# Patient Record
Sex: Female | Born: 1950 | Race: White | Hispanic: No | Marital: Married | Smoking: Never smoker
Health system: Southern US, Community
[De-identification: ages and names within clinical notes are randomized; demographics above are authoritative.]

## PROBLEM LIST (undated history)

## (undated) DIAGNOSIS — O00109 Unspecified tubal pregnancy without intrauterine pregnancy: Secondary | ICD-10-CM

## (undated) DIAGNOSIS — H9190 Unspecified hearing loss, unspecified ear: Secondary | ICD-10-CM

## (undated) DIAGNOSIS — I5030 Unspecified diastolic (congestive) heart failure: Secondary | ICD-10-CM

## (undated) DIAGNOSIS — Z8619 Personal history of other infectious and parasitic diseases: Secondary | ICD-10-CM

## (undated) DIAGNOSIS — M779 Enthesopathy, unspecified: Secondary | ICD-10-CM

## (undated) DIAGNOSIS — R0600 Dyspnea, unspecified: Secondary | ICD-10-CM

## (undated) DIAGNOSIS — I480 Paroxysmal atrial fibrillation: Secondary | ICD-10-CM

## (undated) DIAGNOSIS — E785 Hyperlipidemia, unspecified: Secondary | ICD-10-CM

## (undated) DIAGNOSIS — R251 Tremor, unspecified: Secondary | ICD-10-CM

## (undated) DIAGNOSIS — M549 Dorsalgia, unspecified: Secondary | ICD-10-CM

## (undated) DIAGNOSIS — I425 Other restrictive cardiomyopathy: Secondary | ICD-10-CM

## (undated) DIAGNOSIS — I4891 Unspecified atrial fibrillation: Secondary | ICD-10-CM

## (undated) DIAGNOSIS — G43909 Migraine, unspecified, not intractable, without status migrainosus: Secondary | ICD-10-CM

## (undated) DIAGNOSIS — Z7901 Long term (current) use of anticoagulants: Secondary | ICD-10-CM

## (undated) DIAGNOSIS — G4733 Obstructive sleep apnea (adult) (pediatric): Secondary | ICD-10-CM

## (undated) DIAGNOSIS — Z9989 Dependence on other enabling machines and devices: Secondary | ICD-10-CM

## (undated) HISTORY — DX: Other restrictive cardiomyopathy: I42.5

## (undated) HISTORY — PX: CARDIOVERSION: SHX1299

## (undated) HISTORY — DX: Enthesopathy, unspecified: M77.9

## (undated) HISTORY — DX: Obstructive sleep apnea (adult) (pediatric): G47.33

## (undated) HISTORY — DX: Migraine, unspecified, not intractable, without status migrainosus: G43.909

## (undated) HISTORY — DX: Unspecified atrial fibrillation: I48.91

## (undated) HISTORY — PX: ROTATOR CUFF REPAIR: SHX139

## (undated) HISTORY — DX: Unspecified tubal pregnancy without intrauterine pregnancy: O00.109

## (undated) HISTORY — PX: CARDIAC CATHETERIZATION: SHX172

## (undated) HISTORY — DX: Obstructive sleep apnea (adult) (pediatric): Z99.89

## (undated) HISTORY — DX: Tremor, unspecified: R25.1

## (undated) HISTORY — DX: Unspecified diastolic (congestive) heart failure: I50.30

## (undated) HISTORY — DX: Dyspnea, unspecified: R06.00

## (undated) HISTORY — DX: Paroxysmal atrial fibrillation: I48.0

## (undated) HISTORY — DX: Unspecified hearing loss, unspecified ear: H91.90

## (undated) HISTORY — DX: Personal history of other infectious and parasitic diseases: Z86.19

## (undated) HISTORY — DX: Long term (current) use of anticoagulants: Z79.01

## (undated) HISTORY — PX: BREAST EXCISIONAL BIOPSY: SUR124

## (undated) HISTORY — PX: CHOLECYSTECTOMY: SHX55

## (undated) HISTORY — PX: EYE SURGERY: SHX253

## (undated) HISTORY — DX: Hyperlipidemia, unspecified: E78.5

## (undated) HISTORY — DX: Dorsalgia, unspecified: M54.9

## (undated) HISTORY — PX: BREAST LUMPECTOMY: SHX2

---

## 1978-01-21 HISTORY — PX: ECTOPIC PREGNANCY SURGERY: SHX613

## 1997-06-13 ENCOUNTER — Ambulatory Visit (HOSPITAL_COMMUNITY): Admission: RE | Admit: 1997-06-13 | Discharge: 1997-06-14 | Payer: Self-pay | Admitting: Surgery

## 1998-11-16 ENCOUNTER — Encounter: Payer: Self-pay | Admitting: Obstetrics and Gynecology

## 1998-11-16 ENCOUNTER — Encounter: Admission: RE | Admit: 1998-11-16 | Discharge: 1998-11-16 | Payer: Self-pay | Admitting: Obstetrics and Gynecology

## 1998-11-24 ENCOUNTER — Encounter: Payer: Self-pay | Admitting: Obstetrics and Gynecology

## 1998-11-24 ENCOUNTER — Encounter: Admission: RE | Admit: 1998-11-24 | Discharge: 1998-11-24 | Payer: Self-pay | Admitting: Obstetrics and Gynecology

## 1998-12-06 ENCOUNTER — Other Ambulatory Visit: Admission: RE | Admit: 1998-12-06 | Discharge: 1998-12-06 | Payer: Self-pay | Admitting: General Surgery

## 1999-01-26 ENCOUNTER — Ambulatory Visit (HOSPITAL_COMMUNITY): Admission: RE | Admit: 1999-01-26 | Discharge: 1999-01-26 | Payer: Self-pay | Admitting: General Surgery

## 1999-01-26 ENCOUNTER — Encounter: Payer: Self-pay | Admitting: General Surgery

## 1999-01-26 ENCOUNTER — Encounter (INDEPENDENT_AMBULATORY_CARE_PROVIDER_SITE_OTHER): Payer: Self-pay | Admitting: *Deleted

## 2000-06-04 ENCOUNTER — Encounter: Payer: Self-pay | Admitting: Obstetrics and Gynecology

## 2000-06-04 ENCOUNTER — Encounter: Admission: RE | Admit: 2000-06-04 | Discharge: 2000-06-04 | Payer: Self-pay | Admitting: Obstetrics and Gynecology

## 2000-12-16 ENCOUNTER — Encounter: Admission: RE | Admit: 2000-12-16 | Discharge: 2001-03-16 | Payer: Self-pay | Admitting: Internal Medicine

## 2001-03-17 ENCOUNTER — Encounter: Admission: RE | Admit: 2001-03-17 | Discharge: 2001-03-30 | Payer: Self-pay | Admitting: Internal Medicine

## 2003-01-20 ENCOUNTER — Encounter: Admission: RE | Admit: 2003-01-20 | Discharge: 2003-01-20 | Payer: Self-pay | Admitting: Obstetrics and Gynecology

## 2003-02-10 ENCOUNTER — Ambulatory Visit (HOSPITAL_COMMUNITY): Admission: RE | Admit: 2003-02-10 | Discharge: 2003-02-10 | Payer: Self-pay | Admitting: Obstetrics and Gynecology

## 2003-06-22 HISTORY — PX: HYSTEROSCOPY: SHX211

## 2003-06-30 ENCOUNTER — Ambulatory Visit (HOSPITAL_COMMUNITY): Admission: RE | Admit: 2003-06-30 | Discharge: 2003-06-30 | Payer: Self-pay | Admitting: Obstetrics and Gynecology

## 2003-06-30 ENCOUNTER — Encounter (INDEPENDENT_AMBULATORY_CARE_PROVIDER_SITE_OTHER): Payer: Self-pay | Admitting: Specialist

## 2003-06-30 ENCOUNTER — Ambulatory Visit (HOSPITAL_BASED_OUTPATIENT_CLINIC_OR_DEPARTMENT_OTHER): Admission: RE | Admit: 2003-06-30 | Discharge: 2003-06-30 | Payer: Self-pay | Admitting: Obstetrics and Gynecology

## 2003-11-27 ENCOUNTER — Emergency Department (HOSPITAL_COMMUNITY): Admission: EM | Admit: 2003-11-27 | Discharge: 2003-11-27 | Payer: Self-pay | Admitting: Emergency Medicine

## 2004-04-18 ENCOUNTER — Encounter: Admission: RE | Admit: 2004-04-18 | Discharge: 2004-04-18 | Payer: Self-pay | Admitting: Obstetrics and Gynecology

## 2004-04-23 ENCOUNTER — Encounter: Admission: RE | Admit: 2004-04-23 | Discharge: 2004-04-23 | Payer: Self-pay | Admitting: Obstetrics and Gynecology

## 2005-04-19 ENCOUNTER — Encounter: Admission: RE | Admit: 2005-04-19 | Discharge: 2005-04-19 | Payer: Self-pay | Admitting: Obstetrics and Gynecology

## 2005-05-10 ENCOUNTER — Encounter: Admission: RE | Admit: 2005-05-10 | Discharge: 2005-05-10 | Payer: Self-pay | Admitting: Obstetrics and Gynecology

## 2005-10-22 ENCOUNTER — Ambulatory Visit: Payer: Self-pay | Admitting: Gastroenterology

## 2005-11-04 ENCOUNTER — Ambulatory Visit: Payer: Self-pay | Admitting: Gastroenterology

## 2006-01-21 HISTORY — PX: OTHER SURGICAL HISTORY: SHX169

## 2006-04-23 ENCOUNTER — Encounter: Admission: RE | Admit: 2006-04-23 | Discharge: 2006-04-23 | Payer: Self-pay | Admitting: Obstetrics and Gynecology

## 2006-09-17 ENCOUNTER — Inpatient Hospital Stay (HOSPITAL_COMMUNITY): Admission: AD | Admit: 2006-09-17 | Discharge: 2006-09-21 | Payer: Self-pay | Admitting: Cardiovascular Disease

## 2006-09-25 ENCOUNTER — Ambulatory Visit (HOSPITAL_COMMUNITY): Admission: RE | Admit: 2006-09-25 | Discharge: 2006-09-25 | Payer: Self-pay | Admitting: Cardiovascular Disease

## 2006-10-22 ENCOUNTER — Encounter: Payer: Self-pay | Admitting: Internal Medicine

## 2006-10-26 ENCOUNTER — Encounter: Payer: Self-pay | Admitting: Internal Medicine

## 2006-11-11 ENCOUNTER — Encounter: Payer: Self-pay | Admitting: Internal Medicine

## 2006-11-21 ENCOUNTER — Ambulatory Visit: Payer: Self-pay | Admitting: Internal Medicine

## 2006-11-26 ENCOUNTER — Ambulatory Visit: Payer: Self-pay

## 2006-12-04 LAB — TSH: TSH: 2.13 u[IU]/mL (ref <=5.90)

## 2006-12-16 ENCOUNTER — Telehealth (INDEPENDENT_AMBULATORY_CARE_PROVIDER_SITE_OTHER): Payer: Self-pay | Admitting: *Deleted

## 2006-12-17 ENCOUNTER — Encounter: Payer: Self-pay | Admitting: Internal Medicine

## 2006-12-17 ENCOUNTER — Ambulatory Visit (HOSPITAL_COMMUNITY): Admission: RE | Admit: 2006-12-17 | Discharge: 2006-12-17 | Payer: Self-pay | Admitting: Cardiovascular Disease

## 2006-12-17 ENCOUNTER — Ambulatory Visit: Payer: Self-pay | Admitting: Internal Medicine

## 2006-12-19 ENCOUNTER — Ambulatory Visit: Payer: Self-pay | Admitting: Internal Medicine

## 2006-12-27 DIAGNOSIS — I4819 Other persistent atrial fibrillation: Secondary | ICD-10-CM | POA: Insufficient documentation

## 2006-12-27 DIAGNOSIS — G43909 Migraine, unspecified, not intractable, without status migrainosus: Secondary | ICD-10-CM | POA: Insufficient documentation

## 2006-12-27 DIAGNOSIS — R0602 Shortness of breath: Secondary | ICD-10-CM | POA: Insufficient documentation

## 2006-12-27 DIAGNOSIS — I4891 Unspecified atrial fibrillation: Secondary | ICD-10-CM | POA: Insufficient documentation

## 2006-12-27 DIAGNOSIS — G2581 Restless legs syndrome: Secondary | ICD-10-CM | POA: Insufficient documentation

## 2007-01-13 ENCOUNTER — Telehealth: Payer: Self-pay | Admitting: Internal Medicine

## 2007-02-04 ENCOUNTER — Encounter: Payer: Self-pay | Admitting: Internal Medicine

## 2007-02-05 ENCOUNTER — Ambulatory Visit: Payer: Self-pay | Admitting: Internal Medicine

## 2007-02-05 LAB — CONVERTED CEMR LAB
Basophils Relative: 0.1 % (ref 0.0–1.0)
Eosinophils Absolute: 0.2 10*3/uL (ref 0.0–0.6)
HCT: 38.4 % (ref 36.0–46.0)
Lymphocytes Relative: 16.9 % (ref 12.0–46.0)
MCV: 87 fL (ref 78.0–100.0)
Neutro Abs: 9 10*3/uL — ABNORMAL HIGH (ref 1.4–7.7)
Neutrophils Relative %: 77.8 % — ABNORMAL HIGH (ref 43.0–77.0)
Platelets: 312 10*3/uL (ref 150–400)
RBC: 4.41 M/uL (ref 3.87–5.11)
WBC: 11.5 10*3/uL — ABNORMAL HIGH (ref 4.5–10.5)

## 2007-02-13 ENCOUNTER — Telehealth (INDEPENDENT_AMBULATORY_CARE_PROVIDER_SITE_OTHER): Payer: Self-pay | Admitting: *Deleted

## 2007-03-02 ENCOUNTER — Telehealth (INDEPENDENT_AMBULATORY_CARE_PROVIDER_SITE_OTHER): Payer: Self-pay | Admitting: *Deleted

## 2007-03-04 ENCOUNTER — Encounter: Payer: Self-pay | Admitting: Internal Medicine

## 2007-03-12 ENCOUNTER — Ambulatory Visit: Payer: Self-pay | Admitting: Internal Medicine

## 2007-03-12 LAB — CONVERTED CEMR LAB
Anti Nuclear Antibody(ANA): NEGATIVE
Basophils Relative: 1 % (ref 0.0–1.0)
Eosinophils Relative: 2.2 % (ref 0.0–5.0)
IgE (Immunoglobulin E), Serum: 2 intl units/mL (ref 0.0–180.0)
MCHC: 32.3 g/dL (ref 30.0–36.0)
Monocytes Relative: 5.5 % (ref 3.0–11.0)
RBC: 4.54 M/uL (ref 3.87–5.11)
Sed Rate: 56 mm/hr — ABNORMAL HIGH (ref 0–25)
WBC: 9.7 10*3/uL (ref 4.5–10.5)

## 2007-04-15 ENCOUNTER — Telehealth: Payer: Self-pay | Admitting: Internal Medicine

## 2007-04-24 ENCOUNTER — Encounter: Admission: RE | Admit: 2007-04-24 | Discharge: 2007-04-24 | Payer: Self-pay | Admitting: Internal Medicine

## 2007-05-01 ENCOUNTER — Encounter: Payer: Self-pay | Admitting: Internal Medicine

## 2007-05-15 ENCOUNTER — Encounter: Payer: Self-pay | Admitting: Endocrinology

## 2007-05-20 ENCOUNTER — Encounter: Payer: Self-pay | Admitting: Internal Medicine

## 2007-05-28 ENCOUNTER — Encounter (HOSPITAL_COMMUNITY): Admission: RE | Admit: 2007-05-28 | Discharge: 2007-07-22 | Payer: Self-pay | Admitting: Cardiovascular Disease

## 2007-08-11 ENCOUNTER — Encounter: Payer: Self-pay | Admitting: Internal Medicine

## 2007-09-07 ENCOUNTER — Encounter: Payer: Self-pay | Admitting: Internal Medicine

## 2007-10-22 ENCOUNTER — Ambulatory Visit (HOSPITAL_BASED_OUTPATIENT_CLINIC_OR_DEPARTMENT_OTHER): Admission: RE | Admit: 2007-10-22 | Discharge: 2007-10-22 | Payer: Self-pay | Admitting: Orthopedic Surgery

## 2008-04-25 ENCOUNTER — Encounter: Admission: RE | Admit: 2008-04-25 | Discharge: 2008-04-25 | Payer: Self-pay | Admitting: Internal Medicine

## 2008-05-24 ENCOUNTER — Other Ambulatory Visit: Admission: RE | Admit: 2008-05-24 | Discharge: 2008-05-24 | Payer: Self-pay | Admitting: Obstetrics & Gynecology

## 2008-11-10 DIAGNOSIS — R251 Tremor, unspecified: Secondary | ICD-10-CM | POA: Insufficient documentation

## 2009-04-27 ENCOUNTER — Encounter: Admission: RE | Admit: 2009-04-27 | Discharge: 2009-04-27 | Payer: Self-pay | Admitting: Obstetrics & Gynecology

## 2009-06-08 ENCOUNTER — Telehealth (INDEPENDENT_AMBULATORY_CARE_PROVIDER_SITE_OTHER): Payer: Self-pay | Admitting: *Deleted

## 2009-06-29 ENCOUNTER — Encounter: Admission: RE | Admit: 2009-06-29 | Discharge: 2009-06-29 | Payer: Self-pay | Admitting: Internal Medicine

## 2009-08-30 ENCOUNTER — Ambulatory Visit: Payer: Self-pay | Admitting: Cardiovascular Disease

## 2009-08-31 ENCOUNTER — Ambulatory Visit: Payer: Self-pay | Admitting: Cardiovascular Disease

## 2009-08-31 LAB — HEPATIC FUNCTION PANEL: Alkaline Phosphatase: 132 U/L — AB (ref 25–125)

## 2009-09-01 ENCOUNTER — Ambulatory Visit: Payer: Self-pay | Admitting: Cardiovascular Disease

## 2009-09-01 ENCOUNTER — Ambulatory Visit (HOSPITAL_COMMUNITY): Admission: RE | Admit: 2009-09-01 | Discharge: 2009-09-01 | Payer: Self-pay | Admitting: Cardiovascular Disease

## 2009-10-12 ENCOUNTER — Ambulatory Visit: Payer: Self-pay | Admitting: Internal Medicine

## 2009-10-12 DIAGNOSIS — I5032 Chronic diastolic (congestive) heart failure: Secondary | ICD-10-CM | POA: Insufficient documentation

## 2009-10-12 DIAGNOSIS — I5030 Unspecified diastolic (congestive) heart failure: Secondary | ICD-10-CM | POA: Insufficient documentation

## 2009-10-13 ENCOUNTER — Encounter: Payer: Self-pay | Admitting: Internal Medicine

## 2009-11-24 ENCOUNTER — Telehealth: Payer: Self-pay | Admitting: Internal Medicine

## 2009-12-11 ENCOUNTER — Telehealth: Payer: Self-pay | Admitting: Internal Medicine

## 2009-12-11 ENCOUNTER — Ambulatory Visit: Payer: Self-pay | Admitting: Cardiology

## 2009-12-11 ENCOUNTER — Inpatient Hospital Stay (HOSPITAL_COMMUNITY): Admission: AD | Admit: 2009-12-11 | Discharge: 2009-12-14 | Payer: Self-pay | Admitting: Internal Medicine

## 2010-01-02 ENCOUNTER — Ambulatory Visit: Payer: Self-pay | Admitting: Cardiovascular Disease

## 2010-02-11 ENCOUNTER — Encounter: Payer: Self-pay | Admitting: Obstetrics and Gynecology

## 2010-02-19 ENCOUNTER — Encounter: Payer: Self-pay | Admitting: Cardiovascular Disease

## 2010-02-19 DIAGNOSIS — G43909 Migraine, unspecified, not intractable, without status migrainosus: Secondary | ICD-10-CM | POA: Insufficient documentation

## 2010-02-19 DIAGNOSIS — E785 Hyperlipidemia, unspecified: Secondary | ICD-10-CM | POA: Insufficient documentation

## 2010-02-19 DIAGNOSIS — R0600 Dyspnea, unspecified: Secondary | ICD-10-CM | POA: Insufficient documentation

## 2010-02-19 DIAGNOSIS — Z7901 Long term (current) use of anticoagulants: Secondary | ICD-10-CM | POA: Insufficient documentation

## 2010-02-20 NOTE — Letter (Signed)
Summary: PFT  PFT   Imported By: Marylou Mccoy 12/27/2009 09:35:14  _____________________________________________________________________  External Attachment:    Type:   Image     Comment:   External Document

## 2010-02-20 NOTE — Progress Notes (Signed)
  Phone Note Other Incoming   Request: Send information Summary of Call: [place] medical release form received from patient request records from 2008-2011 be sent to Dr. Lonn Georgia. Request forwarded to [facility].

## 2010-02-20 NOTE — Assessment & Plan Note (Signed)
Summary: NEP. AFIB/ PT HAS BCBS.GD   Visit Type:  Initial Consult Primary Marylee Belzer:  [place]/ Nahser  CC:  no sob since cardioversion in August 2011....  History of Present Illness: Mrs. Waheed is seen at the request of Dr. Elease Hashimoto   for issues related to atrial fibrillation.  This first presented in 2009 and she ended up on Rhtymol which has been mostly ineffective.. Most recently she had worsening of her symptoms of dyspnea on exertion (see below) and she is found to be in atrial flutter. She was cardioverted with a marked improvement in her symptoms. She has had subsequent deterioration in her symptoms but she remains much improved still.  Her dyspnea has been a major issue. She has had extensive pulmonary evaluation for this without clear diagnosis. She underwent cardiopulmonary stress testing that showed some dead space ventilation she is. She is been followed by Dr. Maple Hudson and treated with a variety of inhalers largely without benefit.  At some point she was found to have diastolic dysfunction and underwent a biopsy at [facility] to exclude sarcoid. We have no further information available to [place] today as to her cardiac function or diagnoses.    Current Medications (verified): 1)  Lipitor 20 Mg  Tabs (Atorvastatin Calcium) .... Take 1 Tab By Mouth At Bedtime 2)  Topamax 50 Mg  Tabs (Topiramate) .... Take 1 By Mouth Once Daily 3)  Adult Aspirin Low Strength 81 Mg  Tbdp (Aspirin) .... Take 1 Tablet By Mouth Once A Day 4)  Furosemide 40 Mg  Tabs (Furosemide) .... Take 1 Tablet By Mouth Once A Day 5)  Pradaxa 150 Mg Caps (Dabigatran Etexilate Mesylate) .... Two Times A Day 6)  Potassium Chloride Cr 10 Meq Cr-Caps (Potassium Chloride) .... Take One Tablet By Mouth Daily  Allergies: 1)  ! Pcn 2)  ! Codeine  Past History:  Past Medical History: Last updated: 03/12/2007 Dyspnea Migraine Atrial fibrillation- paroxysmal diastolic dysfunction  Family History: Last updated: 10/11/2009 er  father has a history of hypertension.  Her mother   has a history of atrial fibrillation.   Social History: Last updated: 10/11/2009 The patient works as an Tax inspector.  She does   not smoke and does not drink alcohol.      Past Surgical History: colonoscopy shoulder surgery x2 Cholecystectomy Tubal pregnancy Breast lumpectomy  Vital Signs:  Patient profile:   60 year old female Height:      69 inches Weight:      231 pounds BMI:     34.24 Pulse rate:   70 / minute Pulse rhythm:   irregular BP sitting:   104 / 78  (left arm)  Vitals Entered By: Laurance Flatten CMA (October 12, 2009 5:24 PM)  Physical Exam  General:  Well developed, well nourished,middle-aged Caucasian female appearing her stated age in no acute distress. Head:  normal HEENT Neck:  flat neck veins carotid upstrokes normal neck supple Chest Wall:  without kyphosis or scoliosis Lungs:  clear to auscultation Heart:  regular rate and rhythm with an S4 Abdomen:  soft nontender without hepatomegaly or midline pulsation Msk:  normal muscle strength and no skeletal abnormalities Pulses:  intact pulses Extremities:  no clubbing cyanosis or edema Neurologic:  alert and oriented and grossly normal motor and sensory function Skin:  warm and dry Cervical Nodes:  without adenopathy Psych:  intact affect   EKG  Procedure date:  10/12/2009  Findings:      sinus rhythm at 70 Intervals 0.13/2007/0.40  Axis is 50 Otherwise normal  EKG  Procedure date:  09/01/2009  Findings:      atypical atrial flutter with a cycle length of 320 ms with negative flutter waves in lead V1 23 and F.  Impression & Recommendations:  Problem # 1:  ATRIAL FIBRILLATION (ICD-427.31) the patient has had symptomatic recurrent atrial fibrillation. Apparently he has left ventricular hypertrophy which makes a 1C agent contraindicated.  She has some recollection that she was on amiodarone in the past.  Thus our best options  for her would include Tikosyn. Her QT interval is fairly short enough to allow this. I would also think about catheter ablation. However, given the issues of diastolic dysfunction and question restrictive cardiomyopathy the potential benefits of PVI may be lessened and I would try another drug first. Her updated medication list for this problem includes:    Adult Aspirin Low Strength 81 Mg Tbdp (Aspirin) .[name]... Take 1 tablet by mouth once a day  Problem # 2:  DYSPNEA (ICD-786.05) her dyspnea is a major issue. Is not clearly whether this is cardiac or not. I I wonder whether having her see Dr.kitzman at [facility] not be of value to try to understand the contribution of her diastolic dysfunction to her symptom complex. I will also seek to get Dr.DB  input on this. Her updated medication list for this problem includes:    Adult Aspirin Low Strength 81 Mg Tbdp (Aspirin) .[name]... Take 1 tablet by mouth once a day    Furosemide 40 Mg Tabs (Furosemide) .[name]... Take 1 tablet by mouth once a day  Patient Instructions: 1)  Will call after Dr. Graciela Husbands consults with Dr. Melburn Popper for possible admit for Tikosyn load and possible referals.

## 2010-02-20 NOTE — Letter (Signed)
Summary: [facility] - Cath   Imported By: Marylou Mccoy 12/27/2009 09:42:20  _____________________________________________________________________  External Attachment:    Type:   Image     Comment:   External Document

## 2010-02-20 NOTE — Letter (Signed)
Summary: [name] - New Patient [facility] - New Patient Eval   Imported By: Marylou Mccoy 12/27/2009 09:38:22  _____________________________________________________________________  External Attachment:    Type:   Image     Comment:   External Document

## 2010-02-20 NOTE — Miscellaneous (Signed)
  Clinical Lists Changes  Medications: Added new medication of METOPROLOL SUCCINATE 50 MG XR24H-TAB (METOPROLOL SUCCINATE) Take one tablet by mouth daily Added new medication of AMITRIPTYLINE HCL 50 MG TABS (AMITRIPTYLINE HCL) once daily Allergies: Added new allergy or adverse reaction of * OXYCODONE Observations: Added new observation of ALLERGY REV: Done (10/13/2009 16:24)

## 2010-02-20 NOTE — Progress Notes (Signed)
Summary: re tikosyn  Phone Note Call from Patient   Caller: Patient [phone] can be reached after 510p or leave msg w/husband Reason for Call: Talk to Nurse Summary of Call: pt calling re tikosyn , thought she was to be scheduled to have it done at the hospital and hasn't heard anything since her visit 9-22 Initial call taken by: Glynda Jaeger,  November 24, 2009 4:32 PM  Follow-up for Phone Call        I called and spoke with pt's husband regarding hospital admission for Tikosyn load. On the last MD note it states: DR. Graciela Husbands WOULD CONSULT WITH DR. Melburn Popper FOR POSSIBLE TIKOSYN LOAD. I let pt. know will send this message to the  MD's desktop. She/he will probable be call  next week. Pt's husband verbalized understanding. Follow-up by: Ollen Gross, RN, BSN,  November 24, 2009 5:15 PM  Additional Follow-up for Phone Call Additional follow up Details #1::        line busy 11/27/09 0900 Claris Gladden, RN, BSN spoke w/Ms. Gunkel and told her that Tikosyn may not be the first option and that Dr. Graciela Husbands may discuss her case w/other MD's. Will f/u w/him this pm and adv pt tomorrow.  Additional Follow-up by: Claris Gladden RN,  November 27, 2009 9:31 AM    Additional Follow-up for Phone Call Additional follow up Details #2::    Per Dr. Graciela Husbands proceed w/Tikosyn load. Ms. Gheen will review dates and discuss w/husband and let me know on Monday.  Follow-up by: Claris Gladden RN,  November 30, 2009 3:28 PM  Additional Follow-up for Phone Call Additional follow up Details #3:: Details for Additional Follow-up Action Taken: 11/14 1014 [id number] Claris Gladden, RN, BSN date scheduled for 11/21 Additional Follow-up by: Claris Gladden RN,  December 04, 2009 12:47 PM

## 2010-02-20 NOTE — Progress Notes (Signed)
Summary: to go to hospital today-when?  Phone Note Call from Patient   Caller: Patient [phone] Reason for Call: Talk to Nurse Summary of Call: pt to go to hopital today and hasn't heard from them, calling to get info on when to be there Initial call taken by: Glynda Jaeger,  December 11, 2009 9:30 AM  Follow-up for Phone Call        adv pt would try and find out something.  Follow-up by: Claris Gladden RN,  December 11, 2009 10:15 AM  Additional Follow-up for Phone Call Additional follow up Details #1::        [facility] admitting adv [name] is first one up to get a bed when one becomes available. Adv pt of info. No ETA  Additional Follow-up by: Claris Gladden RN,  December 11, 2009 10:34 AM

## 2010-04-04 LAB — BASIC METABOLIC PANEL
BUN: 16 mg/dL (ref 6–23)
BUN: 18 mg/dL (ref 6–23)
Calcium: 9 mg/dL (ref 8.4–10.5)
Calcium: 9.1 mg/dL (ref 8.4–10.5)
Chloride: 111 mEq/L (ref 96–112)
Creatinine, Ser: 0.92 mg/dL (ref 0.4–1.2)
Creatinine, Ser: 1.06 mg/dL (ref 0.4–1.2)
GFR calc Af Amer: >60 mL/min (ref >60)
GFR calc Af Amer: >60 mL/min (ref >60)
GFR calc non Af Amer: 53 mL/min — ABNORMAL LOW (ref >60)
GFR calc non Af Amer: 55 mL/min — ABNORMAL LOW (ref >60)
GFR calc non Af Amer: 59 mL/min — ABNORMAL LOW (ref >60)
Glucose, Bld: 95 mg/dL (ref 70–99)
Glucose, Bld: 95 mg/dL (ref 70–99)
Glucose, Bld: 96 mg/dL (ref 70–99)
Potassium: 3.5 mEq/L (ref 3.5–5.1)
Potassium: 3.8 mEq/L (ref 3.5–5.1)
Sodium: 140 mEq/L (ref 135–145)
Sodium: 140 mEq/L (ref 135–145)
Sodium: 141 mEq/L (ref 135–145)
Sodium: 141 mEq/L (ref 135–145)

## 2010-04-04 LAB — MAGNESIUM
Magnesium: 2.3 mg/dL (ref 1.5–2.5)
Magnesium: 2.4 mg/dL (ref 1.5–2.5)

## 2010-04-06 ENCOUNTER — Ambulatory Visit: Payer: Self-pay | Admitting: Cardiovascular Disease

## 2010-04-12 ENCOUNTER — Other Ambulatory Visit: Payer: Self-pay | Admitting: Cardiovascular Disease

## 2010-04-12 ENCOUNTER — Ambulatory Visit (INDEPENDENT_AMBULATORY_CARE_PROVIDER_SITE_OTHER): Payer: BC Managed Care – PPO | Admitting: Cardiovascular Disease

## 2010-04-12 ENCOUNTER — Encounter: Payer: Self-pay | Admitting: Cardiovascular Disease

## 2010-04-12 ENCOUNTER — Other Ambulatory Visit: Payer: Self-pay | Admitting: Obstetrics & Gynecology

## 2010-04-12 VITALS — BP 120/82 | HR 80 | Wt 235.0 lb

## 2010-04-12 DIAGNOSIS — I4891 Unspecified atrial fibrillation: Secondary | ICD-10-CM

## 2010-04-12 DIAGNOSIS — I509 Heart failure, unspecified: Secondary | ICD-10-CM

## 2010-04-12 DIAGNOSIS — E785 Hyperlipidemia, unspecified: Secondary | ICD-10-CM

## 2010-04-12 DIAGNOSIS — I519 Heart disease, unspecified: Secondary | ICD-10-CM

## 2010-04-12 DIAGNOSIS — I5189 Other ill-defined heart diseases: Secondary | ICD-10-CM

## 2010-04-12 DIAGNOSIS — R0609 Other forms of dyspnea: Secondary | ICD-10-CM

## 2010-04-12 DIAGNOSIS — Z7901 Long term (current) use of anticoagulants: Secondary | ICD-10-CM

## 2010-04-12 DIAGNOSIS — I5032 Chronic diastolic (congestive) heart failure: Secondary | ICD-10-CM

## 2010-04-12 DIAGNOSIS — Z1231 Encounter for screening mammogram for malignant neoplasm of breast: Secondary | ICD-10-CM

## 2010-04-12 DIAGNOSIS — R0600 Dyspnea, unspecified: Secondary | ICD-10-CM

## 2010-04-12 DIAGNOSIS — R0989 Other specified symptoms and signs involving the circulatory and respiratory systems: Secondary | ICD-10-CM

## 2010-04-12 NOTE — Assessment & Plan Note (Signed)
We will continue with her same medications. She is a little bit confused about her potassium dosing. She'll continue with her current dose of Lasix and we will check a basic metabolic profile today.

## 2010-04-12 NOTE — Assessment & Plan Note (Signed)
She will continue on Pradaxa. She is tolerating this very well.

## 2010-04-12 NOTE — Patient Instructions (Signed)
Continue same medications.  Please call with potassium dose if it not 20 meq twice a day.

## 2010-04-12 NOTE — Assessment & Plan Note (Signed)
Have asked her to exercise on a regular basis. She would improve with weight loss.

## 2010-04-12 NOTE — Assessment & Plan Note (Signed)
We will check a lipid profile and CMET with her next visit in 6 months.

## 2010-04-12 NOTE — Progress Notes (Signed)
History of Present Illness:  [name] is a middle-aged female with the history of chronic dyspnea, H. Fibrillation, diastolic heart failure, mild obesity, and restrictive lung disease. She's not had any severe problems recently. She has stayed in sinus rhythm and is doing quite well on Tikosyn.  Current Outpatient Prescriptions on File Prior to Visit  Medication Sig Dispense Refill  . Albuterol (PROVENTIL IN) Inhale into the lungs as needed.        [name] aspirin 81 MG tablet Take 81 mg by mouth daily.        [name] atorvastatin (LIPITOR) 20 MG tablet Take 20 mg by mouth daily.        . Dabigatran Etexilate Mesylate (PRADAXA) 150 MG CAPS Take 150 mg by mouth every 12 (twelve) hours.        . dofetilide (TIKOSYN) 125 MCG capsule Take 375 mcg by mouth 2 (two) times daily.        . Ergotamine-Caffeine (CAFERGOT PO) Take by mouth as needed.        . ergotamine-caffeine (CAFERGOT) 1-100 MG per tablet Take 2 tablets by mouth as needed. Two tablets at onset of attack; then 1 tablet every 30 minutes as needed; maximum: 6 tablets per attack; do not exceed 10 tablets/week       . furosemide (LASIX) 40 MG tablet Take 40 mg by mouth 2 (two) times daily.        . metoclopramide (REGLAN) 10 MG tablet Take 10 mg by mouth as needed.        . potassium chloride SA (K-DUR,KLOR-CON) 20 MEQ tablet Take 20 mEq by mouth 2 (two) times daily.        [name] topiramate (TOPAMAX) 25 MG capsule Take 25 mg by mouth daily.          Allergies  Allergen Reactions  . Codeine     VERTIGO, NAUSEA & VOMITTING  . Oxycodone   . Penicillins Rash    Past Medical History  Diagnosis Date  . Paroxysmal atrial fibrillation   . Migraine headache   . Dyspnea   . Restrictive cardiomyopathy   . Diastolic congestive heart failure   . Hyperlipemia   . Chronic anticoagulation     Past Surgical History  Procedure Date  . Cardiac catheterization   . Cardioversion   . Rotator cuff repair   . Ectopic pregnancy surgery   . Cholecystectomy      History  Smoking status  . Never Smoker   Smokeless tobacco  . Not on file    History  Alcohol Use: Not on file    Family History  Problem Relation Age of Onset  . Hypertension Father   . Atrial fibrillation Mother     Review of Systems: The patient denies any heat or cold intolerance.  No weight gain or weight loss.  The patient denies headaches or blurry vision.  There is no cough or sputum production.  The patient denies dizziness.  There is no hematuria or hematochezia.  The patient denies any muscle aches or arthritis.  The patient denies any rash.  The patient denies frequent falling or instability.  There is no history of depression or anxiety.  All other systems were reviewed and are negative.  Physical Exam: BP 120/82  Pulse 80  Wt 235 lb (106.595 kg) The patient is alert and oriented x 3.  The mood and affect are normal.  The skin is warm and dry.  Color is normal.  The HEENT exam reveals that  the sclera are nonicteric.  The mucous membranes are moist.  The carotids are 2+ without bruits.  There is no thyromegaly.  There is no JVD.  The lungs are clear.  The chest wall is non tender.  The heart exam reveals a regular rate with a normal S1 and S2.  There is a soft murmur.  The PMI is not displaced.   Abdominal exam reveals good bowel sounds.  There is no guarding or rebound.  There is no hepatosplenomegaly or tenderness.  There are no masses.  Exam of the legs reveal no clubbing, cyanosis, or edema.  The legs are without rashes.  The distal pulses are intact.  Cranial nerves II - XII are intact.  Motor and sensory functions are intact.  The gait is normal.  ECG: Normal sinus rhythm. The QT C. Is 429 ms.  Assessment / Plan:

## 2010-04-12 NOTE — Assessment & Plan Note (Signed)
[name] is doing quite well from an atrial fibrillation standpoint. She's tolerated Tikosyn  quite well and her EKG is normal. We will continue with this same medications

## 2010-04-13 LAB — BASIC METABOLIC PANEL
BUN: 23 mg/dL (ref 6–23)
Calcium: 9.6 mg/dL (ref 8.4–10.5)
Creat: 0.99 mg/dL (ref 0.40–1.20)
Potassium: 4.8 mEq/L (ref 3.5–5.3)

## 2010-04-30 ENCOUNTER — Ambulatory Visit
Admission: RE | Admit: 2010-04-30 | Discharge: 2010-04-30 | Disposition: A | Discharge status: Home / Self Care | Payer: BC Managed Care – PPO | Source: Ambulatory Visit | Attending: Obstetrics & Gynecology | Admitting: Obstetrics & Gynecology

## 2010-04-30 DIAGNOSIS — Z1231 Encounter for screening mammogram for malignant neoplasm of breast: Secondary | ICD-10-CM

## 2010-06-05 NOTE — Assessment & Plan Note (Signed)
[facility]                             PULMONARY OFFICE NOTE   NAME:BURNEYVerlin, [name]                  MRN:          [id number]  DATE:11/21/2006                            DOB:          1950/06/15    PROBLEM:  Pulmonary consultation at the kind request of Gwen Pounds,  M.D. for this 60 year old woman who complains of chest pain and  shortness of breath.  She says for five years she has been in and out of  atrial fibrillation treated by Vesta Mixer, M.D. and on Coumadin.  In August of this year, she was in atrial fibrillation through most of  the month and spontaneously converted just as she was about to go for  cardioversion.  She has remained weaker and more short of breath and she  thinks it is usual for her.  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  She was referred to [facility] for a heart  biopsy where they also did a sleep study.  She said she had minimal  sleep apnea and did not qualify for CPAP.  Her main concern is the  persistent weakness and shortness of breath.  CT scans with and without  contrast were done at [facility].  The contrast CT study done September  4, showed no active disease.  This was not a pulmonary embolism  protocol.  They described resolution of pericardial effusion and pleural  effusions which had been present on August 29, when she was in atrial  fibrillation.  She says another heart catheterization and biopsy done at  [facility] were normal.  She took that to mean there was nothing wrong  with her heart and I pointed out the simple fact that she had been going  in and out of atrial fibrillation over the years and that things were  not quite normal.  She is for pulmonary evaluation now because of  shortness of breath, chest pain, and fatigue, all new since August.  She  says her oxygen saturation dropped when she was  retaining fluid.  She  tends to rapidly retain fluid if she misses Lasix and feels this as a  heavy sensation in the chest.   MEDICATIONS:  1. Lipitor 20 mg.  2. Topamax 100 mg.  3. Aspirin 81 mg.  4. Warfarin.  5. Furosemide 40 mg.  6. Nadolol is taken p.r.n. and currently she is off of it.  7. Cafergot every four hours p.r.n. migraine.  8. Metoclopramide 10 mg.   ALLERGIES:  PENICILLIN, CODEINE.  No problems with latex, contrast dye,  or aspirin.   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  She does not wake at night  smothered, but prefers to sleep in a recliner if she is at all  uncomfortable.  Otherwise, she will sleep on one pillow.  This heavy  smothering sensation is what she means by chest pain.  Occasional  palpitation.  Occasional indigestion.  She is not sure  about acid  indigestion, weight change, or anxiety.   PAST MEDICAL HISTORY:  Atrial fibrillation, chronic migraine, very mild  sleep apnea with restless leg syndrome diagnosed at [facility].  No history  of asthma or previous lung disease.  No history of coronary disease.  No  history of deep venous thrombosis or clotting disorder, leg fracture, or  thyroid disease.  Surgery for cholecystectomy and shoulder.   SOCIAL HISTORY:  Never smoked.  Married, no children, lives with  husband.  Works as an Tax inspector.   FAMILY HISTORY:  Nobody with clotting disorder or lung disease.  Mother  and sister with atrial fibrillation.  Father with hypertension.  Sister  had mastectomy.  Grandmother had cancer of mouth.   OBJECTIVE:  VITAL SIGNS:  Weight 225 pounds.  Blood pressure 116/72,  pulse 60, room air saturation 98%.  GENERAL:  This is a heavy woman quite alert in no evident distress.  SKIN:  No evident rash.  ADENOPATHY:  None found.  HEENT:  Palate spacing 3-4/4.  No stridor or thyromegaly.  CHEST:  Quiet, clear lung fields.  HEART:  Sounds  regular without murmur or gallops.  NECK:  No neck vein distention, stridor, peripheral edema, cyanosis, or  clubbing.   VACCINE STATUS:  She does not know about Pneumococcal vaccine.  Has had  flu shot.   PHYSICAL EXAMINATION:  VITAL SIGNS:  Today weight 225 pounds, blood  pressure 116/72, pulse 60 and regular, room air saturation 98%.   IMPRESSION:  1. Dyspnea which she dates to her most recent episode of atrial      fibrillation.  Unless she has had a pulmonary embolism, it is not      clear why there should have been a connection as long as she has      not been retaining more fluid than usual or had exercise tolerance      suppressed by medication.  2. Paroxysmal atrial fibrillation which is associated with pericardial      effusion and pleural effusion, supporting impression of a heart      failure component at that time.  3. She has questioned a developing varix in the left calf, though it      does not bother her much and we talked about the importance of      excluding pulmonary embolism even though there is no direct      evidence.  4. We questioned the exercise tolerance reduction attributable to      nadolol and she is going to discuss that with her physicians to see      whether it is permissible now to stop.   PLAN:  1. D-dimer and Doppler of leg veins rather than repeating another CT      scan at this time.  2. Pulmonary function test to include six-minute walk test.  3. She will ask her physicians about her thyroid status.  4. Come off of nadolol if possible to assess impact on exercise      tolerance and exertional dyspnea.  5. She will schedule return for follow-up after these tests are      completed and try to walk some for endurance.     Clinton D. Maple Hudson, MD, Tonny Bollman, FACP  Electronically Signed    CDY/MedQ  DD: 11/23/2006  DT: 11/24/2006  Job #: [id number]   cc:   Gwen Pounds, MD  Vesta Mixer, M.D.

## 2010-06-05 NOTE — H&P (Signed)
NAMEKYLEN, ISMAEL NO.:  [id number]   MEDICAL RECORD NO.:  [id number]           PATIENT TYPE:   LOCATION:                                 FACILITY:   PHYSICIAN:  Vesta Mixer, M.D.      DATE OF BIRTH:   DATE OF ADMISSION:  DATE OF DISCHARGE:                              HISTORY & PHYSICAL   [name] is a middle-aged female with a history of intermittent  atrial fibrillation.  She is now admitted for TE cardioversion after  having sustained atrial fibrillation.   [name] is a middle-aged female who has been in relatively good health.  She has had intermittent atrial fibrillation for years.  She recently  developed atrial fibrillation; and has not converted to normal despite  the beta blocker therapy.  She is getting to the point where she is  having lots of fatigue; and really has not tolerated the atrial  fibrillation that well at all.  We performed an echocardiogram several  days ago which revealed normal left ventricular systolic function.  She  was found to have a trace amount of tricuspid regurgitation.  She is now  admitted for TE cardioversion.   CURRENT MEDICATIONS:  1. Corgard 40 mg a day.  2. Aspirin 81 mg a day.  3. Lipitor 20 mg a day.  4. Metoprolol 25 mg p.o. b.i.d.  5. Coumadin 5 mg three days a week and 2.5 mg four days a week.   ALLERGIES:  She is allergic to CODEINE and PENICILLIN.   PAST MEDICAL HISTORY:  1. Atrial fibrillation.  2. Hyperlipidemia.  3. Migraine headaches.   SOCIAL HISTORY:  The patient is a nonsmoker.   FAMILY HISTORY:  Unremarkable.   REVIEW OF SYSTEMS:  Reviewed and is essentially negative except as noted  in the HPI.   PHYSICAL EXAMINATION:  GENERAL:  On exam she is a middle-aged female in  no acute distress.  She is alert and oriented x3; and her mood and  affect are normal.  VITAL SIGNS:  Her weight is 238.  Her blood pressure is 124/70 with a  heart rate of 87.  HEENT AND NECK:  Exam reveals 2+  carotids.  She has  no bruits, no JVD, no thyromegaly.  LUNGS:  Clear.  HEART:  Irregularly irregular.  ABDOMINAL:  Exam reveals good bowel sounds and is nontender.  EXTREMITIES:  She has no clubbing, cyanosis or edema.  NEUROLOGIC:  Exam is nonfocal.   Her EKG reveals atrial fibrillation with a controlled ventricular  response.  She has nonspecific ST-and-T wave changes.   IMPRESSION:  [name] presents with continued atrial fibrillation with a  controlled ventricular response.  She is not tolerating this well at  all.  I would like to go ahead and admit her for outpatient TEE  cardioversion.  We will also go ahead and start on Rythmol SR 325 mg  twice a day.  Hopefully she will convert even prior to the  cardioversion.  We will see her back in the office in a week or two for  a stress Cardiolite study.  ______________________________  Vesta Mixer, M.D.     PJN/MEDQ  D:  09/10/2006  T:  09/11/2006  Job:  [id number]   cc:   Gwen Pounds, MD

## 2010-06-05 NOTE — Op Note (Signed)
[name], [name] NO.:  [id number]   MEDICAL RECORD NO.:  [id number]          PATIENT TYPE:  AMB   LOCATION:  DSC                          FACILITY:  [facility]   PHYSICIAN:  Loreta Ave, M.D. DATE OF BIRTH:  06/06/1950   DATE OF PROCEDURE:  10/22/2007  DATE OF DISCHARGE:                               OPERATIVE REPORT   PREOPERATIVE DIAGNOSIS:  Right shoulder impingement, distal clavicle  osteolysis, labral tear.   POSTOPERATIVE DIAGNOSES:  1. Right shoulder impingement, distal clavicle osteolysis, labral      tear.  2. Moderate secondary adhesive capsulitis.   PROCEDURES:  Right shoulder examination, manipulation under anesthesia.  Arthroscopy with debridement of labrum and rotator cuff.  Acromioplasty,  coracoacromial ligament release.  Excision distal clavicle.   SURGEON:  Loreta Ave, MD   ASSISTANT:  Genene Churn. Barry Dienes, [place]   ANESTHESIA:  General.   BLOOD LOSS:  Minimal.   SPECIMENS:  None.   CONSULTS:  None.   COMPLICATIONS:  None.   DRESSING:  Soft compressive with sling.   PROCEDURE:  The patient was brought to the operating room and after  adequate anesthesia had been obtained, right shoulder examined.  She had  about 30-40% restricted motion, especially on overhead with flexion and  abduction.  Obvious adhesions.  Some limitation of internal rotation as  well.  The shoulder was gently manipulated breaking up all adhesions  achieving full motion, maintaining a stable shoulder.  She was then  placed in a beach-chair position, and the shoulder positioner and  prepped and draped in usual sterile fashion.  Three standard portals,  anterior, posterior, and lateral.  Shoulder entered with blunt  obturator.  Arthroscope introduced, shoulder distended and inspected.  Complex tearing of anterior and posterior labrum debrided.  Capsular  tearing of the bottom from manipulation evident.  Synovitis and  adhesions in the shoulder debrided.   Glenohumeral joint itself otherwise  looked reasonably good with not much in the way of degenerative changes.  Undersurface of the cuff and the biceps tendon and remaining capsular  ligamentous structures were intact.  Cannula redirected subacromially.  Moderate to marked adhesive bursitis debrided.  Abrasive changes on the  top of the cuff consistent with impingement.  Type 2 acromion.  Bursa  resected and cuff debrided.  The CA ligament released.  Acromioplasty to  a type 1 acromion with shaver and high-speed bur.  Distal clavicle grade  4 changes with marked inferior spurring.  Periarticular spurs and  lateral centimeter of clavicle resected with shaver and high-speed bur.  Adequacy of debridement with clavicle excision.  Acromioplasty confirmed viewing from all portals.  Instruments and fluid  removed.  Portals were injected with Marcaine and closed with nylon.  Sterile compressive dressing applied.  Sling applied.  Anesthesia  reversed.  Brought to recovery room.  Tolerated surgery well.  No  complications.      Loreta Ave, M.D.  Electronically Signed     DFM/MEDQ  D:  10/22/2007  T:  10/23/2007  Job:  [id number]

## 2010-06-05 NOTE — Discharge Summary (Signed)
NAMEJANVI, AMMAR NO.:  [id number]   MEDICAL RECORD NO.:  [id number]          PATIENT TYPE:  INP   LOCATION:  3737                         FACILITY:  [facility]   PHYSICIAN:  Vesta Mixer, M.D. DATE OF BIRTH:  [birth year]   DATE OF ADMISSION:  09/17/2006  DATE OF DISCHARGE:  09/21/2006                               DISCHARGE SUMMARY   DISCHARGE DIAGNOSES:  1. Atrial fibrillation/atrial flutter.  2. Restrictive cardiomyopathy.  3. Congestive heart failure secondary to restrictive cardiomyopathy.  4. Hyperlipidemia   DISCHARGE MEDICATIONS:  1. Aspirin 81 mg daily.  2. Corgard 40 mg at night.  3. Lasix 40 mg a day.  4. Lipitor 20 mg a day.  5. Coumadin.  The patient was to resume her home dose starting      tomorrow.   DISPOSITION:  The patient will see Dr. Elease Hashimoto in the office in 1-2  weeks.   HISTORY:  Ms. Thu Baggett is a 60 year old female who was admitted  to hospital for workup of severe shortness of breath.  Please see  dictated H&P for further details.  The patient had a recent history of  atrial fibrillation/atrial flutter.  She was originally brought to the  hospital to undergo a TEE cardioversion.  When she presented to the  endoscopy suite, she was found to be normal rhythm, but she was still  having profound shortness breath, in fact could only walk short  distances before getting completely out of breath.  We admitted her to  the hospital with these findings.   HOSPITAL COURSE BY PROBLEM:  1. Cardiomyopathy.  The patient underwent heart catheterization after      getting her Coumadin reversed.  She was found to have smooth and      normal coronary arteries.  She was found to have impaired diastolic      filling.  In fact, it looked most consistent with a restrictive      cardiomyopathy.  She was found to have significant elevation of her      right-sided heart pressures.  The patient was diuresed and we      continued the Corgard.   She felt quite a bit better over the next      several days.  2. Atrial fibrillation.  The patient converted to sinus rhythm on her      own.  We will continue the Coumadin.  I do not think this had      anything to do with her cardiomyopathy.  She in fact had normal      left ventricular systolic      function and had normal cardiac output.  3. Dyspnea.  This was thought to be due to her restrictive      cardiomyopathy.  We will continue to follow this in the office.      All of her other medical problems are stable.           ______________________________  Vesta Mixer, M.D.     PJN/MEDQ  D:  01/26/2007  T:  01/27/2007  Job:  [id number]   cc:   [name]  Bo Mcclintock, MD

## 2010-06-05 NOTE — Assessment & Plan Note (Signed)
[facility]                             PULMONARY OFFICE NOTE   NAME:BURNEYMarillyn, Goren                  MRN:          [id number]  DATE:12/19/2006                            DOB:          Jan 18, 1951    PROBLEMS:  1. Dyspnea.  2. History of paroxysmal atrial fibrillation.   HISTORY:  She says that she has been doing better with more energy using  Xopenex HFA inhaler, and she is sleeping better at night. This week  again, she fell back into the pattern of subxyphoid or left parasternal  pain, decreased energy, and mood. She describes the chest pain as  exertional. Her Doppler studies had shown no evidence of venous disease  in the legs. Dr. Elease Hashimoto indicated that on echocardiogram, her right  ventricular size and pressure were not elevated, and she has maintained  sinus rhythm off of Core Guard. Chest CT at the end of the August had  been done at a time when she was in atrial fibrillation and apparently  fluid overloaded.   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 Her FEV1 was 81% of predicted. Measured lung volumes  were normal. Diffusion was reduced at 64%. We have lab results from the  time when she appeared to be in mild heart failure in August at [facility], and I am not sure that those are representative of her current  status. On her six minute walk test on October 31 here, she had gone 455  meters in six minutes, holding oxygen saturation on room air at 99%  throughout. Blood pressure went from 106/56 to 112/64, and two minutes  later was 114/66. Heart rate went from 66 to 82 and two minutes later  was 64. These seem unremarkable. Yesterday, she had a cardiopulmonary  exercise test at [facility] ordered by Dr. Elease Hashimoto with no results  available yet.   She insists that the xyphoid area pain is mainly exertional and is not  affected by food and is relieved only by resting in her recliner. I am  not sure if it represents an esophageal spasm.   OBJECTIVE:  Weight 224 pounds, blood pressure 106/64, pulse 81, room air  saturation 99%. Slight essential tremor. Obesity. Breathing is unlabored  with mouth closed. Pharynx is clear. There is no neck vein distension or  strider. Lung fields sound clear. Heart sounds are regular now without  murmur or gallop. Extremities are without cyanosis, clubbing, or edema.   IMPRESSION:  1. Dyspnea mainly with exertion. A cardiopulmonary stress test was      appropriate. Otherwise, I can demonstrate only mild small airways      obstructive disease with some reduction of diffusion capacity which      may reflect her obesity, but it is non-specific.  2. Left parasternal pain mainly with exertion, uncertain etiology, for      continued assessment by Dr. Elease Hashimoto.   PLAN:  1. Xopenex HFA rescue inhaler 2 puffs q.i.d. p.r.n. for refillable  prescription.  2. Try adding Advair 100/50 using it b.i.d. if tolerated. We want to      see what maintenance bronchodilator therapy will do. Hopefully, it      will not aggravate her heart rhythm, but this was discussed.  3. Await results of cardiopulmonary stress test.  4. Schedule return 2 to 3 months.     Clinton D. Maple Hudson, MD, Tonny Bollman, FACP  Electronically Signed    CDY/MedQ  DD: 12/20/2006  DT: 12/21/2006  Job #: [id number]   cc:   Gwen Pounds, MD  Vesta Mixer, M.D.

## 2010-06-08 NOTE — Op Note (Signed)
NAME:  [name], [name]                     ACCOUNT NO.:  [id number]   MEDICAL RECORD NO.:  [id number]                   PATIENT TYPE:  AMB   LOCATION:  NESC                                 FACILITY:  [facility]   PHYSICIAN:  Katherine Roan, M.D.               DATE OF BIRTH:  10-Jan-1951   DATE OF PROCEDURE:  06/30/2003  DATE OF DISCHARGE:                                 OPERATIVE REPORT   PREOPERATIVE DIAGNOSES:  1. Postmenopausal bleeding.  2. Abnormal ultrasound.  3. Rule out endometrial adenocarcinoma.  4. Cervical stenosis.   POSTOPERATIVE DIAGNOSES:  1. Postmenopausal bleeding.  2. Abnormal ultrasound.  3. Rule out endometrial adenocarcinoma.  4. Cervical stenosis.   OPERATION/PROCEDURE:  1. Pelvic examination under anesthesia.  2. Cervical biopsy to relieve cervical stenosis.  3. Dilatation and diagnostic hysteroscopy with biopsy and curettage.   DESCRIPTION OF PROCEDURE:  The patient was placed in the lithotomy position,  general anesthesia instituted without difficulty.  The uterus was anterior.  No masses were noted in the adnexa.  The patient was then prepped and draped  in the usual fashion, a weighted speculum placed posteriorly.  The cervix  injected with a Pitressin solution.  The cervix was quite stenotic so I  decided to do a cervical biopsy which amounted to just an excision of the  cervical os which opened up the cervix quite nicely and we were able to look  in the uterine cavity and take a biopsy of a lower segment polyp along with  a thorough curettage of the endometrial cavity.  The tissue removed appeared  to be a little bit hyperplastic.  No unusual blood loss occurred.  Specimens  forwarded to the lab were cervical biopsy, endometrial biopsy and curettage.  The patient tolerated the procedure well with minimal blood loss and was  sent to the recovery room in good condition.                                               Katherine Roan, M.D.    SDM/MEDQ  D:  06/30/2003  T:  06/30/2003  Job:  [id number]

## 2010-06-08 NOTE — Cardiovascular Report (Signed)
NAMETAMIKIA, CHOWNING NO.:  [id number]   MEDICAL RECORD NO.:  [id number]           PATIENT TYPE:   LOCATION:                                 FACILITY:   PHYSICIAN:  Vesta Mixer, M.D. DATE OF BIRTH:  Jun 10, 1950   DATE OF PROCEDURE:  DATE OF DISCHARGE:                            CARDIAC CATHETERIZATION   Record number [id number].  She also has a second medical record number,  [id number].  You may need to file this under both of those medical record  numbers.   [name] is a middle-aged female with a history of atrial  fibrillation.  She presented to the hospital with episodes of severe  shortness of breath.  She is referred for heart catheterization for  further evaluation.   PROCEDURES:  Right and left heart catheterization, coronary angiography  with hemodynamic monitoring.   The right femoral artery and right femoral vein were easily cannulated  using modified Seldinger technique.   HEMODYNAMICS:  The right atrial pressure is 20/20 with a mean of 16.  Right ventricular pressure is 38/70 with a mean of 17.  Pulmonary artery  pressure is 39/19 with a mean of 28.  Pulmonary capillary wedge pressure  is 23.   Simultaneous recording of the LV and RV reveal a concordance of  pressures, which vary with respiration.   The cardiac output by thermodilution is 4.3 L/min. with an index of 1.95  L/min.  Cardiac index by Fick calculation is 3.7 L/min. with a cardiac  index of 1.67 L/min.   The pulmonary artery saturation is 59% with an aortic saturation of 92%.   Left ventricular pressure is 112/20 with an aortic pressure of 114/66  with a mean of 88.   ANGIOGRAPHY:  Left main:  The left main is fairly smooth and normal.   The left anterior descending artery is smooth and normal throughout its  course.  The ramus intermediate branch is smooth and normal.   There are several small diagonal branches, which are normal.   The left circumflex artery is a  moderate-sized vessel.  The first obtuse  marginal artery is fairly normal.  The terminal circumflex artery ends  as a posterolateral branch which is normal.   The right coronary artery is moderate-sized and is dominant.  It is  smooth and normal throughout its course.  The posterior descending  artery is normal.  The posterolateral branches are normal.   The left ventriculogram was performed in the 30 RAO position.  It  reveals normal left ventricular systolic function.  The ejection  fraction is around 65-70%.  There is no mitral regurgitation.   COMPLICATIONS:  None.   CONCLUSION:  1. Mildly elevated left ventricular end-diastolic pressure, RA      pressure, RV end-diastolic pressure and PA diastolic pressure.  She      has concordance of her RV and LV tracings.  This is most consistent      with a restrictive cardiomyopathy.  2. Smooth and normal coronary arteries.  3. Normal left ventricular systolic function.           ______________________________  Vesta Mixer, M.D.     PJN/MEDQ  D:  09/30/2006  T:  10/01/2006  Job:  [id number]

## 2010-07-10 ENCOUNTER — Encounter: Payer: Self-pay | Admitting: Cardiovascular Disease

## 2010-10-18 ENCOUNTER — Encounter: Payer: Self-pay | Admitting: Cardiovascular Disease

## 2010-10-18 ENCOUNTER — Encounter: Payer: Self-pay | Admitting: *Deleted

## 2010-10-18 ENCOUNTER — Ambulatory Visit (INDEPENDENT_AMBULATORY_CARE_PROVIDER_SITE_OTHER): Payer: BC Managed Care – PPO | Admitting: Cardiovascular Disease

## 2010-10-18 DIAGNOSIS — I519 Heart disease, unspecified: Secondary | ICD-10-CM

## 2010-10-18 DIAGNOSIS — I509 Heart failure, unspecified: Secondary | ICD-10-CM

## 2010-10-18 DIAGNOSIS — I4891 Unspecified atrial fibrillation: Secondary | ICD-10-CM

## 2010-10-18 DIAGNOSIS — I5032 Chronic diastolic (congestive) heart failure: Secondary | ICD-10-CM

## 2010-10-18 NOTE — Assessment & Plan Note (Signed)
Stable

## 2010-10-18 NOTE — Patient Instructions (Addendum)
Try to start an exercise program.      .

## 2010-10-18 NOTE — Progress Notes (Signed)
Leonia Reader Date of Birth  06/17/1950 [facility] [address]    [address] [place], [place]  [address] [phone]  Fax  [phone]  History of Present Illness:  60 year old female with a history of atrial fibrillation. She's been well controlled on Tikosyn. She also has a history of hyperlipidemia and is on Lipitor. She's been on chronic Pradaxa therapy for anticoagulation.  Current Outpatient Prescriptions on File Prior to Visit  Medication Sig Dispense Refill  . aspirin 81 MG tablet Take 81 mg by mouth daily.        [name] atorvastatin (LIPITOR) 20 MG tablet Take 20 mg by mouth daily.        . Dabigatran Etexilate Mesylate (PRADAXA) 150 MG CAPS Take 150 mg by mouth every 12 (twelve) hours.        . dofetilide (TIKOSYN) 125 MCG capsule Take 375 mcg by mouth 2 (two) times daily.        . furosemide (LASIX) 40 MG tablet Take 40 mg by mouth 2 (two) times daily.        . potassium chloride SA (K-DUR,KLOR-CON) 20 MEQ tablet Take 20 mEq by mouth 2 (two) times daily.          Allergies  Allergen Reactions  . Codeine     VERTIGO, NAUSEA & VOMITTING  . Oxycodone   . Penicillins Rash    Past Medical History  Diagnosis Date  . Paroxysmal atrial fibrillation   . Migraine headache   . Dyspnea   . Restrictive cardiomyopathy   . Diastolic congestive heart failure   . Hyperlipemia   . Chronic anticoagulation     Past Surgical History  Procedure Date  . Cardiac catheterization   . Cardioversion   . Rotator cuff repair   . Ectopic pregnancy surgery   . Cholecystectomy     History  Smoking status  . Never Smoker   Smokeless tobacco  . Not on file    History  Alcohol Use: Not on file    Family History  Problem Relation Age of Onset  . Hypertension Father   . Atrial fibrillation Mother     Reviw of Systems:  Reviewed in the HPI.  All other systems are negative.  Physical Exam: BP 126/82  Pulse 62  Ht 5\' 9"  (1.753 m)  Wt 233 lb 6.4 oz (105.87 kg)   BMI 34.47 kg/m2 The patient is alert and oriented x 3.  The mood and affect are normal.   Skin: warm and dry.  Color is normal.    HEENT:   the sclera are nonicteric.  The mucous membranes are moist.  The carotids are 2+ without bruits.  There is no thyromegaly.  There is no JVD.    Lungs: clear.  The chest wall is non tender.    Heart: regular rate with a normal S1 and S2.  There are no murmurs, gallops, or rubs. The PMI is not displaced.     Abdomen: good bowel sounds.  There is no guarding or rebound.  There is no hepatosplenomegaly or tenderness.  There are no masses.   Extremities:  no clubbing, cyanosis, or edema.  The legs are without rashes.  The distal pulses are intact.   Neuro:  Cranial nerves II - XII are intact.  Motor and sensory functions are intact.    The gait is normal.  ECG:  Assessment / Plan:

## 2010-10-18 NOTE — Assessment & Plan Note (Addendum)
Maintaining NSR.  Continue current dose of tikosyn.

## 2010-10-18 NOTE — Assessment & Plan Note (Signed)
She has overall done fairly well. She has not been exercising. I've encouraged her to exercise and to work on a good diet and exercise program. She does not have any symptoms of congestive heart failure at this time.

## 2010-10-22 LAB — BASIC METABOLIC PANEL
Calcium: 9.5
GFR calc Af Amer: >60
GFR calc non Af Amer: >60
Sodium: 140

## 2010-10-22 LAB — PROTIME-INR
INR: 1
Prothrombin Time: 13.3

## 2010-10-22 LAB — APTT: aPTT: 27

## 2010-11-02 LAB — CK TOTAL AND CKMB (NOT AT ARMC)
CK, MB: 1.3
CK, MB: 1.7
Relative Index: INVALID
Total CK: 40
Total CK: 50

## 2010-11-02 LAB — CBC
HCT: 36.2
Hemoglobin: 11.9 — ABNORMAL LOW
MCHC: 33.8
MCHC: 33.9
MCV: 87.4
Platelets: 241
RBC: 4.03
RDW: 13.9

## 2010-11-02 LAB — BASIC METABOLIC PANEL
BUN: 14
BUN: 9
CO2: 26
Calcium: 8.8
Chloride: 107
GFR calc Af Amer: >60
GFR calc non Af Amer: >60
GFR calc non Af Amer: >60
Glucose, Bld: 93
Glucose, Bld: 95
Potassium: 3.7
Potassium: 4
Sodium: 139

## 2010-11-02 LAB — POCT I-STAT 3, VENOUS BLOOD GAS (G3P V)
Acid-base deficit: 1
Bicarbonate: 24.2 — ABNORMAL HIGH
TCO2: 25

## 2010-11-02 LAB — POCT I-STAT 3, ART BLOOD GAS (G3+)
Bicarbonate: 22
O2 Saturation: 92
TCO2: 23
pCO2 arterial: 36.4
pO2, Arterial: 63 — ABNORMAL LOW

## 2010-11-02 LAB — PROTIME-INR
INR: 1.7 — ABNORMAL HIGH
INR: 2.5 — ABNORMAL HIGH
Prothrombin Time: 28.2 — ABNORMAL HIGH

## 2010-11-02 LAB — TROPONIN I: Troponin I: 0.02

## 2010-11-16 ENCOUNTER — Telehealth: Payer: Self-pay | Admitting: Cardiovascular Disease

## 2010-11-16 NOTE — Telephone Encounter (Signed)
Pt has a cold and needs to know something she can take that will not interfere with her meds

## 2010-11-19 NOTE — Telephone Encounter (Signed)
I was out for afternoon, apologized for no answer that day to her question. I left a msg stating no pseudoephedrine  decongestants. Told her to ask pharmacist for specific brand but no pseudoephedrine. Told to call with further needs.

## 2010-12-23 ENCOUNTER — Other Ambulatory Visit: Payer: Self-pay | Admitting: Cardiovascular Disease

## 2010-12-25 ENCOUNTER — Telehealth: Payer: Self-pay | Admitting: Cardiovascular Disease

## 2010-12-25 NOTE — Telephone Encounter (Signed)
Pt needs refill called in for pradaxa to [facility] for 90 day supply

## 2010-12-26 MED ORDER — DABIGATRAN ETEXILATE MESYLATE 150 MG PO CAPS: 150.0000 mg | ORAL_CAPSULE | Freq: Two times a day (BID) | ORAL | Status: DC

## 2010-12-26 NOTE — Telephone Encounter (Signed)
Fax Received. Refill Completed. Kaymen Adrian Chowoe (R.M.A)   

## 2011-01-27 ENCOUNTER — Other Ambulatory Visit: Payer: Self-pay | Admitting: Cardiovascular Disease

## 2011-01-29 ENCOUNTER — Encounter: Payer: Self-pay | Admitting: Cardiovascular Disease

## 2011-02-03 ENCOUNTER — Other Ambulatory Visit: Payer: Self-pay | Admitting: Cardiovascular Disease

## 2011-02-04 NOTE — Telephone Encounter (Signed)
Fax Received. Refill Completed. Mariska Daffin Chowoe (R.M.A)   

## 2011-03-01 ENCOUNTER — Telehealth: Payer: Self-pay | Admitting: *Deleted

## 2011-03-01 MED ORDER — POTASSIUM CHLORIDE CRYS ER 20 MEQ PO TBCR: EXTENDED_RELEASE_TABLET | ORAL | Status: DC

## 2011-03-01 NOTE — Telephone Encounter (Signed)
Pt's wife rtn call, pls call back, [phone] before 1130 if poss

## 2011-03-01 NOTE — Telephone Encounter (Signed)
CONFIRMED PT TAKING K+ TWO TABLET BID, CALLED DR RUSSO OFFICE PT HAD K+ CHECKED AND ON 01/29/11 K+ = 3.7 PER OFFICE AND PT CONFIRMED DOSE TAKEN, REFILL COMPLETED AND F/U APP MADE.

## 2011-03-01 NOTE — Telephone Encounter (Signed)
MSG LEFT TO CALL ME BACK, PER HUSBAND NEEDS REFILL OF POTASSIUM, NEED TO VERIFY DOSE AND NEEDS LAB WORK PRIOR/ AT LEAST A BMET WITH POSS HEPATIC AND LIPIDS, WILL WAIT FOR RETURN CALL.

## 2011-04-05 DIAGNOSIS — H3530 Unspecified macular degeneration: Secondary | ICD-10-CM | POA: Insufficient documentation

## 2011-04-05 DIAGNOSIS — I429 Cardiomyopathy, unspecified: Secondary | ICD-10-CM | POA: Insufficient documentation

## 2011-04-05 DIAGNOSIS — H2010 Chronic iridocyclitis, unspecified eye: Secondary | ICD-10-CM | POA: Insufficient documentation

## 2011-04-10 ENCOUNTER — Encounter: Payer: Self-pay | Admitting: Cardiovascular Disease

## 2011-04-10 ENCOUNTER — Ambulatory Visit (INDEPENDENT_AMBULATORY_CARE_PROVIDER_SITE_OTHER): Payer: BC Managed Care – PPO | Admitting: Cardiovascular Disease

## 2011-04-10 VITALS — BP 118/71 | HR 70 | Ht 69.5 in | Wt 237.8 lb

## 2011-04-10 DIAGNOSIS — I509 Heart failure, unspecified: Secondary | ICD-10-CM

## 2011-04-10 DIAGNOSIS — Z7901 Long term (current) use of anticoagulants: Secondary | ICD-10-CM

## 2011-04-10 DIAGNOSIS — I5032 Chronic diastolic (congestive) heart failure: Secondary | ICD-10-CM

## 2011-04-10 DIAGNOSIS — I4891 Unspecified atrial fibrillation: Secondary | ICD-10-CM

## 2011-04-10 NOTE — Assessment & Plan Note (Addendum)
[name]  is doing well. She's currently on Tikosyn. She's not had any recurrent atrial fibrillation. Her basic metabolic profile was normal during her last lab draw   on 01/29/2011.

## 2011-04-10 NOTE — Progress Notes (Signed)
[name] Date of Birth  [birth year] [facility] [address]    [address] [place], [place]  [address] [phone]  Fax  [phone]  History of Present Illness:  61 year old female with a history of atrial fibrillation. She's been well controlled on Tikosyn. She also has a history of hyperlipidemia and is on Lipitor. She's been on chronic Pradaxa therapy for anticoagulation.  She will be retiring this fall.  Current Outpatient Prescriptions on File Prior to Visit  Medication Sig Dispense Refill  . aspirin 81 MG tablet Take 81 mg by mouth daily.        [name] atorvastatin (LIPITOR) 20 MG tablet Take 20 mg by mouth daily.        . dabigatran (PRADAXA) 150 MG CAPS Take 1 capsule (150 mg total) by mouth 2 (two) times daily.  180 capsule  3  . furosemide (LASIX) 40 MG tablet TAKE 1 TABLET TWICE A DAY  180 tablet  3  . potassium chloride SA (K-DUR,KLOR-CON) 20 MEQ tablet TAKE TWO TABLETS TWICE DAILY  480 tablet  1  . TIKOSYN 125 MCG capsule TAKE 3 CAPSULES TWICE A DAY  540 capsule  3    Allergies  Allergen Reactions  . Codeine     VERTIGO, NAUSEA & VOMITTING  . Oxycodone   . Penicillins Rash    Past Medical History  Diagnosis Date  . Paroxysmal atrial fibrillation   . Migraine headache   . Dyspnea   . Restrictive cardiomyopathy   . Diastolic congestive heart failure   . Hyperlipemia   . Chronic anticoagulation     Past Surgical History  Procedure Date  . Cardiac catheterization   . Cardioversion   . Rotator cuff repair   . Ectopic pregnancy surgery   . Cholecystectomy     History  Smoking status  . Never Smoker   Smokeless tobacco  . Not on file    History  Alcohol Use: Not on file    Family History  Problem Relation Age of Onset  . Hypertension Father   . Atrial fibrillation Mother     Reviw of Systems:  Reviewed in the HPI.  All other systems are negative.  Physical Exam: BP 118/71  Pulse 70  Ht 5' 9.5" (1.765 m)  Wt 237 lb 12.8 oz  (107.865 kg)  BMI 34.61 kg/m2 The patient is alert and oriented x 3.  The mood and affect are normal.   Skin: warm and dry.  Color is normal.    HEENT:   the sclera are nonicteric.  The mucous membranes are moist.  The carotids are 2+ without bruits.  There is no thyromegaly.  There is no JVD.    Lungs: clear.  The chest wall is non tender.    Heart: regular rate with a normal S1 and S2.  There are no murmurs, gallops, or rubs. The PMI is not displaced.     Abdomen: good bowel sounds.  There is no guarding or rebound.  There is no hepatosplenomegaly or tenderness.  There are no masses.   Extremities:  Trace ankle edema.  The legs are without rashes.  The distal pulses are intact.   Neuro:  Cranial nerves II - XII are intact.  Motor and sensory functions are intact.    The gait is normal.  ECG: NSR, low voltage QRS, QT corrected is 439 ms  Assessment / Plan:

## 2011-04-10 NOTE — Assessment & Plan Note (Signed)
She has trace edema in her ankles. She's they say she may have had something little bit salty over the past day or so. She'll continue to watch her salt and to exercise on regular basis.

## 2011-04-10 NOTE — Patient Instructions (Signed)
Your physician wants you to follow-up in: 6 months  You will receive a reminder letter in the mail two months in advance. If you don't receive a letter, please call our office to schedule the follow-up appointment.  Your physician recommends that you return for a FASTING lipid profile: 6 months or fax to [place] at  [facility] rn [phone]

## 2011-04-10 NOTE — Assessment & Plan Note (Signed)
She'll continue her Pradaxa.

## 2011-05-28 ENCOUNTER — Other Ambulatory Visit: Payer: Self-pay | Admitting: Obstetrics & Gynecology

## 2011-05-28 DIAGNOSIS — Z1231 Encounter for screening mammogram for malignant neoplasm of breast: Secondary | ICD-10-CM

## 2011-06-12 ENCOUNTER — Ambulatory Visit
Admission: RE | Admit: 2011-06-12 | Discharge: 2011-06-12 | Disposition: A | Discharge status: Home / Self Care | Payer: BC Managed Care – PPO | Source: Ambulatory Visit | Attending: Obstetrics & Gynecology | Admitting: Obstetrics & Gynecology

## 2011-06-12 DIAGNOSIS — Z1231 Encounter for screening mammogram for malignant neoplasm of breast: Secondary | ICD-10-CM

## 2011-08-18 ENCOUNTER — Other Ambulatory Visit: Payer: Self-pay | Admitting: Cardiovascular Disease

## 2011-08-19 NOTE — Telephone Encounter (Signed)
Fax Received. Refill Completed. [name] (R.M.A)   

## 2011-09-20 ENCOUNTER — Other Ambulatory Visit: Payer: Self-pay | Admitting: Gynecology

## 2011-09-20 DIAGNOSIS — R922 Inconclusive mammogram: Secondary | ICD-10-CM

## 2011-09-20 DIAGNOSIS — Z803 Family history of malignant neoplasm of breast: Secondary | ICD-10-CM

## 2011-10-07 ENCOUNTER — Other Ambulatory Visit: Payer: BC Managed Care – PPO

## 2011-11-05 ENCOUNTER — Other Ambulatory Visit: Payer: Self-pay | Admitting: Cardiovascular Disease

## 2011-11-22 IMAGING — US US EXTREM LOW VENOUS*R*
1 series · 14 of 24 positions shown · non-contrast
Comparison: None.

CLINICAL DATA: 59-year-old with right lower extremity pain.
Evaluate for DVT.

RIGHT LOWER EXTREMITY VENOUS DUPLEX ULTRASOUND
TECHNIQUE: Gray-scale sonography with graded compression, as well
as color Doppler and duplex ultrasound were performed to evaluate
the deep venous system of the lower extremity from the level of the
common femoral vein through the popliteal and proximal calf veins.
Spectral Doppler was utilized to evaluate flow at rest and with
distal augmentation maneuvers.

[Series 1: us extrem low venous*right* · 14 of 26 slices shown]
[im 1/26]
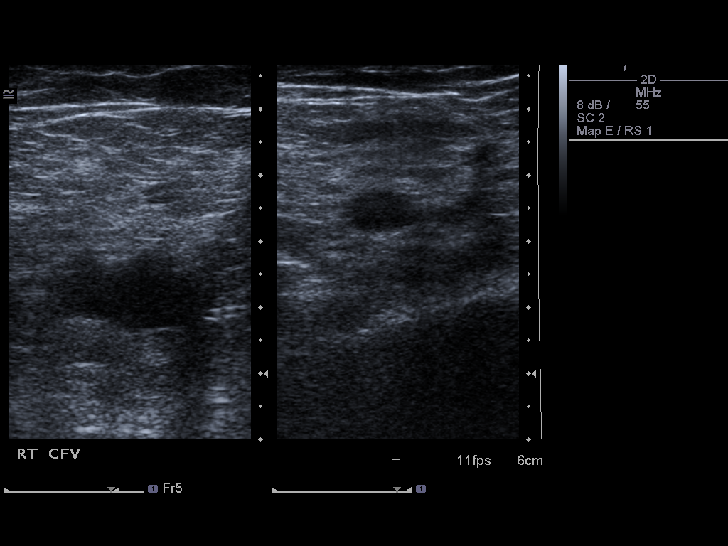
[im 3/26]
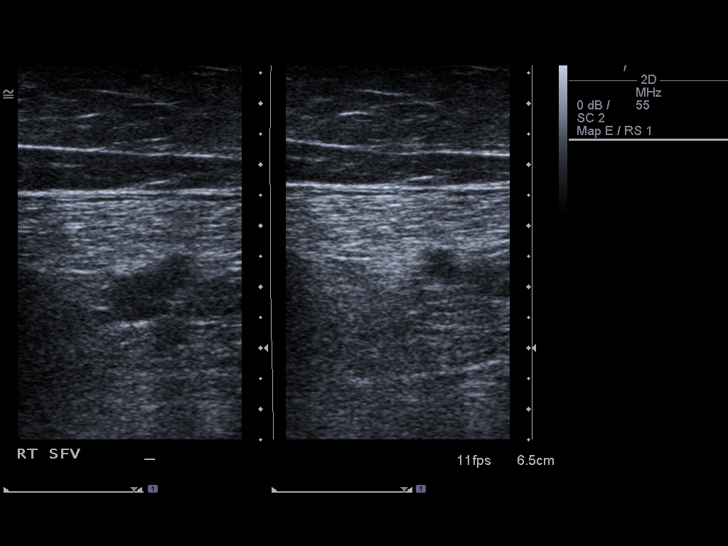
[im 5/26]
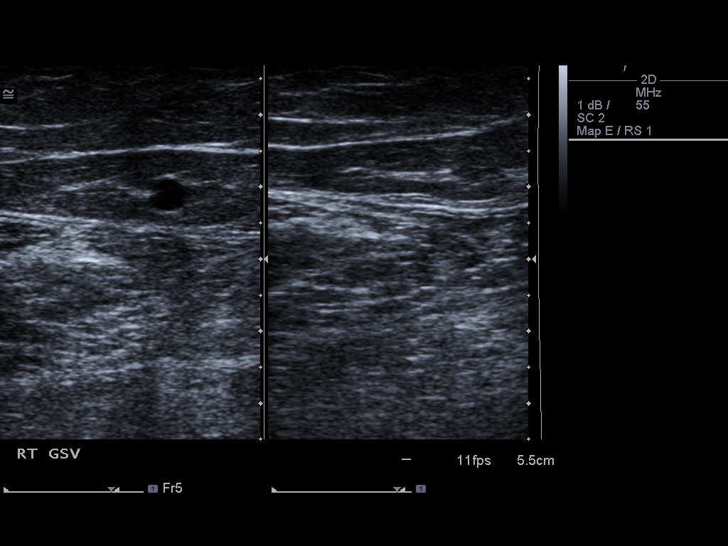
[im 7/26]
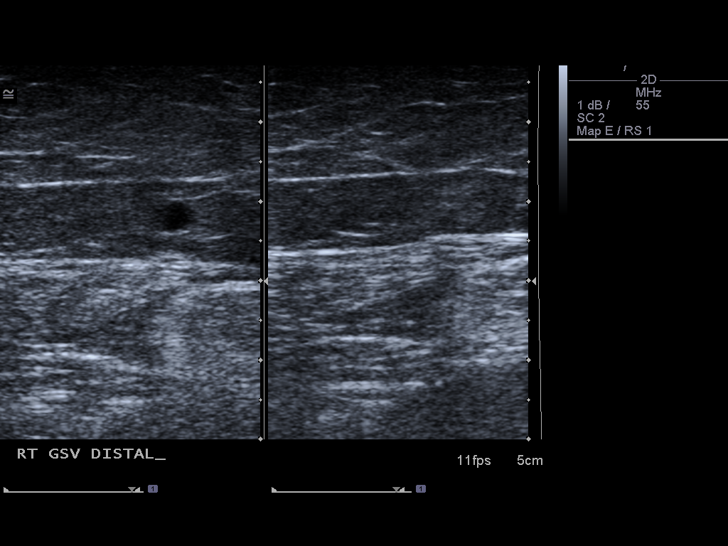
[im 8/26]
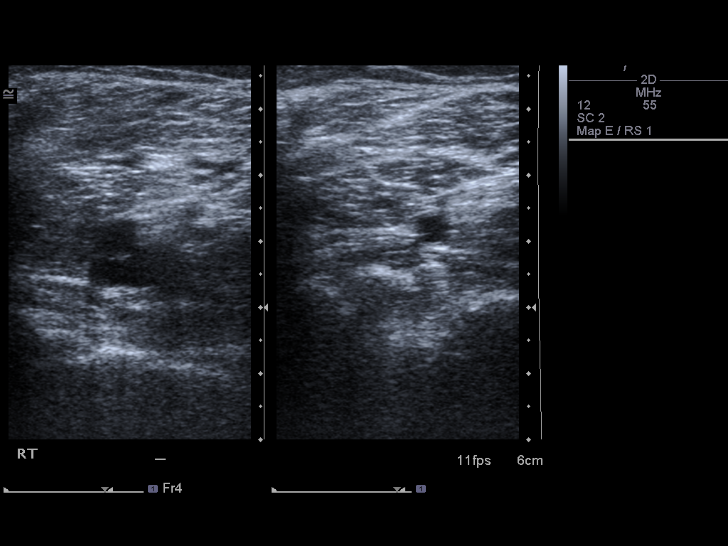
[im 10/26]
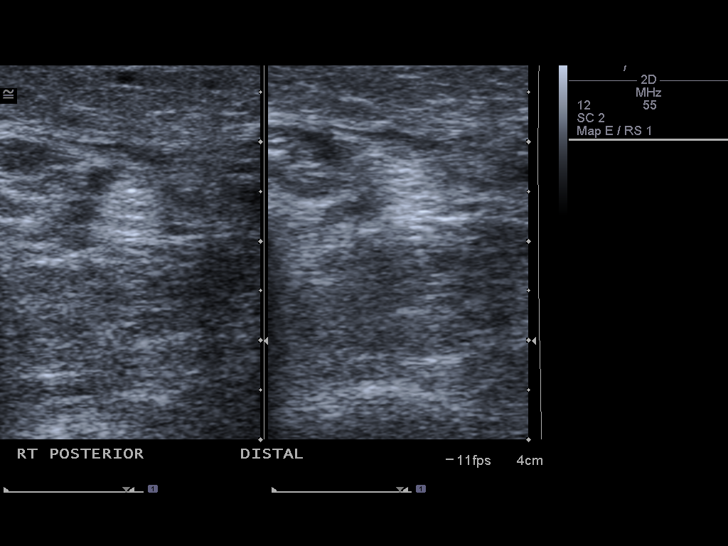
[im 12/26]
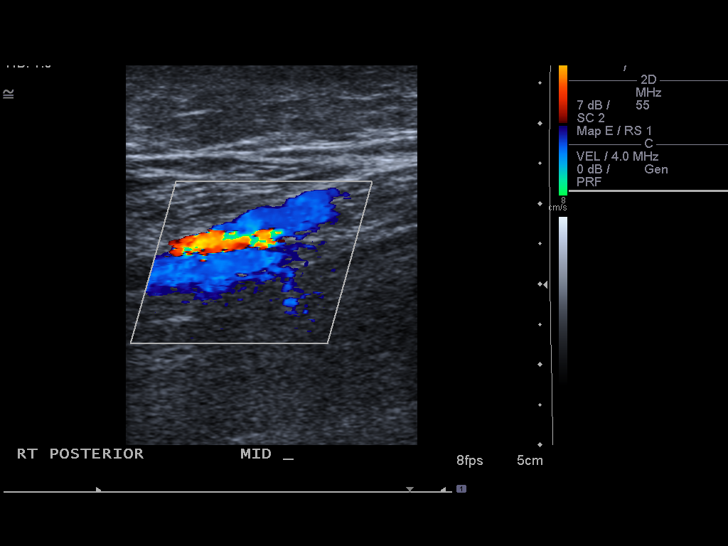
[im 14/26]
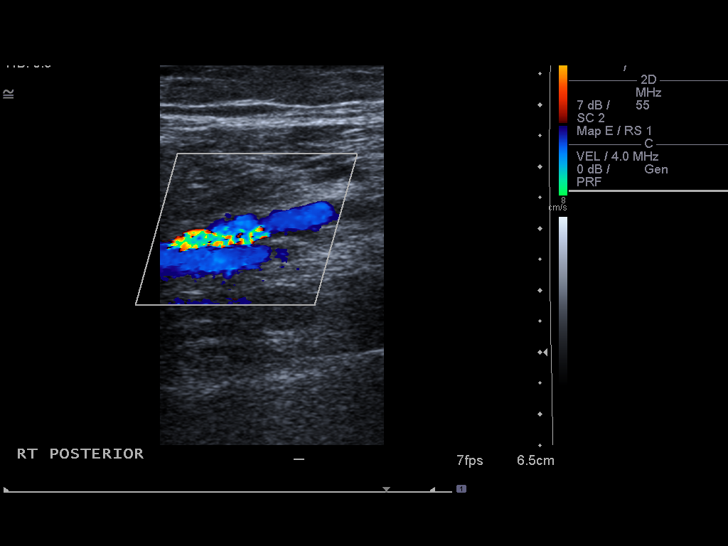
[im 16/26]
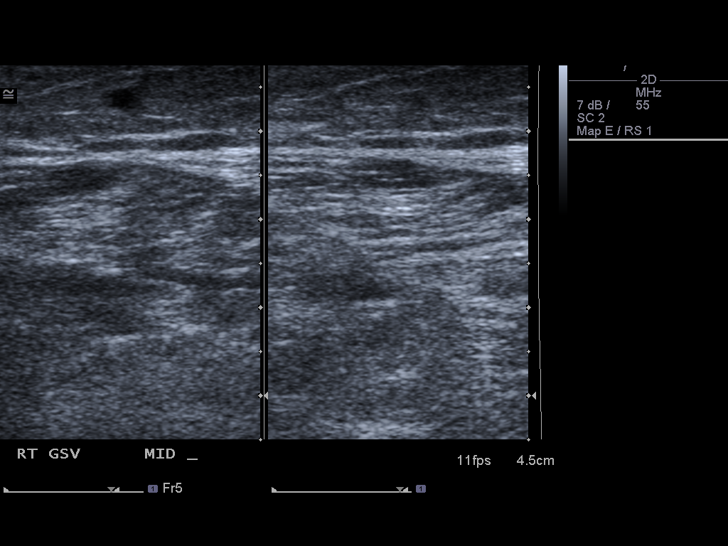
[im 18/26]
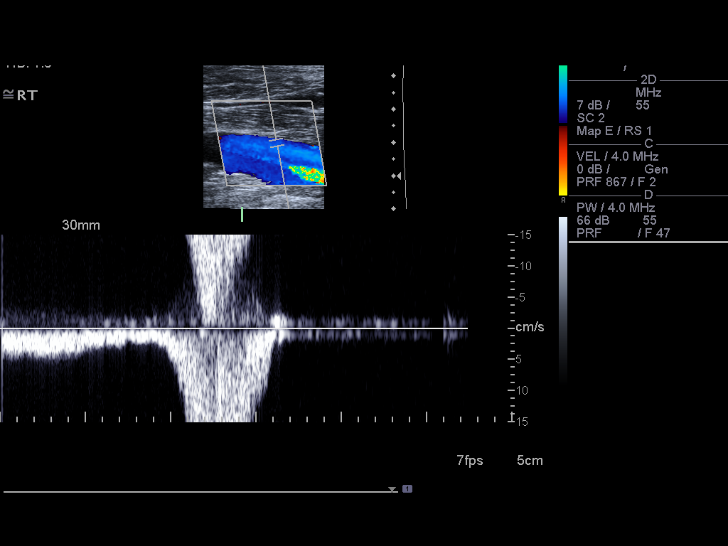
[im 20/26]
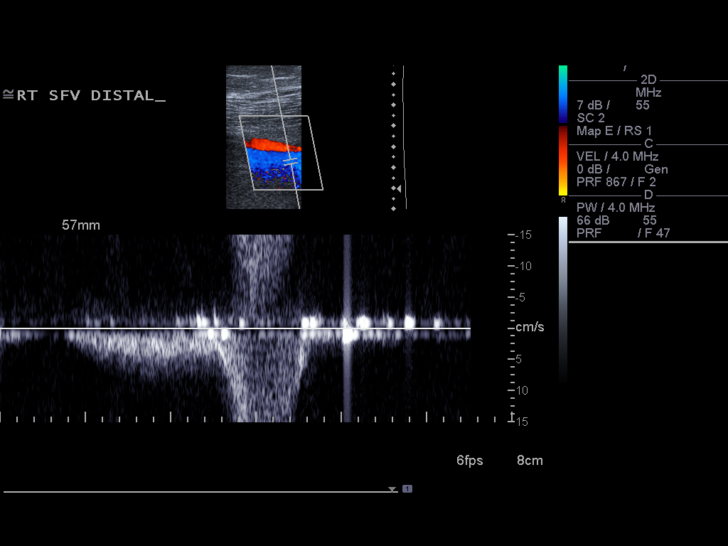
[im 21/26]
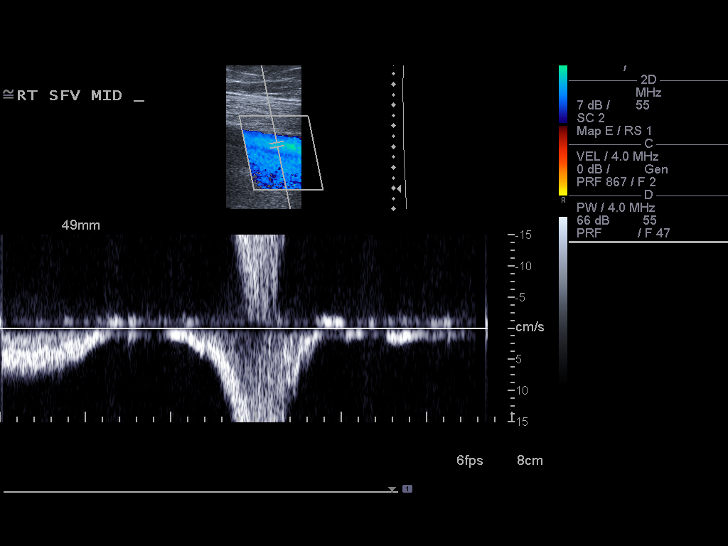
[im 23/26]
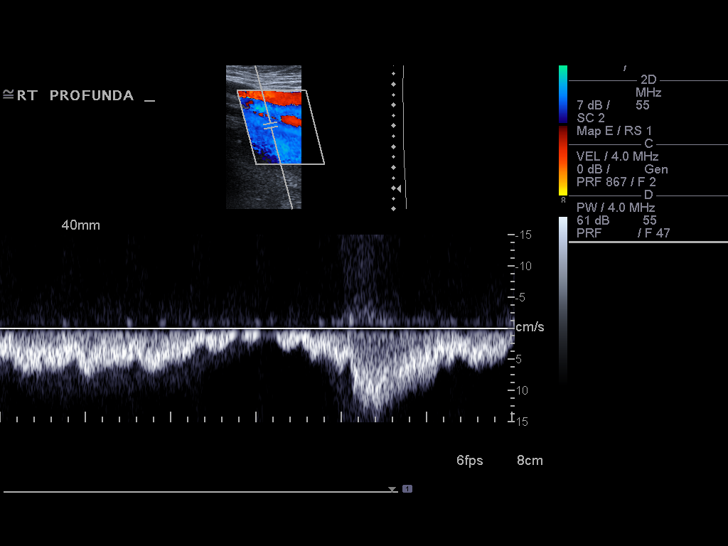
[im 26/26]
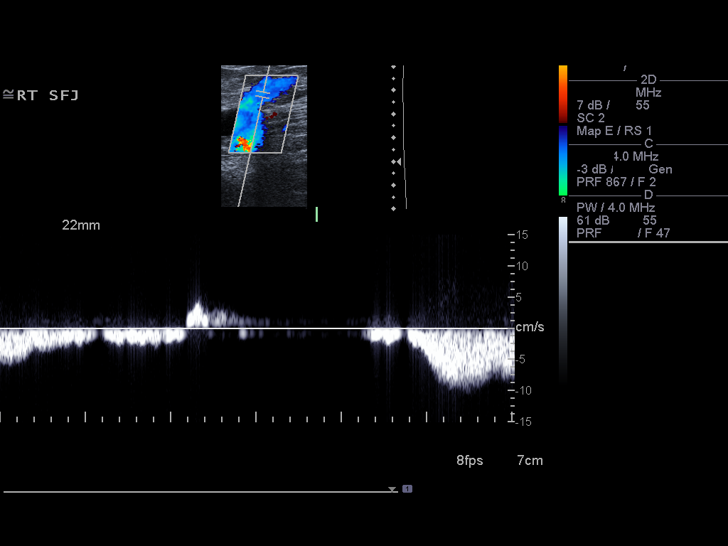

[14 of 24 positions shown; findings below may reference images not displayed]

FINDINGS: Normal compressibility of the common femoral,
superficial femoral, and popliteal veins is demonstrated, as well
as the visualized proximal calf veins.  No filling defects to
suggest DVT on grayscale or color Doppler imaging.  Doppler
waveforms show normal direction of venous flow, normal respiratory
phasicity and response to augmentation.
IMPRESSION: No evidence of right lower extremity deep vein thrombosis.

## 2011-12-31 ENCOUNTER — Other Ambulatory Visit: Payer: Self-pay | Admitting: *Deleted

## 2011-12-31 MED ORDER — DOFETILIDE 125 MCG PO CAPS: 375.0000 ug | ORAL_CAPSULE | Freq: Two times a day (BID) | ORAL | Status: DC

## 2012-01-17 ENCOUNTER — Telehealth: Payer: Self-pay | Admitting: Cardiovascular Disease

## 2012-01-17 MED ORDER — DABIGATRAN ETEXILATE MESYLATE 150 MG PO CAPS: 150.0000 mg | ORAL_CAPSULE | Freq: Two times a day (BID) | ORAL | Status: DC

## 2012-01-17 NOTE — Telephone Encounter (Signed)
New problem:   pradaxa 150 mg.

## 2012-02-12 ENCOUNTER — Other Ambulatory Visit: Payer: Self-pay | Admitting: *Deleted

## 2012-02-12 MED ORDER — FUROSEMIDE 40 MG PO TABS: 40.0000 mg | ORAL_TABLET | Freq: Two times a day (BID) | ORAL | Status: DC

## 2012-02-12 NOTE — Telephone Encounter (Signed)
NO REFILLS UNTIL APPOINTMENT Fax Received. Refill Completed. Kiaja Shorty Chowoe (R.M.A)   

## 2012-02-20 ENCOUNTER — Ambulatory Visit (INDEPENDENT_AMBULATORY_CARE_PROVIDER_SITE_OTHER): Payer: BC Managed Care – PPO | Admitting: Cardiovascular Disease

## 2012-02-20 ENCOUNTER — Encounter: Payer: Self-pay | Admitting: Cardiovascular Disease

## 2012-02-20 VITALS — BP 126/84 | HR 68 | Ht 69.5 in | Wt 246.0 lb

## 2012-02-20 DIAGNOSIS — Z7901 Long term (current) use of anticoagulants: Secondary | ICD-10-CM

## 2012-02-20 DIAGNOSIS — I4891 Unspecified atrial fibrillation: Secondary | ICD-10-CM

## 2012-02-20 NOTE — Assessment & Plan Note (Signed)
[name] is doing well. We'll continue with her current medications. I've encouraged her to exercise on regular basis.

## 2012-02-20 NOTE — Progress Notes (Signed)
[name] Date of Birth  03/12/1950 [facility] [address]    [address] [place], [place]  [address] [phone]  Fax  [phone]  Problems List 1. Atrial fibrillation 2. Hyperlipidemia 3. Diastolic congestive heart failure   History of Present Illness:  62 year old female with a history of atrial fibrillation. She's been well controlled on Tikosyn. She also has a history of hyperlipidemia and is on Lipitor. She's been on chronic Pradaxa therapy for anticoagulation.  February 20, 2012 [name]  has done well since I last saw her. 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 She has occasional palpitations but overall she's doing quite well.  She is exercising ( water aerobics) twice a week.   Current Outpatient Prescriptions on File Prior to Visit  Medication Sig Dispense Refill  . aspirin 81 MG tablet Take 81 mg by mouth daily.        [name] atorvastatin (LIPITOR) 20 MG tablet Take 20 mg by mouth daily.        . dabigatran (PRADAXA) 150 MG CAPS Take 1 capsule (150 mg total) by mouth every 12 (twelve) hours.  180 capsule  0  . dofetilide (TIKOSYN) 125 MCG capsule Take 3 capsules (375 mcg total) by mouth 2 (two) times daily.  540 capsule  1  . furosemide (LASIX) 40 MG tablet Take 1 tablet (40 mg total) by mouth 2 (two) times daily.  180 tablet  0  . gabapentin (NEURONTIN) 600 MG tablet Take by mouth daily. Take 1.5 Tablets Daily      . lidocaine (XYLOCAINE) 2 % solution 20 mLs as needed.       . metoCLOPramide (REGLAN) 10 MG tablet Take 10 mg by mouth as needed.      . potassium chloride SA (K-DUR,KLOR-CON) 20 MEQ tablet TAKE 2 TABLETS TWICE A DAY  480 tablet  3    Allergies  Allergen Reactions  . Codeine     VERTIGO, NAUSEA & VOMITTING  . Oxycodone   . Penicillins Rash    Past Medical History  Diagnosis Date  . Paroxysmal atrial fibrillation   . Migraine headache   . Dyspnea   . Restrictive cardiomyopathy   . Diastolic congestive heart  failure   . Hyperlipemia   . Chronic anticoagulation     Past Surgical History  Procedure Date  . Cardiac catheterization   . Cardioversion   . Rotator cuff repair   . Ectopic pregnancy surgery   . Cholecystectomy     History  Smoking status  . Never Smoker   Smokeless tobacco  . Not on file    History  Alcohol Use: Not on file    Family History  Problem Relation Age of Onset  . Hypertension Father   . Atrial fibrillation Mother     Reviw of Systems:  Reviewed in the HPI.  All other systems are negative.  Physical Exam: BP 126/84  Pulse 68  Ht 5' 9.5" (1.765 m)  Wt 246 lb (111.585 kg)  BMI 35.81 kg/m2  SpO2 98% The patient is alert and oriented x 3.  The mood and affect are normal.   Skin: warm and dry.  Color is normal.    HEENT:   the sclera are nonicteric.  The mucous membranes are moist.  The carotids are 2+ without bruits.  There is no thyromegaly.  There is no JVD.    Lungs: clear.  The chest wall is non tender.    Heart: regular rate  with a normal S1 and S2.  There are no murmurs, gallops, or rubs. The PMI is not displaced.     Abdomen: good bowel sounds.  There is no guarding or rebound.  There is no hepatosplenomegaly or tenderness.  There are no masses.   Extremities:  Trace ankle edema.  The legs are without rashes.  The distal pulses are intact.   Neuro:  Cranial nerves II - XII are intact.  Motor and sensory functions are intact.    The gait is normal.  ECG: February 20, 2012:  Normal sinus rhythm at 62. The QT interval is 434 ms.  Assessment / Plan:

## 2012-02-20 NOTE — Patient Instructions (Addendum)
Your physician wants you to follow-up in: 1 year  You will receive a reminder letter in the mail two months in advance. If you don't receive a letter, please call our office to schedule the follow-up appointment.  Your physician recommends that you continue on your current medications as directed. Please refer to the Current Medication list given to you today.  

## 2012-04-07 ENCOUNTER — Other Ambulatory Visit: Payer: Self-pay | Admitting: *Deleted

## 2012-04-07 MED ORDER — POTASSIUM CHLORIDE CRYS ER 20 MEQ PO TBCR: 20.0000 meq | EXTENDED_RELEASE_TABLET | Freq: Two times a day (BID) | ORAL | Status: DC

## 2012-04-07 MED ORDER — DABIGATRAN ETEXILATE MESYLATE 150 MG PO CAPS: 150.0000 mg | ORAL_CAPSULE | Freq: Two times a day (BID) | ORAL | Status: DC

## 2012-04-07 MED ORDER — FUROSEMIDE 40 MG PO TABS: 40.0000 mg | ORAL_TABLET | Freq: Two times a day (BID) | ORAL | Status: DC

## 2012-04-07 MED ORDER — DOFETILIDE 125 MCG PO CAPS: 375.0000 ug | ORAL_CAPSULE | Freq: Two times a day (BID) | ORAL | Status: DC

## 2012-04-07 NOTE — Telephone Encounter (Signed)
Fax Received. Refill Completed. Heli Dino Chowoe (R.M.A)   

## 2012-05-07 ENCOUNTER — Other Ambulatory Visit: Payer: Self-pay | Admitting: Orthopaedic Surgery

## 2012-05-07 DIAGNOSIS — M545 Low back pain, unspecified: Secondary | ICD-10-CM

## 2012-05-09 ENCOUNTER — Other Ambulatory Visit: Payer: BC Managed Care – PPO

## 2012-05-12 ENCOUNTER — Other Ambulatory Visit: Payer: Self-pay

## 2012-05-12 DIAGNOSIS — Z1231 Encounter for screening mammogram for malignant neoplasm of breast: Secondary | ICD-10-CM

## 2012-05-21 ENCOUNTER — Telehealth: Payer: Self-pay | Admitting: Cardiovascular Disease

## 2012-05-21 NOTE — Telephone Encounter (Signed)
Pt called and per Dr Elease Hashimoto pt may take medication.

## 2012-05-21 NOTE — Telephone Encounter (Signed)
New problem    Patient calling    Having back problem recent prescribe celebrex 200 mg. By  Dr. Cleophas Dunker. Is this ok to take with her current heart medication.

## 2012-06-04 ENCOUNTER — Encounter: Payer: Self-pay | Admitting: Cardiovascular Disease

## 2012-06-12 ENCOUNTER — Ambulatory Visit
Admission: RE | Admit: 2012-06-12 | Discharge: 2012-06-12 | Disposition: A | Discharge status: Home / Self Care | Payer: BC Managed Care – PPO | Source: Ambulatory Visit | Attending: Orthopaedic Surgery | Admitting: Orthopaedic Surgery

## 2012-06-12 DIAGNOSIS — M545 Low back pain, unspecified: Secondary | ICD-10-CM

## 2012-06-16 ENCOUNTER — Telehealth: Payer: Self-pay | Admitting: *Deleted

## 2012-06-16 ENCOUNTER — Ambulatory Visit
Admission: RE | Admit: 2012-06-16 | Discharge: 2012-06-16 | Disposition: A | Discharge status: Home / Self Care | Payer: BC Managed Care – PPO | Source: Ambulatory Visit

## 2012-06-16 DIAGNOSIS — Z1231 Encounter for screening mammogram for malignant neoplasm of breast: Secondary | ICD-10-CM

## 2012-06-16 MED ORDER — POTASSIUM CHLORIDE CRYS ER 20 MEQ PO TBCR: 40.0000 meq | EXTENDED_RELEASE_TABLET | Freq: Two times a day (BID) | ORAL | Status: DC

## 2012-06-16 NOTE — Telephone Encounter (Signed)
Pt wanted to know if she could hold pradaxa for back injection. Per Dr Elease Hashimoto she may hold Pradaxa for two days prior to procedure. Pt was made aware and I will take copy of note to MR to fax to Dr Norlene Campbell @ fax [phone].  Also her K+ was reviewed and MAR was adjusted.

## 2012-06-17 ENCOUNTER — Telehealth: Payer: Self-pay | Admitting: Cardiovascular Disease

## 2012-06-17 NOTE — Telephone Encounter (Signed)
Received request from Nurse.   To: Dr.Peter Cleophas Dunker Fax number: [phone] Attention: Ok To Hold Pradaxa note 06/17/12/KM

## 2012-08-05 ENCOUNTER — Telehealth: Payer: Self-pay | Admitting: Cardiovascular Disease

## 2012-08-05 NOTE — Telephone Encounter (Signed)
New Problem  Pt states she need permission for Dr Alvester Morin to give her an injection in her back. She said it would require her to stay off her meds for a couple of days.

## 2012-08-05 NOTE — Telephone Encounter (Signed)
Per Dr Elease Hashimoto pt may hold pradaxa the day prior to the injection and the morning of the injection. Take the next dose. Pt verbalized understanding/ will fax to Dr Winfred Leeds fax [phone].

## 2012-08-07 NOTE — Telephone Encounter (Signed)
Will fax again

## 2012-08-07 NOTE — Telephone Encounter (Signed)
F/u     Pt need the fax to Dr Alvester Morin office faxed again b/c they didn't received it. Fax [phone].

## 2012-09-30 ENCOUNTER — Ambulatory Visit: Payer: Self-pay | Admitting: Obstetrics & Gynecology

## 2012-10-02 ENCOUNTER — Ambulatory Visit: Payer: Self-pay | Admitting: Obstetrics & Gynecology

## 2012-10-06 ENCOUNTER — Encounter: Payer: Self-pay | Admitting: Obstetrics & Gynecology

## 2012-10-06 ENCOUNTER — Ambulatory Visit (INDEPENDENT_AMBULATORY_CARE_PROVIDER_SITE_OTHER): Payer: BC Managed Care – PPO | Admitting: Obstetrics & Gynecology

## 2012-10-06 VITALS — BP 122/84 | HR 64 | Resp 16 | Ht 69.25 in | Wt 241.0 lb

## 2012-10-06 DIAGNOSIS — Z01419 Encounter for gynecological examination (general) (routine) without abnormal findings: Secondary | ICD-10-CM

## 2012-10-06 NOTE — Patient Instructions (Signed)
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
## 2012-10-29 ENCOUNTER — Ambulatory Visit (INDEPENDENT_AMBULATORY_CARE_PROVIDER_SITE_OTHER): Payer: BC Managed Care – PPO | Admitting: Neurology

## 2012-10-29 ENCOUNTER — Encounter: Payer: Self-pay | Admitting: Neurology

## 2012-10-29 VITALS — BP 128/73 | HR 72 | Ht 69.0 in | Wt 239.0 lb

## 2012-10-29 DIAGNOSIS — G250 Essential tremor: Secondary | ICD-10-CM

## 2012-10-29 MED ORDER — PRIMIDONE 50 MG PO TABS: 50.0000 mg | ORAL_TABLET | Freq: Three times a day (TID) | ORAL | Status: DC

## 2012-10-29 MED ORDER — CLONAZEPAM 1 MG PO TABS: 1.0000 mg | ORAL_TABLET | Freq: Two times a day (BID) | ORAL | Status: DC | PRN
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
## 2012-11-03 ENCOUNTER — Other Ambulatory Visit: Payer: Self-pay

## 2012-11-03 MED ORDER — PRIMIDONE 50 MG PO TABS: 50.0000 mg | ORAL_TABLET | Freq: Three times a day (TID) | ORAL | Status: DC

## 2012-11-03 NOTE — Telephone Encounter (Signed)
Patient called requesting we send her Rx to [facility].

## 2013-01-26 ENCOUNTER — Encounter: Payer: Self-pay | Admitting: Cardiovascular Disease

## 2013-01-29 ENCOUNTER — Encounter: Payer: Self-pay | Admitting: Neurology

## 2013-01-29 ENCOUNTER — Encounter (INDEPENDENT_AMBULATORY_CARE_PROVIDER_SITE_OTHER): Payer: Self-pay

## 2013-01-29 ENCOUNTER — Ambulatory Visit (INDEPENDENT_AMBULATORY_CARE_PROVIDER_SITE_OTHER): Payer: BC Managed Care – PPO | Admitting: Neurology

## 2013-01-29 ENCOUNTER — Telehealth: Payer: Self-pay | Admitting: Neurology

## 2013-01-29 VITALS — BP 134/78 | HR 81 | Ht 69.0 in | Wt 250.0 lb

## 2013-01-29 DIAGNOSIS — G43909 Migraine, unspecified, not intractable, without status migrainosus: Secondary | ICD-10-CM

## 2013-01-29 DIAGNOSIS — G252 Other specified forms of tremor: Secondary | ICD-10-CM

## 2013-01-29 DIAGNOSIS — G250 Essential tremor: Secondary | ICD-10-CM

## 2013-01-29 MED ORDER — PRIMIDONE 50 MG PO TABS: 50.0000 mg | ORAL_TABLET | Freq: Every day | ORAL | Status: DC

## 2013-01-29 NOTE — Progress Notes (Signed)
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 Virgina Jock for evaluation of bilateral hand tremor.  She had past medical history of atrial fibrillation, coronary artery disease, congestive heart failure, migraine headaches  She noticed  hands tremor in her thirties, progressive worsened over the past 30 years, she describes difficulty holding a pencil, writing, her hands tremors are symmetric, is getting much worse over the past few days, she also had leg tremor sometimes while bearing weight, with head titubation,  jaw tremor occasionally.    She denies weakness, she denies loss sense of smell, she denies REM sleep disorder, She was given prescription of Klonopin 1 mg, while taking one tablet the day, she complains of excessive drowsiness.  Her siblings, and maternal aunt has similar tremor in the past, both of her parents died of dementia at old age,  She complains of mild gait difficulty because of low back pain, radiating pain to her left hip, she denies weakness.  She had physical therapy for her low back pain  UPDATE Jan 9th 2015: She is taking primidone $RemoveBeforeDE'50mg'[id number]$  qhs,  She reported 80% improvement with primidone,  She no longer has inside tremor, her bilateral hands tremor has improved as well. She took it 7pm, before she goes to bed around 11pm, get up 2-3 times during night, the first night, she is about to fall, because of dizziness was primidone, she continued to have mild degree of primidone, without reason, she did not titrating up her dosage, she is happy about current management,   She is also taking Neurontin 600 mgone and half tablets every night as migraine prevention,  [facility]  Her  low back pain has improved with physical therapy   REVIEW OF SYSTEMS: Full 14  system review of systems performed and notable only for  Recent flu, low back pain,  ALLERGIES: Allergies  Allergen Reactions  . Codeine     VERTIGO, NAUSEA & VOMITTING  . Oxycodone   . Penicillins Rash    HOME MEDICATIONS: Outpatient Prescriptions Prior to Visit  Medication Sig Dispense Refill  . aspirin 81 MG tablet Take 81 mg by mouth daily.        [name] atorvastatin (LIPITOR) 20 MG tablet Take 20 mg by mouth daily.        . clonazePAM (KLONOPIN) 1 MG tablet Take 1 mg by mouth daily.      . dabigatran (PRADAXA) 150 MG CAPS Take 1 capsule (150 mg total) by mouth every 12 (twelve) hours.  180 capsule  3  . dofetilide (TIKOSYN) 125 MCG capsule Take 3 capsules (375 mcg total) by mouth 2 (two) times daily.  540 capsule  3  . furosemide (LASIX) 40 MG tablet Take 1 tablet (40 mg total) by mouth 2 (two) times daily.  180 tablet  3  . gabapentin (NEURONTIN) 600 MG tablet Take by mouth daily. Take 1.5 Tablets Daily      . lidocaine (XYLOCAINE) 2 % solution 20 mLs as needed.       . Methylsulfonylmethane (MSM PO) Take 1,000 mg by mouth.      . metoCLOPramide (REGLAN) 10 MG tablet Take 10 mg by mouth as needed.      . Misc Natural Products (ECHINACEA GOLDENSEAL PLUS PO) Take by mouth daily.      . Multiple  Vitamins-Minerals (VISION FORMULA PO) Take by mouth.      . potassium chloride SA (K-DUR,KLOR-CON) 20 MEQ tablet Take 2 tablets (40 mEq total) by mouth 2 (two) times daily.  360 tablet  3  . primidone (MYSOLINE) 50 MG tablet Take 1 tablet (50 mg total) by mouth 3 (three) times daily.  90 tablet  12  . naproxen sodium (ANAPROX) 220 MG tablet Take 220 mg by mouth as needed.       No facility-administered medications prior to visit.    PAST MEDICAL HISTORY: Past Medical History  Diagnosis Date  . Paroxysmal atrial fibrillation   . Migraine headache   . Dyspnea   . Restrictive cardiomyopathy   . Diastolic congestive heart failure   . Hyperlipemia   . Chronic anticoagulation   . Tubal  pregnancy   . Back pain     nerve problems-had series of three injections    PAST SURGICAL HISTORY: Past Surgical History  Procedure Laterality Date  . Cardiac catheterization    . Cardioversion    . Rotator cuff repair      bilateral  . Ectopic pregnancy surgery  1980  . Cholecystectomy    . Breast lumpectomy      benign  . Eye surgery      left eye injections x2-was having "stroke like" flashes of light  . Hysteroscopy  6/05    and D&C-simple hyperplasia secondary PMP bleeding    FAMILY HISTORY: Family History  Problem Relation Age of Onset  . Hypertension Father   . Atrial fibrillation Mother   . Cancer Maternal Grandmother     mouth  . Cancer Maternal Uncle     unknown type  . Thyroid cancer Sister   . Breast cancer Sister 58  . Heart disease Sister     ? diag    SOCIAL HISTORY:  History   Social History  . Marital Status: Married    Spouse Name: N/A    Number of Children: 0  . Years of Education: college   Occupational History  .      retired   Social History Main Topics  . Smoking status: Never Smoker   . Smokeless tobacco: Never Used  . Alcohol Use: 0.0 oz/week     Comment: 1 per month  . Drug Use: No  . Sexual Activity: Yes   Other Topics Concern  . Not on file   Social History Narrative   Patient lives at home with her husband [name])  and she is retired. College education.   Patient is retired.   Caffeine- Two cups of caffeine daily .     PHYSICAL EXAM   Filed Vitals:   01/29/13 1339  BP: 134/78  Pulse: 81  Height: 5' 9"  (1.753 m)  Weight: 250 lb (113.399 kg)    Not recorded    Body mass index is 36.9 kg/(m^2).   Generalized: In no acute distress  Neck: Supple, no carotid bruits   Cardiac: Regular rate rhythm  Pulmonary: Clear to auscultation bilaterally  Musculoskeletal: No deformity  Neurological examination  Mentation: Alert oriented to time, place, history taking, and causual conversation  Cranial nerve  II-XII: Pupils were equal round reactive to light extraocular movements were full, visual field were full on confrontational test. facial sensation and strength were normal. hearing was intact to finger rubbing bilaterally. Uvula tongue midline.  head turning and shoulder shrug and were normal and symmetric.Tongue protrusion into cheek strength was normal.  Motor: normal tone,  bulk and strength, with exception of mild bilateral hands postural tremor  Sensory: Intact to fine touch, pinprick, preserved vibratory sensation, and proprioception at toes.  Coordination: Normal finger to nose, heel-to-shin bilaterally there was no truncal ataxia  Gait: Rising up from seated position without assistance, normal stance, cautious gait Romberg signs: Negative  Deep tendon reflexes: Brachioradialis 2/2, biceps 2/2, triceps 2/2, patellar 2/2, Achilles 2/2, plantar responses were flexor bilaterally.   DIAGNOSTIC DATA (LABS, IMAGING, TESTING) - I reviewed patient records, labs, notes, testing and imaging myself where available.  Lab Results  Component Value Date   WBC 9.7 03/12/2007   HGB 12.1 POINT OF CARE RESULT 10/22/2007   HCT 39.8 03/12/2007   MCV 87.8 03/12/2007   PLT 288 03/12/2007      Component Value Date/Time   NA 140 04/12/2010 1159   K 4.8 04/12/2010 1159   CL 104 04/12/2010 1159   CO2 23 04/12/2010 1159   GLUCOSE 96 04/12/2010 1159   BUN 23 04/12/2010 1159   CREATININE 0.99 04/12/2010 1159   CREATININE 1.06 12/14/2009 0500   CALCIUM 9.6 04/12/2010 1159   ALKPHOS 132* 08/31/2009   GFRNONAA 53* 12/14/2009 0500   GFRAA  Value: >60        The eGFR has been calculated using the MDRD equation. This calculation has not been validated in all clinical situations. eGFR's persistently <60 mL/min signify possible Chronic Kidney Disease. 12/14/2009 0500     ASSESSMENT AND PLAN   63 year old Caucasian female, with history of essential tremor, which has much improved with low dose primidone 50 mg every  night, she could not tolerate higher dose to 2 nighttime dizziness, she has to get up couple of times each night using bathroom, she is taking gabapentin 900 mg every night, which also likely contributing her dizziness,  1.   Continue current lower dose of Primidone 50 mg every night 2. May consider lower dose of gabapentin, or other preventive medications for headaches,   She complains of excessive weight gain 3.   Return to clinic as needed      Marcial Pacas, M.D. Ph.D.  [facility] [address], [place], [place] [address] [phone]

## 2013-02-01 ENCOUNTER — Telehealth: Payer: Self-pay | Admitting: Neurology

## 2013-02-01 NOTE — Telephone Encounter (Signed)
I spoke to pharmacist from [facility] about new prescription for Primidone.  He had a question about the current dose of one tab (50mg ) at bedtime, said her previous Rx was for one tab three times a day.  I read the doctors office note which said to continue lower dose of Primidone 50mg  every night.   Pharmacist expressed understanding of new dose.

## 2013-02-01 NOTE — Telephone Encounter (Signed)
error 

## 2013-02-04 ENCOUNTER — Ambulatory Visit: Payer: BC Managed Care – PPO | Admitting: Cardiovascular Disease

## 2013-03-05 ENCOUNTER — Ambulatory Visit: Payer: BC Managed Care – PPO | Admitting: Cardiovascular Disease

## 2013-03-15 ENCOUNTER — Other Ambulatory Visit: Payer: Self-pay | Admitting: Cardiovascular Disease

## 2013-03-16 ENCOUNTER — Ambulatory Visit (INDEPENDENT_AMBULATORY_CARE_PROVIDER_SITE_OTHER): Payer: BC Managed Care – PPO | Admitting: Cardiovascular Disease

## 2013-03-16 ENCOUNTER — Encounter: Payer: Self-pay | Admitting: Cardiovascular Disease

## 2013-03-16 VITALS — BP 131/64 | HR 59 | Ht 69.25 in | Wt 247.0 lb

## 2013-03-16 DIAGNOSIS — E785 Hyperlipidemia, unspecified: Secondary | ICD-10-CM

## 2013-03-16 DIAGNOSIS — Z79899 Other long term (current) drug therapy: Secondary | ICD-10-CM

## 2013-03-16 DIAGNOSIS — I4891 Unspecified atrial fibrillation: Secondary | ICD-10-CM

## 2013-03-16 LAB — BASIC METABOLIC PANEL
BUN: 18 mg/dL (ref 6–23)
CHLORIDE: 105 meq/L (ref 96–112)
CO2: 27 meq/L (ref 19–32)
CREATININE: 0.9 mg/dL (ref 0.4–1.2)
Calcium: 9.5 mg/dL (ref 8.4–10.5)
GFR: 69.01 mL/min (ref >60.00)
Glucose, Bld: 88 mg/dL (ref 70–99)
POTASSIUM: 4.1 meq/L (ref 3.5–5.1)
SODIUM: 140 meq/L (ref 135–145)

## 2013-03-16 LAB — MAGNESIUM: Magnesium: 2 mg/dL (ref 1.5–2.5)

## 2013-03-16 NOTE — Assessment & Plan Note (Signed)
[name] is doing well.  She is maintaining NSR.  Will get BMP and mag level today.   No changes.  Will have her see me again in 1 year.  I have asked to ask her medical doctor to draw a magnesium level with her BMPs as part of a regular schedule ( to monitor for tikosyn)

## 2013-03-16 NOTE — Progress Notes (Signed)
[name] ReaderJacqueline F [name] Date of Birth  April 17, 1950 [facility] [address]    [address] [place], [place]  [id number] [phone]  Fax  [phone]  Problems List 1. Atrial fibrillation 2. Hyperlipidemia 3. Diastolic congestive heart failure   History of Present Illness:  63 year old female with a history of atrial fibrillation. She's been well controlled on Tikosyn. She also has a history of hyperlipidemia and is on Lipitor. She's been on chronic Pradaxa therapy for anticoagulation.  February 20, 2012 [name] PihJackie  has done well since I last saw her. She retired from the [employer] last summer. She has occasional palpitations but overall she's doing quite well.  She is exercising ( water aerobics) twice a week.  Feb. 24, 2015:  [name] PihJackie is doing well.   No problems.  Maintaining NSR.   Current Outpatient Prescriptions on File Prior to Visit  Medication Sig Dispense Refill  . aspirin 81 MG tablet Take 81 mg by mouth daily.        [name]. atorvastatin (LIPITOR) 20 MG tablet Take 20 mg by mouth daily.        . dabigatran (PRADAXA) 150 MG CAPS Take 1 capsule (150 mg total) by mouth every 12 (twelve) hours.  180 capsule  3  . dofetilide (TIKOSYN) 125 MCG capsule Take 3 capsules (375 mcg total) by mouth 2 (two) times daily.  540 capsule  3  . furosemide (LASIX) 40 MG tablet Take 1 tablet (40 mg total) by mouth 2 (two) times daily.  180 tablet  3  . gabapentin (NEURONTIN) 600 MG tablet Take by mouth daily. Take 1.5 Tablets Daily      . lidocaine (XYLOCAINE) 2 % solution 20 mLs as needed.       . Methylsulfonylmethane (MSM PO) Take 1,000 mg by mouth.      . metoCLOPramide (REGLAN) 10 MG tablet Take 10 mg by mouth as needed.      . Misc Natural Products (ECHINACEA GOLDENSEAL PLUS PO) Take by mouth daily.      . Multiple Vitamins-Minerals (VISION FORMULA PO) Take by mouth.      . potassium chloride SA (K-DUR,KLOR-CON) 20 MEQ tablet Take 2 tablets (40 mEq total) by mouth 2 (two)  times daily.  360 tablet  3   No current facility-administered medications on file prior to visit.    Allergies  Allergen Reactions  . Codeine     VERTIGO, NAUSEA & VOMITTING  . Oxycodone   . Penicillins Rash    Past Medical History  Diagnosis Date  . Paroxysmal atrial fibrillation   . Migraine headache   . Dyspnea   . Restrictive cardiomyopathy   . Diastolic congestive heart failure   . Hyperlipemia   . Chronic anticoagulation   . Tubal pregnancy   . Back pain     nerve problems-had series of three injections    Past Surgical History  Procedure Laterality Date  . Cardiac catheterization    . Cardioversion    . Rotator cuff repair      bilateral  . Ectopic pregnancy surgery  1980  . Cholecystectomy    . Breast lumpectomy      benign  . Eye surgery      left eye injections x2-was having "stroke like" flashes of light  . Hysteroscopy  6/05    and D&C-simple hyperplasia secondary PMP bleeding    History  Smoking status  . Never Smoker   Smokeless tobacco  . Never Used  History  Alcohol Use  . 0.0 oz/week    Comment: 1 per month    Family History  Problem Relation Age of Onset  . Hypertension Father   . Atrial fibrillation Mother   . Cancer Maternal Grandmother     mouth  . Cancer Maternal Uncle     unknown type  . Thyroid cancer Sister   . Breast cancer Sister 37  . Heart disease Sister     ? diag    Reviw of Systems:  Reviewed in the HPI.  All other systems are negative.  Physical Exam: BP 131/64  Pulse 59  Ht 5' 9.25" (1.759 m)  Wt 247 lb (112.038 kg)  BMI 36.21 kg/m2  LMP 01/21/1990 The patient is alert and oriented x 3.  The mood and affect are normal.   Skin: warm and dry.  Color is normal.    HEENT:   the sclera are nonicteric.  The mucous membranes are moist.  The carotids are 2+ without bruits.  There is no thyromegaly.  There is no JVD.    Lungs: clear.  The chest wall is non tender.    Heart: regular rate with a normal S1  and S2.  There are no murmurs, gallops, or rubs. The PMI is not displaced.     Abdomen: good bowel sounds.  There is no guarding or rebound.  There is no hepatosplenomegaly or tenderness.  There are no masses.   Extremities:  Trace ankle edema.  The legs are without rashes.  The distal pulses are intact.   Neuro:  Cranial nerves II - XII are intact.  Motor and sensory functions are intact.    The gait is normal.  ECG: Feb. 24, 2015:    Sinus brady at 59.  No St or T wave changes.  Assessment / Plan:

## 2013-03-16 NOTE — Assessment & Plan Note (Signed)
Managed by her primary medical doctor

## 2013-03-16 NOTE — Patient Instructions (Signed)
Your physician recommends that you return for lab work in:  Today bmet/ magnesium  Your physician wants you to follow-up in:1 year  You will receive a reminder letter in the mail two months in advance. If you don't receive a letter, please call our office to schedule the follow-up appointment.

## 2013-05-02 ENCOUNTER — Other Ambulatory Visit: Payer: Self-pay | Admitting: Cardiovascular Disease

## 2013-05-10 ENCOUNTER — Other Ambulatory Visit: Payer: Self-pay

## 2013-05-10 DIAGNOSIS — Z1231 Encounter for screening mammogram for malignant neoplasm of breast: Secondary | ICD-10-CM

## 2013-05-21 ENCOUNTER — Other Ambulatory Visit: Payer: Self-pay | Admitting: Cardiovascular Disease

## 2013-06-18 ENCOUNTER — Ambulatory Visit
Admission: RE | Admit: 2013-06-18 | Discharge: 2013-06-18 | Disposition: A | Discharge status: Home / Self Care | Payer: BC Managed Care – PPO | Source: Ambulatory Visit

## 2013-06-18 DIAGNOSIS — Z1231 Encounter for screening mammogram for malignant neoplasm of breast: Secondary | ICD-10-CM

## 2013-06-22 ENCOUNTER — Encounter: Payer: Self-pay | Admitting: Cardiovascular Disease

## 2013-08-17 ENCOUNTER — Emergency Department (HOSPITAL_COMMUNITY)
Admission: EM | Admit: 2013-08-17 | Discharge: 2013-08-17 | Disposition: A | Discharge status: Home / Self Care | Payer: BC Managed Care – PPO | Source: Home / Self Care

## 2013-08-17 ENCOUNTER — Encounter (HOSPITAL_COMMUNITY): Payer: Self-pay | Admitting: Emergency Medicine

## 2013-08-17 DIAGNOSIS — T148 Other injury of unspecified body region: Secondary | ICD-10-CM

## 2013-08-17 DIAGNOSIS — W57XXXA Bitten or stung by nonvenomous insect and other nonvenomous arthropods, initial encounter: Secondary | ICD-10-CM

## 2013-08-17 MED ORDER — DOXYCYCLINE HYCLATE 100 MG PO CAPS
100.0000 mg | ORAL_CAPSULE | Freq: Two times a day (BID) | ORAL | Status: DC
Start: 2013-08-17 — End: 2013-09-30

## 2013-08-17 NOTE — Discharge Instructions (Signed)
Tick Bite Information °Ticks are insects that attach themselves to the skin and draw blood for food. There are various types of ticks. Common types include wood ticks and deer ticks. Most ticks live in shrubs and grassy areas. Ticks can climb onto your body when you make contact with leaves or grass where the tick is waiting. The most common places on the body for ticks to attach themselves are the scalp, neck, armpits, waist, and groin. °Most tick bites are harmless, but sometimes ticks carry germs that cause diseases. These germs can be spread to a person during the tick's feeding process. The chance of a disease spreading through a tick bite depends on:  °· The type of tick. °· Time of year.   °· How long the tick is attached.   °· Geographic location.   °HOW CAN YOU PREVENT TICK BITES? °Take these steps to help prevent tick bites when you are outdoors: °· Wear protective clothing. Long sleeves and long pants are best.   °· Wear white clothes so you can see ticks more easily. °· Tuck your pant legs into your socks.   °· If walking on a trail, stay in the middle of the trail to avoid brushing against bushes. °· Avoid walking through areas with long grass.  °· Put insect repellent on all exposed skin and along boot tops, pant legs, and sleeve cuffs.   °· Check clothing, hair, and skin repeatedly and before going inside.   °· Brush off any ticks that are not attached. °· Take a shower or bath as soon as possible after being outdoors.    °WHAT IS THE PROPER WAY TO REMOVE A TICK? °Ticks should be removed as soon as possible to help prevent diseases caused by tick bites. °1. If latex gloves are available, put them on before trying to remove a tick.   °2. Using fine-point tweezers, grasp the tick as close to the skin as possible. You may also use curved forceps or a tick removal tool. Grasp the tick as close to its head as possible. Avoid grasping the tick on its body. °3. Pull gently with steady upward pressure until  the tick lets go. Do not twist the tick or jerk it suddenly. This may break off the tick's head or mouth parts. °4. Do not squeeze or crush the tick's body. This could force disease-carrying fluids from the tick into your body.   °5. After the tick is removed, wash the bite area and your hands with soap and water or other disinfectant such as alcohol. °6. Apply a small amount of antiseptic cream or ointment to the bite site.   °7. Wash and disinfect any instruments that were used.   °Do not try to remove a tick by applying a hot match, petroleum jelly, or fingernail polish to the tick. These methods do not work and may increase the chances of disease being spread from the tick bite.  °WHEN SHOULD YOU SEEK MEDICAL CARE? °Contact your health care provider if you are unable to remove a tick from your skin or if a part of the tick breaks off and is stuck in the skin.  °After a tick bite, you need to be aware of signs and symptoms that could be related to diseases spread by ticks. Contact your health care provider if you develop any of the following in the days or weeks after the tick bite: °· Unexplained fever. °· Rash. A circular rash that appears days or weeks after the tick bite may indicate the possibility of Lyme disease. The rash may resemble   a target with a bull's-eye and may occur at a different part of your body than the tick bite. °· Redness and swelling in the area of the tick bite.   °· Tender, swollen lymph glands.   °· Diarrhea.   °· Weight loss.   °· Cough.   °· Fatigue.   °· Muscle, joint, or bone pain.   °· Abdominal pain.   °· Headache.   °· Lethargy or a change in your level of consciousness. °· Difficulty walking or moving your legs.   °· Numbness in the legs.   °· Paralysis. °· Shortness of breath.   °· Confusion.   °· Repeated vomiting.   °Document Released: 01/05/2000 Document Revised: 10/28/2012 Document Reviewed: 06/17/2012 °ExitCare® Patient Information ©2015 [employer]. This information is  not intended to replace advice given to you by your health care provider. Make sure you discuss any questions you have with your health care provider. ° °Lyme Disease °You may have been bitten by a tick and are to watch for the development of Lyme Disease. Lyme Disease is an infection that is caused by a bacteria The bacteria causing this disease is named Borreilia burgdorferi. If a tick is infected with this bacteria and then bites you, then Lyme Disease may occur. These ticks are carried by deer and rodents such as rabbits and mice and infest grassy as well as forested areas. Fortunately most tick bites do not cause Lyme Disease.  °Lyme Disease is easier to prevent than to treat. First, covering your legs with clothing when walking in areas where ticks are possibly abundant will prevent their attachment because ticks tend to stay within inches of the ground. Second, using insecticides containing DEET can be applied on skin or clothing. Last, because it takes about 12 to 24 hours for the tick to transmit the disease after attachment to the human host, you should inspect your body for ticks twice a day when you are in areas where Lyme Disease is common. You must look thoroughly when searching for ticks. The Ixodes tick that carries Lyme Disease is very small. It is around the size of a sesame seed (picture of tick is not actual size). Removal is best done by grasping the tick by the head and pulling it out. Do not to squeeze the body of the tick. This could inject the infecting bacteria into the bite site. Wash the area of the bite with an antiseptic solution after removal.  °Lyme Disease is a disease that may affect many body systems. Because of the small size of the biting tick, most people do not notice being bitten. The first sign of an infection is usually a round red rash that extends out from the center of the tick bite. The center of the lesion may be blood colored (hemorrhagic) or have tiny blisters  (vesicular). Most lesions have bright red outer borders and partial central clearing. This rash may extend out many inches in diameter, and multiple lesions may be present. Other symptoms such as fatigue, headaches, chills and fever, general achiness and swelling of lymph glands may also occur. If this first stage of the disease is left untreated, these symptoms may gradually resolve by themselves, or progressive symptoms may occur because of spread of infection to other areas of the body.  °Follow up with your caregiver to have testing and treatment if you have a tick bite and you develop any of the above complaints. Your caregiver may recommend preventative (prophylactic) medications which kill bacteria (antibiotics). Once a diagnosis of Lyme Disease is made, antibiotic treatment is highly   likely to cure the disease. Effective treatment of late stage Lyme Disease may require longer courses of antibiotic therapy.  °MAKE SURE YOU:  °· Understand these instructions. °· Will watch your condition. °· Will get help right away if you are not doing well or get worse. °Document Released: 04/15/2000 Document Revised: 04/01/2011 Document Reviewed: 06/17/2008 °ExitCare® Patient Information ©2015 [employer]. This information is not intended to replace advice given to you by your health care provider. Make sure you discuss any questions you have with your health care provider. ° °

## 2013-08-17 NOTE — ED Provider Notes (Signed)
CSN: [id number]     Arrival date & time 08/17/13  0840 History   First MD Initiated Contact with Patient 08/17/13 [phone]     Chief Complaint  Patient presents with  . Tick Removal   (Consider location/radiation/quality/duration/timing/severity/associated sxs/prior Treatment) HPI Comments: 63 y o f with a tick bite discovered 2 weeks ago to the R low back. It was pulled off by her husband at that time.  Has been itching and with minor erythema. More recently with a darker ecchymotic center approx 1 cm.  Pt denies ill sx's. Feels well.    Past Medical History  Diagnosis Date  . Paroxysmal atrial fibrillation   . Migraine headache   . Dyspnea   . Restrictive cardiomyopathy   . Diastolic congestive heart failure   . Hyperlipemia   . Chronic anticoagulation   . Tubal pregnancy   . Back pain     nerve problems-had series of three injections   Past Surgical History  Procedure Laterality Date  . Cardiac catheterization    . Cardioversion    . Rotator cuff repair      bilateral  . Ectopic pregnancy surgery  1980  . Cholecystectomy    . Breast lumpectomy      benign  . Eye surgery      left eye injections x2-was having "stroke like" flashes of light  . Hysteroscopy  6/05    and D&C-simple hyperplasia secondary PMP bleeding   Family History  Problem Relation Age of Onset  . Hypertension Father   . Atrial fibrillation Mother   . Cancer Maternal Grandmother     mouth  . Cancer Maternal Uncle     unknown type  . Thyroid cancer Sister   . Breast cancer Sister 27  . Heart disease Sister     ? diag   History  Substance Use Topics  . Smoking status: Never Smoker   . Smokeless tobacco: Never Used  . Alcohol Use: 0.0 oz/week     Comment: 1 per month   OB History   Grav Para Term Preterm Abortions TAB SAB Ect Mult Living   1 0        0     Obstetric Comments   Tubal pregnancy     Review of Systems  Constitutional: Negative.   HENT: Negative.   Respiratory: Negative  for cough and shortness of breath.   Cardiovascular: Negative for chest pain and leg swelling.  Gastrointestinal: Negative.   Genitourinary: Negative.   Skin: Negative for color change, pallor and rash.  Neurological: Negative for dizziness, tremors and speech difficulty.    Allergies  Codeine; Oxycodone; and Penicillins  Home Medications   Prior to Admission medications   Medication Sig Start Date End Date Taking? Authorizing Provider  aspirin 81 MG tablet Take 81 mg by mouth daily.      Historical Provider, MD  atorvastatin (LIPITOR) 20 MG tablet Take 20 mg by mouth daily.      Historical Provider, MD  doxycycline (VIBRAMYCIN) 100 MG capsule Take 1 capsule (100 mg total) by mouth 2 (two) times daily. 08/17/13   Hayden Rasmussen, NP  furosemide (LASIX) 40 MG tablet TAKE 1 TABLET TWICE A DAY 05/21/13   Vesta Mixer, MD  gabapentin (NEURONTIN) 600 MG tablet Take by mouth daily. Take 1.5 Tablets Daily    Historical Provider, MD  KLOR-CON M20 20 MEQ tablet TAKE 2 TABLETS TWICE A DAY    Vesta Mixer, MD  lidocaine (XYLOCAINE) 2 %  solution 20 mLs as needed.     Historical Provider, MD  Methylsulfonylmethane (MSM PO) Take 1,000 mg by mouth.    Historical Provider, MD  metoCLOPramide (REGLAN) 10 MG tablet Take 10 mg by mouth as needed.    Historical Provider, MD  Misc Natural Products (ECHINACEA GOLDENSEAL PLUS PO) Take by mouth daily.    Historical Provider, MD  Multiple Vitamins-Minerals (VISION FORMULA PO) Take by mouth.    Historical Provider, MD  PRADAXA 150 MG CAPS capsule TAKE 1 CAPSULE EVERY 12 HOURS    Vesta MixerPhilip J Nahser, MD  primidone (MYSOLINE) 50 MG tablet Take 50 mg by mouth at bedtime. TAKE HALF A TAB.. 01/29/13   Levert FeinsteinYijun Yan, MD  TIKOSYN 125 MCG capsule TAKE 3 CAPSULES (375 MCG TOTAL) TWICE A DAY    Vesta MixerPhilip J Nahser, MD   BP 134/86  Pulse 74  Temp(Src) 97.8 F (36.6 C) (Oral)  LMP 01/21/1990 Physical Exam  Nursing note and vitals reviewed. Constitutional: She is oriented to person,  place, and time. She appears well-developed and well-nourished. No distress.  Eyes: Conjunctivae and EOM are normal.  Neck: Normal range of motion. Neck supple.  Cardiovascular: Normal rate.   Pulmonary/Chest: Effort normal. No respiratory distress.  Musculoskeletal: She exhibits no edema and no tenderness.  Lymphadenopathy:    She has no cervical adenopathy.  Neurological: She is alert and oriented to person, place, and time.  Skin: Skin is warm and dry.  1 cm area of ecchymosis with surrounding light cutaneous erythema 1 cm from darker area. No signs of Lyme ds rash. No target lesions.    ED Course  Procedures (including critical care time) Labs Review Labs Reviewed - No data to display  Imaging Review No results found.   MDM   1. Tick bite     Pt on Pradaxa which is most likely the reason for the small area of ecchymosis at the bite site. Redness is itchy and superficial, likely local allergic reaction. Minor underlying induration. No definite signs of infection. Pt has no sick sx's or rash.  Doxy 100 bid Warm compresses, if itching use cold. Info for tick bite and illness.    Hayden Rasmussenavid Cordarrell Sane, NP 08/17/13 [id number]

## 2013-08-17 NOTE — ED Notes (Signed)
Patient reports a tick to right lower back, waist-line.  Dark center of area of soreness, redness around the outside.

## 2013-08-17 NOTE — ED Provider Notes (Signed)
Medical screening examination/treatment/procedure(s) were performed by resident physician or non-physician practitioner and as supervising physician I was immediately available for consultation/collaboration.   Sofia Jaquith DOUGLAS MD.   Kiley Torrence D Pearson Reasons, MD 08/17/13 1702 

## 2013-09-30 ENCOUNTER — Emergency Department (HOSPITAL_COMMUNITY): Payer: BC Managed Care – PPO

## 2013-09-30 ENCOUNTER — Emergency Department (HOSPITAL_COMMUNITY)
Admission: EM | Admit: 2013-09-30 | Discharge: 2013-09-30 | Disposition: A | Discharge status: Home / Self Care | Payer: BC Managed Care – PPO | Attending: Emergency Medicine | Admitting: Emergency Medicine

## 2013-09-30 ENCOUNTER — Encounter (HOSPITAL_COMMUNITY): Payer: Self-pay | Admitting: Emergency Medicine

## 2013-09-30 DIAGNOSIS — Z7901 Long term (current) use of anticoagulants: Secondary | ICD-10-CM | POA: Insufficient documentation

## 2013-09-30 DIAGNOSIS — Z79899 Other long term (current) drug therapy: Secondary | ICD-10-CM | POA: Insufficient documentation

## 2013-09-30 DIAGNOSIS — Z7982 Long term (current) use of aspirin: Secondary | ICD-10-CM | POA: Insufficient documentation

## 2013-09-30 DIAGNOSIS — I5030 Unspecified diastolic (congestive) heart failure: Secondary | ICD-10-CM | POA: Insufficient documentation

## 2013-09-30 DIAGNOSIS — E785 Hyperlipidemia, unspecified: Secondary | ICD-10-CM | POA: Diagnosis not present

## 2013-09-30 DIAGNOSIS — Z9889 Other specified postprocedural states: Secondary | ICD-10-CM | POA: Diagnosis not present

## 2013-09-30 DIAGNOSIS — R0602 Shortness of breath: Secondary | ICD-10-CM | POA: Diagnosis not present

## 2013-09-30 DIAGNOSIS — Z7902 Long term (current) use of antithrombotics/antiplatelets: Secondary | ICD-10-CM | POA: Insufficient documentation

## 2013-09-30 DIAGNOSIS — Z880 Allergy status to penicillin: Secondary | ICD-10-CM | POA: Diagnosis not present

## 2013-09-30 DIAGNOSIS — R079 Chest pain, unspecified: Secondary | ICD-10-CM | POA: Insufficient documentation

## 2013-09-30 LAB — BASIC METABOLIC PANEL
Anion gap: 14 (ref 5–15)
BUN: 19 mg/dL (ref 6–23)
CHLORIDE: 101 meq/L (ref 96–112)
CO2: 28 meq/L (ref 19–32)
CREATININE: 0.9 mg/dL (ref 0.50–1.10)
Calcium: 9.6 mg/dL (ref 8.4–10.5)
GFR calc Af Amer: 77 mL/min — ABNORMAL LOW (ref >90)
GFR calc non Af Amer: 67 mL/min — ABNORMAL LOW (ref >90)
GLUCOSE: 114 mg/dL — AB (ref 70–99)
Potassium: 3.8 mEq/L (ref 3.7–5.3)
Sodium: 143 mEq/L (ref 137–147)

## 2013-09-30 LAB — CBC
HEMATOCRIT: 36.9 % (ref 36.0–46.0)
HEMOGLOBIN: 11.9 g/dL — AB (ref 12.0–15.0)
MCH: 27 pg (ref 26.0–34.0)
MCHC: 32.2 g/dL (ref 30.0–36.0)
MCV: 83.7 fL (ref 78.0–100.0)
Platelets: 290 10*3/uL (ref 150–400)
RBC: 4.41 MIL/uL (ref 3.87–5.11)
RDW: 15.1 % (ref 11.5–15.5)
WBC: 9.4 10*3/uL (ref 4.0–10.5)

## 2013-09-30 LAB — I-STAT TROPONIN, ED
TROPONIN I, POC: 0 ng/mL (ref 0.00–0.08)
Troponin i, poc: 0 ng/mL (ref 0.00–0.08)

## 2013-09-30 LAB — PROTIME-INR
INR: 1.3 (ref 0.00–1.49)
PROTHROMBIN TIME: 16.2 s — AB (ref 11.6–15.2)

## 2013-09-30 LAB — PRO B NATRIURETIC PEPTIDE: Pro B Natriuretic peptide (BNP): 117.8 pg/mL (ref 0–125)

## 2013-09-30 MED ORDER — LEVOFLOXACIN 750 MG PO TABS: 750.0000 mg | ORAL_TABLET | Freq: Every day | ORAL | Status: DC

## 2013-09-30 MED ORDER — FUROSEMIDE 10 MG/ML IJ SOLN
40.0000 mg | Freq: Once | INTRAMUSCULAR | Status: AC
  Administered 2013-09-30: 40 mg via INTRAVENOUS
  Filled 2013-09-30: qty 4

## 2013-09-30 MED ORDER — IOHEXOL 350 MG/ML SOLN
100.0000 mL | Freq: Once | INTRAVENOUS | Status: AC | PRN
  Administered 2013-09-30: 100 mL via INTRAVENOUS

## 2013-09-30 NOTE — Discharge Instructions (Signed)

## 2013-09-30 NOTE — ED Provider Notes (Signed)
   This was a shared visit with a mid-level provided (NP or PA).  Throughout the patient's course I was available for consultation/collaboration.  I saw the ECG (if appropriate), relevant labs and studies - I agree with the interpretation.  On my exam the patient was in no distress.  Patient was awake and alert. She, her husband and I discussed all findings, including CT consistent with possible pneumonia. Given the absence of distress, he otherwise largely reassuring findings, the patient was appropriate for discharge with further evaluation, management as an outpatient. Patient will receive antibiotics, followup for echocardiogram in the next week, and to discuss all findings with her primary care physician as well.  A       EKG Interpretation   Date/Time:  Thursday September 30 2013 16:38:26 EDT Ventricular Rate:  70 PR Interval:  144 QRS Duration: 74 QT Interval:  386 QTC Calculation: 416 R Axis:   54 Text Interpretation:  Normal sinus rhythm Low voltage QRS ST abnormality,  possible digitalis effect Abnormal ECG Sinus rhythm Low voltage QRS ST-t  wave abnormality Abnormal ekg Confirmed by Gerhard Munch  MD ([id number]) on  09/30/2013 10:04:39 PM         Gerhard Munch, MD 09/30/13 2259

## 2013-09-30 NOTE — ED Notes (Signed)
IV attempt x 1; second RN attempting ultrasound IV

## 2013-09-30 NOTE — ED Provider Notes (Signed)
CSN: [id number]     Arrival date & time 09/30/13  1633 History   First MD Initiated Contact with Patient 09/30/13 1741     Chief Complaint  Patient presents with  . Chest Pain  . Shortness of Breath     (Consider location/radiation/quality/duration/timing/severity/associated sxs/prior Treatment) HPI Comments: Patient is a 63 year old female with history of paroxysmal atrial fibrillation, dyspnea, restrictive cardiomyopathy, congestive heart failure, hyperlipidemia who presents to the emergency department with sudden onset dyspnea. She reports that she was hanging clothes on a clothes hanger when this began. She had difficulty catching her breath. She feels improved now that she has been resting. Her shortness of breath is significantly worsened with any sort of movement. She reports that her chest feels tight. She has history of congestive heart failure and reports that this feels like a CHF exacerbation. She does light there is fluid in her lungs. She takes Lasix daily. She has been compliant with this, but states she feels that she's been urinating less. She is on pradaxa for her atrial fibrillation.   The history is provided by the patient. No language interpreter was used.    Past Medical History  Diagnosis Date  . Paroxysmal atrial fibrillation   . Migraine headache   . Dyspnea   . Restrictive cardiomyopathy   . Diastolic congestive heart failure   . Hyperlipemia   . Chronic anticoagulation   . Tubal pregnancy   . Back pain     nerve problems-had series of three injections   Past Surgical History  Procedure Laterality Date  . Cardiac catheterization    . Cardioversion    . Rotator cuff repair      bilateral  . Ectopic pregnancy surgery  1980  . Cholecystectomy    . Breast lumpectomy      benign  . Eye surgery      left eye injections x2-was having "stroke like" flashes of light  . Hysteroscopy  6/05    and D&C-simple hyperplasia secondary PMP bleeding   Family History   Problem Relation Age of Onset  . Hypertension Father   . Atrial fibrillation Mother   . Cancer Maternal Grandmother     mouth  . Cancer Maternal Uncle     unknown type  . Thyroid cancer Sister   . Breast cancer Sister 58  . Heart disease Sister     ? diag   History  Substance Use Topics  . Smoking status: Never Smoker   . Smokeless tobacco: Never Used  . Alcohol Use: 0.0 oz/week     Comment: 1 per month   OB History   Grav Para Term Preterm Abortions TAB SAB Ect Mult Living   1 0        0     Obstetric Comments   Tubal pregnancy     Review of Systems  Constitutional: Negative for fever and chills.  Respiratory: Positive for chest tightness and shortness of breath.   Cardiovascular: Positive for chest pain. Negative for leg swelling.  Gastrointestinal: Negative for nausea, vomiting and abdominal pain.  All other systems reviewed and are negative.     Allergies  Codeine; Oxycodone; and Penicillins  Home Medications   Prior to Admission medications   Medication Sig Start Date End Date Taking? Authorizing Provider  aspirin 81 MG tablet Take 81 mg by mouth daily.     Yes Historical Provider, MD  atorvastatin (LIPITOR) 20 MG tablet Take 20 mg by mouth every evening.  Yes Historical Provider, MD  dabigatran (PRADAXA) 150 MG CAPS capsule Take 150 mg by mouth daily.   Yes Historical Provider, MD  dofetilide (TIKOSYN) 125 MCG capsule Take 375 mcg by mouth 2 (two) times daily.   Yes Historical Provider, MD  furosemide (LASIX) 40 MG tablet Take 40 mg by mouth 2 (two) times daily.   Yes Historical Provider, MD  gabapentin (NEURONTIN) 600 MG tablet Take 600 mg by mouth at bedtime. Take 1.5 Tablets Daily   Yes Historical Provider, MD  lidocaine (XYLOCAINE) 2 % solution Use as directed 20 mLs in the mouth or throat daily as needed for mouth pain. Headache   Yes Historical Provider, MD  Methylsulfonylmethane (MSM PO) Take 1,000 mg by mouth every evening.    Yes Historical  Provider, MD  Multiple Vitamins-Minerals (VISION FORMULA PO) Take 1 tablet by mouth daily.    Yes Historical Provider, MD  potassium chloride SA (K-DUR,KLOR-CON) 20 MEQ tablet Take 40 mEq by mouth 2 (two) times daily.   Yes Historical Provider, MD  primidone (MYSOLINE) 50 MG tablet Take 25 mg by mouth daily.   Yes Historical Provider, MD   BP 105/60  Pulse 67  Temp(Src) 97.9 F (36.6 C) (Oral)  Resp 20  Wt 240 lb (108.863 kg)  SpO2 98%  LMP 01/21/1990 Physical Exam  Nursing note and vitals reviewed. Constitutional: She is oriented to person, place, and time. She appears well-developed and well-nourished. No distress.  HENT:  Head: Normocephalic and atraumatic.  Right Ear: External ear normal.  Left Ear: External ear normal.  Nose: Nose normal.  Mouth/Throat: Oropharynx is clear and moist.  Eyes: Conjunctivae are normal.  Neck: Normal range of motion.  Cardiovascular: Normal rate, regular rhythm, normal heart sounds, intact distal pulses and normal pulses.   Pulses:      Radial pulses are 2+ on the right side, and 2+ on the left side.       Posterior tibial pulses are 2+ on the right side, and 2+ on the left side.  Pulmonary/Chest: Effort normal and breath sounds normal. No stridor. No respiratory distress. She has no wheezes. She has no rales.  Abdominal: Soft. She exhibits no distension.  Musculoskeletal: Normal range of motion.  Neurological: She is alert and oriented to person, place, and time. She has normal strength.  Skin: Skin is warm and dry. She is not diaphoretic. No erythema.  Psychiatric: She has a normal mood and affect. Her behavior is normal.    ED Course  Procedures (including critical care time) Labs Review Labs Reviewed  CBC - Abnormal; Notable for the following:    Hemoglobin 11.9 (*)    All other components within normal limits  BASIC METABOLIC PANEL - Abnormal; Notable for the following:    Glucose, Bld 114 (*)    GFR calc non Af Amer 67 (*)    GFR  calc Af Amer 77 (*)    All other components within normal limits  PROTIME-INR - Abnormal; Notable for the following:    Prothrombin Time 16.2 (*)    All other components within normal limits  PRO B NATRIURETIC PEPTIDE  I-STAT TROPOININ, ED  I-STAT TROPOININ, ED    Imaging Review Dg Chest 2 View  09/30/2013   CLINICAL DATA:  Chest pain and shortness of breath.  EXAM: CHEST  2 VIEW  COMPARISON:  None available.  FINDINGS: The heart size is normal. Mild interstitial coarsening is evident. No focal airspace disease is present. The visualized soft tissues  and bony thorax are unremarkable.  IMPRESSION: 1. No acute cardiopulmonary disease. 2. Mild interstitial coarsening is likely chronic.   Electronically Signed   By: Gennette Pac M.D.   On: 09/30/2013 17:00   Ct Angio Chest Pe W/cm &/or Wo Cm  09/30/2013   CLINICAL DATA:  Chest pain, shortness of breath, evaluate for PE  EXAM: CT ANGIOGRAPHY CHEST WITH CONTRAST  TECHNIQUE: Multidetector CT imaging of the chest was performed using the standard protocol during bolus administration of intravenous contrast. Multiplanar CT image reconstructions and MIPs were obtained to evaluate the vascular anatomy.  CONTRAST:  OMNIPAQUE IOHEXOL 350 MG/ML SOLN  COMPARISON:  Chest radiographs dated 09/30/2013  FINDINGS: No evidence of pulmonary embolism.  Mild patchy opacity in the lateral left lower lobe (series 6/image 60), likely atelectasis. Additional minimal dependent atelectasis at the lung bases.  No suspicious pulmonary nodules. No pleural effusion or pneumothorax.  Visualized thyroid is unremarkable.  The heart is top-normal in size.  No pericardial effusion.  Small mediastinal lymph nodes measuring up to 7 mm shin short axis, within normal limits.  Visualized upper abdomen is notable for cholecystectomy clips.  Degenerative changes of the visualized thoracolumbar spine.  Review of the MIP images confirms the above findings.  IMPRESSION: No evidence of  pulmonary embolism.  Mild patchy opacity in the lateral left lower lobe, likely atelectasis.  No evidence of acute cardiopulmonary disease.   Electronically Signed   By: Charline Bills M.D.   On: 09/30/2013 20:35     EKG Interpretation   Date/Time:  Thursday September 30 2013 16:38:26 EDT Ventricular Rate:  70 PR Interval:  144 QRS Duration: 74 QT Interval:  386 QTC Calculation: 416 R Axis:   54 Text Interpretation:  Normal sinus rhythm Low voltage QRS ST abnormality,  possible digitalis effect Abnormal ECG Sinus rhythm Low voltage QRS ST-t  wave abnormality Abnormal ekg Confirmed by Gerhard Munch  MD [phone]) on  09/30/2013 10:04:39 PM      MDM   Final diagnoses:  Shortness of breath    Patient presents to ED for sudden onset dyspnea on exertion while helping fold clothes for a yard sale. Patient with history of CHF and reports she feels like there is fluid in her lungs. CT angio was done to rule out PE given sudden onset of DOE. CT shows mild patchy opacity in lateral left lower lobe which I will treat with antibiotics. Patient feels improved. She will follow up with her cardiologist, Dr. Elease Hashimoto for echo sometime next week and her PCP, Dr. Timothy Lasso. Discussed reasons to return to ED immediately. Dr. Jeraldine Loots evaluated patient and agrees with plan. Vital signs stable for discharge. Patient / Family / Caregiver informed of clinical course, understand medical decision-making process, and agree with plan.     Mora Bellman, PA-C 09/30/13 2221

## 2013-09-30 NOTE — ED Notes (Signed)
Pt c/o SOB and mid sternal CP starting today; pt sts hx of CHF and afib

## 2013-09-30 NOTE — ED Notes (Signed)
ekg shown to Dr ward

## 2013-09-30 NOTE — ED Notes (Addendum)
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 Pt has hx of afib and CHF. Reports feeling like afib starts occasionally; typically afib is controlled with medications

## 2013-10-04 ENCOUNTER — Telehealth: Payer: Self-pay | Admitting: Cardiovascular Disease

## 2013-10-04 NOTE — Telephone Encounter (Signed)
New problem   Pt was seen in the ER and was told she need to have an Echocardiogram. Pt would like to speak to nurse concerning this.

## 2013-10-04 NOTE — Telephone Encounter (Signed)
Spoke with patient who reports last Thursday she experienced sudden episode of severe SOB and some mild chest pain.  Patient was evaluated at [facility] ED and was d/c'ed with an antibiotic for possible early pneumonia per patient; states she was advised to f/u with cardiologist for possible ECHO.  Patient states she is feeling better today - states this is first day she has felt that her breathing was normal and that she has some energy.  Patient has hx of atrial fib and states that she has recently felt occasions with she thought she had a brief run of atrial fib.  I advised patient that she will need evaluation by Dr. Elease Hashimoto and scheduled her for ov on Wednesday 9/16.  Patient verbalized understanding and gratitude.

## 2013-10-06 ENCOUNTER — Ambulatory Visit (INDEPENDENT_AMBULATORY_CARE_PROVIDER_SITE_OTHER): Payer: BC Managed Care – PPO | Admitting: Cardiovascular Disease

## 2013-10-06 ENCOUNTER — Encounter: Payer: Self-pay | Admitting: Cardiovascular Disease

## 2013-10-06 VITALS — BP 128/80 | HR 65 | Ht 69.0 in | Wt 241.4 lb

## 2013-10-06 DIAGNOSIS — R0989 Other specified symptoms and signs involving the circulatory and respiratory systems: Secondary | ICD-10-CM

## 2013-10-06 DIAGNOSIS — R002 Palpitations: Secondary | ICD-10-CM

## 2013-10-06 DIAGNOSIS — R0609 Other forms of dyspnea: Secondary | ICD-10-CM

## 2013-10-06 DIAGNOSIS — I480 Paroxysmal atrial fibrillation: Secondary | ICD-10-CM

## 2013-10-06 DIAGNOSIS — I4891 Unspecified atrial fibrillation: Secondary | ICD-10-CM

## 2013-10-06 DIAGNOSIS — I5032 Chronic diastolic (congestive) heart failure: Secondary | ICD-10-CM

## 2013-10-06 DIAGNOSIS — R0600 Dyspnea, unspecified: Secondary | ICD-10-CM

## 2013-10-06 MED ORDER — SPIRONOLACTONE 25 MG PO TABS: 12.5000 mg | ORAL_TABLET | Freq: Every day | ORAL | Status: DC

## 2013-10-06 NOTE — Assessment & Plan Note (Signed)
She has had persistent / intermittant dyspnea. We seen in the ER and CT angio was negative for PE. Thought to possibly have pneumonia  Will get an echo to assess LV function. Will add aldactone 12. 5 a day.  Check BMP in 2 weeks.  Will see her in 6 months  For ov.

## 2013-10-06 NOTE — Progress Notes (Signed)
[name] Date of Birth  1950-04-21 [facility] [address]    [address] [place], [place]  [address] [phone]  Fax  [phone]  Problems List 1. Atrial fibrillation 2. Hyperlipidemia 3. Diastolic congestive heart failure   History of Present Illness:  63 year old female with a history of atrial fibrillation. She's been well controlled on Tikosyn. She also has a history of hyperlipidemia and is on Lipitor. She's been on chronic Pradaxa therapy for anticoagulation.  February 20, 2012 [name]  has done well since I last saw her. 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 She has occasional palpitations but overall she's doing quite well.  She is exercising ( water aerobics) twice a week.  Feb. 24, 2015:  [name] is doing well.   No problems.  Maintaining NSR.  Sept. 16, 2015:  She was in the ER this past week.  CP, dyspnea .. CT angio was negative for PE - showed ? Of pneumonia.   Was treated wit Abx. Does well in the am's .[name] and perhaps more short of breath in the afternoons.     Current Outpatient Prescriptions on File Prior to Visit  Medication Sig Dispense Refill  . aspirin 81 MG tablet Take 81 mg by mouth daily.        [name] atorvastatin (LIPITOR) 20 MG tablet Take 20 mg by mouth every evening.       . dabigatran (PRADAXA) 150 MG CAPS capsule Take 150 mg by mouth daily.      [name] dofetilide (TIKOSYN) 125 MCG capsule Take 375 mcg by mouth 2 (two) times daily.      . furosemide (LASIX) 40 MG tablet Take 40 mg by mouth 2 (two) times daily.      [name] gabapentin (NEURONTIN) 600 MG tablet Take 600 mg by mouth at bedtime. Take 1.5 Tablets Daily      . levofloxacin (LEVAQUIN) 750 MG tablet Take 1 tablet (750 mg total) by mouth daily. X 7 days  7 tablet  0  . lidocaine (XYLOCAINE) 2 % solution Use as directed 20 mLs in the mouth or throat daily as needed for mouth pain. Headache      . Methylsulfonylmethane (MSM PO) Take 1,000 mg by mouth  every evening.       . Multiple Vitamins-Minerals (VISION FORMULA PO) Take 1 tablet by mouth daily.       . potassium chloride SA (K-DUR,KLOR-CON) 20 MEQ tablet Take 40 mEq by mouth 2 (two) times daily.      . primidone (MYSOLINE) 50 MG tablet Take 25 mg by mouth daily.       No current facility-administered medications on file prior to visit.    Allergies  Allergen Reactions  . Codeine     VERTIGO, NAUSEA & VOMITTING  . Oxycodone     Hives   . Penicillins Rash    Past Medical History  Diagnosis Date  . Paroxysmal atrial fibrillation   . Migraine headache   . Dyspnea   . Restrictive cardiomyopathy   . Diastolic congestive heart failure   . Hyperlipemia   . Chronic anticoagulation   . Tubal pregnancy   . Back pain     nerve problems-had series of three injections    Past Surgical History  Procedure Laterality Date  . Cardiac catheterization    . Cardioversion    . Rotator cuff repair      bilateral  . Ectopic pregnancy surgery  1980  . Cholecystectomy    .  Breast lumpectomy      benign  . Eye surgery      left eye injections x2-was having "stroke like" flashes of light  . Hysteroscopy  6/05    and D&C-simple hyperplasia secondary PMP bleeding    History  Smoking status  . Never Smoker   Smokeless tobacco  . Never Used    History  Alcohol Use  . 0.0 oz/week    Comment: 1 per month    Family History  Problem Relation Age of Onset  . Hypertension Father   . Atrial fibrillation Mother   . Cancer Maternal Grandmother     mouth  . Cancer Maternal Uncle     unknown type  . Thyroid cancer Sister   . Breast cancer Sister 67  . Heart disease Sister     ? diag    Reviw of Systems:  Reviewed in the HPI.  All other systems are negative.  Physical Exam: BP 128/80  Pulse 65  Ht 5\' 9"  (1.753 m)  Wt 241 lb 6.4 oz (109.498 kg)  BMI 35.63 kg/m2  LMP 01/21/1990 The patient is alert and oriented x 3.  The mood and affect are normal.   Skin: warm and dry.   Color is normal.    HEENT:   the sclera are nonicteric.  The mucous membranes are moist.  The carotids are 2+ without bruits.  There is no thyromegaly.  There is no JVD.    Lungs: clear.  The chest wall is non tender.    Heart: regular rate with a normal S1 and S2.  There are no murmurs, gallops, or rubs. The PMI is not displaced.     Abdomen: good bowel sounds.  There is no guarding or rebound.  There is no hepatosplenomegaly or tenderness.  There are no masses.   Extremities:  Trace ankle edema.  The legs are without rashes.  The distal pulses are intact.   Neuro:  Cranial nerves II - XII are intact.  Motor and sensory functions are intact.    The gait is normal.  ECG: Sept. 15, 2015:  NSR at 65.  Low voltage QRS.   Assessment / Plan:

## 2013-10-06 NOTE — Assessment & Plan Note (Signed)
[name] is doing ok.  She was in the ER last week.  Her symptoms have resolved. Was treated for pneumonia. CT angio was negative for PE  Remains in NSR. Continue Tikosyn and Pradaxa

## 2013-10-06 NOTE — Patient Instructions (Signed)
Your physician has requested that you have an echocardiogram. Echocardiography is a painless test that uses sound waves to create images of your heart. It provides your doctor with information about the size and shape of your heart and how well your heart's chambers and valves are working. This procedure takes approximately one hour. There are no restrictions for this procedure.  Your physician has recommended you make the following change in your medication:  START Aldactone 12.5 mg once daily  Your physician recommends that you return for lab work in: 2 weeks for Basic Metabolic Panel  Your physician wants you to follow-up in: 6 months with Dr. Elease Hashimoto.  You will receive a reminder letter in the mail two months in advance. If you don't receive a letter, please call our office to schedule the follow-up appointment.

## 2013-10-07 ENCOUNTER — Ambulatory Visit (HOSPITAL_COMMUNITY): Payer: BC Managed Care – PPO | Attending: Cardiology | Admitting: Radiology

## 2013-10-07 DIAGNOSIS — R0602 Shortness of breath: Secondary | ICD-10-CM | POA: Diagnosis not present

## 2013-10-07 DIAGNOSIS — I5032 Chronic diastolic (congestive) heart failure: Secondary | ICD-10-CM

## 2013-10-07 DIAGNOSIS — I428 Other cardiomyopathies: Secondary | ICD-10-CM

## 2013-10-07 DIAGNOSIS — R0600 Dyspnea, unspecified: Secondary | ICD-10-CM

## 2013-10-07 DIAGNOSIS — I480 Paroxysmal atrial fibrillation: Secondary | ICD-10-CM

## 2013-10-07 DIAGNOSIS — E669 Obesity, unspecified: Secondary | ICD-10-CM | POA: Diagnosis not present

## 2013-10-07 DIAGNOSIS — I509 Heart failure, unspecified: Secondary | ICD-10-CM | POA: Insufficient documentation

## 2013-10-07 DIAGNOSIS — E785 Hyperlipidemia, unspecified: Secondary | ICD-10-CM | POA: Insufficient documentation

## 2013-10-07 NOTE — Progress Notes (Signed)
Echocardiogram performed.  

## 2013-10-21 ENCOUNTER — Telehealth: Payer: Self-pay | Admitting: Nurse Practitioner

## 2013-10-21 ENCOUNTER — Other Ambulatory Visit (INDEPENDENT_AMBULATORY_CARE_PROVIDER_SITE_OTHER): Payer: BC Managed Care – PPO | Admitting: *Deleted

## 2013-10-21 DIAGNOSIS — R002 Palpitations: Secondary | ICD-10-CM

## 2013-10-21 DIAGNOSIS — R0600 Dyspnea, unspecified: Secondary | ICD-10-CM

## 2013-10-21 LAB — BASIC METABOLIC PANEL
BUN: 17 mg/dL (ref 6–23)
CALCIUM: 9.4 mg/dL (ref 8.4–10.5)
CO2: 28 mEq/L (ref 19–32)
Chloride: 101 mEq/L (ref 96–112)
Creatinine, Ser: 0.9 mg/dL (ref 0.4–1.2)
GFR: 64.62 mL/min (ref >60.00)
Glucose, Bld: 117 mg/dL — ABNORMAL HIGH (ref 70–99)
POTASSIUM: 3.7 meq/L (ref 3.5–5.1)
SODIUM: 137 meq/L (ref 135–145)

## 2013-10-21 NOTE — Telephone Encounter (Signed)
Patient asked to speak to me when she came into the office earlier today for lab appointment.  I was not able to speak with patient so I called and spoke with patient's husband and apologized for not getting to talk with her in the office today. Patient's question is regarding Atorvastatin which is causing muscle aches.  Patient would like to stop medication and see if symptoms improve.  I advised patient's husband that medication can be stopped for 2 weeks to see if symptoms improve.  Dr. Timothy Lasso, PCP, got most recent lipid results and we do not have access to that information.  Order for lipid profile added to bmet ordered earlier today.  I advised patient's husband that I will discuss with Dr. Elease Hashimoto Monday when lab results are complete and will call back and advise patient on plan if Atorvastatin is not going to be tolerated.  Patient's husband verbalized understanding and agreement.

## 2013-10-22 ENCOUNTER — Other Ambulatory Visit (INDEPENDENT_AMBULATORY_CARE_PROVIDER_SITE_OTHER): Payer: BC Managed Care – PPO

## 2013-10-22 DIAGNOSIS — E7800 Pure hypercholesterolemia, unspecified: Secondary | ICD-10-CM

## 2013-10-22 LAB — LIPID PANEL
CHOLESTEROL: 161 mg/dL (ref 0–200)
HDL: 41.6 mg/dL (ref >39.00)
LDL Cholesterol: 87 mg/dL (ref 0–99)
NONHDL: 119.4
TRIGLYCERIDES: 163 mg/dL — AB (ref 0.0–149.0)
Total CHOL/HDL Ratio: 4
VLDL: 32.6 mg/dL (ref 0.0–40.0)

## 2013-10-25 NOTE — Telephone Encounter (Signed)
Spoke with patient and advised of lipid results; patient is agreeable to work on diet and exercise.  I reviewed plan of care with patient to hold Atorvastatin for a total of 2 weeks and call back with results.  Patient is agreeable to come in for lipid clinic appointment if she is unable to tolerate the  Atorvastatin.

## 2013-10-25 NOTE — Telephone Encounter (Signed)
Follow up ° ° ° ° °Returning a nurses call °

## 2013-10-25 NOTE — Telephone Encounter (Signed)
I agree with seeing if she improves with holding the med. She could go to the lipid clinic if she determines that it is causing muscle aches.

## 2013-10-26 ENCOUNTER — Telehealth: Payer: Self-pay

## 2013-10-26 DIAGNOSIS — E876 Hypokalemia: Secondary | ICD-10-CM

## 2013-10-26 NOTE — Patient Instructions (Signed)
Patient notified

## 2013-10-26 NOTE — Patient Instructions (Signed)
Patient informed. Just received a 90 day mail order supply of her K-dur, so will increase it to 40 meq tid, and recheck labs 10/23.

## 2013-11-01 NOTE — Telephone Encounter (Signed)
This encounter was created in error - please disregard.

## 2013-11-04 ENCOUNTER — Ambulatory Visit (INDEPENDENT_AMBULATORY_CARE_PROVIDER_SITE_OTHER): Payer: BC Managed Care – PPO | Admitting: Obstetrics & Gynecology

## 2013-11-04 ENCOUNTER — Encounter: Payer: Self-pay | Admitting: Obstetrics & Gynecology

## 2013-11-04 VITALS — BP 126/74 | HR 68 | Ht 69.0 in | Wt 242.0 lb

## 2013-11-04 DIAGNOSIS — K429 Umbilical hernia without obstruction or gangrene: Secondary | ICD-10-CM

## 2013-11-04 DIAGNOSIS — Z124 Encounter for screening for malignant neoplasm of cervix: Secondary | ICD-10-CM

## 2013-11-04 DIAGNOSIS — Z01419 Encounter for gynecological examination (general) (routine) without abnormal findings: Secondary | ICD-10-CM

## 2013-11-04 NOTE — Progress Notes (Signed)
63 y.o. G1P0 MarriedCaucasianF here for annual exam.  Had an episode of SOB in September.  Went to the ER.  Didn't have any new heart issues.  Was more lung related.  Cardiologist:  Dr. Melburn PopperNasher.  Has follow up next Friday.  Sees Dr. Timothy Lassousso ever six months.     Patient's last menstrual period was 01/21/1990.          Sexually active: No.  The current method of family planning is post menopausal status.    Exercising: Yes.    Gym/ health club routine includes water aerobics. Smoker:  no   Health Maintenance: Pap:  09/13/11 WNL/neg HR HPV History of abnormal Pap:  no MMG:  06/18/13 Bi-Rads Neg Colonoscopy:  2008 repeat in 10 years BMD:   2011, done in Dr. Timothy Lassousso TDaP:  Will be done at Dr. Timothy Lassousso.  Will do with next follow up. Screening Labs: pcp, Hb today: pcp, Urine today: pcp   reports that she has never smoked. She has never used smokeless tobacco. She reports that she drinks alcohol. She reports that she does not use illicit drugs.  Past Medical History  Diagnosis Date  . Paroxysmal atrial fibrillation   . Migraine headache   . Dyspnea   . Restrictive cardiomyopathy   . Diastolic congestive heart failure   . Hyperlipemia   . Chronic anticoagulation   . Tubal pregnancy   . Back pain     nerve problems-had series of three injections    Past Surgical History  Procedure Laterality Date  . Cardiac catheterization    . Cardioversion    . Rotator cuff repair      bilateral  . Ectopic pregnancy surgery  1980  . Cholecystectomy    . Breast lumpectomy      benign  . Eye surgery      left eye injections x2-was having "stroke like" flashes of light  . Hysteroscopy  6/05    and D&C-simple hyperplasia secondary PMP bleeding    Current Outpatient Prescriptions  Medication Sig Dispense Refill  . aspirin 81 MG tablet Take 81 mg by mouth daily.        [name]. atorvastatin (LIPITOR) 20 MG tablet Take 20 mg by mouth every evening.       . dabigatran (PRADAXA) 150 MG CAPS capsule Take 150 mg by  mouth daily.      [name]. dofetilide (TIKOSYN) 125 MCG capsule Take 375 mcg by mouth 2 (two) times daily.      . furosemide (LASIX) 40 MG tablet Take 40 mg by mouth 2 (two) times daily.      [name]. gabapentin (NEURONTIN) 600 MG tablet Take 600 mg by mouth at bedtime. Take 1.5 Tablets Daily      . lidocaine (XYLOCAINE) 2 % solution Use as directed 20 mLs in the mouth or throat daily as needed for mouth pain. Headache      . Methylsulfonylmethane (MSM PO) Take 1,000 mg by mouth every evening.       . Multiple Vitamins-Minerals (VISION FORMULA PO) Take 1 tablet by mouth daily.       . potassium chloride SA (K-DUR,KLOR-CON) 20 MEQ tablet Take 40 mEq by mouth 3 (three) times daily.      . primidone (MYSOLINE) 50 MG tablet Take 25 mg by mouth daily.      [name]. spironolactone (ALDACTONE) 25 MG tablet Take 0.5 tablets (12.5 mg total) by mouth daily.  31 tablet  11   No current facility-administered medications for this  visit.    Family History  Problem Relation Age of Onset  . Hypertension Father   . Atrial fibrillation Mother   . Cancer Maternal Grandmother     mouth  . Cancer Maternal Uncle     unknown type  . Thyroid cancer Sister   . Breast cancer Sister 68  . Heart disease Sister     ? diag    ROS:  Pertinent items are noted in HPI.  Otherwise, a comprehensive ROS was negative.  Exam:   BP 126/74  Pulse 68  Ht 5\' 9"  (1.753 m)  Wt 242 lb (109.77 kg)  BMI 35.72 kg/m2  LMP 01/21/1990  Weight change: +1#  Height:   Height: 5\' 9"  (175.3 cm)  Ht Readings from Last 3 Encounters:  11/04/13 5\' 9"  (1.753 m)  10/06/13 5\' 9"  (1.753 m)  03/16/13 5' 9.25" (1.759 m)    General appearance: alert, cooperative and appears stated age Head: Normocephalic, without obvious abnormality, atraumatic Neck: no adenopathy, supple, symmetrical, trachea midline and thyroid normal to inspection and palpation Lungs: clear to auscultation bilaterally Breasts: normal appearance, no masses or tenderness Heart: regular  rate and rhythm Abdomen: soft, non-tender; bowel sounds normal; 5cm umbilical hernia, enlarged from last visit Extremities: extremities normal, atraumatic, no cyanosis or edema Skin: Skin color, texture, turgor normal. No rashes or lesions Lymph nodes: Cervical, supraclavicular, and axillary nodes normal. No abnormal inguinal nodes palpated Neurologic: Grossly normal   Pelvic: External genitalia:  no lesions              Urethra:  normal appearing urethra with no masses, tenderness or lesions              Bartholins and Skenes: normal                 Vagina: normal appearing vagina with normal color and discharge, no lesions              Cervix: no lesions              Pap taken: Yes.   Bimanual Exam:  Uterus:  normal size, contour, position, consistency, mobility, non-tender              Adnexa: normal adnexa and no mass, fullness, tenderness               Rectovaginal: Confirms               Anus:  normal sphincter tone, no lesions  A:  Well Woman with normal exam  PMP, no HRT  H/O paroxysmal atrial fibrillation, on baby ASA and Pradaxa  H/O heart failure due to unknown etiology  New SOB issues Enlarging hernia  P: Mammogram yearly. Recommended 3D due to dense breasts  pap smear with neg HR HPV 8/13.  Pap today.  Labs and vaccines with Dr. Timothy Lasso. Goes every six months.  Dr. Melburn Popper, cardiologist.  Being seen regularly.   Referral to general surgery return annually or prn  An After Visit Summary was printed and given to the patient.

## 2013-11-08 LAB — IPS PAP TEST WITH REFLEX TO HPV

## 2013-11-09 ENCOUNTER — Telehealth: Payer: Self-pay

## 2013-11-09 ENCOUNTER — Telehealth: Payer: Self-pay | Admitting: Cardiovascular Disease

## 2013-11-09 NOTE — Telephone Encounter (Signed)
Lmtcb//kn 

## 2013-11-09 NOTE — Telephone Encounter (Signed)
New message     Calling to give an update Pt stopped statin 2wks ago because of joint pain.  Today, she is feeling better.  Pt is coming in for labs on Friday.

## 2013-11-09 NOTE — Telephone Encounter (Signed)
Message copied by Elisha Headland on Tue Nov 09, 2013  9:00 AM ------      Message from: Jerene Bears      Created: Mon Nov 08, 2013 11:20 PM       Please call pt.  Pap normal.  Yeast present.  If symptomatic, ok to treat with diflucan 150mg  po x 1, repeat 48 hours.  Thanks.  02 recall. ------

## 2013-11-09 NOTE — Telephone Encounter (Signed)
Returning call.

## 2013-11-12 ENCOUNTER — Other Ambulatory Visit (INDEPENDENT_AMBULATORY_CARE_PROVIDER_SITE_OTHER): Payer: BC Managed Care – PPO | Admitting: *Deleted

## 2013-11-12 DIAGNOSIS — E876 Hypokalemia: Secondary | ICD-10-CM

## 2013-11-12 LAB — BASIC METABOLIC PANEL
BUN: 18 mg/dL (ref 6–23)
CHLORIDE: 105 meq/L (ref 96–112)
CO2: 26 meq/L (ref 19–32)
Calcium: 9.4 mg/dL (ref 8.4–10.5)
Creatinine, Ser: 1 mg/dL (ref 0.4–1.2)
GFR: 58.08 mL/min — ABNORMAL LOW (ref >60.00)
Glucose, Bld: 86 mg/dL (ref 70–99)
POTASSIUM: 4.2 meq/L (ref 3.5–5.1)
Sodium: 138 mEq/L (ref 135–145)

## 2013-11-12 NOTE — Telephone Encounter (Signed)
[name] was told to call back in two weeks, she did and has not heard back from the office. She just wanted to let Dr. Elease Hashimoto know her joints are feeling much better now that she is off of the statins. -KG

## 2013-11-15 NOTE — Telephone Encounter (Signed)
Spoke with patient and advised of BMET results obtained for f/u on starting Aldactone in September per Dr. Elease Hashimoto advice at 9/16 ov.  Patient states she is tolerating well; will continue on current dose of medication I reviewed patient's concern that Lipitor is causing muscle pain; patient states pain is not completely gone since stopping Atorvastatin but it has decreased significantly.  Patient agreeable to come in to lipid clinic and appointment made.

## 2013-11-15 NOTE — Telephone Encounter (Signed)
Patient notified of results-see result note.//kn 

## 2013-11-15 NOTE — Telephone Encounter (Signed)
Patient verbalized understanding and agreement with plan of care.

## 2013-11-15 NOTE — Addendum Note (Signed)
Addended by: Levi Aland on: 11/15/2013 02:42 PM   Modules accepted: Orders, Medications

## 2013-11-19 ENCOUNTER — Ambulatory Visit (INDEPENDENT_AMBULATORY_CARE_PROVIDER_SITE_OTHER): Payer: BC Managed Care – PPO | Admitting: Pharmacist

## 2013-11-19 DIAGNOSIS — E785 Hyperlipidemia, unspecified: Secondary | ICD-10-CM

## 2013-11-22 ENCOUNTER — Encounter: Payer: Self-pay | Admitting: Obstetrics & Gynecology

## 2013-11-23 NOTE — Progress Notes (Signed)
History of Present Illness:  [name] is a 63 year old female referred to the Lipid Clinic by Dr. Elease Hashimoto.  She has a PMH significant for Afib and CHF; no history of CAD.  Family history negative for CAD.  She does not have HTN or DM and is not a smoker.  She has previously been treated with Crestor, which made her nauseated after 3 doses and Lipitor.  She took the Lipitor for several years and recently developed joint pain and decreased strength in her hands. She stopped Lipitor and these symptoms have resolved at this time.    She stays active by doing water aerobics and silver sneakers yoga.  Her husband is diabetic and he is the cook in the family. She tries to control her diet through portion control.   Risk factors- none  LDL goal 130mg /dL  Labs:  53/6644: TC 034, TG 163, HDL 42, LDL 87 (on Lipitor)  Current Outpatient Prescriptions on File Prior to Visit  Medication Sig Dispense Refill  . aspirin 81 MG tablet Take 81 mg by mouth daily.      . dabigatran (PRADAXA) 150 MG CAPS capsule Take 150 mg by mouth daily.    [name] dofetilide (TIKOSYN) 125 MCG capsule Take 375 mcg by mouth 2 (two) times daily.    . furosemide (LASIX) 40 MG tablet Take 40 mg by mouth 2 (two) times daily.    [name] gabapentin (NEURONTIN) 600 MG tablet Take 900 mg by mouth at bedtime.     . lidocaine (XYLOCAINE) 2 % solution Use as directed 20 mLs in the mouth or throat daily as needed for mouth pain. Headache    . Methylsulfonylmethane (MSM PO) Take 1,000 mg by mouth every evening.     . Multiple Vitamins-Minerals (VISION FORMULA PO) Take 1 tablet by mouth daily.     . potassium chloride SA (K-DUR,KLOR-CON) 20 MEQ tablet Take 40 mEq by mouth 3 (three) times daily.    . primidone (MYSOLINE) 50 MG tablet Take 25 mg by mouth daily.    [name] spironolactone (ALDACTONE) 25 MG tablet Take 0.5 tablets (12.5 mg total) by mouth daily. 31 tablet 11   No current facility-administered medications on file prior to visit.    Allergies   Allergen Reactions  . Codeine     VERTIGO, NAUSEA & VOMITTING  . Oxycodone     Hives   . Penicillins Rash

## 2013-11-23 NOTE — Assessment & Plan Note (Signed)
Given pt's risk factors, no compelling indication for pt to be on statin.  She has failed 2 therapies at this time.  Family history is negative and she has no risk factors.   Will have patient stay off statin for now.  Will follow up with Dr. Timothy Lasso to make sure there is not a reason for to try additional statin.

## 2014-01-24 ENCOUNTER — Other Ambulatory Visit: Payer: Self-pay | Admitting: Cardiovascular Disease

## 2014-03-08 ENCOUNTER — Telehealth: Payer: Self-pay | Admitting: Cardiovascular Disease

## 2014-03-08 NOTE — Telephone Encounter (Signed)
Follow Up    Pt was seen by Kennon Rounds 10/30 as new lipid pt. Pt states she was supposed to hear from clinic in January for follow up but has not received a call. Pt calling to check status.

## 2014-04-05 ENCOUNTER — Ambulatory Visit (INDEPENDENT_AMBULATORY_CARE_PROVIDER_SITE_OTHER): Payer: BC Managed Care – PPO | Admitting: Cardiovascular Disease

## 2014-04-05 ENCOUNTER — Encounter: Payer: Self-pay | Admitting: Cardiovascular Disease

## 2014-04-05 ENCOUNTER — Other Ambulatory Visit: Payer: Self-pay | Admitting: Cardiovascular Disease

## 2014-04-05 VITALS — BP 130/84 | HR 66 | Ht 69.0 in | Wt 241.1 lb

## 2014-04-05 DIAGNOSIS — E785 Hyperlipidemia, unspecified: Secondary | ICD-10-CM

## 2014-04-05 DIAGNOSIS — I5032 Chronic diastolic (congestive) heart failure: Secondary | ICD-10-CM

## 2014-04-05 DIAGNOSIS — I480 Paroxysmal atrial fibrillation: Secondary | ICD-10-CM

## 2014-04-05 DIAGNOSIS — R0600 Dyspnea, unspecified: Secondary | ICD-10-CM

## 2014-04-05 MED ORDER — SPIRONOLACTONE 25 MG PO TABS: 25.0000 mg | ORAL_TABLET | Freq: Every day | ORAL | Status: DC

## 2014-04-05 NOTE — Progress Notes (Signed)
Cardiology Office Note   Date:  04/05/2014   ID:  [name], DOB [birth year], MRN [id number]  PCP:  Gwen Pounds, MD  Cardiologist:   Vesta Mixer, MD   Chief Complaint  Patient presents with  . Follow-up    atrial fib   1. Atrial fibrillation 2. Hyperlipidemia 3. Diastolic congestive heart failure   History of Present Illness:  64 year old female with a history of atrial fibrillation. She's been well controlled on Tikosyn. She also has a history of hyperlipidemia and is on Lipitor. She's been on chronic Pradaxa therapy for anticoagulation.  February 20, 2012 [name] has done well since I last saw her. 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 She has occasional palpitations but overall she's doing quite well. She is exercising ( water aerobics) twice a week.  Feb. 24, 2015:  [name] is doing well. No problems. Maintaining NSR.  Sept. 16, 2015:  She was in the ER this past week. CP, dyspnea .. CT angio was negative for PE - showed ? Of pneumonia. Was treated wit Abx. Does well in the am's .[name] and perhaps more short of breath in the afternoons.   April 05, 2014: [name] is a 64 y.o. female who presents for follow-up of her atrial fibrillation. She has continued to have some DOE,  Is concerned about the atorvastatin. Was seen in the lipid clinic.     Past Medical History  Diagnosis Date  . Paroxysmal atrial fibrillation   . Migraine headache   . Dyspnea   . Restrictive cardiomyopathy   . Diastolic congestive heart failure   . Hyperlipemia   . Chronic anticoagulation   . Tubal pregnancy   . Back pain     nerve problems-had series of three injections    Past Surgical History  Procedure Laterality Date  . Cardiac catheterization    . Cardioversion    . Rotator cuff repair      bilateral  . Ectopic pregnancy surgery  1980  . Cholecystectomy    . Breast lumpectomy      benign  . Eye surgery     left eye injections x2-was having "stroke like" flashes of light  . Hysteroscopy  6/05    and D&C-simple hyperplasia secondary PMP bleeding     Current Outpatient Prescriptions  Medication Sig Dispense Refill  . aspirin 81 MG tablet Take 81 mg by mouth daily.      . dabigatran (PRADAXA) 150 MG CAPS capsule Take 150 mg by mouth daily.    . furosemide (LASIX) 40 MG tablet TAKE 1 TABLET TWICE A DAY 180 tablet 1  . gabapentin (NEURONTIN) 600 MG tablet Take 900 mg by mouth at bedtime.     . lidocaine (XYLOCAINE) 2 % solution Use as directed 20 mLs in the mouth or throat daily as needed for mouth pain. Headache    . Methylsulfonylmethane (MSM PO) Take 1,000 mg by mouth every evening.     . Multiple Vitamins-Minerals (VISION FORMULA PO) Take 1 tablet by mouth daily.     . potassium chloride SA (K-DUR,KLOR-CON) 20 MEQ tablet Take 40 mEq by mouth 3 (three) times daily.    . primidone (MYSOLINE) 50 MG tablet Take 25 mg by mouth daily.    [name] spironolactone (ALDACTONE) 25 MG tablet Take 0.5 tablets (12.5 mg total) by mouth daily. 31 tablet 11  . TIKOSYN 125 MCG capsule TAKE 3 CAPSULES TWICE A DAY 540 capsule 1  No current facility-administered medications for this visit.    Allergies:   Codeine; Oxycodone; and Penicillins    Social History:  The patient  reports that she has never smoked. She has never used smokeless tobacco. She reports that she drinks alcohol. She reports that she does not use illicit drugs.   Family History:  [name] family history includes Atrial fibrillation in her mother; Breast cancer (age of onset: 90) in her sister; Cancer in her maternal grandmother and maternal uncle; Heart disease in her sister; Hypertension in her father; Thyroid cancer in her sister.    ROS:  Please see the history of present illness.    Review of Systems: Constitutional:  denies fever, chills, diaphoresis, appetite change and fatigue.  HEENT: denies photophobia, eye pain, redness, hearing  loss, ear pain, congestion, sore throat, rhinorrhea, sneezing, neck pain, neck stiffness and tinnitus.  Respiratory: admits to SOB, DOE, cough, chest tightness, and wheezing.  Cardiovascular: denies chest pain, palpitations and leg swelling.  Gastrointestinal: denies nausea, vomiting, abdominal pain, diarrhea, constipation, blood in stool.  Genitourinary: denies dysuria, urgency, frequency, hematuria, flank pain and difficulty urinating.  Musculoskeletal: denies  myalgias, back pain, joint swelling, arthralgias and gait problem.   Skin: denies pallor, rash and wound.  Neurological: denies dizziness, seizures, syncope, weakness, light-headedness, numbness and headaches.   Hematological: denies adenopathy, easy bruising, personal or family bleeding history.  Psychiatric/ Behavioral: denies suicidal ideation, mood changes, confusion, nervousness, sleep disturbance and agitation.       All other systems are reviewed and negative.    PHYSICAL EXAM: VS:  BP 130/84 mmHg  Pulse 66  Ht 5\' 9"  (1.753 m)  Wt 241 lb 1.9 oz (109.371 kg)  BMI 35.59 kg/m2  LMP 01/21/1990 , BMI Body mass index is 35.59 kg/(m^2). GEN: Well nourished, well developed, in no acute distress HEENT: normal Neck: no JVD, carotid bruits, or masses Cardiac: RRR; no murmurs, rubs, or gallops,no edema  Respiratory:  clear to auscultation bilaterally, normal work of breathing GI: soft, nontender, nondistended, + BS MS: no deformity or atrophy Skin: warm and dry, no rash Neuro:  Strength and sensation are intact Psych: normal   EKG:  EKG is ordered today. The ekg ordered today demonstrates NSR at 66, no ST or T wave abn.    Recent Labs: 09/30/2013: Hemoglobin 11.9*; Platelets 290; Pro B Natriuretic peptide (BNP) 117.8 11/12/2013: BUN 18; Creatinine 1.0; Potassium 4.2; Sodium 138    Lipid Panel    Component Value Date/Time   CHOL 161 10/22/2013 1501   TRIG 163.0* 10/22/2013 1501   HDL 41.60 10/22/2013 1501    CHOLHDL 4 10/22/2013 1501   VLDL 32.6 10/22/2013 1501   LDLCALC 87 10/22/2013 1501      Wt Readings from Last 3 Encounters:  04/05/14 241 lb 1.9 oz (109.371 kg)  11/04/13 242 lb (109.77 kg)  10/06/13 241 lb 6.4 oz (109.498 kg)      Other studies Reviewed: Additional studies/ records that were reviewed today include: . Review of the above records demonstrates:    ASSESSMENT AND PLAN:  1. Atrial fibrillation - she is currently maintaining normal sinus rhythm. She's currently on Tikosyn and Pradaxa. 2. Hyperlipidemia- she was not able to tolerate the atorvastatin. We'll set her back up to see the lipid clinic. 3. Diastolic congestive heart failure - she continues to have problems with shortness of breath with exertion. We will increase the Aldactone to 25 g a day. We will reduce the potassium chloride to 20  mEq twice a day. We'll check a basic medical profile in one week.  If she continues to have shortness of breath with exertion, we will consider referring her to the heart failure clinic. She is already seen the pulmonary doctors.   Current medicines are reviewed at length with the patient today.  The patient does not have concerns regarding medicines.  The following changes have been made:  See above.    Labs/ tests ordered today include:  No orders of the defined types were placed in this encounter.     Disposition:   FU with me in 3-4 months.     Signed, Nahser, Deloris Ping, MD  04/05/2014 9:11 AM    [facility] [address] [place], [place], [place]  [address] Phone: [phone]; Fax: [phone]

## 2014-04-05 NOTE — Patient Instructions (Addendum)
Your physician has recommended you make the following change in your medication:  INCREASE Aldactone to 25 mg once daily DECREASE Kdur (potassium) to 20 meq BID  Your physician recommends that you return for lab work in: 1 week for BMET  Your physician recommends that you schedule a follow-up appointment in: LIPID Clinic - soon (may coordinate with lab appointment if done next week)  Your physician wants you to follow-up in: 4 months with Dr. Elease Hashimoto.  You will receive a reminder letter in the mail two months in advance. If you don't receive a letter, please call our office to schedule the follow-up appointment.

## 2014-04-12 ENCOUNTER — Other Ambulatory Visit (INDEPENDENT_AMBULATORY_CARE_PROVIDER_SITE_OTHER): Payer: BC Managed Care – PPO | Admitting: *Deleted

## 2014-04-12 ENCOUNTER — Telehealth: Payer: Self-pay | Admitting: Pharmacist Clinician (PhC)/ Clinical Pharmacy Specialist

## 2014-04-12 ENCOUNTER — Encounter: Payer: BC Managed Care – PPO | Admitting: Pharmacist

## 2014-04-12 DIAGNOSIS — I480 Paroxysmal atrial fibrillation: Secondary | ICD-10-CM

## 2014-04-12 DIAGNOSIS — E785 Hyperlipidemia, unspecified: Secondary | ICD-10-CM

## 2014-04-12 DIAGNOSIS — R0600 Dyspnea, unspecified: Secondary | ICD-10-CM

## 2014-04-12 DIAGNOSIS — I5032 Chronic diastolic (congestive) heart failure: Secondary | ICD-10-CM

## 2014-04-12 LAB — BASIC METABOLIC PANEL
BUN: 15 mg/dL (ref 6–23)
CHLORIDE: 102 meq/L (ref 96–112)
CO2: 30 meq/L (ref 19–32)
Calcium: 9.3 mg/dL (ref 8.4–10.5)
Creatinine, Ser: 0.96 mg/dL (ref 0.40–1.20)
GFR: 62.2 mL/min (ref >60.00)
GLUCOSE: 105 mg/dL — AB (ref 70–99)
Potassium: 3.9 mEq/L (ref 3.5–5.1)
SODIUM: 137 meq/L (ref 135–145)

## 2014-04-12 NOTE — Telephone Encounter (Signed)
Pt had labs drawn on 3/22 and appt for 3/24 in the Lipid Clinic.  If labs acceptable, may be able to discuss on the phone rather than an office visit.

## 2014-04-12 NOTE — Patient Instructions (Signed)
Go to the lab some time in the next week (fasting) and get your cholesterol checked.

## 2014-04-12 NOTE — Telephone Encounter (Signed)
Cancelled pt appt with Lipid clinic - no labs since October.  Will have drawn today and call patient with results

## 2014-04-14 ENCOUNTER — Telehealth: Payer: Self-pay | Admitting: Pharmacist

## 2014-04-14 ENCOUNTER — Ambulatory Visit: Payer: BC Managed Care – PPO | Admitting: Pharmacist

## 2014-04-14 NOTE — Telephone Encounter (Signed)
Called patient to follow up on lab results.  Unfortunately due to workflow issues, her lipid profile was not drawn on 3/22 at her other lab appt.  When we reached out to her, she told us she had labs checked by Dr. Timothy Lasso in December.  Received copy of labs.  Lipid profile as follows:   TC- 223, TG 125, HDL 39, LDL 159  Spoke with pt.  Explained to her that her ASCVD 10-year risk is  5.9%.  The current guidelines suggest that you do not have to be on a statin medication at this time.  The cut off for starting a statin would be if your risk was >7.5%  or if your LDL is greater than 190.  At this time, it is your choice of whether or not you would like to start a statin.  Statins have shown the most benefit in preventing these events.  Given your history of intolerances, one option for you may be pravastatin at 5-10mg  per day.  It is associated with less muscle pains that the Lipitor.  Another option other than statins is Zetia 10mg  daily. This medication will lower your LDL but there is no clinical data to show it will prevent someone from having a heart attack or stroke in the future.  Pt wants to think about her option but would prefer to stay off medication at this time given she still has some joint stiffness.  I have emailed a copy of this information to her at her request and given her my direct contact information for any other concerns.

## 2014-05-17 NOTE — Progress Notes (Signed)
This encounter was created in error - please disregard.

## 2014-05-18 ENCOUNTER — Other Ambulatory Visit: Payer: Self-pay

## 2014-05-18 DIAGNOSIS — Z1231 Encounter for screening mammogram for malignant neoplasm of breast: Secondary | ICD-10-CM

## 2014-06-13 ENCOUNTER — Other Ambulatory Visit: Payer: Self-pay | Admitting: Cardiovascular Disease

## 2014-06-14 NOTE — Telephone Encounter (Signed)
Just wanted to clarify frequency of med. Office visit notes say the patient is to take 150mg  qd, but I was thinking that pradaxa is always q12 hrs which is what the pharmacy is requesting. Please advise. Thanks, MI

## 2014-06-15 NOTE — Telephone Encounter (Signed)
I do not know how documentation on patient's medication list was changed from Pradaxa BID to QD but patient's dose should be Pradaxa 150 mg BID.

## 2014-06-21 ENCOUNTER — Ambulatory Visit
Admission: RE | Admit: 2014-06-21 | Discharge: 2014-06-21 | Disposition: A | Discharge status: Home / Self Care | Payer: BC Managed Care – PPO | Source: Ambulatory Visit

## 2014-06-21 DIAGNOSIS — Z1231 Encounter for screening mammogram for malignant neoplasm of breast: Secondary | ICD-10-CM

## 2014-07-02 ENCOUNTER — Other Ambulatory Visit: Payer: Self-pay | Admitting: Cardiovascular Disease

## 2014-09-01 ENCOUNTER — Other Ambulatory Visit: Payer: Self-pay | Admitting: Cardiovascular Disease

## 2014-10-05 ENCOUNTER — Telehealth: Payer: Self-pay | Admitting: Cardiovascular Disease

## 2014-10-05 NOTE — Telephone Encounter (Signed)
New message      Request for surgical clearance:  1. What type of surgery is being performed? 2 implants and a sinus lift  2. When is this surgery scheduled?11-02-14  3. Are there any medications that need to be held prior to surgery and how long?prodaxa  4. Name of physician performing surgery? Dr Wendall Mola  5. What is your office phone and fax number?  6. Pt insisted nurse call her with this information.  I told her to have Dr Elinor Dodge office call but she said "she wanted to know---you mean you cannot tell me".  I just took the message.  Pt said it was ok to leave msg on vm.

## 2014-10-05 NOTE — Telephone Encounter (Signed)
Pt is at low risk for Sinus surgery . OK to hold Pradaxa for 2 days prior to surgery  Restart the day after surgery if ok with ENT

## 2014-10-06 NOTE — Telephone Encounter (Signed)
Note placed in HIM to be faxed to [phone], Dr. Wendall Mola

## 2014-10-06 NOTE — Telephone Encounter (Signed)
Spoke with patient and reviewed Dr. Harvie Bridge advice.  I verified oral surgeon, Dr. Wendall Mola and advised that I will send information to his office.  She verbalized understanding and agreement.

## 2014-10-21 ENCOUNTER — Other Ambulatory Visit: Payer: Self-pay

## 2014-10-21 ENCOUNTER — Ambulatory Visit (INDEPENDENT_AMBULATORY_CARE_PROVIDER_SITE_OTHER): Payer: BC Managed Care – PPO | Admitting: Cardiovascular Disease

## 2014-10-21 ENCOUNTER — Encounter: Payer: Self-pay | Admitting: Cardiovascular Disease

## 2014-10-21 VITALS — BP 118/86 | HR 64 | Ht 69.0 in | Wt 230.1 lb

## 2014-10-21 DIAGNOSIS — I480 Paroxysmal atrial fibrillation: Secondary | ICD-10-CM | POA: Diagnosis not present

## 2014-10-21 LAB — MAGNESIUM: Magnesium: 2.2 mg/dL (ref 1.5–2.5)

## 2014-10-21 LAB — BASIC METABOLIC PANEL
BUN: 18 mg/dL (ref 6–23)
CALCIUM: 9.7 mg/dL (ref 8.4–10.5)
CO2: 27 mEq/L (ref 19–32)
Chloride: 102 mEq/L (ref 96–112)
Creatinine, Ser: 0.99 mg/dL (ref 0.40–1.20)
GFR: 59.93 mL/min — AB (ref >60.00)
GLUCOSE: 112 mg/dL — AB (ref 70–99)
Potassium: 3.9 mEq/L (ref 3.5–5.1)
Sodium: 139 mEq/L (ref 135–145)

## 2014-10-21 NOTE — Patient Instructions (Signed)
Medication Instructions:  Your physician recommends that you continue on your current medications as directed. Please refer to the Current Medication list given to you today.   Labwork: TODAY - basic metabolic panel, magnesium level  Your physician recommends that you return for lab work in: 6 months on the same day as your appointment with Dr. Elease Hashimoto   Testing/Procedures: None Ordered   Follow-Up: Your physician wants you to follow-up in: 6 months with Dr. Elease Hashimoto.  You will receive a reminder letter in the mail two months in advance. If you don't receive a letter, please call our office to schedule the follow-up appointment.

## 2014-10-21 NOTE — Progress Notes (Signed)
Cardiology Office Note   Date:  10/21/2014   ID:  [name], DOB Oct 30, 1950, MRN [id number]  PCP:  Gwen Pounds, MD  Cardiologist:   Vesta Mixer, MD   Chief Complaint  Patient presents with  . Atrial Fibrillation   1. Atrial fibrillation 2. Hyperlipidemia 3. Diastolic congestive heart failure   History of Present Illness:  64 year old female with a history of atrial fibrillation. She's been well controlled on Tikosyn. She also has a history of hyperlipidemia and is on Lipitor. She's been on chronic Pradaxa therapy for anticoagulation.  February 20, 2012 [name] has done well since I last saw her. 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 She has occasional palpitations but overall she's doing quite well. She is exercising ( water aerobics) twice a week.  Feb. 24, 2015:  [name] is doing well. No problems. Maintaining NSR.  Sept. 16, 2015:  She was in the ER this past week. CP, dyspnea .. CT angio was negative for PE - showed ? Of pneumonia. Was treated wit Abx. Does well in the am's .[name] and perhaps more short of breath in the afternoons.   April 05, 2014: [name] is a 64 y.o. female who presents for follow-up of her atrial fibrillation. She has continued to have some DOE,  Is concerned about the atorvastatin. Was seen in the lipid clinic.    Sept. 30, 2016:  Doing well.  No CP , no dyspnea. Has lost 11 lbs since last year.  Is in NSR   Past Medical History  Diagnosis Date  . Paroxysmal atrial fibrillation   . Migraine headache   . Dyspnea   . Restrictive cardiomyopathy   . Diastolic congestive heart failure   . Hyperlipemia   . Chronic anticoagulation   . Tubal pregnancy   . Back pain     nerve problems-had series of three injections    Past Surgical History  Procedure Laterality Date  . Cardiac catheterization    . Cardioversion    . Rotator cuff repair      bilateral  . Ectopic  pregnancy surgery  1980  . Cholecystectomy    . Breast lumpectomy      benign  . Eye surgery      left eye injections x2-was having "stroke like" flashes of light  . Hysteroscopy  6/05    and D&C-simple hyperplasia secondary PMP bleeding     Current Outpatient Prescriptions  Medication Sig Dispense Refill  . aspirin 81 MG tablet Take 81 mg by mouth daily.      . dabigatran (PRADAXA) 150 MG CAPS capsule Take 1 capsule (150 mg total) by mouth 2 (two) times daily. 180 capsule 3  . dofetilide (TIKOSYN) 125 MCG capsule Take 375 mcg by mouth 2 (two) times daily.    . furosemide (LASIX) 40 MG tablet Take 40 mg by mouth 2 (two) times daily.    [name] gabapentin (NEURONTIN) 600 MG tablet Take 900 mg by mouth at bedtime.     . lidocaine (XYLOCAINE) 2 % solution Use as directed 20 mLs in the mouth or throat daily as needed for mouth pain. Headache    . Multiple Vitamins-Minerals (VISION FORMULA PO) Take 1 tablet by mouth daily.     . potassium chloride SA (K-DUR,KLOR-CON) 20 MEQ tablet Take 1 tablet (20 mEq total) by mouth 2 (two) times daily. 180 tablet 1  . primidone (MYSOLINE) 50 MG tablet Take 25 mg by mouth daily.    [name]  spironolactone (ALDACTONE) 25 MG tablet Take 1 tablet (25 mg total) by mouth daily. 90 tablet 3   No current facility-administered medications for this visit.    Allergies:   Codeine; Oxycodone; and Penicillins    Social History:  The patient  reports that she has never smoked. She has never used smokeless tobacco. She reports that she drinks alcohol. She reports that she does not use illicit drugs.   Family History:  [name] family history includes Atrial fibrillation in her mother; Breast cancer (age of onset: 36) in her sister; Cancer in her maternal grandmother and maternal uncle; Heart disease in her sister; Hypertension in her father; Thyroid cancer in her sister.    ROS:  Please see the history of present illness.    Review of Systems: Constitutional:  denies  fever, chills, diaphoresis, appetite change and fatigue.  HEENT: denies photophobia, eye pain, redness, hearing loss, ear pain, congestion, sore throat, rhinorrhea, sneezing, neck pain, neck stiffness and tinnitus.  Respiratory: admits to SOB, DOE, cough, chest tightness, and wheezing.  Cardiovascular: denies chest pain, palpitations and leg swelling.  Gastrointestinal: denies nausea, vomiting, abdominal pain, diarrhea, constipation, blood in stool.  Genitourinary: denies dysuria, urgency, frequency, hematuria, flank pain and difficulty urinating.  Musculoskeletal: denies  myalgias, back pain, joint swelling, arthralgias and gait problem.   Skin: denies pallor, rash and wound.  Neurological: denies dizziness, seizures, syncope, weakness, light-headedness, numbness and headaches.   Hematological: denies adenopathy, easy bruising, personal or family bleeding history.  Psychiatric/ Behavioral: denies suicidal ideation, mood changes, confusion, nervousness, sleep disturbance and agitation.       All other systems are reviewed and negative.    PHYSICAL EXAM: VS:  BP 118/86 mmHg  Pulse 64  Ht  (1.753 m)  Wt 104.382 kg (230 lb 1.9 oz)  BMI 33.97 kg/m2  LMP 01/21/1990 , BMI Body mass index is 33.97 kg/(m^2). GEN: Well nourished, well developed, in no acute distress HEENT: normal Neck: no JVD, carotid bruits, or masses Cardiac: RRR; no murmurs, rubs, or gallops,no edema  Respiratory:  clear to auscultation bilaterally, normal work of breathing GI: soft, nontender, nondistended, + BS MS: no deformity or atrophy Skin: warm and dry, no rash Neuro:  Strength and sensation are intact Psych: normal   EKG:  EKG is ordered today. The ekg ordered today demonstrates NSR at 66, no ST or T wave abn.   Qtc = 427 ms .    Recent Labs: 04/12/2014: BUN 15; Creatinine, Ser 0.96; Potassium 3.9; Sodium 137    Lipid Panel    Component Value Date/Time   CHOL 161 10/22/2013 1501   TRIG 163.0*  10/22/2013 1501   HDL 41.60 10/22/2013 1501   CHOLHDL 4 10/22/2013 1501   VLDL 32.6 10/22/2013 1501   LDLCALC 87 10/22/2013 1501      Wt Readings from Last 3 Encounters:  10/21/14 104.382 kg (230 lb 1.9 oz)  04/05/14 109.371 kg (241 lb 1.9 oz)  11/04/13 109.77 kg (242 lb)    ECG was ordered today  Sept. 30.  NSR at 64. Normal   Other studies Reviewed: Additional studies/ records that were reviewed today include: . Review of the above records demonstrates:    ASSESSMENT AND PLAN:  1. Atrial fibrillation - she is currently maintaining normal sinus rhythm. She's currently on Tikosyn and Pradaxa. Will check BMP and Mag today .  QTC is 427 ms.  Will see her 6 months for OV, ECG , BMP and mag level.  2. Hyperlipidemia- managed by her primary MD   3. Diastolic congestive heart failure - she has done well with the   If she continues to have shortness of breath with exertion, we will consider referring her to the heart failure clinic. She is already seen the pulmonary doctors.   Current medicines are reviewed at length with the patient today.  The patient does not have concerns regarding medicines.  The following changes have been made:  See above.    Labs/ tests ordered today include:  No orders of the defined types were placed in this encounter.     Disposition:   FU with me in 6 months.     Signed, Nahser, Deloris Ping, MD  10/21/2014 8:13 AM    [facility] [address] [place], [place], [place]  [address] Phone: [phone]; Fax: [phone]

## 2014-12-30 ENCOUNTER — Encounter: Payer: Self-pay | Admitting: Obstetrics & Gynecology

## 2014-12-30 ENCOUNTER — Ambulatory Visit (INDEPENDENT_AMBULATORY_CARE_PROVIDER_SITE_OTHER): Payer: BC Managed Care – PPO | Admitting: Obstetrics & Gynecology

## 2014-12-30 VITALS — BP 122/72 | HR 66 | Resp 14 | Ht 69.0 in | Wt 233.0 lb

## 2014-12-30 DIAGNOSIS — Z01419 Encounter for gynecological examination (general) (routine) without abnormal findings: Secondary | ICD-10-CM | POA: Diagnosis not present

## 2014-12-30 DIAGNOSIS — Z124 Encounter for screening for malignant neoplasm of cervix: Secondary | ICD-10-CM | POA: Diagnosis not present

## 2014-12-30 NOTE — Patient Instructions (Signed)
Check with Dr. Timothy Lasso about Hep C testing.

## 2014-12-30 NOTE — Progress Notes (Signed)
64 y.o. G1P0 MarriedCaucasianF here for annual exam.  Doing well.  Seeing Dr. Melburn Popper every six months.  Insurance isn't going to cover her Pradaxa.  Going to be on Medicare at 65.    Denies vaginal bleeding.    PCP:  Dr. Timothy Lasso.  Saw him about six months ago.    Patient's last menstrual period was 01/21/1990.          Sexually active: Yes.    The current method of family planning is post menopausal status.    Exercising: Yes.    Water Aerobics  Smoker:  no  Health Maintenance: Pap:  11/04/13 Neg, neg pap with neg HR HPV 8/13 History of abnormal Pap:  no MMG:  06/21/14 BIRADS1:neg Colonoscopy:  2008 Normal repeat 10 years  BMD:   2011 with Dr. Timothy Lasso TDaP: 2016  Screening Labs: PCP, Hb today: PCP, Urine today: PCP   reports that she has never smoked. She has never used smokeless tobacco. She reports that she drinks alcohol. She reports that she does not use illicit drugs.  Past Medical History  Diagnosis Date  . Paroxysmal atrial fibrillation (HCC)   . Migraine headache   . Dyspnea   . Restrictive cardiomyopathy (HCC)   . Diastolic congestive heart failure (HCC)   . Hyperlipemia   . Chronic anticoagulation   . Tubal pregnancy   . Back pain     nerve problems-had series of three injections    Past Surgical History  Procedure Laterality Date  . Cardiac catheterization    . Cardioversion    . Rotator cuff repair      bilateral  . Ectopic pregnancy surgery  1980  . Cholecystectomy    . Breast lumpectomy      benign  . Eye surgery      left eye injections x2-was having "stroke like" flashes of light  . Hysteroscopy  6/05    and D&C-simple hyperplasia secondary PMP bleeding    Current Outpatient Prescriptions  Medication Sig Dispense Refill  . aspirin 81 MG tablet Take 81 mg by mouth daily.      . dabigatran (PRADAXA) 150 MG CAPS capsule Take 1 capsule (150 mg total) by mouth 2 (two) times daily. 180 capsule 3  . dofetilide (TIKOSYN) 125 MCG capsule Take 375 mcg by  mouth 2 (two) times daily.    . furosemide (LASIX) 40 MG tablet Take 40 mg by mouth 2 (two) times daily.    [name] gabapentin (NEURONTIN) 600 MG tablet Take 900 mg by mouth at bedtime.     . lidocaine (XYLOCAINE) 2 % solution Use as directed 20 mLs in the mouth or throat daily as needed for mouth pain. Headache    . Multiple Vitamins-Minerals (VISION FORMULA PO) Take 1 tablet by mouth daily.     . potassium chloride SA (K-DUR,KLOR-CON) 20 MEQ tablet Take 1 tablet (20 mEq total) by mouth 2 (two) times daily. 180 tablet 1  . spironolactone (ALDACTONE) 25 MG tablet Take 1 tablet (25 mg total) by mouth daily. 90 tablet 3   No current facility-administered medications for this visit.    Family History  Problem Relation Age of Onset  . Hypertension Father   . Atrial fibrillation Mother   . Cancer Maternal Grandmother     mouth  . Cancer Maternal Uncle     unknown type  . Thyroid cancer Sister   . Breast cancer Sister 1  . Heart disease Sister     ? diag  ROS:  Pertinent items are noted in HPI.  Otherwise, a comprehensive ROS was negative.  Exam:   BP 122/72 mmHg  Pulse 66  Resp 14  Ht  (1.753 m)  Wt 233 lb (105.688 kg)  BMI 34.39 kg/m2  LMP 01/21/1990  Weight change:  -9#  Height:  (175.3 cm)  Ht Readings from Last 3 Encounters:  12/30/14  (1.753 m)  10/21/14  (1.753 m)  04/05/14  (1.753 m)   General appearance: alert, cooperative and appears stated age Head: Normocephalic, without obvious abnormality, atraumatic Neck: no adenopathy, supple, symmetrical, trachea midline and thyroid normal to inspection and palpation Lungs: clear to auscultation bilaterally Breasts: normal appearance, no masses or tenderness Heart: regular rate and rhythm Abdomen: soft, non-tender; bowel sounds normal; no masses,  no organomegaly, about 8cm umbilical hernia, further enlargement from last year Extremities: extremities normal, atraumatic, no cyanosis or edema Skin: Skin  color, texture, turgor normal. No rashes or lesions Lymph nodes: Cervical, supraclavicular, and axillary nodes normal. No abnormal inguinal nodes palpated Neurologic: Grossly normal  Pelvic: External genitalia:  no lesions              Urethra:  normal appearing urethra with no masses, tenderness or lesions              Bartholins and Skenes: normal                 Vagina: normal appearing vagina with normal color and discharge, no lesions              Cervix: no lesions              Pap taken: Yes.   Bimanual Exam:  Uterus:  normal size, contour, position, consistency, mobility, non-tender              Adnexa: normal adnexa and no mass, fullness, tenderness               Rectovaginal: Confirms               Anus:  normal sphincter tone, no lesions  Chaperone was present for exam.  A:  Well Woman with normal exam  PMP, no HRT  H/O paroxysmal atrial fibrillation, on baby ASA and Pradaxa  H/O heart failure due to unknown etiology.  Followed by Dr. Melburn Popper every six months Enlarging abdominal hernia.  Saw general surgery last year.  Advised to watch.  P: Mammogram yearly. Doing 3D due Grade C breast density pap smear with neg HR HPV 8/13. Pap 2015.  Pap and HR HPV today as pt will transition to Medicare this next year. Labs and vaccines with Dr. Timothy Lasso and Dr. Melburn Popper. Seen every six months.   Hep C testing discussed.  Pt will ask Dr. Timothy Lasso next year.   return annually or prn

## 2015-01-03 LAB — IPS PAP TEST WITH HPV

## 2015-01-04 ENCOUNTER — Telehealth: Payer: Self-pay | Admitting: Cardiovascular Disease

## 2015-01-04 NOTE — Telephone Encounter (Signed)
  She will need to check with her insurance to see if the others are covered  Check the price of the following anticoagulants:    Xarelto 20 mg a day Eliquis 5 mg twice a day Savaysa 60 mg a day.  Or we can consider coumadin

## 2015-01-04 NOTE — Telephone Encounter (Signed)
New message  Pt c/o medication issue:  1. Name of Medication: Pradaxa     4. What is your medication issue? Current insurance does not pay for pradaxa will need an alternative for about 4-5 months until she switches to her new insurance. Please call back to discuss.

## 2015-01-04 NOTE — Telephone Encounter (Signed)
Routing to Dr. Elease Hashimoto for advice on alternative medication

## 2015-01-04 NOTE — Telephone Encounter (Signed)
Left message for patient to call back to discuss alternative medications

## 2015-01-10 NOTE — Telephone Encounter (Signed)
Left message for patient to call back  

## 2015-01-11 NOTE — Telephone Encounter (Signed)
F/u  Pt returning RN phone call- can use [phone]. Please call back and discuss.

## 2015-01-11 NOTE — Telephone Encounter (Signed)
Spoke with pt and gave her alternatives.  She will check with her insurance and let us know which is covered.

## 2015-01-20 ENCOUNTER — Telehealth: Payer: Self-pay | Admitting: Cardiovascular Disease

## 2015-01-20 NOTE — Telephone Encounter (Signed)
NewMessage  Pt just wanted to inform Dr Elease Hashimoto that pt wanted to continue w/ xarelto.

## 2015-01-20 NOTE — Telephone Encounter (Signed)
Routing to PharmD for advisement on switching from Pradaxa to Xarelto

## 2015-01-24 NOTE — Telephone Encounter (Signed)
Returned patient's call - her new insurance will cover Xarelto not Pradaxa. However, she just received a final 3 month supply of Pradaxa from her insurance company. She will also be starting Medicare in April of this year so her insurance will be changing again. Explained that Pradaxa and Xarelto are similar but that Xarelto is taken daily and is associated with better GI tolerability. She will call us when she runs low of her Pradaxa in 3 months and states that she would like to switch to Xarelto at that time.

## 2015-01-30 ENCOUNTER — Telehealth: Payer: Self-pay | Admitting: Cardiovascular Disease

## 2015-01-30 NOTE — Telephone Encounter (Signed)
Returned patient's call. She wanted to let us know that her new insurance for 2017 prefers [facility] mail order rather than [facility]. Her pharmacy info has been updated.

## 2015-01-30 NOTE — Telephone Encounter (Signed)
New Message  Pt had additional questions concerning how to proceed w/  rx- Please call back and discuss.

## 2015-03-13 ENCOUNTER — Other Ambulatory Visit: Payer: Self-pay | Admitting: *Deleted

## 2015-03-13 MED ORDER — FUROSEMIDE 40 MG PO TABS: 40.0000 mg | ORAL_TABLET | Freq: Two times a day (BID) | ORAL | Status: DC

## 2015-04-04 ENCOUNTER — Other Ambulatory Visit: Payer: Self-pay | Admitting: *Deleted

## 2015-04-04 MED ORDER — POTASSIUM CHLORIDE CRYS ER 20 MEQ PO TBCR: 20.0000 meq | EXTENDED_RELEASE_TABLET | Freq: Two times a day (BID) | ORAL | Status: DC

## 2015-04-20 ENCOUNTER — Ambulatory Visit (INDEPENDENT_AMBULATORY_CARE_PROVIDER_SITE_OTHER): Payer: BC Managed Care – PPO | Admitting: Cardiovascular Disease

## 2015-04-20 ENCOUNTER — Other Ambulatory Visit (INDEPENDENT_AMBULATORY_CARE_PROVIDER_SITE_OTHER): Payer: BC Managed Care – PPO | Admitting: *Deleted

## 2015-04-20 ENCOUNTER — Encounter: Payer: Self-pay | Admitting: Cardiovascular Disease

## 2015-04-20 VITALS — BP 116/76 | HR 60 | Ht 69.0 in | Wt 229.8 lb

## 2015-04-20 DIAGNOSIS — E785 Hyperlipidemia, unspecified: Secondary | ICD-10-CM | POA: Diagnosis not present

## 2015-04-20 DIAGNOSIS — I429 Cardiomyopathy, unspecified: Secondary | ICD-10-CM | POA: Diagnosis not present

## 2015-04-20 DIAGNOSIS — R0602 Shortness of breath: Secondary | ICD-10-CM | POA: Diagnosis not present

## 2015-04-20 DIAGNOSIS — I480 Paroxysmal atrial fibrillation: Secondary | ICD-10-CM | POA: Diagnosis not present

## 2015-04-20 DIAGNOSIS — I5032 Chronic diastolic (congestive) heart failure: Secondary | ICD-10-CM

## 2015-04-20 LAB — BASIC METABOLIC PANEL
BUN: 14 mg/dL (ref 7–25)
CALCIUM: 9.2 mg/dL (ref 8.6–10.4)
CHLORIDE: 103 mmol/L (ref 98–110)
CO2: 30 mmol/L (ref 20–31)
CREATININE: 0.95 mg/dL (ref 0.50–0.99)
Glucose, Bld: 86 mg/dL (ref 65–99)
Potassium: 4.2 mmol/L (ref 3.5–5.3)
Sodium: 140 mmol/L (ref 135–146)

## 2015-04-20 LAB — MAGNESIUM: Magnesium: 2.4 mg/dL (ref 1.5–2.5)

## 2015-04-20 NOTE — Progress Notes (Signed)
Cardiology Office Note   Date:  04/20/2015   ID:  [name], DOB [birth year], MRN [id number]  PCP:  Gwen Pounds, MD  Cardiologist:   Vesta Mixer, MD   Chief Complaint  Patient presents with  . Follow-up   1. Atrial fibrillation 2. Hyperlipidemia 3. Diastolic congestive heart failure   History of Present Illness:  65 year old female with a history of atrial fibrillation. She's been well controlled on Tikosyn. She also has a history of hyperlipidemia and is on Lipitor. She's been on chronic Pradaxa therapy for anticoagulation.  February 20, 2012 [name] has done well since I last saw her. 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 She has occasional palpitations but overall she's doing quite well. She is exercising ( water aerobics) twice a week.  Feb. 24, 2015:  [name] is doing well. No problems. Maintaining NSR.  Sept. 16, 2015:  She was in the ER this past week. CP, dyspnea .. CT angio was negative for PE - showed ? Of pneumonia. Was treated wit Abx. Does well in the am's .[name] and perhaps more short of breath in the afternoons.   April 05, 2014: [name] is a 65 y.o. female who presents for follow-up of her atrial fibrillation. She has continued to have some DOE,  Is concerned about the atorvastatin. Was seen in the lipid clinic.    Sept. 30, 2016:  Doing well.  No CP , no dyspnea. Has lost 11 lbs since last year.  Is in NSR  April 10, 2015:  [name] is doing ok from a cardiology standpoint. Has had some dental issues     Past Medical History  Diagnosis Date  . Paroxysmal atrial fibrillation (HCC)   . Migraine headache   . Dyspnea   . Restrictive cardiomyopathy (HCC)   . Diastolic congestive heart failure (HCC)   . Hyperlipemia   . Chronic anticoagulation   . Tubal pregnancy   . Back pain     nerve problems-had series of three injections    Past Surgical History  Procedure Laterality Date   . Cardiac catheterization    . Cardioversion    . Rotator cuff repair      bilateral  . Ectopic pregnancy surgery  1980  . Cholecystectomy    . Breast lumpectomy      benign  . Eye surgery      left eye injections x2-was having "stroke like" flashes of light  . Hysteroscopy  6/05    and D&C-simple hyperplasia secondary PMP bleeding     Current Outpatient Prescriptions  Medication Sig Dispense Refill  . aspirin 81 MG tablet Take 81 mg by mouth daily.      . dabigatran (PRADAXA) 150 MG CAPS capsule Take 1 capsule (150 mg total) by mouth 2 (two) times daily. 180 capsule 3  . dofetilide (TIKOSYN) 125 MCG capsule Take 375 mcg by mouth 2 (two) times daily.    . furosemide (LASIX) 40 MG tablet Take 1 tablet (40 mg total) by mouth 2 (two) times daily. 90 tablet 0  . gabapentin (NEURONTIN) 600 MG tablet Take 900 mg by mouth at bedtime.     . lidocaine (XYLOCAINE) 2 % solution Use as directed 20 mLs in the mouth or throat daily as needed for mouth pain. Headache    . Multiple Vitamins-Minerals (VISION FORMULA PO) Take 1 tablet by mouth daily.     . potassium chloride SA (K-DUR,KLOR-CON) 20 MEQ tablet Take 1  tablet (20 mEq total) by mouth 2 (two) times daily. 180 tablet 1  . spironolactone (ALDACTONE) 25 MG tablet Take 1 tablet (25 mg total) by mouth daily. 90 tablet 3   No current facility-administered medications for this visit.    Allergies:   Topamax; Codeine; Oxycodone; and Penicillins    Social History:  The patient  reports that she has never smoked. She has never used smokeless tobacco. She reports that she drinks alcohol. She reports that she does not use illicit drugs.   Family History:  [name] family history includes Atrial fibrillation in her mother; Breast cancer (age of onset: 30) in her sister; Cancer in her maternal grandmother and maternal uncle; Heart disease in her sister; Hypertension in her father; Thyroid cancer in her sister.    ROS:  Please see the history  of present illness.    Review of Systems: Constitutional:  denies fever, chills, diaphoresis, appetite change and fatigue.  HEENT: denies photophobia, eye pain, redness, hearing loss, ear pain, congestion, sore throat, rhinorrhea, sneezing, neck pain, neck stiffness and tinnitus.  Respiratory: admits to SOB, DOE, cough, chest tightness, and wheezing.  Cardiovascular: denies chest pain, palpitations and leg swelling.  Gastrointestinal: denies nausea, vomiting, abdominal pain, diarrhea, constipation, blood in stool.  Genitourinary: denies dysuria, urgency, frequency, hematuria, flank pain and difficulty urinating.  Musculoskeletal: denies  myalgias, back pain, joint swelling, arthralgias and gait problem.   Skin: denies pallor, rash and wound.  Neurological: denies dizziness, seizures, syncope, weakness, light-headedness, numbness and headaches.   Hematological: denies adenopathy, easy bruising, personal or family bleeding history.  Psychiatric/ Behavioral: denies suicidal ideation, mood changes, confusion, nervousness, sleep disturbance and agitation.       All other systems are reviewed and negative.    PHYSICAL EXAM: VS:  BP 116/76 mmHg  Pulse 60  Ht 5\' 9"  (1.753 m)  Wt 229 lb 12.8 oz (104.237 kg)  BMI 33.92 kg/m2  LMP 01/21/1990 , BMI Body mass index is 33.92 kg/(m^2). GEN: Well nourished, well developed, in no acute distress HEENT: normal Neck: no JVD, carotid bruits, or masses Cardiac: RRR; no murmurs, rubs, or gallops,no edema  Respiratory:  clear to auscultation bilaterally, normal work of breathing GI: soft, nontender, nondistended, + BS MS: no deformity or atrophy Skin: warm and dry, no rash Neuro:  Strength and sensation are intact Psych: normal   EKG:  EKG is ordered today. The ekg ordered today demonstrates NSR at 60, no ST or T wave abn.   Qtc = 434 ms .    Recent Labs: 10/21/2014: BUN 18; Creatinine, Ser 0.99; Magnesium 2.2; Potassium 3.9; Sodium 139     Lipid Panel    Component Value Date/Time   CHOL 161 10/22/2013 1501   TRIG 163.0* 10/22/2013 1501   HDL 41.60 10/22/2013 1501   CHOLHDL 4 10/22/2013 1501   VLDL 32.6 10/22/2013 1501   LDLCALC 87 10/22/2013 1501      Wt Readings from Last 3 Encounters:  04/20/15 229 lb 12.8 oz (104.237 kg)  12/30/14 233 lb (105.688 kg)  10/21/14 230 lb 1.9 oz (104.382 kg)    ECG was ordered today  Sept. 30.  NSR at 64. Normal   Other studies Reviewed: Additional studies/ records that were reviewed today include: . Review of the above records demonstrates:    ASSESSMENT AND PLAN:  1. Atrial fibrillation - she is currently maintaining normal sinus rhythm. She's currently on Tikosyn and Pradaxa.  She will need to change the Pradaxa to  Eliquis or Xarelto soon - she will let us know which she needs.  Will check BMP and Mag today .  QTC is 434 ms.  Will see her 6 months for OV, ECG , BMP and mag level.  2. Hyperlipidemia- managed by her primary MD   3. Diastolic congestive heart failure - she has done well with the lasix and aldactone.  K has been 3.9.  Will check today       Current medicines are reviewed at length with the patient today.  The patient does not have concerns regarding medicines.  The following changes have been made:  See above.    Labs/ tests ordered today include:  No orders of the defined types were placed in this encounter.     Disposition:   FU with me in 6 months.     Signed, Nahser, Deloris Ping, MD  04/20/2015 8:22 AM    [facility] [address] [place], [place], [place]  [address] Phone: [phone]; Fax: [phone]

## 2015-04-20 NOTE — Addendum Note (Signed)
Addended by: Tonita Phoenix on: 04/20/2015 08:33 AM   Modules accepted: Orders

## 2015-04-20 NOTE — Patient Instructions (Addendum)
Medication Instructions:  Your physician recommends that you continue on your current medications as directed. Please refer to the Current Medication list given to you today.   Labwork: TODAY - magnesium level, basic metabolic panel  Your physician recommends that you return for lab work in: 6 months on the same day as your office visit (magnesium, basic metabolic panel)   Testing/Procedures: None Ordered   Follow-Up: Your physician wants you to follow-up in: 6 months with Dr. Elease Hashimoto.  You will receive a reminder letter in the mail two months in advance. If you don't receive a letter, please call our office to schedule the follow-up appointment.   If you need a refill on your cardiac medications before your next appointment, please call your pharmacy.   Thank you for choosing [facility]! Eligha Bridegroom, RN [phone]

## 2015-04-20 NOTE — Addendum Note (Signed)
Addended by: Tonita Phoenix on: 04/20/2015 08:07 AM   Modules accepted: Orders

## 2015-04-25 ENCOUNTER — Other Ambulatory Visit: Payer: Self-pay | Admitting: Nurse Practitioner

## 2015-04-25 MED ORDER — APIXABAN 5 MG PO TABS
5.0000 mg | ORAL_TABLET | Freq: Two times a day (BID) | ORAL | Status: DC
Start: 2015-04-25 — End: 2015-05-08

## 2015-04-28 ENCOUNTER — Other Ambulatory Visit: Payer: Self-pay | Admitting: Cardiovascular Disease

## 2015-04-28 ENCOUNTER — Telehealth: Payer: Self-pay | Admitting: *Deleted

## 2015-04-28 MED ORDER — FUROSEMIDE 40 MG PO TABS: 40.0000 mg | ORAL_TABLET | Freq: Two times a day (BID) | ORAL | Status: DC

## 2015-04-28 NOTE — Telephone Encounter (Signed)
Ok to change to Xarelto 20 mg a day . To be taken with the largest meal of the day  - for most people this would be dinner. She may take Pradaxa in the AM and then start Xarelto that night with dinner.

## 2015-04-28 NOTE — Telephone Encounter (Signed)
Patient called and stated that she had discussed with you at her recent office visit about switching from pradaxa to either eliquis or xarelto. She would like to switch to xarelto. Please advise. Thanks, MI

## 2015-04-28 NOTE — Telephone Encounter (Signed)
Left message to call back  

## 2015-05-02 ENCOUNTER — Encounter: Payer: Self-pay | Admitting: Obstetrics & Gynecology

## 2015-05-02 NOTE — Telephone Encounter (Signed)
Updated patient chart. Message sent to patient. Routing to Dr. Hyacinth Meeker. Will close.

## 2015-05-08 MED ORDER — RIVAROXABAN 20 MG PO TABS: 20.0000 mg | ORAL_TABLET | Freq: Every day | ORAL | Status: DC

## 2015-05-08 NOTE — Telephone Encounter (Signed)
Left detailed message on patient's voice mail regarding medication change and that Rx has been sent to [facility].  I advised her to also d/c aspirin.  I advised her to call back with questions or concerns.

## 2015-05-10 ENCOUNTER — Telehealth: Payer: Self-pay

## 2015-05-10 ENCOUNTER — Other Ambulatory Visit: Payer: Self-pay | Admitting: *Deleted

## 2015-05-10 MED ORDER — FUROSEMIDE 40 MG PO TABS: 40.0000 mg | ORAL_TABLET | Freq: Two times a day (BID) | ORAL | Status: DC

## 2015-05-10 NOTE — Telephone Encounter (Signed)
Prior auth for Xarelto 20mg submitted to [facility]. 

## 2015-05-10 NOTE — Telephone Encounter (Signed)
Xarelto approved through 01/21/2016. [id number].

## 2015-05-12 ENCOUNTER — Other Ambulatory Visit: Payer: Self-pay | Admitting: *Deleted

## 2015-05-12 MED ORDER — SPIRONOLACTONE 25 MG PO TABS
25.0000 mg | ORAL_TABLET | Freq: Every day | ORAL | Status: DC
Start: 2015-05-12 — End: 2016-03-01

## 2015-05-15 ENCOUNTER — Other Ambulatory Visit: Payer: Self-pay

## 2015-05-15 DIAGNOSIS — Z1231 Encounter for screening mammogram for malignant neoplasm of breast: Secondary | ICD-10-CM

## 2015-06-22 ENCOUNTER — Ambulatory Visit
Admission: RE | Admit: 2015-06-22 | Discharge: 2015-06-22 | Disposition: A | Discharge status: Home / Self Care | Payer: Medicare Other | Source: Ambulatory Visit

## 2015-06-22 DIAGNOSIS — Z1231 Encounter for screening mammogram for malignant neoplasm of breast: Secondary | ICD-10-CM

## 2015-07-07 ENCOUNTER — Other Ambulatory Visit: Payer: Self-pay | Admitting: *Deleted

## 2015-07-07 MED ORDER — DOFETILIDE 125 MCG PO CAPS: 375.0000 ug | ORAL_CAPSULE | Freq: Two times a day (BID) | ORAL | Status: DC

## 2015-07-12 ENCOUNTER — Other Ambulatory Visit: Payer: Self-pay

## 2015-07-12 MED ORDER — POTASSIUM CHLORIDE CRYS ER 20 MEQ PO TBCR: 20.0000 meq | EXTENDED_RELEASE_TABLET | Freq: Two times a day (BID) | ORAL | Status: DC

## 2015-07-12 NOTE — Telephone Encounter (Signed)
Needs temporary supply while waiting on mail order delivery.   Vesta Mixer, MD at 04/20/2015 8:22 AM  potassium chloride SA (K-DUR,KLOR-CON) 20 MEQ tabletTake 1 tablet (20 mEq total) by mouth 2 (two) times daily Current medicines are reviewed at length with the patient today. The patient does not have concerns regarding medicines.  The following changes have been made: See above.   Patient Instructions     Medication Instructions:  Your physician recommends that you continue on your current medications as directed. Please refer to the Current Medication list given to you today.

## 2015-07-13 ENCOUNTER — Other Ambulatory Visit: Payer: Self-pay | Admitting: Cardiovascular Disease

## 2015-07-13 ENCOUNTER — Other Ambulatory Visit: Payer: Self-pay | Admitting: *Deleted

## 2015-07-13 MED ORDER — POTASSIUM CHLORIDE CRYS ER 20 MEQ PO TBCR: 20.0000 meq | EXTENDED_RELEASE_TABLET | Freq: Two times a day (BID) | ORAL | Status: DC

## 2015-07-13 MED ORDER — DOFETILIDE 125 MCG PO CAPS: 375.0000 ug | ORAL_CAPSULE | Freq: Two times a day (BID) | ORAL | Status: DC

## 2015-07-14 ENCOUNTER — Other Ambulatory Visit: Payer: Self-pay | Admitting: Cardiovascular Disease

## 2015-07-14 MED ORDER — POTASSIUM CHLORIDE CRYS ER 20 MEQ PO TBCR: 20.0000 meq | EXTENDED_RELEASE_TABLET | Freq: Two times a day (BID) | ORAL | Status: DC

## 2015-09-29 ENCOUNTER — Encounter: Payer: Self-pay | Admitting: Cardiovascular Disease

## 2015-10-13 ENCOUNTER — Ambulatory Visit (INDEPENDENT_AMBULATORY_CARE_PROVIDER_SITE_OTHER): Payer: Medicare Other | Admitting: Cardiovascular Disease

## 2015-10-13 ENCOUNTER — Encounter: Payer: Self-pay | Admitting: Cardiovascular Disease

## 2015-10-13 VITALS — BP 140/90 | HR 58 | Ht 69.5 in | Wt 231.1 lb

## 2015-10-13 DIAGNOSIS — I480 Paroxysmal atrial fibrillation: Secondary | ICD-10-CM | POA: Diagnosis not present

## 2015-10-13 DIAGNOSIS — Z79899 Other long term (current) drug therapy: Secondary | ICD-10-CM

## 2015-10-13 LAB — BASIC METABOLIC PANEL
BUN: 18 mg/dL (ref 7–25)
CALCIUM: 9.2 mg/dL (ref 8.6–10.4)
CHLORIDE: 104 mmol/L (ref 98–110)
CO2: 27 mmol/L (ref 20–31)
CREATININE: 0.88 mg/dL (ref 0.50–0.99)
GLUCOSE: 82 mg/dL (ref 65–99)
Potassium: 4.1 mmol/L (ref 3.5–5.3)
Sodium: 141 mmol/L (ref 135–146)

## 2015-10-13 LAB — MAGNESIUM: Magnesium: 2.3 mg/dL (ref 1.5–2.5)

## 2015-10-13 NOTE — Patient Instructions (Signed)
Medication Instructions:  Your physician recommends that you continue on your current medications as directed. Please refer to the Current Medication list given to you today.   Labwork: Your physician recommends that you have lab work today: BMET/MAG    Testing/Procedures: -None  Follow-Up: Your physician recommends that you keep your scheduled follow-up appointment in December with Dr.Nahser   Any Other Special Instructions Will Be Listed Below (If Applicable).     If you need a refill on your cardiac medications before your next appointment, please call your pharmacy.

## 2015-10-13 NOTE — Progress Notes (Signed)
Cardiology Office Note   Date:  10/13/2015   ID:  [name], DOB [birth year], MRN [id number]  PCP:  Gwen Pounds, MD  Cardiologist:   Kristeen Miss, MD   Chief Complaint  Patient presents with  . Follow-up    atrial fib   1. Atrial fibrillation 2. Hyperlipidemia 3. Diastolic congestive heart failure    65 year old female with a history of atrial fibrillation. She's been well controlled on Tikosyn. She also has a history of hyperlipidemia and is on Lipitor. She's been on chronic Pradaxa therapy for anticoagulation.  February 20, 2012 [name] has done well since I last saw her. 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 She has occasional palpitations but overall she's doing quite well. She is exercising ( water aerobics) twice a week.  Feb. 24, 2015:  [name] is doing well. No problems. Maintaining NSR.  Sept. 16, 2015:  She was in the ER this past week. CP, dyspnea .. CT angio was negative for PE - showed ? Of pneumonia. Was treated wit Abx. Does well in the am's .[name] and perhaps more short of breath in the afternoons.   April 05, 2014: [name] is a 65 y.o. female who presents for follow-up of her atrial fibrillation. She has continued to have some DOE,  Is concerned about the atorvastatin. Was seen in the lipid clinic.    Sept. 30, 2016:  Doing well.  No CP , no dyspnea. Has lost 11 lbs since last year.  Is in NSR  April 10, 2015:  [name] is doing ok from a cardiology standpoint. Has had some dental issues   Sept.  22, 2017:  [name] is seen for follow up of her atrial fib.    No  CP or dyspnea. No further episodes of atrial fib.    Past Medical History:  Diagnosis Date  . Back pain    nerve problems-had series of three injections  . Chronic anticoagulation   . Diastolic congestive heart failure (HCC)   . Dyspnea   . Hyperlipemia   . Migraine headache   . Paroxysmal atrial fibrillation (HCC)     . Restrictive cardiomyopathy (HCC)   . Tubal pregnancy     Past Surgical History:  Procedure Laterality Date  . BREAST LUMPECTOMY     benign  . CARDIAC CATHETERIZATION    . CARDIOVERSION    . CHOLECYSTECTOMY    . ECTOPIC PREGNANCY SURGERY  1980  . EYE SURGERY     left eye injections x2-was having "stroke like" flashes of light  . HYSTEROSCOPY  6/05   and D&C-simple hyperplasia secondary PMP bleeding  . ROTATOR CUFF REPAIR     bilateral     Current Outpatient Prescriptions  Medication Sig Dispense Refill  . dofetilide (TIKOSYN) 125 MCG capsule Take 3 capsules (375 mcg total) by mouth 2 (two) times daily. 60 capsule 0  . furosemide (LASIX) 40 MG tablet Take 1 tablet (40 mg total) by mouth 2 (two) times daily. 20 tablet 0  . gabapentin (NEURONTIN) 600 MG tablet Take 900 mg by mouth at bedtime.     . lidocaine (XYLOCAINE) 2 % solution Use as directed 20 mLs in the mouth or throat daily as needed for mouth pain. Headache    . Multiple Vitamins-Minerals (VISION FORMULA PO) Take 1 tablet by mouth daily.     . potassium chloride SA (K-DUR,KLOR-CON) 20 MEQ tablet Take 1 tablet (20 mEq total) by mouth 2 (two) times  daily. 180 tablet 2  . rivaroxaban (XARELTO) 20 MG TABS tablet Take 1 tablet (20 mg total) by mouth daily with supper. 30 tablet 11  . spironolactone (ALDACTONE) 25 MG tablet Take 1 tablet (25 mg total) by mouth daily. 90 tablet 3   No current facility-administered medications for this visit.     Allergies:   Topamax [topiramate]; Codeine; Oxycodone; and Penicillins    Social History:  The patient  reports that she has never smoked. She has never used smokeless tobacco. She reports that she drinks alcohol. She reports that she does not use drugs.   Family History:  [name] family history includes Atrial fibrillation in her mother; Breast cancer (age of onset: 3150) in her sister; Cancer in her maternal grandmother and maternal uncle; Heart disease in her sister;  Hypertension in her father; Thyroid cancer in her sister.    ROS:  Please see the history of present illness.    Review of Systems: Constitutional:  denies fever, chills, diaphoresis, appetite change and fatigue.  HEENT: denies photophobia, eye pain, redness, hearing loss, ear pain, congestion, sore throat, rhinorrhea, sneezing, neck pain, neck stiffness and tinnitus.  Respiratory: admits to SOB, DOE, cough, chest tightness, and wheezing.  Cardiovascular: denies chest pain, palpitations and leg swelling.  Gastrointestinal: denies nausea, vomiting, abdominal pain, diarrhea, constipation, blood in stool.  Genitourinary: denies dysuria, urgency, frequency, hematuria, flank pain and difficulty urinating.  Musculoskeletal: denies  myalgias, back pain, joint swelling, arthralgias and gait problem.   Skin: denies pallor, rash and wound.  Neurological: denies dizziness, seizures, syncope, weakness, light-headedness, numbness and headaches.   Hematological: denies adenopathy, easy bruising, personal or family bleeding history.  Psychiatric/ Behavioral: denies suicidal ideation, mood changes, confusion, nervousness, sleep disturbance and agitation.       All other systems are reviewed and negative.    PHYSICAL EXAM: VS:  BP 140/90   Pulse (!) 58   Ht 5' 9.5" (1.765 m)   Wt 231 lb 1.9 oz (104.8 kg)   LMP 01/21/1990   BMI 33.64 kg/m  , BMI Body mass index is 33.64 kg/m. GEN: Well nourished, well developed, in no acute distress  HEENT: normal  Neck: no JVD, carotid bruits, or masses Cardiac: RRR; no murmurs, rubs, or gallops,no edema  Respiratory:  clear to auscultation bilaterally, normal work of breathing GI: soft, nontender, nondistended, + BS MS: no deformity or atrophy  Skin: warm and dry, no rash Neuro:  Strength and sensation are intact Psych: normal   EKG:  EKG is ordered today. The ekg ordered today demonstrates NSR at 60, no ST or T wave abn.   Qtc = 434 ms .    Recent  Labs: 04/20/2015: BUN 14; Creat 0.95; Magnesium 2.4; Potassium 4.2; Sodium 140    Lipid Panel    Component Value Date/Time   CHOL 161 10/22/2013 1501   TRIG 163.0 (H) 10/22/2013 1501   HDL 41.60 10/22/2013 1501   CHOLHDL 4 10/22/2013 1501   VLDL 32.6 10/22/2013 1501   LDLCALC 87 10/22/2013 1501      Wt Readings from Last 3 Encounters:  10/13/15 231 lb 1.9 oz (104.8 kg)  04/20/15 229 lb 12.8 oz (104.2 kg)  12/30/14 233 lb (105.7 kg)    ECG was ordered today  Sept. 30.  NSR at 64. Normal   Other studies Reviewed: Additional studies/ records that were reviewed today include: . Review of the above records demonstrates:    ASSESSMENT AND PLAN:  1. Atrial fibrillation -  she is currently maintaining normal sinus rhythm. She's currently on Tikosyn and Xarelto .    Will check BMP and Mag today .  QTC is 420 ms.  Will see her 6 months for OV, ECG , BMP and mag level.  2. Hyperlipidemia- managed by her primary MD   3. Diastolic congestive heart failure - she has done well with the lasix and aldactone.    Will check today    Current medicines are reviewed at length with the patient today.  The patient does not have concerns regarding medicines.  The following changes have been made:  See above.   Labs/ tests ordered today include:  No orders of the defined types were placed in this encounter.    Disposition:   FU with me in 3 months for OV and BMP and Mag level.       Kristeen Miss, MD  10/13/2015 8:13 AM    [facility] [address] [place], [place], [place]  [address] Phone: [phone]; Fax: [phone]

## 2016-01-01 ENCOUNTER — Encounter: Payer: Self-pay | Admitting: Cardiovascular Disease

## 2016-01-01 ENCOUNTER — Ambulatory Visit (INDEPENDENT_AMBULATORY_CARE_PROVIDER_SITE_OTHER): Payer: Medicare Other | Admitting: Cardiovascular Disease

## 2016-01-01 VITALS — BP 136/82 | HR 72 | Ht 69.5 in | Wt 232.1 lb

## 2016-01-01 DIAGNOSIS — I480 Paroxysmal atrial fibrillation: Secondary | ICD-10-CM | POA: Diagnosis not present

## 2016-01-01 LAB — BASIC METABOLIC PANEL
BUN: 16 mg/dL (ref 7–25)
CALCIUM: 9.6 mg/dL (ref 8.6–10.4)
CHLORIDE: 101 mmol/L (ref 98–110)
CO2: 27 mmol/L (ref 20–31)
CREATININE: 0.95 mg/dL (ref 0.50–0.99)
Glucose, Bld: 87 mg/dL (ref 65–99)
Potassium: 4.4 mmol/L (ref 3.5–5.3)
SODIUM: 138 mmol/L (ref 135–146)

## 2016-01-01 NOTE — Progress Notes (Signed)
Cardiology Office Note   Date:  01/01/2016   ID:  [name], DOB 1950/11/27, MRN [id number]  PCP:  Gwen Pounds, MD  Cardiologist:   Kristeen Miss, MD   Chief Complaint  Patient presents with  . Atrial Fibrillation   1. Atrial fibrillation 2. Hyperlipidemia 3. Diastolic congestive heart failure    65 year old female with a history of atrial fibrillation. She's been well controlled on Tikosyn. She also has a history of hyperlipidemia and is on Lipitor. She's been on chronic Pradaxa therapy for anticoagulation.  February 20, 2012 [name] has done well since I last saw her. 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 She has occasional palpitations but overall she's doing quite well. She is exercising ( water aerobics) twice a week.  Feb. 24, 2015:  [name] is doing well. No problems. Maintaining NSR.  Sept. 16, 2015:  She was in the ER this past week. CP, dyspnea .. CT angio was negative for PE - showed ? Of pneumonia. Was treated wit Abx. Does well in the am's .[name] and perhaps more short of breath in the afternoons.   April 05, 2014: [name] is a 65 y.o. female who presents for follow-up of her atrial fibrillation. She has continued to have some DOE,  Is concerned about the atorvastatin. Was seen in the lipid clinic.    Sept. 30, 2016:  Doing well.  No CP , no dyspnea. Has lost 11 lbs since last year.  Is in NSR  April 10, 2015:  [name] is doing ok from a cardiology standpoint. Has had some dental issues   Sept.  22, 2017:  [name] is seen for follow up of her atrial fib.    No  CP or dyspnea. No further episodes of atrial fib.   Dec. 11, 2017:  [name] is seen today for followup for her atrial fib  Has some DOE  Not exercising as much as she needs to  No CP    Past Medical History:  Diagnosis Date  . Back pain    nerve problems-had series of three injections  . Chronic anticoagulation   .  Diastolic congestive heart failure (HCC)   . Dyspnea   . Hyperlipemia   . Migraine headache   . Paroxysmal atrial fibrillation (HCC)   . Restrictive cardiomyopathy (HCC)   . Tubal pregnancy     Past Surgical History:  Procedure Laterality Date  . BREAST LUMPECTOMY     benign  . CARDIAC CATHETERIZATION    . CARDIOVERSION    . CHOLECYSTECTOMY    . ECTOPIC PREGNANCY SURGERY  1980  . EYE SURGERY     left eye injections x2-was having "stroke like" flashes of light  . HYSTEROSCOPY  6/05   and D&C-simple hyperplasia secondary PMP bleeding  . ROTATOR CUFF REPAIR     bilateral     Current Outpatient Prescriptions  Medication Sig Dispense Refill  . dofetilide (TIKOSYN) 125 MCG capsule Take 3 capsules (375 mcg total) by mouth 2 (two) times daily. 60 capsule 0  . furosemide (LASIX) 40 MG tablet Take 1 tablet (40 mg total) by mouth 2 (two) times daily. 20 tablet 0  . gabapentin (NEURONTIN) 600 MG tablet Take 900 mg by mouth at bedtime.     . lidocaine (XYLOCAINE) 2 % solution Use as directed 20 mLs in the mouth or throat daily as needed for mouth pain. Headache    . Multiple Vitamins-Minerals (VISION FORMULA PO) Take 1  tablet by mouth daily.     . potassium chloride SA (K-DUR,KLOR-CON) 20 MEQ tablet Take 1 tablet (20 mEq total) by mouth 2 (two) times daily. 180 tablet 2  . rivaroxaban (XARELTO) 20 MG TABS tablet Take 1 tablet (20 mg total) by mouth daily with supper. 30 tablet 11  . spironolactone (ALDACTONE) 25 MG tablet Take 1 tablet (25 mg total) by mouth daily. 90 tablet 3   No current facility-administered medications for this visit.     Allergies:   Topamax [topiramate]; Codeine; Oxycodone; and Penicillins    Social History:  The patient  reports that she has never smoked. She has never used smokeless tobacco. She reports that she drinks alcohol. She reports that she does not use drugs.   Family History:  [name] family history includes Atrial fibrillation in her mother;  Breast cancer (age of onset: 5150) in her sister; Cancer in her maternal grandmother and maternal uncle; Heart disease in her sister; Hypertension in her father; Thyroid cancer in her sister.    ROS:  Please see the history of present illness.    Review of Systems: Constitutional:  denies fever, chills, diaphoresis, appetite change and fatigue.  HEENT: denies photophobia, eye pain, redness, hearing loss, ear pain, congestion, sore throat, rhinorrhea, sneezing, neck pain, neck stiffness and tinnitus.  Respiratory: admits to SOB, DOE, cough, chest tightness, and wheezing.  Cardiovascular: denies chest pain, palpitations and leg swelling.  Gastrointestinal: denies nausea, vomiting, abdominal pain, diarrhea, constipation, blood in stool.  Genitourinary: denies dysuria, urgency, frequency, hematuria, flank pain and difficulty urinating.  Musculoskeletal: denies  myalgias, back pain, joint swelling, arthralgias and gait problem.   Skin: denies pallor, rash and wound.  Neurological: denies dizziness, seizures, syncope, weakness, light-headedness, numbness and headaches.   Hematological: denies adenopathy, easy bruising, personal or family bleeding history.  Psychiatric/ Behavioral: denies suicidal ideation, mood changes, confusion, nervousness, sleep disturbance and agitation.       All other systems are reviewed and negative.    PHYSICAL EXAM: VS:  BP 136/82   Pulse 72   Ht 5' 9.5" (1.765 m)   Wt 232 lb 1.9 oz (105.3 kg)   LMP 01/21/1990   SpO2 97%   BMI 33.79 kg/m  , BMI Body mass index is 33.79 kg/m. GEN: Well nourished, well developed, in no acute distress  HEENT: normal  Neck: no JVD, carotid bruits, or masses Cardiac: RRR; no murmurs, rubs, or gallops,no edema  Respiratory:  clear to auscultation bilaterally, normal work of breathing GI: soft, nontender, nondistended, + BS MS: no deformity or atrophy  Skin: warm and dry, no rash Neuro:  Strength and sensation are  intact Psych: normal   EKG:  EKG is ordered today. The ekg ordered today demonstrates sinus brady at 56.   QTc is 432 ms.    Recent Labs: 10/13/2015: BUN 18; Creat 0.88; Magnesium 2.3; Potassium 4.1; Sodium 141    Lipid Panel    Component Value Date/Time   CHOL 161 10/22/2013 1501   TRIG 163.0 (H) 10/22/2013 1501   HDL 41.60 10/22/2013 1501   CHOLHDL 4 10/22/2013 1501   VLDL 32.6 10/22/2013 1501   LDLCALC 87 10/22/2013 1501      Wt Readings from Last 3 Encounters:  01/01/16 232 lb 1.9 oz (105.3 kg)  10/13/15 231 lb 1.9 oz (104.8 kg)  04/20/15 229 lb 12.8 oz (104.2 kg)    ECG was ordered today  Sept. 30.  NSR at 64. Normal   Other studies  Reviewed: Additional studies/ records that were reviewed today include: . Review of the above records demonstrates:    ASSESSMENT AND PLAN:  1. Atrial fibrillation - she is currently maintaining normal sinus rhythm. She's currently on Tikosyn and Xarelto .    Will check BMP and Mag today .  QTC is 420 ms.  Will see her 3 months for OV, ECG , BMP and mag level.  2. Hyperlipidemia- managed by her primary MD   3. Diastolic congestive heart failure - she has done well with the lasix and aldactone.    Will check BMP today    Current medicines are reviewed at length with the patient today.  The patient does not have concerns regarding medicines.  The following changes have been made:  See above.   Labs/ tests ordered today include:  No orders of the defined types were placed in this encounter.   Disposition:   FU with me in 3 months for OV, ECG  and BMP and Mag level.      Kristeen Miss, MD  01/01/2016 8:23 AM    [facility] [address] [place], [place], [place]  [address] Phone: [phone]; Fax: [phone]

## 2016-01-01 NOTE — Patient Instructions (Signed)
Medication Instructions:  Your physician recommends that you continue on your current medications as directed. Please refer to the Current Medication list given to you today.   Labwork: TODAY - basic metabolic panel, magnesium   Testing/Procedures: None Ordered   Follow-Up: Your physician recommends that you schedule a follow-up appointment in: 3 months with Dr. Nahser   If you need a refill on your cardiac medications before your next appointment, please call your pharmacy.   Thank you for choosing [facility]! Michelle Swinyer, RN [phone]    

## 2016-01-02 LAB — MAGNESIUM: Magnesium: 2.2 mg/dL (ref 1.5–2.5)

## 2016-01-08 ENCOUNTER — Ambulatory Visit (INDEPENDENT_AMBULATORY_CARE_PROVIDER_SITE_OTHER): Payer: Medicare Other | Admitting: Obstetrics & Gynecology

## 2016-01-08 ENCOUNTER — Encounter: Payer: Self-pay | Admitting: Obstetrics & Gynecology

## 2016-01-08 VITALS — BP 120/58 | HR 60 | Resp 14 | Ht 69.0 in | Wt 232.8 lb

## 2016-01-08 DIAGNOSIS — Z01419 Encounter for gynecological examination (general) (routine) without abnormal findings: Secondary | ICD-10-CM

## 2016-01-08 NOTE — Progress Notes (Signed)
65 y.o. G1P0 MarriedCaucasianF here for annual exam.  Doing well.  No vaginal bleeding.  Pt had a good year but her husband had a heart attack.  Her father in law has been diagnosed with Parkinson's.  Her sister in law had a bilateral mastectomy this year.  Pt states she is "ready for 2018".    PCP:  Dr. Timothy Lassousso.  Sees him about every six months.  Has blood work scheduled in January.    Patient's last menstrual period was 01/21/1990.          Sexually active: Yes.    The current method of family planning is post menopausal status.    Exercising: No.  The patient does not participate in regular exercise at present. Smoker:  no  Health Maintenance: Pap:  12/30/14 negative, HR HPV negative  History of abnormal Pap:  no MMG:  06/26/15 BIRADS 1 negative  Colonoscopy: 11/04/05 normal.  Due.  Pt is going to have Dr. Timothy Lassousso do the referral.  Declines having me do this for her. BMD:   2011 with Dr. Timothy Lassousso  TDaP:  2016  Pneumonia vaccine(s):  Has done one Zostavax:   Done with Dr. Timothy Lassousso Hep C testing: has done with Dr. Timothy Lassousso Screening Labs: PCP, Hb today: PCP, Urine today: PCP   reports that she has never smoked. She has never used smokeless tobacco. She reports that she drinks alcohol. She reports that she does not use drugs.  Past Medical History:  Diagnosis Date  . Back pain    nerve problems-had series of three injections  . Chronic anticoagulation   . Diastolic congestive heart failure (HCC)   . Dyspnea   . Hyperlipemia   . Migraine headache   . Paroxysmal atrial fibrillation (HCC)   . Restrictive cardiomyopathy (HCC)   . Tubal pregnancy     Past Surgical History:  Procedure Laterality Date  . BREAST LUMPECTOMY     benign  . CARDIAC CATHETERIZATION    . CARDIOVERSION    . CHOLECYSTECTOMY    . ECTOPIC PREGNANCY SURGERY  1980  . EYE SURGERY     left eye injections x2-was having "stroke like" flashes of light  . HYSTEROSCOPY  6/05   and D&C-simple hyperplasia secondary PMP  bleeding  . ROTATOR CUFF REPAIR     bilateral    Current Outpatient Prescriptions  Medication Sig Dispense Refill  . dofetilide (TIKOSYN) 125 MCG capsule Take 3 capsules (375 mcg total) by mouth 2 (two) times daily. 60 capsule 0  . furosemide (LASIX) 40 MG tablet Take 1 tablet (40 mg total) by mouth 2 (two) times daily. 20 tablet 0  . gabapentin (NEURONTIN) 600 MG tablet Take 900 mg by mouth at bedtime.     . lidocaine (XYLOCAINE) 2 % solution Use as directed 20 mLs in the mouth or throat daily as needed for mouth pain. Headache    . Multiple Vitamins-Minerals (VISION FORMULA PO) Take 1 tablet by mouth daily.     . potassium chloride SA (K-DUR,KLOR-CON) 20 MEQ tablet Take 1 tablet (20 mEq total) by mouth 2 (two) times daily. 180 tablet 2  . rivaroxaban (XARELTO) 20 MG TABS tablet Take 1 tablet (20 mg total) by mouth daily with supper. 30 tablet 11  . spironolactone (ALDACTONE) 25 MG tablet Take 1 tablet (25 mg total) by mouth daily. 90 tablet 3   No current facility-administered medications for this visit.     Family History  Problem Relation Age of Onset  . Hypertension  Father   . Atrial fibrillation Mother   . Cancer Maternal Grandmother     mouth  . Cancer Maternal Uncle     unknown type  . Thyroid cancer Sister   . Breast cancer Sister 46  . Heart disease Sister     ? diag    ROS:  Pertinent items are noted in HPI.  Otherwise, a comprehensive ROS was negative.  Exam:   BP (!) 120/58 (BP Location: Right Arm, Patient Position: Sitting, Cuff Size: Normal)   Pulse 60   Resp 14   Ht 5\' 9"  (1.753 m)   Wt 232 lb 12.8 oz (105.6 kg)   LMP 01/21/1990   BMI 34.38 kg/m   Weight change: -1#  Height: 5\' 9"  (175.3 cm)  Ht Readings from Last 3 Encounters:  01/08/16 5\' 9"  (1.753 m)  01/01/16 5' 9.5" (1.765 m)  10/13/15 5' 9.5" (1.765 m)    General appearance: alert, cooperative and appears stated age Head: Normocephalic, without obvious abnormality, atraumatic Neck: no  adenopathy, supple, symmetrical, trachea midline and thyroid normal to inspection and palpation Lungs: clear to auscultation bilaterally Breasts: normal appearance, no masses or tenderness Heart: regular rate and rhythm Abdomen: soft, non-tender; bowel sounds normal; no masses,  no organomegaly Extremities: extremities normal, atraumatic, no cyanosis or edema Skin: Skin color, texture, turgor normal. No rashes or lesions Lymph nodes: Cervical, supraclavicular, and axillary nodes normal. No abnormal inguinal nodes palpated Neurologic: Grossly normal   Pelvic: External genitalia:  no lesions              Urethra:  normal appearing urethra with no masses, tenderness or lesions              Bartholins and Skenes: normal                 Vagina: normal appearing vagina with normal color and discharge, no lesions              Cervix: no lesions              Pap taken: No. Bimanual Exam:  Uterus:  normal size, contour, position, consistency, mobility, non-tender              Adnexa: normal adnexa and no mass, fullness, tenderness               Rectovaginal: Confirms               Anus:  normal sphincter tone, no lesions  Chaperone was present for exam.  A:      Well Woman with normal exam  PMP, no HRT  H/O paroxysmal atrial fibrillation, on baby ASA and Pradaxa  H/O heart failure due to unknown etiology.  Followed by Dr. Melburn Popper every six months Abdominal hernia.  Saw general surgery in consultation.  She has decided not to have this done.  P:  Mammogram yearly.  Doing 3D due Grade C breast density Pap and HR HPV 2016.  No pap obtained today. Vaccines are up to date. Has appt with Dr. Timothy Lasso in [place].  accines with Dr. Timothy Lasso.  Thinks she will have blood work then.  Is followed every six months [place] annually or prn

## 2016-02-02 ENCOUNTER — Other Ambulatory Visit: Payer: Self-pay | Admitting: Nurse Practitioner

## 2016-02-02 ENCOUNTER — Encounter: Payer: Self-pay | Admitting: Physician Assistant

## 2016-02-02 DIAGNOSIS — R0602 Shortness of breath: Secondary | ICD-10-CM

## 2016-02-02 DIAGNOSIS — K469 Unspecified abdominal hernia without obstruction or gangrene: Secondary | ICD-10-CM | POA: Insufficient documentation

## 2016-02-02 DIAGNOSIS — I5032 Chronic diastolic (congestive) heart failure: Secondary | ICD-10-CM

## 2016-02-02 NOTE — Progress Notes (Signed)
ECHO order placed per Dr. Elease Hashimoto for evaluation of patient's worsening SOB. She has a hx of chronic diastolic CHF and has an appointment on 1/16 with Dr. Elease Hashimoto

## 2016-02-05 ENCOUNTER — Ambulatory Visit (HOSPITAL_COMMUNITY)
Admission: RE | Admit: 2016-02-05 | Discharge: 2016-02-05 | Disposition: A | Discharge status: Home / Self Care | Payer: Medicare Other | Source: Ambulatory Visit | Attending: Cardiovascular Disease | Admitting: Cardiovascular Disease

## 2016-02-05 DIAGNOSIS — I5032 Chronic diastolic (congestive) heart failure: Secondary | ICD-10-CM | POA: Insufficient documentation

## 2016-02-05 DIAGNOSIS — I517 Cardiomegaly: Secondary | ICD-10-CM | POA: Insufficient documentation

## 2016-02-05 DIAGNOSIS — R0602 Shortness of breath: Secondary | ICD-10-CM | POA: Diagnosis not present

## 2016-02-05 NOTE — Progress Notes (Signed)
  Echocardiogram 2D Echocardiogram has been performed.  [name] 02/05/2016, 3:04 PM

## 2016-02-06 ENCOUNTER — Encounter: Payer: Self-pay | Admitting: Cardiovascular Disease

## 2016-02-06 ENCOUNTER — Ambulatory Visit (INDEPENDENT_AMBULATORY_CARE_PROVIDER_SITE_OTHER): Payer: Medicare Other | Admitting: Cardiovascular Disease

## 2016-02-06 VITALS — BP 130/100 | HR 68 | Ht 69.5 in | Wt 228.8 lb

## 2016-02-06 DIAGNOSIS — R0683 Snoring: Secondary | ICD-10-CM

## 2016-02-06 DIAGNOSIS — I5032 Chronic diastolic (congestive) heart failure: Secondary | ICD-10-CM

## 2016-02-06 DIAGNOSIS — I480 Paroxysmal atrial fibrillation: Secondary | ICD-10-CM | POA: Diagnosis not present

## 2016-02-06 NOTE — Patient Instructions (Signed)
Medication Instructions:  Your physician recommends that you continue on your current medications as directed. Please refer to the Current Medication list given to you today.   Labwork: None Ordered   Testing/Procedures: Your physician has recommended that you have a sleep study. This test records several body functions during sleep, including: brain activity, eye movement, oxygen and carbon dioxide blood levels, heart rate and rhythm, breathing rate and rhythm, the flow of air through your mouth and nose, snoring, body muscle movements, and chest and belly movement.  Your physician has recommended that you have a pulmonary function test. Pulmonary Function Tests are a group of tests that measure how well air moves in and out of your lungs.  Your physician has requested that you have a lexiscan myoview. For further information please visit [email or web address] Please follow instruction sheet, as given.   Follow-Up: Your physician recommends that you schedule a follow-up appointment in: 1 month with Dr. Elease Hashimoto.    If you need a refill on your cardiac medications before your next appointment, please call your pharmacy.   Thank you for choosing [facility]! Eligha Bridegroom, RN [phone]

## 2016-02-06 NOTE — Progress Notes (Signed)
Cardiology Office Note   Date:  02/06/2016   ID:  [name], DOB Apr 18, 1950, MRN [id number]  PCP:  Gwen Pounds, MD  Cardiologist:   Kristeen Miss, MD   Chief Complaint  Patient presents with  . Follow-up    chronic diastolic CHF   1. Atrial fibrillation 2. Hyperlipidemia 3. Diastolic congestive heart failure    66 year old female with a history of atrial fibrillation. She's been well controlled on Tikosyn. She also has a history of hyperlipidemia and is on Lipitor. She's been on chronic Pradaxa therapy for anticoagulation.  February 20, 2012 [name] has done well since I last saw her. 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 She has occasional palpitations but overall she's doing quite well. She is exercising ( water aerobics) twice a week.  Feb. 24, 2015:  [name] is doing well. No problems. Maintaining NSR.  Sept. 16, 2015:  She was in the ER this past week. CP, dyspnea .. CT angio was negative for PE - showed ? Of pneumonia. Was treated wit Abx. Does well in the am's .[name] and perhaps more short of breath in the afternoons.   April 05, 2014: [name] is a 66 y.o. female who presents for follow-up of her atrial fibrillation. She has continued to have some DOE,  Is concerned about the atorvastatin. Was seen in the lipid clinic.    Sept. 30, 2016:  Doing well.  No CP , no dyspnea. Has lost 11 lbs since last year.  Is in NSR  April 10, 2015:  [name] is doing ok from a cardiology standpoint. Has had some dental issues   Sept.  22, 2017:  [name] is seen for follow up of her atrial fib.    No  CP or dyspnea. No further episodes of atrial fib.   Dec. 11, 2017:  [name] is seen today for followup for her atrial fib  Has some DOE  Not exercising as much as she needs to  No CP   Jan. 16, 2018: [name] is seen today as a work in visit at the request of Dr. Timothy Lasso. Been having more shortness of breath and  was thought to have worsening congestive heart failure.  Has severe DOE with any exertion. We performed a cardiac cath in 2008 which revealed normal coronaries.   She went to [facility] and had a heart biopsy.   Was not told of any abnormality.  Does have dull chest pain with exertion and with sitting   She had an echocardiogram performed yesterday which revealed normal left ventricular systolic function. She does have grade 2 diastolic dysfunction. Her Lasix was increased last week.   She has not noticed any increase in urine output.  No change in her breathing .  Has noticed that she loses her breath if she does any exertion .  Cannot lay supine - has severe dyspnea.   Wt Readings from Last 3 Encounters:  02/06/16 228 lb 12.8 oz (103.8 kg)  01/08/16 232 lb 12.8 oz (105.6 kg)  01/01/16 232 lb 1.9 oz (105.3 kg)      Past Medical History:  Diagnosis Date  . Back pain    nerve problems-had series of three injections  . Chronic anticoagulation   . Diastolic congestive heart failure (HCC)   . Dyspnea   . Hyperlipemia   . Migraine headache   . Paroxysmal atrial fibrillation (HCC)   . Restrictive cardiomyopathy (HCC)   . Tubal pregnancy  Past Surgical History:  Procedure Laterality Date  . BREAST LUMPECTOMY     benign  . CARDIAC CATHETERIZATION    . CARDIOVERSION    . CHOLECYSTECTOMY    . ECTOPIC PREGNANCY SURGERY  1980  . EYE SURGERY     left eye injections x2-was having "stroke like" flashes of light  . HYSTEROSCOPY  6/05   and D&C-simple hyperplasia secondary PMP bleeding  . ROTATOR CUFF REPAIR     bilateral     Current Outpatient Prescriptions  Medication Sig Dispense Refill  . dofetilide (TIKOSYN) 125 MCG capsule Take 3 capsules (375 mcg total) by mouth 2 (two) times daily. 60 capsule 0  . furosemide (LASIX) 40 MG tablet Take 1 tablet (40 mg total) by mouth 2 (two) times daily. 20 tablet 0  . gabapentin (NEURONTIN) 600 MG tablet Take 900 mg by mouth at  bedtime.     . lidocaine (XYLOCAINE) 2 % solution Use as directed 20 mLs in the mouth or throat daily as needed for mouth pain. Headache    . Multiple Vitamins-Minerals (VISION FORMULA PO) Take 1 tablet by mouth daily.     . potassium chloride SA (K-DUR,KLOR-CON) 20 MEQ tablet Take 1 tablet (20 mEq total) by mouth 2 (two) times daily. 180 tablet 2  . rivaroxaban (XARELTO) 20 MG TABS tablet Take 1 tablet (20 mg total) by mouth daily with supper. 30 tablet 11  . spironolactone (ALDACTONE) 25 MG tablet Take 1 tablet (25 mg total) by mouth daily. 90 tablet 3   No current facility-administered medications for this visit.     Allergies:   Topamax [topiramate]; Codeine; Oxycodone; and Penicillins    Social History:  The patient  reports that she has never smoked. She has never used smokeless tobacco. She reports that she drinks alcohol. She reports that she does not use drugs.   Family History:  [name] family history includes Atrial fibrillation in her mother; Breast cancer (age of onset: 35) in her sister; Cancer in her maternal grandmother and maternal uncle; Heart disease in her sister; Hypertension in her father; Thyroid cancer in her sister.    ROS:  Please see the history of present illness.    Review of Systems: Constitutional:  denies fever, chills, diaphoresis, appetite change and fatigue.  HEENT: denies photophobia, eye pain, redness, hearing loss, ear pain, congestion, sore throat, rhinorrhea, sneezing, neck pain, neck stiffness and tinnitus.  Respiratory: admits to SOB, DOE, cough, chest tightness, and wheezing.  Cardiovascular: denies chest pain, palpitations and leg swelling.  Gastrointestinal: denies nausea, vomiting, abdominal pain, diarrhea, constipation, blood in stool.  Genitourinary: denies dysuria, urgency, frequency, hematuria, flank pain and difficulty urinating.  Musculoskeletal: denies  myalgias, back pain, joint swelling, arthralgias and gait problem.   Skin:  denies pallor, rash and wound.  Neurological: denies dizziness, seizures, syncope, weakness, light-headedness, numbness and headaches.   Hematological: denies adenopathy, easy bruising, personal or family bleeding history.  Psychiatric/ Behavioral: denies suicidal ideation, mood changes, confusion, nervousness, sleep disturbance and agitation.       All other systems are reviewed and negative.    PHYSICAL EXAM: VS:  BP (!) 130/100 (BP Location: Left Arm, Patient Position: Sitting, Cuff Size: Large)   Pulse 68   Ht 5' 9.5" (1.765 m)   Wt 228 lb 12.8 oz (103.8 kg)   LMP 01/21/1990   SpO2 96%   BMI 33.30 kg/m  , BMI Body mass index is 33.3 kg/m. GEN: Well nourished, well developed, in no acute  distress  HEENT: normal  Neck: no JVD, carotid bruits, or masses Cardiac: RRR; no murmurs, rubs, or gallops,no edema  Respiratory:  clear to auscultation bilaterally, normal work of breathing GI: soft, nontender, nondistended, + BS MS: no deformity or atrophy  Skin: warm and dry, no rash Neuro:  Strength and sensation are intact Psych: normal   EKG:  EKG is ordered today. The ekg ordered today demonstrates sinus brady at 56.   QTc is 432 ms.    Recent Labs: 01/01/2016: BUN 16; Creat 0.95; Magnesium 2.2; Potassium 4.4; Sodium 138    Lipid Panel    Component Value Date/Time   CHOL 161 10/22/2013 1501   TRIG 163.0 (H) 10/22/2013 1501   HDL 41.60 10/22/2013 1501   CHOLHDL 4 10/22/2013 1501   VLDL 32.6 10/22/2013 1501   LDLCALC 87 10/22/2013 1501      Wt Readings from Last 3 Encounters:  02/06/16 228 lb 12.8 oz (103.8 kg)  01/08/16 232 lb 12.8 oz (105.6 kg)  01/01/16 232 lb 1.9 oz (105.3 kg)    ECG was ordered today  Sept. 30.  NSR at 64. Normal   Other studies Reviewed: Additional studies/ records that were reviewed today include: . Review of the above records demonstrates:    ASSESSMENT AND PLAN:  1. Atrial fibrillation - she is currently maintaining normal sinus  rhythm. She's currently on Tikosyn and Xarelto .     2. Hyperlipidemia- managed by her primary MD   3. Diastolic congestive heart failure - she has done well with the lasix and aldactone.    Will check BMP today   4. Worsening shortness of breath. Check presents today with worsening shortness of breath. Her echocardiogram reveals normal left ventricle systolic function. She does have grade 2 diastolic dysfunction which is in actual improvement from her previous echocardiogram. Will have have her go back to her normal dose of lasix   She loses her breath if she exercises too much. She has some dull chest pain but had   normal coronaries  in 2008.  ? Neuromuscular issue - myasthemia gravis  ? collegen vascular disease -  ? CAD -  OSA - has been snoring loudly,   Wakes up more fatigued than when she went to bed.   Plan: 1. Lexiscan Myoview study 2. Full set of pulmonary function test 3. Sleep study 4. We will consider a cardiac MRI. I'll discuss the case with Dr. Gala Romney     Current medicines are reviewed at length with the patient today.  The patient does not have concerns regarding medicines.  The following changes have been made:  See above.   Labs/ tests ordered today include:  No orders of the defined types were placed in this encounter.   Disposition:   FU with me in 1-2 months for OV,    Kristeen Miss, MD  02/06/2016 10:37 AM    [facility] [address] [place], [place], [place]  [address] Phone: [phone]; Fax: [phone]

## 2016-02-12 ENCOUNTER — Telehealth (HOSPITAL_COMMUNITY): Payer: Self-pay | Admitting: *Deleted

## 2016-02-12 ENCOUNTER — Ambulatory Visit (INDEPENDENT_AMBULATORY_CARE_PROVIDER_SITE_OTHER): Payer: Medicare Other | Admitting: Physician Assistant

## 2016-02-12 ENCOUNTER — Encounter: Payer: Self-pay | Admitting: Physician Assistant

## 2016-02-12 VITALS — BP 138/76 | HR 62 | Ht 69.5 in | Wt 232.0 lb

## 2016-02-12 DIAGNOSIS — R0602 Shortness of breath: Secondary | ICD-10-CM

## 2016-02-12 DIAGNOSIS — Z01818 Encounter for other preprocedural examination: Secondary | ICD-10-CM | POA: Diagnosis not present

## 2016-02-12 DIAGNOSIS — Z7901 Long term (current) use of anticoagulants: Secondary | ICD-10-CM | POA: Diagnosis not present

## 2016-02-12 DIAGNOSIS — Z1211 Encounter for screening for malignant neoplasm of colon: Secondary | ICD-10-CM

## 2016-02-12 DIAGNOSIS — Z1212 Encounter for screening for malignant neoplasm of rectum: Principal | ICD-10-CM

## 2016-02-12 NOTE — Telephone Encounter (Signed)
Patient given detailed instructions per Myocardial Perfusion Study Information Sheet for the test on 02/14/16 at 1230. Patient notified to arrive 15 minutes early and that it is imperative to arrive on time for appointment to keep from having the test rescheduled.  If you need to cancel or reschedule your appointment, please call the office within 24 hours of your appointment. Failure to do so may result in a cancellation of your appointment, and a $50 no show fee. Patient verbalized understanding.Vee Bahe, Adelene Idler

## 2016-02-12 NOTE — Patient Instructions (Signed)
It has been recommended to you by your physician that you have a(n) colonoscopy completed. Per your request, we did not schedule the procedure(s) today. Please contact our office at [phone] should you decide to have the procedure completed. 

## 2016-02-12 NOTE — Progress Notes (Signed)
Chief Complaint: Consult for screening colonoscopy in a patient on anticoagulation  HPI:  [name] is a 66 year old Caucasian female with a past medical history of chronic anticoagulation, diastolic congestive heart failure, dyspnea, paroxysmal A. fib and restrictive cardiomyopathy, who was referred to me by Creola Corn, MD for a consult of a screening colonoscopy.   Per review of chart patient's last colonoscopy was 11/05/15 by Dr. Jarold Motto and was completely normal. Repeat was recommended in 10 years.   Per review of patient's chart it appears she is being worked up for shortness of breath. It was noted that she has grade 2 diastolic dysfunction which is actually better than her previous study on a recent echo 02/05/16 with an EF of 55-60%. She is scheduled for a pulmonary function test and sleep apnea test.   Today, the patient tells me that she acutely developed an increasing shortness of breath a week ago Friday, 02/02/16. She has been undergoing intensive workup for this since then. She tells me that even sitting still she feels that she can't catch her breath and this worsens if she tries to move at all. At night she is sitting up in order to sleep at all. She tells me a similar episode occurred many years ago and she actually had a heart biopsy done at [facility] which showed cardiomyopathy. She is still undergoing workup for this current shortness of breath. This makes her very tired. Patient denies any trouble with GI symptoms.   Patient denies fever, chills, blood in her stool, melena, change in bowel habits, weight loss, anorexia, heartburn, reflux or abdominal pain.  Past Medical History:  Diagnosis Date  . Back pain    nerve problems-had series of three injections  . Chronic anticoagulation   . Diastolic congestive heart failure (HCC)   . Dyspnea   . Hyperlipemia   . Migraine headache   . Paroxysmal atrial fibrillation (HCC)   . Restrictive cardiomyopathy (HCC)   . Tubal  pregnancy     Past Surgical History:  Procedure Laterality Date  . BREAST LUMPECTOMY     benign  . CARDIAC CATHETERIZATION    . CARDIOVERSION    . CHOLECYSTECTOMY    . ECTOPIC PREGNANCY SURGERY  1980  . EYE SURGERY     left eye injections x2-was having "stroke like" flashes of light  . HYSTEROSCOPY  6/05   and D&C-simple hyperplasia secondary PMP bleeding  . ROTATOR CUFF REPAIR     bilateral    Current Outpatient Prescriptions  Medication Sig Dispense Refill  . dofetilide (TIKOSYN) 125 MCG capsule Take 3 capsules (375 mcg total) by mouth 2 (two) times daily. 60 capsule 0  . furosemide (LASIX) 40 MG tablet Take 1 tablet (40 mg total) by mouth 2 (two) times daily. 20 tablet 0  . gabapentin (NEURONTIN) 600 MG tablet Take 900 mg by mouth at bedtime.     . lidocaine (XYLOCAINE) 2 % solution Use as directed 20 mLs in the mouth or throat daily as needed for mouth pain. Headache    . metoCLOPramide (REGLAN) 10 MG tablet Take 10 mg by mouth 4 (four) times daily.    . Multiple Vitamins-Minerals (VISION FORMULA PO) Take 1 tablet by mouth daily.     . potassium chloride SA (K-DUR,KLOR-CON) 20 MEQ tablet Take 1 tablet (20 mEq total) by mouth 2 (two) times daily. 180 tablet 2  . rivaroxaban (XARELTO) 20 MG TABS tablet Take 1 tablet (20 mg total) by mouth daily with supper.  30 tablet 11  . spironolactone (ALDACTONE) 25 MG tablet Take 1 tablet (25 mg total) by mouth daily. 90 tablet 3   No current facility-administered medications for this visit.     Allergies as of 02/12/2016 - Review Complete 02/12/2016  Allergen Reaction Noted  . Topamax [topiramate] Other (See Comments) 12/30/2014  . Codeine    . Oxycodone  04/12/2010  . Penicillins Rash     Family History  Problem Relation Age of Onset  . Hypertension Father   . Atrial fibrillation Mother   . Cancer Maternal Grandmother     mouth  . Cancer Maternal Uncle     unknown type  . Thyroid cancer Sister   . Breast cancer Sister 7  .  Heart disease Sister     ? diag    Social History   Social History  . Marital status: Married    Spouse name: N/A  . Number of children: 0  . Years of education: college   Occupational History  .      retired   Social History Main Topics  . Smoking status: Never Smoker  . Smokeless tobacco: Never Used  . Alcohol use 0.0 oz/week     Comment: 1 per month  . Drug use: No  . Sexual activity: Yes   Other Topics Concern  . Not on file   Social History Narrative   Patient lives at home with her husband [name])  and she is retired. College education.   Patient is retired.   Caffeine- Two cups of caffeine daily .    Review of Systems:    Constitutional: No weight loss, fever or chills Skin: No rash Cardiovascular: No chest pain  Respiratory: Positive for shortness of breath Gastrointestinal: See HPI and otherwise negative Genitourinary: No dysuria  Neurological: No headache, dizziness or syncope Musculoskeletal: No new muscle or joint pain Hematologic: No bleeding Psychiatric: No history of depression or anxiety   Physical Exam:  Vital signs: BP 138/76   Pulse 62   Ht 5' 9.5" (1.765 m)   Wt 232 lb (105.2 kg)   LMP 01/21/1990   SpO2 99%   BMI 33.77 kg/m   Constitutional:   Pleasant overweight Caucasian female appears to be in NAD, Well developed, Well nourished, alert and cooperative Head:  Normocephalic and atraumatic. Eyes:   PEERL, EOMI. No icterus. Conjunctiva pink. Ears:  Normal auditory acuity. Neck:  Supple Throat: Oral cavity and pharynx without inflammation, swelling or lesion.  Respiratory: Respirations even. Lungs clear to auscultation bilaterally.   No wheezes, crackles, or rhonchi. Visualized strain to breathe, decreased breath sounds Cardiovascular: Normal S1, S2. No MRG. Regular rate and rhythm. No peripheral edema, cyanosis or pallor.  Gastrointestinal:  Soft, nondistended, nontender. No rebound or guarding. Normal bowel sounds. No appreciable  masses or hepatomegaly. Rectal:  Not performed.  Msk:  Symmetrical without gross deformities. Without edema, no deformity or joint abnormality.  Neurologic:  Alert and  oriented x4;  grossly normal neurologically.  Skin:   Dry and intact without significant lesions or rashes. Psychiatric: Demonstrates good judgement and reason without abnormal affect or behaviors.  RELEVANT LABS AND IMAGING: CBC    Component Value Date/Time   WBC 9.4 09/30/2013 1655   RBC 4.41 09/30/2013 1655   HGB 11.9 (L) 09/30/2013 1655   HCT 36.9 09/30/2013 1655   PLT 290 09/30/2013 1655   MCV 83.7 09/30/2013 1655   MCH 27.0 09/30/2013 1655   MCHC 32.2 09/30/2013 1655   RDW  15.1 09/30/2013 1655   MONOABS 0.5 03/12/2007 1735   EOSABS 0.2 03/12/2007 1735   BASOSABS 0.1 03/12/2007 1735    CMP     Component Value Date/Time   NA 138 01/01/2016 0839   K 4.4 01/01/2016 0839   CL 101 01/01/2016 0839   CO2 27 01/01/2016 0839   GLUCOSE 87 01/01/2016 0839   BUN 16 01/01/2016 0839   CREATININE 0.95 01/01/2016 0839   CALCIUM 9.6 01/01/2016 0839   ALKPHOS 132 (A) 08/31/2009   GFRNONAA 67 (L) 09/30/2013 1655   GFRAA 77 (L) 09/30/2013 1655    Assessment: 1. Preprocedural examination for colonoscopy in a patient on chronic anticoagulation: Patient's last colonoscopy in 2007 with Dr. Jarold Motto was normal, repeat was recommended in 10 years, patient is due now, she is maintained on Xarelto, but is currently undergoing workup for acute shortness of breath, so far normal echo and EKG, further workup to come in the next month  Plan: 1. Discussed with the patient that I recommend we hold off on a screening colonoscopy for now until her acute symptoms resolve/ are fully worked up and she is ok'd for procedures from pulmonary/cardiac standpoint. 2. Patient should call our clinic when acute symptoms have resolved, at that time she will need repeat office visit to ensure that we can go ahead and schedule screening  colonoscopy. 3. Did discuss risks, benefits, limitations and alternatives of the previous procedure and patient agrees to proceed when she feels better. This will be scheduled with Dr. Lavon Paganini in the future as she is the supervising physician today, location will need to be based on patient at time of repeat exam 4. Patient was advised that she will need to hold her Xarelto for at least 48 hours before time of her procedure. This will also need to be okay by her cardiologist. 5. Patient to return to clinic in the future in order to schedule screening colonoscopy  Hyacinth Meeker, PA-C [facility] 02/12/2016, 9:02 AM  Cc: Creola Corn, MD

## 2016-02-14 ENCOUNTER — Encounter: Payer: Self-pay | Admitting: Cardiovascular Disease

## 2016-02-14 ENCOUNTER — Ambulatory Visit (HOSPITAL_COMMUNITY): Payer: Medicare Other | Attending: Cardiology

## 2016-02-14 DIAGNOSIS — I5032 Chronic diastolic (congestive) heart failure: Secondary | ICD-10-CM | POA: Diagnosis not present

## 2016-02-14 MED ORDER — TECHNETIUM TC 99M TETROFOSMIN IV KIT
31.9000 | PACK | Freq: Once | INTRAVENOUS | Status: AC | PRN
  Administered 2016-02-14: 31.9 via INTRAVENOUS
  Filled 2016-02-14: qty 32

## 2016-02-14 MED ORDER — AMINOPHYLLINE 25 MG/ML IV SOLN
75.0000 mg | Freq: Once | INTRAVENOUS | Status: AC
  Administered 2016-02-14: 75 mg via INTRAVENOUS

## 2016-02-14 MED ORDER — REGADENOSON 0.4 MG/5ML IV SOLN
0.4000 mg | Freq: Once | INTRAVENOUS | Status: AC
  Administered 2016-02-14: 0.4 mg via INTRAVENOUS

## 2016-02-15 ENCOUNTER — Ambulatory Visit (HOSPITAL_COMMUNITY): Payer: Medicare Other | Attending: Internal Medicine

## 2016-02-15 LAB — MYOCARDIAL PERFUSION IMAGING
CHL CUP NUCLEAR SDS: 10
CHL CUP NUCLEAR SRS: 6
CHL CUP NUCLEAR SSS: 15
LHR: 0.38
LV dias vol: 67 mL (ref 46–106)
LV sys vol: 17 mL
NUC STRESS TID: 1.03
Peak HR: 80 {beats}/min
Rest HR: 65 {beats}/min

## 2016-02-15 MED ORDER — TECHNETIUM TC 99M TETROFOSMIN IV KIT
31.8000 | PACK | Freq: Once | INTRAVENOUS | Status: AC | PRN
  Administered 2016-02-15: 31.8 via INTRAVENOUS
  Filled 2016-02-15: qty 32

## 2016-02-16 ENCOUNTER — Ambulatory Visit (INDEPENDENT_AMBULATORY_CARE_PROVIDER_SITE_OTHER): Payer: Medicare Other | Admitting: Internal Medicine

## 2016-02-16 DIAGNOSIS — I480 Paroxysmal atrial fibrillation: Secondary | ICD-10-CM

## 2016-02-16 DIAGNOSIS — R0683 Snoring: Secondary | ICD-10-CM

## 2016-02-16 DIAGNOSIS — I5032 Chronic diastolic (congestive) heart failure: Secondary | ICD-10-CM

## 2016-02-16 LAB — PULMONARY FUNCTION TEST
DL/VA % pred: 83 %
DL/VA: 4.52 ml/min/mmHg/L
DLCO COR % PRED: 71 %
DLCO UNC: 23.85 ml/min/mmHg
DLCO cor: 23.23 ml/min/mmHg
DLCO unc % pred: 73 %
FEF 25-75 Post: 1.74 L/sec
FEF 25-75 Pre: 1.32 L/sec
FEF2575-%Change-Post: 31 %
FEF2575-%Pred-Post: 70 %
FEF2575-%Pred-Pre: 53 %
FEV1-%CHANGE-POST: 5 %
FEV1-%PRED-POST: 73 %
FEV1-%Pred-Pre: 69 %
FEV1-POST: 2.21 L
FEV1-Pre: 2.09 L
FEV1FVC-%Change-Post: 3 %
FEV1FVC-%PRED-PRE: 92 %
FEV6-%Change-Post: 2 %
FEV6-%PRED-POST: 78 %
FEV6-%PRED-PRE: 77 %
FEV6-POST: 2.97 L
FEV6-PRE: 2.91 L
FEV6FVC-%CHANGE-POST: 0 %
FEV6FVC-%PRED-POST: 103 %
FEV6FVC-%PRED-PRE: 103 %
FVC-%CHANGE-POST: 2 %
FVC-%PRED-POST: 76 %
FVC-%PRED-PRE: 74 %
FVC-POST: 2.99 L
FVC-PRE: 2.91 L
POST FEV6/FVC RATIO: 99 %
PRE FEV6/FVC RATIO: 100 %
Post FEV1/FVC ratio: 74 %
Pre FEV1/FVC ratio: 72 %
RV % PRED: 95 %
RV: 2.29 L
TLC % PRED: 91 %
TLC: 5.46 L

## 2016-02-16 NOTE — Progress Notes (Signed)
Reviewed and agree with documentation and assessment and plan. K. Veena Nandigam , MD   

## 2016-02-20 ENCOUNTER — Encounter: Payer: Self-pay | Admitting: Cardiovascular Disease

## 2016-02-23 ENCOUNTER — Telehealth: Payer: Self-pay | Admitting: Nurse Practitioner

## 2016-02-23 DIAGNOSIS — R0602 Shortness of breath: Secondary | ICD-10-CM

## 2016-02-23 NOTE — Telephone Encounter (Signed)
My appointment with sleep study is March 4th, with [name] is March 13th and with [name] is March 20th. Do we need to switch pulmonary doctor in order to have information back to you by my March 13th appointment? OR...could [name] be called by your office to get more details about his test results instead of me going to get them? It's just a long time to wait for answers when I'm having a hard time functioning. Thanks! [name]    Select Font Size    Called patient to discuss MyChart message. I called [name] and was able to get patient an appointment with [name] for Tuesday 2/6. Patient verbalized understanding and agreement and thanked me for the call.

## 2016-02-27 ENCOUNTER — Ambulatory Visit (INDEPENDENT_AMBULATORY_CARE_PROVIDER_SITE_OTHER): Payer: Medicare Other | Admitting: Pulmonary Disease

## 2016-02-27 ENCOUNTER — Encounter: Payer: Self-pay | Admitting: Pulmonary Disease

## 2016-02-27 VITALS — BP 128/78 | HR 66 | Ht 69.5 in | Wt 234.0 lb

## 2016-02-27 DIAGNOSIS — R0600 Dyspnea, unspecified: Secondary | ICD-10-CM

## 2016-02-27 DIAGNOSIS — R0602 Shortness of breath: Secondary | ICD-10-CM | POA: Diagnosis not present

## 2016-02-27 DIAGNOSIS — G4733 Obstructive sleep apnea (adult) (pediatric): Secondary | ICD-10-CM

## 2016-02-27 NOTE — Progress Notes (Signed)
Subjective:    Patient ID: [name], female    DOB: May 15, 1950, 66 y.o.   MRN: [id number]  HPI  Chief Complaint  Patient presents with  . Pulm Consult    For SOB and abnormal PFT.     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  She has a history of atrial fibrillation now controlled on Tikosyn. She reports diagnosis of restrictive cardiomyopathy with negative cardiac biopsy at [facility] in the past. She reports significant dyspnea both at rest and on exertion for at least a month. She reports an episode at church where she could barely stand and was struggling to take a deep breath and had to be taken out in a wheelchair. She also reports significant orthopnea for about a month and she has been sleeping in an inclined position. Echo and stress test did not show any abnormalities. She also reports episodic left-sided chest pain under her breast and radiates to her side, not related to breathing or exertion. She reports similar episode about 10 years ago for which she saw a neurologist and a rheumatologist and no diagnosis was made and symptoms seem to  resolved spontaneously after a few months. She reports tremors for many years, her sister also has this but no diagnosis of familial tremors has been made. She has a sleep study scheduled for March  Husband has noted loud snoring and witnessed apneas   I have reviewed all her imaging studies and tests noted below  Significant tests/ events  PFTs 01/2016 showed ratio 72, FVC 74%, FEV1 69%, TLC 91% and DLCO 73% suggesting moderate intraparenchymal restriction  PSG 2008 at [facility] showed total sleep time 362 minutes and a low AHI  Echo 01/2016 showed grade 2 diastolic dysfunction with RVSP 35  CT angiogram 09/2013 was negative for pulmonary emboli   Past Medical History:  Diagnosis Date  . Back pain    nerve problems-had series of three injections  .  Chronic anticoagulation   . Diastolic congestive heart failure (HCC)   . Dyspnea   . Hyperlipemia   . Migraine headache   . Paroxysmal atrial fibrillation (HCC)   . Restrictive cardiomyopathy (HCC)   . Tubal pregnancy     Past Surgical History:  Procedure Laterality Date  . BREAST LUMPECTOMY     benign  . CARDIAC CATHETERIZATION    . CARDIOVERSION    . CHOLECYSTECTOMY    . ECTOPIC PREGNANCY SURGERY  1980  . EYE SURGERY     left eye injections x2-was having "stroke like" flashes of light  . HYSTEROSCOPY  6/05   and D&C-simple hyperplasia secondary PMP bleeding  . ROTATOR CUFF REPAIR     bilateral    Allergies  Allergen Reactions  . Topamax [Topiramate] Other (See Comments)    hallucinations   . Codeine     VERTIGO, NAUSEA & VOMITTING  . Oxycodone     Hives   . Penicillins Rash     Social History   Social History  . Marital status: Married    Spouse name: N/A  . Number of children: 0  . Years of education: college   Occupational History  .      retired   Social History Main Topics  . Smoking status: Never Smoker  . Smokeless tobacco: Never Used  . Alcohol use 0.0 oz/week     Comment: 1 per month  . Drug use: No  . Sexual activity: Yes  Other Topics Concern  . Not on file   Social History Narrative   Patient lives at home with her husband [name])  and she is retired. College education.   Patient is retired.   Caffeine- Two cups of caffeine daily .     Family History  Problem Relation Age of Onset  . Hypertension Father   . Atrial fibrillation Mother   . Cancer Maternal Grandmother     mouth  . Cancer Maternal Uncle     unknown type  . Thyroid cancer Sister   . Breast cancer Sister 21  . Heart disease Sister     ? diag      Review of Systems  Constitutional: Negative for fever and unexpected weight change.  HENT: Negative for congestion, dental problem, ear pain, nosebleeds, postnasal drip, rhinorrhea, sinus pressure, sneezing, sore  throat and trouble swallowing.   Eyes: Negative for redness and itching.  Respiratory: Positive for cough and shortness of breath. Negative for chest tightness and wheezing.   Cardiovascular: Negative for palpitations and leg swelling.  Gastrointestinal: Negative for nausea and vomiting.  Genitourinary: Negative for dysuria.  Musculoskeletal: Negative for joint swelling.  Skin: Negative for rash.  Neurological: Negative for headaches.  Hematological: Does not bruise/bleed easily.  Psychiatric/Behavioral: Negative for dysphoric mood. The patient is not nervous/anxious.        Objective:   Physical Exam  Gen. Pleasant, obese, in no distress, normal affect ENT - no lesions, no post nasal drip, class 2-3 airway Neck: No JVD, no thyromegaly, no carotid bruits Lungs: no use of accessory muscles, no dullness to percussion, decreased without rales or rhonchi  Cardiovascular: Rhythm regular, heart sounds  normal, no murmurs or gallops, no peripheral edema Abdomen: soft and non-tender, no hepatosplenomegaly, BS normal. Musculoskeletal: No deformities, no cyanosis or clubbing Neuro:  alert, non focal, tremors +, power 5/5 all 4 extremities       Assessment & Plan:

## 2016-02-27 NOTE — Addendum Note (Signed)
Addended by: Oretha Milch on: 02/27/2016 05:48 PM   Modules accepted: Orders

## 2016-02-27 NOTE — Assessment & Plan Note (Addendum)
No clear cause of her dyspnea. PFTs do not show any evidence of airway obstruction, show moderate restriction with mild decrease in DLCO-however prior CT scans do not show any evidence of interstitial lung disease  I have some concern for neuromuscular cause-she does not clearly describe any diurnal variation in her symptoms to suspect myasthenia  Sniff test to check for weakness of your diaphragm, given the history of orthopnea  Make appointment with neurologist - she has a preference and will make her own appointment

## 2016-02-27 NOTE — Assessment & Plan Note (Signed)
Her prior study was reviewed and this did not show significant sleep disordered breathing. She has gained significant weight since then and it is possible that she may have developed OSA now. We will try to expedite her sleep study. If she does have OSA, hopefully CPAP will help with her orthopnea

## 2016-02-27 NOTE — Patient Instructions (Signed)
We will ask the sleep lab to expedite your sleep study and place you on a cancellation list  Sniff test to check for weakness of your diaphragm  Make appointment with neurologist

## 2016-02-28 ENCOUNTER — Telehealth: Payer: Self-pay

## 2016-02-28 NOTE — Telephone Encounter (Signed)
Left message for patient to call back regarding scan that was scheduled for 02/13.

## 2016-02-28 NOTE — Telephone Encounter (Signed)
Spoke to pt & gave her appt info for Sniff Test.  [name] states that is all that was needed.

## 2016-02-28 NOTE — Addendum Note (Signed)
Addended by: Maurene Capes on: 02/28/2016 10:37 AM   Modules accepted: Orders

## 2016-02-29 ENCOUNTER — Other Ambulatory Visit: Payer: Self-pay | Admitting: Cardiovascular Disease

## 2016-03-04 ENCOUNTER — Telehealth: Payer: Self-pay | Admitting: Neurology

## 2016-03-04 NOTE — Telephone Encounter (Signed)
rcv'd a referral Fr. Dr. 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 Terrace Arabia to Dr. Marjory Lies is it ok?

## 2016-03-04 NOTE — Telephone Encounter (Signed)
It is Ok to switch 

## 2016-03-04 NOTE — Telephone Encounter (Signed)
Ok to switch 

## 2016-03-05 ENCOUNTER — Ambulatory Visit (HOSPITAL_COMMUNITY): Payer: Medicare Other

## 2016-03-05 ENCOUNTER — Ambulatory Visit (HOSPITAL_COMMUNITY)
Admission: RE | Admit: 2016-03-05 | Discharge: 2016-03-05 | Disposition: A | Discharge status: Home / Self Care | Payer: Medicare Other | Source: Ambulatory Visit | Attending: Pulmonary Disease | Admitting: Pulmonary Disease

## 2016-03-05 DIAGNOSIS — J986 Disorders of diaphragm: Secondary | ICD-10-CM | POA: Diagnosis not present

## 2016-03-05 DIAGNOSIS — R0600 Dyspnea, unspecified: Secondary | ICD-10-CM

## 2016-03-06 ENCOUNTER — Telehealth: Payer: Self-pay | Admitting: Pulmonary Disease

## 2016-03-06 NOTE — Telephone Encounter (Signed)
Pt was returning my call about her sniff test results. Discussed results with patient. Patient verbalized understanding. Nothing else needed at the moment.

## 2016-03-12 ENCOUNTER — Ambulatory Visit: Payer: Medicare Other | Admitting: Physician Assistant

## 2016-03-12 ENCOUNTER — Ambulatory Visit (HOSPITAL_BASED_OUTPATIENT_CLINIC_OR_DEPARTMENT_OTHER): Payer: Medicare Other | Attending: Cardiovascular Disease | Admitting: Pulmonary Disease

## 2016-03-12 VITALS — Ht 69.5 in | Wt 230.0 lb

## 2016-03-12 DIAGNOSIS — G4733 Obstructive sleep apnea (adult) (pediatric): Secondary | ICD-10-CM | POA: Insufficient documentation

## 2016-03-12 DIAGNOSIS — R0683 Snoring: Secondary | ICD-10-CM | POA: Insufficient documentation

## 2016-03-12 DIAGNOSIS — R5383 Other fatigue: Secondary | ICD-10-CM | POA: Insufficient documentation

## 2016-03-12 DIAGNOSIS — Z79899 Other long term (current) drug therapy: Secondary | ICD-10-CM | POA: Diagnosis not present

## 2016-03-12 DIAGNOSIS — I480 Paroxysmal atrial fibrillation: Secondary | ICD-10-CM | POA: Diagnosis not present

## 2016-03-22 ENCOUNTER — Encounter: Payer: Self-pay | Admitting: *Deleted

## 2016-03-24 ENCOUNTER — Encounter (HOSPITAL_BASED_OUTPATIENT_CLINIC_OR_DEPARTMENT_OTHER): Payer: Medicare Other

## 2016-03-25 ENCOUNTER — Ambulatory Visit (INDEPENDENT_AMBULATORY_CARE_PROVIDER_SITE_OTHER): Payer: Medicare Other | Admitting: Diagnostic Neuroimaging

## 2016-03-25 ENCOUNTER — Encounter: Payer: Self-pay | Admitting: Diagnostic Neuroimaging

## 2016-03-25 VITALS — BP 127/84 | HR 62 | Ht 69.5 in | Wt 238.0 lb

## 2016-03-25 DIAGNOSIS — R251 Tremor, unspecified: Secondary | ICD-10-CM | POA: Diagnosis not present

## 2016-03-25 DIAGNOSIS — R0602 Shortness of breath: Secondary | ICD-10-CM | POA: Diagnosis not present

## 2016-03-25 NOTE — Progress Notes (Signed)
[facility]  PATIENT: [name] DOB: 22-Jan-1950  REFERRING CLINICIAN: Jessee Avers HISTORY FROM: patient  REASON FOR VISIT: new consult    HISTORICAL  CHIEF COMPLAINT:  Chief Complaint  Patient presents with  . Neurodegenerative disorder    rm 6, New Pt, husband- [name],  seen 2015- tremors, "hands shake; medication prescribed by [name] set off my vertigo"    HISTORY OF PRESENT ILLNESS:   66 year old female here for evaluation of shortness of breath and dyspnea on exertion. Since January 2018 patient has had intermittent problems with shortness of breath and dyspnea on exertion, and has had thorough evaluation by pulmonary and cardiology physicians, without specific cause found. She does have history of atrial fibrillation, atrial flutter, congestive heart failure, but these do not appear to be explain patient's dyspnea symptoms adequately, and therefore possibility of an occult neuromuscular disease has been raised and therefore patient presenting to me for further evaluation. Patient requested for me to see her for this problem due to her meeting me while I was treating one of her relatives.  Patient reports a variety of constitutional symptoms include chest pain, ringing in ears spinning sensation shortness of breath snoring tremor. Patient can feel shortness of breath when she exerts herself as well as at rest. Sometimes she thinks about a problem her symptoms can get worse. She denies any muscle twitching, muscle weakness focally.   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 Multiple family members on her father's side including father, sister, nephew and others have history of tremor. Some of them have tremor in chin, head and some in the hands. Patient has been tried on primidone in the past without relief and some aggravation of symptoms.    REVIEW OF SYSTEMS: Full 14 system review of systems  performed and negative with exception of: As per history of present illness.  ALLERGIES: Allergies  Allergen Reactions  . Topamax [Topiramate] Other (See Comments)    hallucinations   . Codeine     VERTIGO, NAUSEA & VOMITTING  . Oxycodone     Hives   . Penicillins Rash    HOME MEDICATIONS: Outpatient Medications Prior to Visit  Medication Sig Dispense Refill  . dofetilide (TIKOSYN) 125 MCG capsule Take 3 capsules (375 mcg total) by mouth 2 (two) times daily. 60 capsule 0  . furosemide (LASIX) 40 MG tablet TAKE 1 TABLET BY MOUTH TWO  TIMES DAILY 180 tablet 3  . gabapentin (NEURONTIN) 600 MG tablet Take 900 mg by mouth at bedtime.     . lidocaine (XYLOCAINE) 2 % solution Use as directed 20 mLs in the mouth or throat daily as needed for mouth pain. Headache    . metoCLOPramide (REGLAN) 10 MG tablet Take 10 mg by mouth 4 (four) times daily.    . Multiple Vitamins-Minerals (VISION FORMULA PO) Take 1 tablet by mouth daily.     . potassium chloride SA (K-DUR,KLOR-CON) 20 MEQ tablet TAKE 1 TABLET BY MOUTH TWO  TIMES DAILY 180 tablet 3  . spironolactone (ALDACTONE) 25 MG tablet TAKE 1 TABLET BY MOUTH  DAILY 90 tablet 3  . XARELTO 20 MG TABS tablet TAKE 1 TABLET BY MOUTH  DAILY WITH SUPPER 90 tablet 1   No facility-administered medications prior to visit.     PAST MEDICAL HISTORY: Past Medical History:  Diagnosis Date  . Back pain    nerve problems-had series of three injections  . Chronic  anticoagulation   . Diastolic congestive heart failure (HCC)   . Dyspnea   . Hyperlipemia   . Migraine headache   . Paroxysmal atrial fibrillation (HCC)   . Restrictive cardiomyopathy (HCC)   . Tubal pregnancy     PAST SURGICAL HISTORY: Past Surgical History:  Procedure Laterality Date  . BREAST LUMPECTOMY     benign  . CARDIAC CATHETERIZATION    . CARDIOVERSION    . CHOLECYSTECTOMY    . ECTOPIC PREGNANCY SURGERY  1980  . EYE SURGERY     left eye injections x2-was having "stroke like"  flashes of light  . heart biopsy  2008   [facility]  . HYSTEROSCOPY  6/05   and D&C-simple hyperplasia secondary PMP bleeding  . ROTATOR CUFF REPAIR     bilateral    FAMILY HISTORY: Family History  Problem Relation Age of Onset  . Hypertension Father   . Atrial fibrillation Mother   . Cancer Maternal Grandmother     mouth  . Cancer Maternal Uncle     unknown type  . Thyroid cancer Sister   . Breast cancer Sister     breast cancer  . Heart disease Sister     SOCIAL HISTORY:  Social History   Social History  . Marital status: Married    Spouse name: [name]  . Number of children: 0  . Years of education: college   Occupational History  .      retired   Social History Main Topics  . Smoking status: Never Smoker  . Smokeless tobacco: Never Used  . Alcohol use 0.0 oz/week     Comment: rarely  . Drug use: No  . Sexual activity: Yes   Other Topics Concern  . Not on file   Social History Narrative   Patient lives at home with her husband [name])  and she is retired. College education.   Patient is retired.   Caffeine- 2-3 cups tea daily .     PHYSICAL EXAM  GENERAL EXAM/CONSTITUTIONAL: Vitals:  Vitals:   03/25/16 1013  BP: 127/84  Pulse: 62  Weight: 238 lb (108 kg)  Height: 5' 9.5" (1.765 m)     Body mass index is 34.64 kg/m.  Visual Acuity Screening   Right eye Left eye Both eyes  Without correction: 20/40 20/50   With correction:        Patient is in no distress; well developed, nourished and groomed; neck is supple  CARDIOVASCULAR:  Examination of carotid arteries is normal; no carotid bruits  Regular rate and rhythm, no murmurs  Examination of peripheral vascular system by observation and palpation is normal  EYES:  Ophthalmoscopic exam of optic discs and posterior segments is normal; no papilledema or hemorrhages  MUSCULOSKELETAL:  Gait, strength, tone, movements noted in Neurologic exam below  NEUROLOGIC: MENTAL STATUS:  No  flowsheet data found.  awake, alert, oriented to person, place and time  recent and remote memory intact  normal attention and concentration  language fluent, comprehension intact, naming intact,   fund of knowledge appropriate  CRANIAL NERVE:   2nd - no papilledema on fundoscopic exam  2nd, 3rd, 4th, 6th - pupils equal and reactive to light, visual fields full to confrontation, extraocular muscles intact, no nystagmus  5th - facial sensation symmetric  7th - facial strength symmetric  8th - hearing intact  9th - palate elevates symmetrically, uvula midline  11th - shoulder shrug symmetric  12th - tongue protrusion midline  MILD VOICE AND  HEAD TREMOR  MOTOR:   normal bulk and tone, full strength in the BUE, BLE  POSTURAL TREMOR IN BUE  SENSORY:   normal and symmetric to light touch, temperature, vibration  COORDINATION:   finger-nose-finger, fine finger movements --> ACTION TREMOR  REFLEXES:   deep tendon reflexes present and symmetric  TRACE AT ANKLES  GAIT/STATION:   narrow based gait; able to walk on toes, heels and tandem; romberg is negative    DIAGNOSTIC DATA (LABS, IMAGING, TESTING) - I reviewed patient records, labs, notes, testing and imaging myself where available.  Lab Results  Component Value Date   WBC 9.4 09/30/2013   HGB 11.9 (L) 09/30/2013   HCT 36.9 09/30/2013   MCV 83.7 09/30/2013   PLT 290 09/30/2013      Component Value Date/Time   NA 138 01/01/2016 0839   K 4.4 01/01/2016 0839   CL 101 01/01/2016 0839   CO2 27 01/01/2016 0839   GLUCOSE 87 01/01/2016 0839   BUN 16 01/01/2016 0839   CREATININE 0.95 01/01/2016 0839   CALCIUM 9.6 01/01/2016 0839   ALKPHOS 132 (A) 08/31/2009   GFRNONAA 67 (L) 09/30/2013 1655   GFRAA 77 (L) 09/30/2013 1655   Lab Results  Component Value Date   CHOL 161 10/22/2013   HDL 41.60 10/22/2013   LDLCALC 87 10/22/2013   TRIG 163.0 (H) 10/22/2013   CHOLHDL 4 10/22/2013   No results found  for: HGBA1C No results found for: VITAMINB12 Lab Results  Component Value Date   TSH 2.13 12/04/2006     03/05/16 Dg Sniff chest - Normal diaphragmatic function.  06/12/12 MRI lumbar [I reviewed images myself and agree with interpretation. -VRP]  - No distinct cause of left hip pain is identified. - Curvature convex to the right with the apex at L2-3. - Shallow central disc herniation at L3-4 that contacts the thecal sac but does not appear to cause neural compression.  Mild facet arthropathy at this level that could conceivably be symptomatic. - L4-5:  Bulging of the disc.  Bilateral facet degeneration.  No apparent compressive stenosis.  The facet disease could possibly be symptomatic. - L5 S1:  Right foraminal to extraforaminal osteophyte and bulging disc could possibly irritate the right L5 nerve root.  Definite neural compression is not demonstrated however.     ASSESSMENT AND PLAN  66 y.o. year old female here with here with new onset dyspnea on exertion, shortness of breath, fatigue, since January 2018. Will check additional screening lab testing and MRI of the brain to look for neurologic causes of these breathing symptoms. Also with history of essential tremor.    Ddx: shortness of breath / fatigue (pulmonary, cardiac, neurologic) + ESSENTIAL TREMOR  1. Tremor   2. Shortness of breath      PLAN: - check CK, aldolase, achR antibody, A1c and TSH to screen for neuromuscular dz causes of dyspnea on exertion - check MRI brain to rule out CNS etiologies of tremor and weakness/fatigue - regarding tremor control, could consider beta-blocker (propranolol or metoprolol); will request input from cardiology to initiate given cardiac issues  Orders Placed This Encounter  Procedures  . MR BRAIN WO CONTRAST  . CK  . Aldolase  . Acetylcholine Receptor, Binding  . Hemoglobin A1C  . TSH   Return in about 2 months (around 05/25/2016).    Suanne Marker, MD 03/25/2016, 10:54  AM Certified in Neurology, Neurophysiology and Neuroimaging  [facility] [address] [place], [place]  [address] [phone]  

## 2016-03-25 NOTE — Patient Instructions (Signed)

## 2016-03-26 ENCOUNTER — Encounter: Payer: Self-pay | Admitting: Pulmonary Disease

## 2016-03-26 ENCOUNTER — Ambulatory Visit (INDEPENDENT_AMBULATORY_CARE_PROVIDER_SITE_OTHER): Payer: Medicare Other | Admitting: Pulmonary Disease

## 2016-03-26 DIAGNOSIS — R0600 Dyspnea, unspecified: Secondary | ICD-10-CM | POA: Diagnosis not present

## 2016-03-26 DIAGNOSIS — G4733 Obstructive sleep apnea (adult) (pediatric): Secondary | ICD-10-CM | POA: Diagnosis not present

## 2016-03-26 LAB — ACETYLCHOLINE RECEPTOR, BINDING

## 2016-03-26 LAB — HEMOGLOBIN A1C
ESTIMATED AVERAGE GLUCOSE: 120 mg/dL
HEMOGLOBIN A1C: 5.8 % — AB (ref 4.8–5.6)

## 2016-03-26 LAB — TSH: TSH: 3.83 u[IU]/mL (ref 0.450–4.500)

## 2016-03-26 LAB — CK: Total CK: 55 U/L (ref 24–173)

## 2016-03-26 LAB — ALDOLASE: Aldolase: 3.5 U/L (ref 3.3–10.3)

## 2016-03-26 NOTE — Patient Instructions (Addendum)
We could refer you to speech therapy for vocal cord dysfunction  For mild sleep apnea we will start trial of CPAP machine

## 2016-03-26 NOTE — Assessment & Plan Note (Addendum)
No obvious etiology, does not appear to be neuromuscular or diaphragm dysfunction Vocal cord dysfunction possible although the presentation is not classic  We could refer you to speech therapy for vocal cord dysfunction

## 2016-03-26 NOTE — Assessment & Plan Note (Signed)
Trial of auto CPAP 5-10 cm, nasal pillows Download in 4 weeks Reassess in 1 month for improvement in with her trial is helping She does seem to have daytime somnolence and fatigue

## 2016-03-26 NOTE — Progress Notes (Signed)
   Subjective:    Patient ID: [name], female    DOB: 06-15-1950, 66 y.o.   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  She has  atrial fibrillation controlled on Tikosyn. She reports diagnosis of restrictive cardiomyopathy with negative cardiac biopsy at [facility] in the past.   She continues to have dyspnea mostly on exertion starting in January 2018 of acute onset with a negative cardiac evaluation. She also has dyspnea on talking and her speech develops a tremor  After her initial consultation with me, she underwent a sniff test which did not show diaphragmatic dysfunction PSG 02/2016 showed mild OSA with AHI 8/hour She saw a neurologist and was diagnosed with essential tremor, myasthenia antibodies pending She reports similar episode about 10 years ago for which she saw a neurologist and a rheumatologist and no diagnosis was made and symptoms seem to  resolved spontaneously after a few months.   Significant tests/ events  PFTs 01/2016 showed ratio 72, FVC 74%, FEV1 69%, TLC 91% and DLCO 73% suggesting moderate intraparenchymal restriction  PSG 2008 at [facility] showed total sleep time 362 minutes and a low AHI  Echo 01/2016 showed grade 2 diastolic dysfunction with RVSP 35  CT angiogram 09/2013 was negative for pulmonary emboli  Review of Systems neg for any significant sore throat, dysphagia, itching, sneezing, nasal congestion or excess/ purulent secretions, fever, chills, sweats, unintended wt loss, pleuritic or exertional cp, hempoptysis, orthopnea pnd or change in chronic leg swelling. Also denies presyncope, palpitations, heartburn, abdominal pain, nausea, vomiting, diarrhea or change in bowel or urinary habits, dysuria,hematuria, rash, arthralgias, visual complaints, headache, numbness weakness or ataxia.     Objective:   Physical Exam  Gen. Pleasant, obese, in no  distress ENT - no lesions, no post nasal drip Neck: No JVD, no thyromegaly, no carotid bruits Lungs: no use of accessory muscles, no dullness to percussion, decreased without rales or rhonchi  Cardiovascular: Rhythm regular, heart sounds  normal, no murmurs or gallops, no peripheral edema Musculoskeletal: No deformities, no cyanosis or clubbing , no tremors        Assessment & Plan:

## 2016-03-27 ENCOUNTER — Telehealth: Payer: Self-pay | Admitting: *Deleted

## 2016-03-27 DIAGNOSIS — G4733 Obstructive sleep apnea (adult) (pediatric): Secondary | ICD-10-CM | POA: Diagnosis not present

## 2016-03-27 NOTE — Telephone Encounter (Signed)
Per Dr Marjory Lies, spoke with patient and informed her that her labs are unremarkable. Dr Marjory Lies will continue her current plan.  She has not heard about scheduling MRI. She stated she saw her pulmonologist, and will do a trial on CPAP. She has upcoming appt with cardiologist. She verbalized understanding, appreciation of call.

## 2016-03-27 NOTE — Procedures (Signed)
Patient Name: [name], [name] Date: 03/12/2016 Gender: Female D.O.B: 09-12-1950 Age (years): 59 Referring Provider: Kristeen Miss Height (inches): 70 Interpreting Physician: Cyril Mourning MD, ABSM Weight (lbs): 230 RPSGT: Armen Pickup BMI: 33 MRN: [id number] Neck Size: 16.00   CLINICAL INFORMATION Sleep Study Type: NPSG  Indication for sleep study: Snoring, fatigue  Epworth Sleepiness Score: 4     SLEEP STUDY TECHNIQUE As per the AASM Manual for the Scoring of Sleep and Associated Events v2.3 (April 2016) with a hypopnea requiring 4% desaturations.  The channels recorded and monitored were frontal, central and occipital EEG, electrooculogram (EOG), submentalis EMG (chin), nasal and oral airflow, thoracic and abdominal wall motion, anterior tibialis EMG, snore microphone, electrocardiogram, and pulse oximetry.  MEDICATIONS Medications self-administered by patient taken the night of the study : GABAPENTIN  SLEEP ARCHITECTURE The study was initiated at 10:03:07 PM and ended at 4:32:09 AM.  Sleep onset time was 60.9 minutes and the sleep efficiency was 66.8%. The total sleep time was 260.0 minutes.  Stage REM latency was 148.0 minutes.  The patient spent 1.35% of the night in stage N1 sleep, 37.31% in stage N2 sleep, 31.73% in stage N3 and 29.62% in REM.  Alpha intrusion was absent.  Supine sleep was 52.10%.  RESPIRATORY PARAMETERS The overall apnea/hypopnea index (AHI) was 6.5 per hour. There were 18 total apneas, including 18 obstructive, 0 central and 0 mixed apneas. There were 10 hypopneas and 14 RERAs.  The AHI during Stage REM sleep was 20.3 per hour.  AHI while supine was 12.4 per hour.  The mean oxygen saturation was 92.28%. The minimum SpO2 during sleep was 85.00%.  Moderate snoring was noted during this study.  CARDIAC DATA The 2 lead EKG demonstrated sinus rhythm. The mean heart rate was 56.89 beats per minute. Other EKG findings include:  None.   LEG MOVEMENT DATA The total PLMS were 179 with a resulting PLMS index of 41.31. Associated arousal with leg movement index was 0.0 .  IMPRESSIONS - Mild obstructive sleep apnea occurred during this study (AHI = 6.5/h).Events predominant during REM & supine sleep - No significant central sleep apnea occurred during this study (CAI = 0.0/h). - Mild oxygen desaturation was noted during this study (Min O2 = 85.00%). - The patient snored with Moderate snoring volume. - No cardiac abnormalities were noted during this study. - Moderate periodic limb movements of sleep occurred during the study. No significant associated arousals.   DIAGNOSIS - Obstructive Sleep Apnea (327.23 [G47.33 ICD-10])   RECOMMENDATIONS - Positional therapy avoiding supine position during sleep. - Very mild obstructive sleep apnea. Return to discuss treatment options including CPAP or oral appliance or weight loss - Avoid alcohol, sedatives and other CNS depressants that may worsen sleep apnea and disrupt normal sleep architecture. - Sleep hygiene should be reviewed to assess factors that may improve sleep quality. - Weight management and regular exercise should be initiated or continued if appropriate.   Cyril Mourning MD Board Certified in Sleep medicine

## 2016-04-02 ENCOUNTER — Ambulatory Visit (INDEPENDENT_AMBULATORY_CARE_PROVIDER_SITE_OTHER): Payer: Medicare Other | Admitting: Cardiovascular Disease

## 2016-04-02 ENCOUNTER — Encounter: Payer: Self-pay | Admitting: Cardiovascular Disease

## 2016-04-02 VITALS — BP 140/82 | HR 65 | Ht 69.0 in | Wt 233.1 lb

## 2016-04-02 DIAGNOSIS — I5032 Chronic diastolic (congestive) heart failure: Secondary | ICD-10-CM | POA: Diagnosis not present

## 2016-04-02 DIAGNOSIS — Z79899 Other long term (current) drug therapy: Secondary | ICD-10-CM | POA: Diagnosis not present

## 2016-04-02 DIAGNOSIS — G4733 Obstructive sleep apnea (adult) (pediatric): Secondary | ICD-10-CM | POA: Diagnosis not present

## 2016-04-02 NOTE — Progress Notes (Signed)
Cardiology Office Note   Date:  04/02/2016   ID:  [name], DOB 03-12-1950, MRN [id number]  PCP:  [name] Pounds, MD  Cardiologist:   [name] Miss, MD   Chief Complaint  Patient presents with  . Follow-up    diastolic CHF, PAF    1. Atrial fibrillation 2. Hyperlipidemia 3. Diastolic congestive heart failure    66 year old female with a history of atrial fibrillation. She's been well controlled on Tikosyn. She also has a history of hyperlipidemia and is on Lipitor. She's been on chronic Pradaxa therapy for anticoagulation.  February 20, 2012 [name] has done well since I last saw her. 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 She has occasional palpitations but overall she's doing quite well. She is exercising ( water aerobics) twice a week.  Feb. 24, 2015:  [name] is doing well. No problems. Maintaining NSR.  Sept. 16, 2015:  She was in the ER this past week. CP, dyspnea .. CT angio was negative for PE - showed ? Of pneumonia. Was treated wit Abx. Does well in the am's .[name] and perhaps more short of breath in the afternoons.   April 05, 2014: [name] is a 66 y.o. female who presents for follow-up of her atrial fibrillation. She has continued to have some DOE,  Is concerned about the atorvastatin. Was seen in the lipid clinic.    Sept. 30, 2016:  Doing well.  No CP , no dyspnea. Has lost 11 lbs since last year.  Is in NSR  April 10, 2015:  [name] is doing ok from a cardiology standpoint. Has had some dental issues   Sept.  22, 2017:  [name] is seen for follow up of her atrial fib.    No  CP or dyspnea. No further episodes of atrial fib.   Dec. 11, 2017:  [name] is seen today for followup for her atrial fib  Has some DOE  Not exercising as much as she needs to  No CP   Jan. 16, 2018: [name] is seen today as a work in visit at the request of Dr. Timothy [name]. Been having more shortness of breath and  was thought to have worsening congestive heart failure.  Has severe DOE with any exertion. We performed a cardiac cath in 2008 which revealed normal coronaries.   She went to [facility] and had a heart biopsy.   Was not told of any abnormality.  Does have dull chest pain with exertion and with sitting   She had an echocardiogram performed yesterday which revealed normal left ventricular systolic function. She does have grade 2 diastolic dysfunction. Her Lasix was increased last week.   She has not noticed any increase in urine output.  No change in her breathing .  Has noticed that she loses her breath if she does any exertion .  Cannot lay supine - has severe dyspnea.   April 02, 2016:  [name] is seen today for follow up visit .    Seen with husband , [name]  She had an episode of PAF yesterday  - lasted for several hours.   Is back in NSR currently .  Had an episode in Feb. That lasted 2-3 hours , HR of 129 ,  She was very short of breath with that.   -Sleep study on Feb. 20 shows mild OSA -PFTs 01/2016 showed ratio 72, FVC 74%, FEV1 69%, TLC 91% and DLCO 73% suggesting moderate intraparenchymal restriction  -  echo shows normal LV systolic function with grade 2 diastolic dysfunction. She has an estimated PA pressure of 35.  Has developed a tremor.   Is seeing a neurologist .    Wt Readings from Last 3 Encounters:  04/02/16 233 lb 1.9 oz (105.7 kg)  03/26/16 233 lb (105.7 kg)  03/25/16 238 lb (108 kg)      Past Medical History:  Diagnosis Date  . Back pain    nerve problems-had series of three injections  . Chronic anticoagulation   . Diastolic congestive heart failure (HCC)   . Dyspnea   . Hyperlipemia   . Migraine headache   . Paroxysmal atrial fibrillation (HCC)   . Restrictive cardiomyopathy (HCC)   . Tubal pregnancy     Past Surgical History:  Procedure Laterality Date  . BREAST LUMPECTOMY     benign  . CARDIAC CATHETERIZATION    . CARDIOVERSION    .  CHOLECYSTECTOMY    . ECTOPIC PREGNANCY SURGERY  1980  . EYE SURGERY     left eye injections x2-was having "stroke like" flashes of light  . heart biopsy  2008   [facility]  . HYSTEROSCOPY  6/05   and D&C-simple hyperplasia secondary PMP bleeding  . ROTATOR CUFF REPAIR     bilateral     Current Outpatient Prescriptions  Medication Sig Dispense Refill  . dofetilide (TIKOSYN) 125 MCG capsule Take 3 capsules (375 mcg total) by mouth 2 (two) times daily. 60 capsule 0  . furosemide (LASIX) 40 MG tablet TAKE 1 TABLET BY MOUTH TWO  TIMES DAILY 180 tablet 3  . gabapentin (NEURONTIN) 600 MG tablet Take 900 mg by mouth at bedtime.     . lidocaine (XYLOCAINE) 2 % solution Use as directed 20 mLs in the mouth or throat daily as needed for mouth pain. Headache    . metoCLOPramide (REGLAN) 10 MG tablet Take 10 mg by mouth 4 (four) times daily.    . Multiple Vitamins-Minerals (VISION FORMULA PO) Take 1 tablet by mouth daily.     . potassium chloride SA (K-DUR,KLOR-CON) 20 MEQ tablet TAKE 1 TABLET BY MOUTH TWO  TIMES DAILY 180 tablet 3  . spironolactone (ALDACTONE) 25 MG tablet TAKE 1 TABLET BY MOUTH  DAILY 90 tablet 3  . XARELTO 20 MG TABS tablet TAKE 1 TABLET BY MOUTH  DAILY WITH SUPPER 90 tablet 1   No current facility-administered medications for this visit.     Allergies:   Topamax [topiramate]; Codeine; Oxycodone; and Penicillins    Social History:  The patient  reports that she has never smoked. She has never used smokeless tobacco. She reports that she drinks alcohol. She reports that she does not use drugs.   Family History:  [name] family history includes Atrial fibrillation in her mother; Breast cancer in her sister; Cancer in her maternal grandmother and maternal uncle; Heart disease in her sister; Hypertension in her father; Thyroid cancer in her sister.    ROS:  Please see the history of present illness.    Review of Systems: Constitutional:  denies fever, chills, diaphoresis,  appetite change and fatigue.  HEENT: denies photophobia, eye pain, redness, hearing loss, ear pain, congestion, sore throat, rhinorrhea, sneezing, neck pain, neck stiffness and tinnitus.  Respiratory: admits to SOB, DOE, cough, chest tightness, and wheezing.  Cardiovascular: denies chest pain, palpitations and leg swelling.  Gastrointestinal: denies nausea, vomiting, abdominal pain, diarrhea, constipation, blood in stool.  Genitourinary: denies dysuria, urgency, frequency, hematuria, flank pain and  difficulty urinating.  Musculoskeletal: denies  myalgias, back pain, joint swelling, arthralgias and gait problem.   Skin: denies pallor, rash and wound.  Neurological: denies dizziness, seizures, syncope, weakness, light-headedness, numbness and headaches.   Hematological: denies adenopathy, easy bruising, personal or family bleeding history.  Psychiatric/ Behavioral: denies suicidal ideation, mood changes, confusion, nervousness, sleep disturbance and agitation.       All other systems are reviewed and negative.    PHYSICAL EXAM: VS:  BP 140/82   Pulse 65   Ht 5\' 9"  (1.753 m)   Wt 233 lb 1.9 oz (105.7 kg)   LMP 01/21/1990   SpO2 95%   BMI 34.43 kg/m  , BMI Body mass index is 34.43 kg/m. GEN: Well nourished, well developed, in no acute distress  HEENT: normal  Neck: no JVD, carotid bruits, or masses Cardiac: RRR; no murmurs, rubs, or gallops,no edema  Respiratory:  clear to auscultation bilaterally, normal work of breathing GI: soft, nontender, nondistended, + BS MS: no deformity or atrophy  Skin: warm and dry, no rash Neuro:  Strength and sensation are intact Psych: normal   EKG:  EKG is ordered today. The ekg ordered today demonstrates sinus brady at 56.   QTc is 432 ms.    Recent Labs: 01/01/2016: BUN 16; Creat 0.95; Magnesium 2.2; Potassium 4.4; Sodium 138 03/25/2016: TSH 3.830    Lipid Panel    Component Value Date/Time   CHOL 161 10/22/2013 1501   TRIG 163.0 (H)  10/22/2013 1501   HDL 41.60 10/22/2013 1501   CHOLHDL 4 10/22/2013 1501   VLDL 32.6 10/22/2013 1501   LDLCALC 87 10/22/2013 1501      Wt Readings from Last 3 Encounters:  04/02/16 233 lb 1.9 oz (105.7 kg)  03/26/16 233 lb (105.7 kg)  03/25/16 238 lb (108 kg)    ECG was ordered today  April 02, 2016:   NSR at 12.   QTc of 420.   Normal   Other studies Reviewed: Additional studies/ records that were reviewed today include: . Review of the above records demonstrates:    ASSESSMENT AND PLAN:  1. Atrial fibrillation - she is currently maintaining normal sinus rhythm. She's currently on Tikosyn and Xarelto .     2. Hyperlipidemia- managed by her primary MD   3. Diastolic congestive heart failure - she has done well with the lasix and aldactone.        4. Worsening shortness of breath. Check presents today with worsening shortness of breath. Her echocardiogram reveals normal left ventricle systolic function. She does have grade 2 diastolic dysfunction which is in actual improvement from her previous echocardiogram.  Still very short of breath. I suspect this is mostly due to her pulmonary disease (reduced diffusion capacity )  I have offered to do a CPX, cardiac MRI or referral to the CHF clinic.   She loses her breath if she exercises too much. She has some dull chest pain but had   normal coronaries  in 2008. Normal cardiac biopsy in 2008 at [facility]     We will consider a cardiac MRI.  She wants to do the CPX first and then consider the MRI    Current medicines are reviewed at length with the patient today.  The patient does not have concerns regarding medicines.  The following changes have been made:  See above.   Labs/ tests ordered today include:  No orders of the defined types were placed in this encounter.   Disposition:  FU with me in 3 months for OV,    [name] Miss, MD  04/02/2016 10:07 AM    [facility] [address] [place],  [place], [place]  [address] Phone: [phone]; Fax: [phone]

## 2016-04-02 NOTE — Patient Instructions (Addendum)
Medication Instructions:    Your physician recommends that you continue on your current medications as directed. Please refer to the Current Medication list given to you today.  --- If you need a refill on your cardiac medications before your next appointment, please call your pharmacy. ---  Labwork:  None ordered  Testing/Procedures: Your physician has recommended that you have a cardiopulmonary stress test (CPX). CPX testing is a non-invasive measurement of heart and lung function. It replaces a traditional treadmill stress test. This type of test provides a tremendous amount of information that relates not only to your present condition but also for future outcomes. This test combines measurements of you ventilation, respiratory gas exchange in the lungs, electrocardiogram (EKG), blood pressure and physical response before, during, and following an exercise protocol.  Follow-Up:  Your physician recommends that you schedule a follow-up appointment in: 3 months with Dr. Elease Hashimoto.  You will have lab work at this appointment.  Thank you for choosing [facility]!!     Any Other Special Instructions Will Be Listed Below (If Applicable).   Cardiopulmonary Exercise Stress Test Cardiopulmonary exercise testing (CPET) is a computerized test used to evaluate how well your heart and lungs are able to respond to exercise. This is called your exercise capacity. During this test, you will walk or run on a treadmill or pedal on a stationary bike while tests are done on your heart and lungs. This test may be done to evaluate:  Unexplained shortness of breath.  Exercise intolerance. This is useful for people who have heart failure or coronary artery disease (CAD).  Lung function. This is useful for people who have chronic obstructive pulmonary disease (COPD), pulmonary hypertension, or cystic fibrosis.  Heart function. This is useful for people who have heart disease or heart  failure.  Response to a cardiac or pulmonary rehabilitation program.  If you have a heart or lung problem.  Whether you are healthy enough to have surgery. Tell a health care provider about:  Any allergies you have.  All medicines you are taking, including vitamins, herbs, eye drops, creams, and over-the-counter medicines.  Any problems you or family members have had with anesthetic medicines.  Any blood disorders you have.  Any surgeries you have had.  Any medical conditions you have.  Whether you are pregnant or may be pregnant. What are the risks? Generally, this is a safe procedure. However, problems may occur, including:  Chest pain.  Shortness of breath.  Leg pain.  Irregular heartbeat. What happens before the procedure?  Follow instructions from your health care provider about eating or drinking restrictions.  Ask your health care provider about changing or stopping your regular medicines. This is especially important if you are taking diabetes medicines or blood thinners.  Wear loose, comfortable clothing and shoes.  If you use an inhaler, bring it with you to the test. What happens during the procedure?  A blood pressure cuff will be placed on your arm.  Several stick-on patches (electrodes) will be placed on your chest and attached to an electrocardiogram (EKG) machine.  A clip-on monitor that measures the amount of oxygen in your blood will be placed on your finger (pulse oximeter).  A clip will be placed on your nose and a mouthpiece will be placed in your mouth. This may be held in place with a headpiece. You will breathe through the mouthpiece during the procedure.  You will be asked to start exercising either on a stationary bicycle or on a  treadmill.  You will be closely supervised during exercise.  The amount of effort for your exercise will be gradually increased.  During exercise, the test will measure:  Your heart rate.  Your heart  rhythm.  Your oxygen blood level.  The amount of oxygen and carbon dioxide that you breathe out through your mouthpiece.  The test will end when:  You have finished the test.  You have reached your maximum ability to exercise.  You have chest or leg pain, dizziness, or shortness of breath. The procedure may vary among health care providers and hospitals. What happens after the procedure?  Your blood pressure and EKG will be checked to monitor your recovery from the test. This information is not intended to replace advice given to you by your health care provider. Make sure you discuss any questions you have with your health care provider. Document Released: 12/26/2008 Document Revised: 05/30/2015 Document Reviewed: 11/21/2014 Elsevier Interactive Patient Education  2017 [employer].

## 2016-04-09 ENCOUNTER — Institutional Professional Consult (permissible substitution): Payer: Medicare Other | Admitting: Pulmonary Disease

## 2016-04-16 ENCOUNTER — Telehealth: Payer: Self-pay | Admitting: *Deleted

## 2016-04-16 NOTE — Telephone Encounter (Signed)
Spoke with patient and advised her FU needs to be rescheduled. Dr is out of office. Advised she may see NP on another day Dr Marjory Lies is in office. She stated she has MRI 04/23/16. She did reschedule with Dr Marjory Lies in July on first available. She stated after receiving MRI results she may want to be seen sooner with NP. She verbalized understanding of call.

## 2016-04-17 ENCOUNTER — Ambulatory Visit (HOSPITAL_COMMUNITY): Payer: Medicare Other | Attending: Pulmonary Disease

## 2016-04-17 DIAGNOSIS — Z5181 Encounter for therapeutic drug level monitoring: Secondary | ICD-10-CM | POA: Diagnosis not present

## 2016-04-17 DIAGNOSIS — I5032 Chronic diastolic (congestive) heart failure: Secondary | ICD-10-CM | POA: Diagnosis not present

## 2016-04-17 DIAGNOSIS — G4733 Obstructive sleep apnea (adult) (pediatric): Secondary | ICD-10-CM

## 2016-04-17 DIAGNOSIS — Z79899 Other long term (current) drug therapy: Secondary | ICD-10-CM

## 2016-04-18 DIAGNOSIS — I5032 Chronic diastolic (congestive) heart failure: Secondary | ICD-10-CM | POA: Diagnosis not present

## 2016-04-23 ENCOUNTER — Ambulatory Visit
Admission: RE | Admit: 2016-04-23 | Discharge: 2016-04-23 | Disposition: A | Discharge status: Home / Self Care | Payer: Medicare Other | Source: Ambulatory Visit | Attending: Diagnostic Neuroimaging | Admitting: Diagnostic Neuroimaging

## 2016-04-23 ENCOUNTER — Ambulatory Visit: Payer: BC Managed Care – PPO | Admitting: Obstetrics & Gynecology

## 2016-04-23 DIAGNOSIS — R0602 Shortness of breath: Secondary | ICD-10-CM

## 2016-04-23 DIAGNOSIS — R251 Tremor, unspecified: Secondary | ICD-10-CM

## 2016-04-24 ENCOUNTER — Telehealth: Payer: Self-pay | Admitting: Cardiovascular Disease

## 2016-04-24 NOTE — Telephone Encounter (Signed)
Follow Up: ° ° ° °Returning your call, concerning her lab results. °

## 2016-04-24 NOTE — Telephone Encounter (Signed)
Reviewed results of CPX with patient who verbalized understanding. She was recently fitted for CPAP for mild sleep apnea because of her symptoms; she will trial it for 1 month. She states she gets most winded from talking. She states her voice sounds normal in the mornings but by the afternoon she has some notable difficulty speaking in complete sentences. She had an MRI of her head yesterday which was ordered by the neurologist for tremors. I advised her to keep [place] updated and to call back after wearing CPAP for the month to let us know if she would like to proceed with right heart cath. We discussed exercise and slowly increasing increments to strengthen heart muscle. [facility]. She verbalized understanding and agreement with plan of care and thanked me for the call.

## 2016-05-07 ENCOUNTER — Ambulatory Visit: Payer: Medicare Other | Admitting: Adult Health

## 2016-05-13 ENCOUNTER — Ambulatory Visit (INDEPENDENT_AMBULATORY_CARE_PROVIDER_SITE_OTHER): Payer: Medicare Other | Admitting: Adult Health

## 2016-05-13 ENCOUNTER — Encounter: Payer: Self-pay | Admitting: Adult Health

## 2016-05-13 VITALS — BP 116/74 | HR 62 | Ht 69.5 in | Wt 236.4 lb

## 2016-05-13 DIAGNOSIS — G4733 Obstructive sleep apnea (adult) (pediatric): Secondary | ICD-10-CM | POA: Diagnosis not present

## 2016-05-13 NOTE — Progress Notes (Signed)
@Patient  ID: [name], female    DOB: 10/25/1950, 66 y.o.   MRN: [id number]  Chief Complaint  Patient presents with  . Follow-up    OSA     Referring provider: Creola Corn, MD  HPI: 66 yo female never smoker followed for dyspnea and Mild OSA  She has a restrictive Cardiomyopathy (neg cardiac bx by [facility]) , A Fib on Tikosyn. Essential tremor following with Neuro (neg aldolase/ck/acetylcholine recept)   Significant tests/ events  PFTs 01/2016 showed ratio 72, FVC 74%, FEV1 69%, TLC 91% and DLCO 73% suggesting moderate intraparenchymal restriction  PSG 2008 at [facility] showed total sleep time 362 minutes and a low AHI  Echo 1/2018showed grade 2 diastolic dysfunction with RVSP 35  CT angiogram 09/2013 was negative for pulmonary emboli  PSG 02/2016 showed mild OSA with AHI 8/hour  02/2016 Sniff test nml .   01/2016 Stress myoview, low risk    05/13/2016 Follow up ; OSA Patient returns for a 6 week follow-up for mild sleep apnea. Patient was recently found to have mild sleep apnea on sleep study on February 2018 that showed an AHI at 8/hr. she was started on C Pap. She is wearing it every night. She is having mask issues. It is causing soreness in cheek/nose. [name]  Unsure if it is helping or not but does feel voice is some better.  Download shows excellent compliance with average usage at 6.5 hours. Patient is on AutoSet 5-10 cm H2O. AHI 2.1. Minimum leaks.      Allergies  Allergen Reactions  . Topamax [Topiramate] Other (See Comments)    hallucinations   . Codeine     VERTIGO, NAUSEA & VOMITTING  . Oxycodone     Hives   . Penicillins Rash    Immunization History  Administered Date(s) Administered  . Influenza Split 10/22/2015  . Pneumococcal Polysaccharide-23 01/22/2015    Past Medical History:  Diagnosis Date  . Back pain    nerve problems-had series of three injections  . Chronic anticoagulation   . Diastolic congestive heart failure (HCC)   .  Dyspnea   . Hyperlipemia   . Migraine headache   . Paroxysmal atrial fibrillation (HCC)   . Restrictive cardiomyopathy (HCC)   . Tubal pregnancy     Tobacco History: History  Smoking Status  . Never Smoker  Smokeless Tobacco  . Never Used   Counseling given: Not Answered   Outpatient Encounter Prescriptions as of 05/13/2016  Medication Sig  . dofetilide (TIKOSYN) 125 MCG capsule Take 3 capsules (375 mcg total) by mouth 2 (two) times daily.  . furosemide (LASIX) 40 MG tablet TAKE 1 TABLET BY MOUTH TWO  TIMES DAILY  . gabapentin (NEURONTIN) 600 MG tablet Take 900 mg by mouth at bedtime.   . lidocaine (XYLOCAINE) 2 % solution Use as directed 20 mLs in the mouth or throat daily as needed for mouth pain. Headache  . metoCLOPramide (REGLAN) 10 MG tablet Take 10 mg by mouth 4 (four) times daily.  . Multiple Vitamins-Minerals (ICAPS AREDS 2) CAPS Take 1 capsule by mouth 2 (two) times daily.  . Multiple Vitamins-Minerals (VISION FORMULA PO) Take 1 tablet by mouth daily.   . potassium chloride SA (K-DUR,KLOR-CON) 20 MEQ tablet TAKE 1 TABLET BY MOUTH TWO  TIMES DAILY  . spironolactone (ALDACTONE) 25 MG tablet TAKE 1 TABLET BY MOUTH  DAILY  . XARELTO 20 MG TABS tablet TAKE 1 TABLET BY MOUTH  DAILY WITH SUPPER   No  facility-administered encounter medications on file as of 05/13/2016.      Review of Systems  Constitutional:   No  weight loss, night sweats,  Fevers, chills, fatigue, or  lassitude.  HEENT:   No headaches,  Difficulty swallowing,  Tooth/dental problems, or  Sore throat,                No sneezing, itching, ear ache, nasal congestion, post nasal drip,   CV:  No chest pain,  Orthopnea, PND, swelling in lower extremities, anasarca, dizziness, palpitations, syncope.   GI  No heartburn, indigestion, abdominal pain, nausea, vomiting, diarrhea, change in bowel habits, loss of appetite, bloody stools.   Resp: No shortness of breath with exertion or at rest.  No excess mucus, no  productive cough,  No non-productive cough,  No coughing up of blood.  No change in color of mucus.  No wheezing.  No chest wall deformity  Skin: no rash or lesions.  GU: no dysuria, change in color of urine, no urgency or frequency.  No flank pain, no hematuria   MS:  No joint pain or swelling.  No decreased range of motion.  No back pain.    Physical Exam  BP 116/74 (BP Location: Left Arm, Cuff Size: Normal)   Pulse 62   Ht 5' 9.5" (1.765 m)   Wt 236 lb 6.4 oz (107.2 kg)   LMP 01/21/1990   SpO2 97%   BMI 34.41 kg/m   GEN: A/Ox3; pleasant , NAD, obese    HEENT:  [place]/AT,  EACs-clear, TMs-wnl, NOSE-clear, THROAT-clear, no lesions, no postnasal drip or exudate noted. Class 2-3 MP airway   NECK:  Supple w/ fair ROM; no JVD; normal carotid impulses w/o bruits; no thyromegaly or nodules palpated; no lymphadenopathy.    RESP  Clear  P & A; w/o, wheezes/ rales/ or rhonchi. no accessory muscle use, no dullness to percussion  CARD:  RRR, no m/r/g, no peripheral edema, pulses intact, no cyanosis or clubbing.  GI:   Soft & nt; nml bowel sounds; no organomegaly or masses detected.   Musco: Warm bil, no deformities or joint swelling noted.   Neuro: alert, no focal deficits noted.    Skin: Warm, no lesions or rashes       Assessment & Plan:   OSA (obstructive sleep apnea) Mild OSA controlled on CPAP  Change to nasal mask   Plan  Patient Instructions  Continue on C Pap at bedtime. Change to nasal pillows with chin strap Use saline nasal spray and saline nasal gel as needed Remain active and work on weight loss Do not drive if sleepy Follow with Dr. Vassie Loll in 3 months and as needed    Morbid obesity (HCC) Wt loss      Rubye Oaks, NP 05/13/2016

## 2016-05-13 NOTE — Addendum Note (Signed)
Addended by: Boone Master E on: 05/13/2016 11:28 AM   Modules accepted: Orders

## 2016-05-13 NOTE — Assessment & Plan Note (Signed)
Wt loss  

## 2016-05-13 NOTE — Assessment & Plan Note (Signed)
Mild OSA controlled on CPAP  Change to nasal mask   Plan  Patient Instructions  Continue on C Pap at bedtime. Change to nasal pillows with chin strap Use saline nasal spray and saline nasal gel as needed Remain active and work on weight loss Do not drive if sleepy Follow with Dr. Vassie Loll in 3 months and as needed

## 2016-05-13 NOTE — Patient Instructions (Signed)
Continue on C Pap at bedtime. Change to nasal pillows with chin strap Use saline nasal spray and saline nasal gel as needed Remain active and work on weight loss Do not drive if sleepy Follow with Dr. Vassie Loll in 3 months and as needed

## 2016-05-14 ENCOUNTER — Other Ambulatory Visit: Payer: Self-pay | Admitting: Cardiovascular Disease

## 2016-05-15 ENCOUNTER — Telehealth: Payer: Self-pay | Admitting: *Deleted

## 2016-05-15 NOTE — Telephone Encounter (Signed)
Per Dr Marjory Lies, spoke with patient and informed her that her MRI brain results are unremarkable, no major concerns. Advised there is possible left maxillary sinusitis, and if she is having symptoms, she may follow up with her PCP.  Advised Dr Marjory Lies will continue with her current treatment plan. Patient stated the cardiologist was considering a cardiac catherization. She verbalized understanding, had no questions.

## 2016-05-20 NOTE — Progress Notes (Signed)
Reviewed & agree with plan  

## 2016-06-03 ENCOUNTER — Ambulatory Visit: Payer: Medicare Other | Admitting: Diagnostic Neuroimaging

## 2016-06-19 ENCOUNTER — Encounter: Payer: Self-pay | Admitting: *Deleted

## 2016-07-10 ENCOUNTER — Encounter: Payer: Self-pay | Admitting: Cardiovascular Disease

## 2016-07-10 ENCOUNTER — Other Ambulatory Visit: Payer: Self-pay

## 2016-07-10 ENCOUNTER — Ambulatory Visit (INDEPENDENT_AMBULATORY_CARE_PROVIDER_SITE_OTHER): Payer: Medicare Other | Admitting: Cardiovascular Disease

## 2016-07-10 VITALS — BP 122/84 | HR 64 | Ht 69.0 in | Wt 239.0 lb

## 2016-07-10 DIAGNOSIS — I5032 Chronic diastolic (congestive) heart failure: Secondary | ICD-10-CM

## 2016-07-10 DIAGNOSIS — Z7901 Long term (current) use of anticoagulants: Secondary | ICD-10-CM

## 2016-07-10 DIAGNOSIS — I480 Paroxysmal atrial fibrillation: Secondary | ICD-10-CM

## 2016-07-10 LAB — COMPREHENSIVE METABOLIC PANEL
A/G RATIO: 1.5 (ref 1.2–2.2)
ALT: 12 IU/L (ref 0–32)
AST: 18 IU/L (ref 0–40)
Albumin: 4.3 g/dL (ref 3.6–4.8)
Alkaline Phosphatase: 107 IU/L (ref 39–117)
BILIRUBIN TOTAL: 0.3 mg/dL (ref 0.0–1.2)
BUN/Creatinine Ratio: 21 (ref 12–28)
BUN: 20 mg/dL (ref 8–27)
CHLORIDE: 100 mmol/L (ref 96–106)
CO2: 25 mmol/L (ref 20–29)
Calcium: 9.7 mg/dL (ref 8.7–10.3)
Creatinine, Ser: 0.95 mg/dL (ref 0.57–1.00)
GFR calc non Af Amer: 63 mL/min/{1.73_m2} (ref >59)
GFR, EST AFRICAN AMERICAN: 72 mL/min/{1.73_m2} (ref >59)
Globulin, Total: 2.9 g/dL (ref 1.5–4.5)
Glucose: 89 mg/dL (ref 65–99)
POTASSIUM: 4.7 mmol/L (ref 3.5–5.2)
Sodium: 141 mmol/L (ref 134–144)
TOTAL PROTEIN: 7.2 g/dL (ref 6.0–8.5)

## 2016-07-10 LAB — CBC WITH DIFFERENTIAL/PLATELET
BASOS: 0 %
Basophils Absolute: 0 10*3/uL (ref 0.0–0.2)
EOS (ABSOLUTE): 0.1 10*3/uL (ref 0.0–0.4)
Eos: 2 %
Hematocrit: 40.1 % (ref 34.0–46.6)
Hemoglobin: 13.8 g/dL (ref 11.1–15.9)
Immature Grans (Abs): 0 10*3/uL (ref 0.0–0.1)
Immature Granulocytes: 0 %
Lymphocytes Absolute: 1.8 10*3/uL (ref 0.7–3.1)
Lymphs: 22 %
MCH: 28.9 pg (ref 26.6–33.0)
MCHC: 34.4 g/dL (ref 31.5–35.7)
MCV: 84 fL (ref 79–97)
MONOS ABS: 0.5 10*3/uL (ref 0.1–0.9)
Monocytes: 6 %
NEUTROS ABS: 5.6 10*3/uL (ref 1.4–7.0)
Neutrophils: 70 %
PLATELETS: 273 10*3/uL (ref 150–379)
RBC: 4.77 x10E6/uL (ref 3.77–5.28)
RDW: 14.3 % (ref 12.3–15.4)
WBC: 8.1 10*3/uL (ref 3.4–10.8)

## 2016-07-10 LAB — LIPID PANEL
Chol/HDL Ratio: 5.8 ratio — ABNORMAL HIGH (ref 0.0–4.4)
Cholesterol, Total: 244 mg/dL — ABNORMAL HIGH (ref 100–199)
HDL: 42 mg/dL (ref >39)
LDL Calculated: 168 mg/dL — ABNORMAL HIGH (ref 0–99)
Triglycerides: 168 mg/dL — ABNORMAL HIGH (ref 0–149)
VLDL Cholesterol Cal: 34 mg/dL (ref 5–40)

## 2016-07-10 NOTE — Progress Notes (Signed)
Cardiology Office Note   Date:  07/10/2016   ID:  [name], [name] 01-Feb-1950, MRN [id number]  PCP:  [name] Corn, MD  Cardiologist:   [name] Miss, MD   Chief Complaint  Patient presents with  . Follow-up    CHF   1. Atrial fibrillation 2. Hyperlipidemia 3. Diastolic congestive heart failure    66 year old female with a history of atrial fibrillation. She's been well controlled on Tikosyn. She also has a history of hyperlipidemia and is on Lipitor. She's been on chronic Pradaxa therapy for anticoagulation.  February 20, 2012 [name] has done well since I last saw her. 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 She has occasional palpitations but overall she's doing quite well. She is exercising ( water aerobics) twice a week.  Feb. 24, 2015:  [name] is doing well. No problems. Maintaining NSR.  Sept. 16, 2015:  She was in the ER this past week. CP, dyspnea .. CT angio was negative for PE - showed ? Of pneumonia. Was treated wit Abx. Does well in the am's .[name] and perhaps more short of breath in the afternoons.   April 05, 2014: [name] is a 66 y.o. female who presents for follow-up of her atrial fibrillation. She has continued to have some DOE,  Is concerned about the atorvastatin. Was seen in the lipid clinic.    Sept. 30, 2016:  Doing well.  No CP , no dyspnea. Has lost 11 lbs since last year.  Is in NSR  April 10, 2015:  [name] is doing ok from a cardiology standpoint. Has had some dental issues   Sept.  22, 2017:  [name] is seen for follow up of her atrial fib.    No  CP or dyspnea. No further episodes of atrial fib.   Dec. 11, 2017:  [name] is seen today for followup for her atrial fib  Has some DOE  Not exercising as much as she needs to  No CP   Jan. 16, 2018: [name] is seen today as a work in visit at the request of Dr. Timothy [name]. Been having more shortness of breath and was thought to have  worsening congestive heart failure.  Has severe DOE with any exertion. We performed a cardiac cath in 2008 which revealed normal coronaries.   She went to [facility] and had a heart biopsy.   Was not told of any abnormality.  Does have dull chest pain with exertion and with sitting   She had an echocardiogram performed yesterday which revealed normal left ventricular systolic function. She does have grade 2 diastolic dysfunction. Her Lasix was increased last week.   She has not noticed any increase in urine output.  No change in her breathing .  Has noticed that she loses her breath if she does any exertion .  Cannot lay supine - has severe dyspnea.   April 02, 2016:  [name] is seen today for follow up visit .    Seen with husband , [name]  She had an episode of PAF yesterday  - lasted for several hours.   Is back in NSR currently .  Had an episode in Feb. That lasted 2-3 hours , HR of 129 ,  She was very short of breath with that.   -Sleep study on Feb. 20 shows mild OSA -PFTs 01/2016 showed ratio 72, FVC 74%, FEV1 69%, TLC 91% and DLCO 73% suggesting moderate intraparenchymal restriction  - echo shows  normal LV systolic function with grade 2 diastolic dysfunction. She has an estimated PA pressure of 35.  Has developed a tremor.   Is seeing a neurologist .   July 10, 2016:  Had a CPX in March , Normal functional capacity compared to sedentary patients.  Still has some vocal issues ( when she gets fatigued, she loses her voice) struggles to talk  Able to get out and work in the garden Using CPAP - may have helped some    Wt Readings from Last 3 Encounters:  07/10/16 239 lb 6.4 oz (108.6 kg)  05/13/16 236 lb 6.4 oz (107.2 kg)  04/02/16 233 lb 1.9 oz (105.7 kg)      Past Medical History:  Diagnosis Date  . Back pain    nerve problems-had series of three injections  . Chronic anticoagulation   . Diastolic congestive heart failure (HCC)   . Dyspnea   . Hyperlipemia   .  Migraine headache   . Paroxysmal atrial fibrillation (HCC)   . Restrictive cardiomyopathy (HCC)   . Tubal pregnancy     Past Surgical History:  Procedure Laterality Date  . BREAST LUMPECTOMY     benign  . CARDIAC CATHETERIZATION    . CARDIOVERSION    . CHOLECYSTECTOMY    . ECTOPIC PREGNANCY SURGERY  1980  . EYE SURGERY     left eye injections x2-was having "stroke like" flashes of light  . heart biopsy  2008   [facility]  . HYSTEROSCOPY  6/05   and D&C-simple hyperplasia secondary PMP bleeding  . ROTATOR CUFF REPAIR     bilateral     Current Outpatient Prescriptions  Medication Sig Dispense Refill  . dofetilide (TIKOSYN) 125 MCG capsule TAKE 3 CAPSULES BY MOUTH  TWO TIMES DAILY 540 capsule 3  . furosemide (LASIX) 40 MG tablet TAKE 1 TABLET BY MOUTH TWO  TIMES DAILY 180 tablet 3  . gabapentin (NEURONTIN) 600 MG tablet Take 900 mg by mouth at bedtime.     . lidocaine (XYLOCAINE) 2 % solution Use as directed 20 mLs in the mouth or throat daily as needed for mouth pain. Headache    . metoCLOPramide (REGLAN) 10 MG tablet Take 10 mg by mouth 4 (four) times daily.    . Multiple Vitamins-Minerals (ICAPS AREDS 2) CAPS Take 1 capsule by mouth 2 (two) times daily.    . Multiple Vitamins-Minerals (VISION FORMULA PO) Take 1 tablet by mouth daily.     . potassium chloride SA (K-DUR,KLOR-CON) 20 MEQ tablet TAKE 1 TABLET BY MOUTH TWO  TIMES DAILY 180 tablet 3  . rivaroxaban (XARELTO) 20 MG TABS tablet Take 20 mg by mouth daily with breakfast.    . spironolactone (ALDACTONE) 25 MG tablet TAKE 1 TABLET BY MOUTH  DAILY 90 tablet 3   No current facility-administered medications for this visit.     Allergies:   Topamax [topiramate]; Codeine; Oxycodone; and Penicillins    Social History:  The patient  reports that she has never smoked. She has never used smokeless tobacco. She reports that she drinks alcohol. She reports that she does not use drugs.   Family History:  [name] family history  includes Atrial fibrillation in her mother; Breast cancer in her sister; Cancer in her maternal grandmother and maternal uncle; Heart disease in her sister; Hypertension in her father; Thyroid cancer in her sister.    ROS:  Please see the history of present illness.    Review of Systems: Constitutional:  denies  fever, chills, diaphoresis, appetite change and fatigue.  HEENT: denies photophobia, eye pain, redness, hearing loss, ear pain, congestion, sore throat, rhinorrhea, sneezing, neck pain, neck stiffness and tinnitus.  Respiratory: admits to SOB, DOE, cough, chest tightness, and wheezing.  Cardiovascular: denies chest pain, palpitations and leg swelling.  Gastrointestinal: denies nausea, vomiting, abdominal pain, diarrhea, constipation, blood in stool.  Genitourinary: denies dysuria, urgency, frequency, hematuria, flank pain and difficulty urinating.  Musculoskeletal: denies  myalgias, back pain, joint swelling, arthralgias and gait problem.   Skin: denies pallor, rash and wound.  Neurological: denies dizziness, seizures, syncope, weakness, light-headedness, numbness and headaches.   Hematological: denies adenopathy, easy bruising, personal or family bleeding history.  Psychiatric/ Behavioral: denies suicidal ideation, mood changes, confusion, nervousness, sleep disturbance and agitation.       All other systems are reviewed and negative.    PHYSICAL EXAM: VS:  BP 122/84   Pulse 64   Ht 5' 9.5" (1.765 m)   Wt 239 lb 6.4 oz (108.6 kg)   LMP 01/21/1990   SpO2 96%   BMI 34.85 kg/m  , BMI Body mass index is 34.85 kg/m. GEN: Well nourished, well developed, in no acute distress  HEENT: normal  Neck: no JVD, carotid bruits, or masses Cardiac: RRR; no murmurs, rubs, or gallops,no edema  Respiratory:  clear to auscultation bilaterally, normal work of breathing GI: soft, nontender, nondistended, + BS MS: no deformity or atrophy  Skin: warm and dry, no rash Neuro:  Strength and  sensation are intact Psych: normal   EKG:  EKG is ordered today. The ekg ordered today demonstrates sinus brady at 56.   QTc is 432 ms.    Recent Labs: 01/01/2016: BUN 16; Creat 0.95; Magnesium 2.2; Potassium 4.4; Sodium 138 03/25/2016: TSH 3.830    Lipid Panel    Component Value Date/Time   CHOL 161 10/22/2013 1501   TRIG 163.0 (H) 10/22/2013 1501   HDL 41.60 10/22/2013 1501   CHOLHDL 4 10/22/2013 1501   VLDL 32.6 10/22/2013 1501   LDLCALC 87 10/22/2013 1501      Wt Readings from Last 3 Encounters:  07/10/16 239 lb 6.4 oz (108.6 kg)  05/13/16 236 lb 6.4 oz (107.2 kg)  04/02/16 233 lb 1.9 oz (105.7 kg)    ECG was ordered today  April 02, 2016:   NSR at 100.   QTc of 420.   Normal   Other studies Reviewed: Additional studies/ records that were reviewed today include: . Review of the above records demonstrates:    ASSESSMENT AND PLAN:  1. Atrial fibrillation - she is currently maintaining normal sinus rhythm. She's currently on Tikosyn and Xarelto .  Check CBC today    2. Hyperlipidemia- managed by her primary MD   3. Diastolic congestive heart failure - she has done well with the lasix and aldactone.     Seems to be making progress. BMP today   Will see her in 6 months    4. Worsening shortness of breath. Check presents today with worsening shortness of breath. Her echocardiogram reveals normal left ventricle systolic function. She does have grade 2 diastolic dysfunction which is in actual improvement from her previous echocardiogram.   She loses her breath if she exercises too much. She has some dull chest pain but had   normal coronaries  in 2008. Normal cardiac biopsy in 2008 at [facility]    We discussed cardiac MRI .   Will hold off for now    Current medicines  are reviewed at length with the patient today.  The patient does not have concerns regarding medicines.  The following changes have been made:  See above.   Labs/ tests ordered today include:    No orders of the defined types were placed in this encounter.   Disposition:   FU with me in 6  months for OV,    [name] Miss, MD  07/10/2016 8:20 AM    [facility] [address] [place], [place], [place]  [address] Phone: [phone]; Fax: [phone]

## 2016-07-10 NOTE — Patient Instructions (Addendum)
Medication Instructions:  Your physician recommends that you continue on your current medications as directed. Please refer to the Current Medication list given to you today.   Labwork: TODAY - CBC, cholesterol, complete metabolic panel   Testing/Procedures: None Ordered   Follow-Up: Your physician wants you to follow-up in: 6 months with Dr. Elease Hashimoto.  You will receive a reminder letter in the mail two months in advance. If you don't receive a letter, please call our office to schedule the follow-up appointment.   If you need a refill on your cardiac medications before your next appointment, please call your pharmacy.   Thank you for choosing [facility]! Eligha Bridegroom, RN [phone]

## 2016-07-11 ENCOUNTER — Telehealth: Payer: Self-pay | Admitting: Cardiovascular Disease

## 2016-07-11 NOTE — Telephone Encounter (Signed)
New message      Calling to let the nurse know that pt was on lipitor 2010-2011 and atorvastatin 2013-2015.  She was able to tolerate these medications at that time.  Also, pt want to report that when she saw Dr Russo jan 2018, her LVL was 207.  If Dr Elease Hashimoto want her to have a presc, please delay the presc until the first week in July.  Then, call it in to [facility] on [address].

## 2016-07-22 ENCOUNTER — Other Ambulatory Visit: Payer: Self-pay | Admitting: Cardiovascular Disease

## 2016-07-22 MED ORDER — EZETIMIBE 10 MG PO TABS: 10.0000 mg | ORAL_TABLET | Freq: Every day | ORAL | 3 refills | Status: DC

## 2016-07-22 NOTE — Telephone Encounter (Signed)
Rx for Zetia 10 mg sent to patient's local pharmacy per her request. Will follow-up in 3 months with lab work.

## 2016-07-22 NOTE — Telephone Encounter (Signed)
Request received for Xarelto 20mg ; pt is 66 yrs old, wt-108.4kg, Crea-0.95 on 07/10/16, CrCl- 99.75ml/min & pt last seen by Dr. Elease Hashimoto on 07/10/16.

## 2016-07-29 ENCOUNTER — Encounter: Payer: Self-pay | Admitting: Diagnostic Neuroimaging

## 2016-07-29 ENCOUNTER — Ambulatory Visit (INDEPENDENT_AMBULATORY_CARE_PROVIDER_SITE_OTHER): Payer: Medicare Other | Admitting: Diagnostic Neuroimaging

## 2016-07-29 VITALS — BP 123/74 | HR 60 | Ht 69.5 in | Wt 244.0 lb

## 2016-07-29 DIAGNOSIS — R0602 Shortness of breath: Secondary | ICD-10-CM | POA: Diagnosis not present

## 2016-07-29 DIAGNOSIS — G250 Essential tremor: Secondary | ICD-10-CM

## 2016-07-29 NOTE — Patient Instructions (Signed)
-   continue improving exercise tolerance by gradually increase activity  - regarding tremor control, could consider beta-blocker (propranolol or metoprolol); ok from cardiology and pulmonary to initiate; will hold off for now at patient's request, but may consider in future

## 2016-07-29 NOTE — Progress Notes (Signed)
[facility]  PATIENT: [name] DOB: 1950/06/28  REFERRING CLINICIAN: Jessee Avers HISTORY FROM: patient  REASON FOR VISIT: follow up    HISTORICAL  CHIEF COMPLAINT:  Chief Complaint  Patient presents with  . Follow-up  . Tremors    same    HISTORY OF PRESENT ILLNESS:   UPDATE 07/29/16: Since last visit, doing a little better. Now working in garden and around the home, and feeling better. Walking short distances at home (5-10 minutes). Tremor stable.   PRIOR HPI (03/25/16): 66 year old female here for evaluation of shortness of breath and dyspnea on exertion. Since January 2018 patient has had intermittent problems with shortness of breath and dyspnea on exertion, and has had thorough evaluation by pulmonary and cardiology physicians, without specific cause found. She does have history of atrial fibrillation, atrial flutter, congestive heart failure, but these do not appear to be explain patient's dyspnea symptoms adequately, and therefore possibility of an occult neuromuscular disease has been raised and therefore patient presenting to me for further evaluation. Patient requested for me to see her for this problem due to her meeting me while I was treating one of her relatives.  Patient reports a variety of constitutional symptoms include chest pain, ringing in ears spinning sensation shortness of breath snoring tremor. Patient can feel shortness of breath when she exerts herself as well as at rest. Sometimes she thinks about a problem her symptoms can get worse. She denies any muscle twitching, muscle weakness focally.   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 Multiple family members on her father's side including father, sister, nephew and others have history of tremor. Some of them have tremor in chin, head and some in the hands. Patient has been tried on primidone in the past without relief and  some aggravation of symptoms.    REVIEW OF SYSTEMS: Full 14 system review of systems performed and negative with exception of: hearing loss.   ALLERGIES: Allergies  Allergen Reactions  . Topamax [Topiramate] Other (See Comments)    hallucinations   . Codeine Nausea And Vomiting    VERTIGO  . Oxycodone     Hives   . Penicillins Rash    HOME MEDICATIONS: Outpatient Medications Prior to Visit  Medication Sig Dispense Refill  . dofetilide (TIKOSYN) 125 MCG capsule TAKE 3 CAPSULES BY MOUTH  TWO TIMES DAILY 540 capsule 3  . ezetimibe (ZETIA) 10 MG tablet Take 1 tablet (10 mg total) by mouth daily. 90 tablet 3  . furosemide (LASIX) 40 MG tablet TAKE 1 TABLET BY MOUTH TWO  TIMES DAILY 180 tablet 3  . gabapentin (NEURONTIN) 600 MG tablet Take 900 mg by mouth at bedtime.     . lidocaine (XYLOCAINE) 2 % solution Use as directed 20 mLs in the mouth or throat daily as needed for mouth pain. Headache    . metoCLOPramide (REGLAN) 10 MG tablet Take 10 mg by mouth 4 (four) times daily.    . Multiple Vitamins-Minerals (ICAPS AREDS 2) CAPS Take 1 capsule by mouth 2 (two) times daily.    . potassium chloride SA (K-DUR,KLOR-CON) 20 MEQ tablet TAKE 1 TABLET BY MOUTH TWO  TIMES DAILY 180 tablet 3  . spironolactone (ALDACTONE) 25 MG tablet TAKE 1 TABLET BY MOUTH  DAILY 90 tablet 3  . XARELTO 20 MG TABS tablet TAKE 1 TABLET BY MOUTH  DAILY WITH SUPPER (Patient taking differently: TAKE 1 TABLET BY MOUTH  DAILY WITH BREAKFAST) 90 tablet 3  . Multiple Vitamins-Minerals (VISION FORMULA PO) Take 1 tablet by mouth daily.      No facility-administered medications prior to visit.     PAST MEDICAL HISTORY: Past Medical History:  Diagnosis Date  . Back pain    nerve problems-had series of three injections  . Chronic anticoagulation   . Diastolic congestive heart failure (HCC)   . Dyspnea   . Hyperlipemia   . Migraine headache   . Paroxysmal atrial fibrillation (HCC)   . Restrictive cardiomyopathy (HCC)     . Tubal pregnancy     PAST SURGICAL HISTORY: Past Surgical History:  Procedure Laterality Date  . BREAST LUMPECTOMY     benign  . CARDIAC CATHETERIZATION    . CARDIOVERSION    . CHOLECYSTECTOMY    . ECTOPIC PREGNANCY SURGERY  1980  . EYE SURGERY     left eye injections x2-was having "stroke like" flashes of light  . heart biopsy  2008   [facility]  . HYSTEROSCOPY  6/05   and D&C-simple hyperplasia secondary PMP bleeding  . ROTATOR CUFF REPAIR     bilateral    FAMILY HISTORY: Family History  Problem Relation Age of Onset  . Hypertension Father   . Atrial fibrillation Mother   . Cancer Maternal Grandmother        mouth  . Cancer Maternal Uncle        unknown type  . Thyroid cancer Sister   . Breast cancer Sister        breast cancer  . Heart disease Sister     SOCIAL HISTORY:  Social History   Social History  . Marital status: Married    Spouse name: [name]  . Number of children: 0  . Years of education: college   Occupational History  .      retired   Social History Main Topics  . Smoking status: Never Smoker  . Smokeless tobacco: Never Used  . Alcohol use 0.0 oz/week     Comment: rarely  . Drug use: No  . Sexual activity: Yes   Other Topics Concern  . Not on file   Social History Narrative   Patient lives at home with her husband [name])  and she is retired. College education.   Patient is retired.   Caffeine- 2-3 cups tea daily .     PHYSICAL EXAM  GENERAL EXAM/CONSTITUTIONAL: Vitals:  Vitals:   07/29/16 0841  Weight: 244 lb (110.7 kg)  Height: 5' 9.5" (1.765 m)   Wt Readings from Last 3 Encounters:  07/29/16 244 lb (110.7 kg)  07/10/16 239 lb (108.4 kg)  05/13/16 236 lb 6.4 oz (107.2 kg)   Body mass index is 35.52 kg/m. No exam data present  Patient is in no distress; well developed, nourished and groomed; neck is supple  CARDIOVASCULAR:  Examination of carotid arteries is normal; no carotid bruits  Regular rate and rhythm, no  murmurs  Examination of peripheral vascular system by observation and palpation is normal  EYES:  Ophthalmoscopic exam of optic discs and posterior segments is normal; no papilledema or hemorrhages  MUSCULOSKELETAL:  Gait, strength, tone, movements noted in Neurologic exam below  NEUROLOGIC: MENTAL STATUS:  No flowsheet data found.  awake, alert, oriented to person, place and time  recent and remote memory intact  normal attention and concentration  language fluent, comprehension intact, naming intact,   fund of knowledge appropriate  CRANIAL NERVE:   2nd - no papilledema  on fundoscopic exam  2nd, 3rd, 4th, 6th - pupils equal and reactive to light, visual fields full to confrontation, extraocular muscles intact, no nystagmus  5th - facial sensation symmetric  7th - facial strength symmetric  8th - hearing intact  9th - palate elevates symmetrically, uvula midline  11th - shoulder shrug symmetric  12th - tongue protrusion midline  MILD VOICE AND HEAD TREMOR  MOTOR:   normal bulk and tone, full strength in the BUE, BLE  POSTURAL TREMOR IN BUE  NO BRADYKINESIA  SENSORY:   normal and symmetric to light touch, temperature, vibration  COORDINATION:   finger-nose-finger, fine finger movements --> ACTION TREMOR (LEFT WORSE THAN RIGHT)  REFLEXES:   deep tendon reflexes present and symmetric  TRACE AT ANKLES  GAIT/STATION:   narrow based gait    DIAGNOSTIC DATA (LABS, IMAGING, TESTING) - I reviewed patient records, labs, notes, testing and imaging myself where available.  Lab Results  Component Value Date   WBC 8.1 07/10/2016   HGB 13.8 07/10/2016   HCT 40.1 07/10/2016   MCV 84 07/10/2016   PLT 273 07/10/2016      Component Value Date/Time   NA 141 07/10/2016 0854   K 4.7 07/10/2016 0854   CL 100 07/10/2016 0854   CO2 25 07/10/2016 0854   GLUCOSE 89 07/10/2016 0854   GLUCOSE 87 01/01/2016 0839   BUN 20 07/10/2016 0854   CREATININE  0.95 07/10/2016 0854   CREATININE 0.95 01/01/2016 0839   CALCIUM 9.7 07/10/2016 0854   PROT 7.2 07/10/2016 0854   ALBUMIN 4.3 07/10/2016 0854   AST 18 07/10/2016 0854   ALT 12 07/10/2016 0854   ALKPHOS 107 07/10/2016 0854   BILITOT 0.3 07/10/2016 0854   GFRNONAA 63 07/10/2016 0854   GFRAA 72 07/10/2016 0854   Lab Results  Component Value Date   CHOL 244 (H) 07/10/2016   HDL 42 07/10/2016   LDLCALC 168 (H) 07/10/2016   TRIG 168 (H) 07/10/2016   CHOLHDL 5.8 (H) 07/10/2016   Lab Results  Component Value Date   HGBA1C 5.8 (H) 03/25/2016   No results found for: VITAMINB12 Lab Results  Component Value Date   TSH 3.830 03/25/2016    03/05/16 Dg Sniff chest - Normal diaphragmatic function.  06/12/12 MRI lumbar [I reviewed images myself and agree with interpretation. -VRP]  - No distinct cause of left hip pain is identified. - Curvature convex to the right with the apex at L2-3. - Shallow central disc herniation at L3-4 that contacts the thecal sac but does not appear to cause neural compression.  Mild facet arthropathy at this level that could conceivably be symptomatic. - L4-5:  Bulging of the disc.  Bilateral facet degeneration.  No apparent compressive stenosis.  The facet disease could possibly be symptomatic. - L5 S1:  Right foraminal to extraforaminal osteophyte and bulging disc could possibly irritate the right L5 nerve root.  Definite neural compression is not demonstrated however.  04/18/16 exercise testing - Exercise testing with gas exchange demonstrates normal functional capacity when compared to matched sedentary norms. There is no clear indication for cardiopulmonary limitation. At peak exercise and with flat O2 pulse pattern, patient is likely limited due to diastolic dysfunction as a result of her body habitus. Expect weight loss and regular exercise would greatly improve exercise intolerance.  04/23/16 MRI brain (with and without)  1.   The brain parenchyma appears  normal for age 4.   There is an air fluid level within the  left maxillary sinus consistent with acute sinusitis 3.   No acute findings.  03/25/16 LABS - CK, aldolase, AchR, TSH --> all normal    ASSESSMENT AND PLAN  66 y.o. year old female here with here with new onset dyspnea on exertion, shortness of breath, fatigue, since January 2018. No evidence of underlying neuromuscular disorder.  Also with history of essential tremor.  Ddx: shortness of breath / fatigue (due to mild deconditioning and obesity) + ESSENTIAL TREMOR  1. Essential tremor   2. Shortness of breath      PLAN:  I spent 25 minutes of face to face time with patient. Greater than 50% of time was spent in counseling and coordination of care with patient. In summary we discussed:   - continue improving exercise tolerance by gradually increase activity  - regarding tremor control, could consider beta-blocker (propranolol or metoprolol); ok from cardiology and pulmonary to initiate; will hold off for now at patient's request, but may consider in future  Return if symptoms worsen or fail to improve, for return to PCP.    Suanne Marker, MD 07/29/2016, 9:09 AM Certified in Neurology, Neurophysiology and Neuroimaging  [facility] [address], [address] [place], [place] [address] [phone]

## 2016-08-02 ENCOUNTER — Other Ambulatory Visit: Payer: Self-pay | Admitting: Obstetrics & Gynecology

## 2016-08-02 DIAGNOSIS — Z1231 Encounter for screening mammogram for malignant neoplasm of breast: Secondary | ICD-10-CM

## 2016-08-15 ENCOUNTER — Encounter: Payer: Self-pay | Admitting: Pulmonary Disease

## 2016-08-19 ENCOUNTER — Encounter: Payer: Self-pay | Admitting: Pulmonary Disease

## 2016-08-19 ENCOUNTER — Ambulatory Visit (INDEPENDENT_AMBULATORY_CARE_PROVIDER_SITE_OTHER): Payer: Medicare Other | Admitting: Pulmonary Disease

## 2016-08-19 DIAGNOSIS — R0602 Shortness of breath: Secondary | ICD-10-CM | POA: Diagnosis not present

## 2016-08-19 DIAGNOSIS — G4733 Obstructive sleep apnea (adult) (pediatric): Secondary | ICD-10-CM | POA: Diagnosis not present

## 2016-08-19 NOTE — Assessment & Plan Note (Signed)
No clear etiology identified but she is improving

## 2016-08-19 NOTE — Patient Instructions (Signed)
Call [phone] and make appointment for a mask fitting session Based on this, we will write you a prescription for the right kind of mask.  CPAP is working well. CPAP supplies will be renewed for a year

## 2016-08-19 NOTE — Addendum Note (Signed)
Addended by: Maurene Capes on: 08/19/2016 10:47 AM   Modules accepted: Orders

## 2016-08-19 NOTE — Progress Notes (Signed)
   Subjective:    Patient ID: [name], female    DOB: Feb 08, 1950, 66 y.o.   MRN: [id number]  HPI  66 year old never smoker, retired Psychiatrist for FU of dyspnea and Mild restriction on PFTs.  She has  atrial fibrillation controlled on Tikosyn. She has restrictive cardiomyopathy with negative cardiac biopsy at [facility] in the past. Essential tremor following with Neuro (neg aldolase/ck/acetylcholine recept)   She continues to have dyspnea mostly on exertion starting in January 2018 of acute onset with a negative cardiac evaluation.  Dyspnea workup was negative, she is getting stronger now, voice is stronger in her dyspnea is improved. Husband is in the hospital with cellulitis and sepsis and is getting discharged today Neurology is treating her for essential tremor   She reports similar episode about 10 years ago for which she saw a neurologist and a rheumatologist and no diagnosis was made and symptoms seem to resolved spontaneously after a few months.   She is very compliant with CPAP and this seems to be helping her, husband also feels that CPAP is helping. She denies daytime somnolence. She does have a leak and this seems to bother her on occasion. CPAP download shows average pressure of 9 cm with good control of events and moderate leak  Significant tests/ events  PFTs 01/2016 showed ratio 72, FVC 74%, FEV1 69%, TLC 91% and DLCO 73% suggesting moderate intraparenchymal restriction sniff test >> no  diaphragmatic dysfunction   PSG 2008 at [facility] showed total sleep time 362 minutes and a low AHI PSG 02/2016 showed mild OSA with AHI 8/hour  Echo 1/2018showed grade 2 diastolic dysfunction with RVSP 35  CT angiogram 09/2013 was negative for pulmonary emboli  Review of Systems neg for any significant sore throat, dysphagia, itching, sneezing, nasal congestion or excess/ purulent secretions, fever, chills, sweats, unintended wt loss, pleuritic or  exertional cp, hempoptysis, orthopnea pnd or change in chronic leg swelling. Also denies presyncope, palpitations, heartburn, abdominal pain, nausea, vomiting, diarrhea or change in bowel or urinary habits, dysuria,hematuria, rash, arthralgias, visual complaints, headache, numbness weakness or ataxia.     Objective:   Physical Exam   Gen. Pleasant, obese, in no distress ENT - no lesions, no post nasal drip Neck: No JVD, no thyromegaly, no carotid bruits Lungs: no use of accessory muscles, no dullness to percussion, decreased without rales or rhonchi  Cardiovascular: Rhythm regular, heart sounds  normal, no murmurs or gallops, no peripheral edema Musculoskeletal: No deformities, no cyanosis or clubbing , no tremors        Assessment & Plan:

## 2016-08-19 NOTE — Assessment & Plan Note (Signed)
-   mask fitting session Based on this, we will write you a prescription for the right kind of mask.  CPAP is working well. CPAP supplies will be renewed for a year   .

## 2016-08-21 ENCOUNTER — Ambulatory Visit
Admission: RE | Admit: 2016-08-21 | Discharge: 2016-08-21 | Disposition: A | Discharge status: Home / Self Care | Payer: Medicare Other | Source: Ambulatory Visit | Attending: Obstetrics & Gynecology | Admitting: Obstetrics & Gynecology

## 2016-08-21 DIAGNOSIS — Z1231 Encounter for screening mammogram for malignant neoplasm of breast: Secondary | ICD-10-CM

## 2016-08-26 ENCOUNTER — Other Ambulatory Visit (HOSPITAL_BASED_OUTPATIENT_CLINIC_OR_DEPARTMENT_OTHER): Payer: Medicare Other

## 2016-09-03 ENCOUNTER — Ambulatory Visit (HOSPITAL_BASED_OUTPATIENT_CLINIC_OR_DEPARTMENT_OTHER): Payer: Medicare Other | Attending: Pulmonary Disease

## 2016-09-03 DIAGNOSIS — G4733 Obstructive sleep apnea (adult) (pediatric): Secondary | ICD-10-CM

## 2016-10-24 ENCOUNTER — Telehealth: Payer: Self-pay | Admitting: Nurse Practitioner

## 2016-10-24 DIAGNOSIS — E785 Hyperlipidemia, unspecified: Secondary | ICD-10-CM

## 2016-10-24 NOTE — Telephone Encounter (Signed)
Patient started Zetia 10 mg July 22, 2016.  I left a message for patient to call back to schedule 3 month fasting lab work to check for effectiveness of Zetia.

## 2016-10-30 NOTE — Telephone Encounter (Signed)
Returned call to patient. Patient calling back about scheduling appointment for Fasting Labs. Patient wanting to come in on Monday October 15th. Labs ordered and appointment made.

## 2016-10-30 NOTE — Telephone Encounter (Signed)
Follow up   Pt is returning call    

## 2016-11-04 ENCOUNTER — Other Ambulatory Visit: Payer: Medicare Other

## 2016-11-04 DIAGNOSIS — E785 Hyperlipidemia, unspecified: Secondary | ICD-10-CM

## 2016-11-04 LAB — HEPATIC FUNCTION PANEL
ALBUMIN: 4.1 g/dL (ref 3.6–4.8)
ALT: 15 IU/L (ref 0–32)
AST: 18 IU/L (ref 0–40)
Alkaline Phosphatase: 109 IU/L (ref 39–117)
BILIRUBIN TOTAL: 0.4 mg/dL (ref 0.0–1.2)
Bilirubin, Direct: 0.14 mg/dL (ref 0.00–0.40)
Total Protein: 7.1 g/dL (ref 6.0–8.5)

## 2016-11-04 LAB — LIPID PANEL
CHOL/HDL RATIO: 5.1 ratio — AB (ref 0.0–4.4)
Cholesterol, Total: 205 mg/dL — ABNORMAL HIGH (ref 100–199)
HDL: 40 mg/dL (ref >39)
LDL CALC: 141 mg/dL — AB (ref 0–99)
Triglycerides: 120 mg/dL (ref 0–149)
VLDL CHOLESTEROL CAL: 24 mg/dL (ref 5–40)

## 2016-11-25 ENCOUNTER — Ambulatory Visit: Payer: Medicare Other | Attending: Internal Medicine | Admitting: Physical Therapy

## 2016-11-25 ENCOUNTER — Encounter: Payer: Self-pay | Admitting: Physical Therapy

## 2016-11-25 DIAGNOSIS — H8111 Benign paroxysmal vertigo, right ear: Secondary | ICD-10-CM

## 2016-11-25 DIAGNOSIS — R2689 Other abnormalities of gait and mobility: Secondary | ICD-10-CM | POA: Insufficient documentation

## 2016-11-25 DIAGNOSIS — R42 Dizziness and giddiness: Secondary | ICD-10-CM | POA: Diagnosis present

## 2016-11-25 NOTE — Patient Instructions (Signed)
Gaze Stabilization: Tip Card  1.Target must remain in focus, not blurry, and appear stationary while head is in motion. 2.Perform exercises with small head movements (45 to either side of midline). 3.Increase speed of head motion so long as target is in focus. 4.If you wear eyeglasses, be sure you can see target through lens (therapist will give specific instructions for bifocal / progressive lenses). 5.These exercises may provoke dizziness or nausea. Work through these symptoms. If too dizzy, slow head movement slightly. Rest between each exercise. 6.Exercises demand concentration; avoid distractions.  Copyright  VHI. All rights reserved.     Special Instructions: Exercises may bring on mild to moderate symptoms of dizzy, nausea that resolve within 30 minutes of completing exercises. If symptoms are lasting longer than 30 minutes, modify your exercises by:  >decreasing the # of times you complete each activity >ensuring your symptoms return to baseline before moving onto the next exercise >dividing up exercises so you do not do them all in one session, but multiple short sessions throughout the day >doing them once a day until symptoms improve   Gaze Stabilization: Sitting    Keeping eyes on target on wall 3-4 feet away, and move head side to side for _30-60___ seconds. Repeat while moving head up and down for __30-60__ seconds. Do __2-3__ sessions per day.  Copyright  VHI. All rights reserved.    Gaze Stabilization - Tip Card  For safety, perform standing exercises close to a counter, wall, corner, or next to someone.   Gaze Stabilization - Standing Feet Apart   Feet shoulder width apart, keeping eyes on target on wall 3 feet away, tilt head down slightly and move head side to side for 30 seconds. Repeat while moving head up and down for 30 seconds. *Work up to tolerating 60 seconds, as able. Do 2-3 sessions per day.   Copyright  VHI. All rights reserved.

## 2016-11-26 NOTE — Therapy (Signed)
[facility] [address] [place], [place], [address] Phone: [phone]   Fax:  [phone]  Physical Therapy Evaluation  Patient Details  Name: [name] MRN: [id number] Date of Birth: 1950/02/09 Referring Provider: Creola Corn MD   Encounter Date: 11/25/2016  PT End of Session - 11/25/16 1722    Visit Number  1    Number of Visits  9    Date for PT Re-Evaluation  01/24/17    Authorization Type  [employer] Medicare    Authorization Time Period  11/25/16 to 01/24/17    PT Start Time  1537    PT Stop Time  1634    PT Time Calculation (min)  57 min    Activity Tolerance  Patient tolerated treatment well    Behavior During Therapy  [facility] for tasks assessed/performed       Past Medical History:  Diagnosis Date  . Back pain    nerve problems-had series of three injections  . Chronic anticoagulation   . Diastolic congestive heart failure (HCC)   . Dyspnea   . Hyperlipemia   . Migraine headache   . Paroxysmal atrial fibrillation (HCC)   . Restrictive cardiomyopathy (HCC)   . Tubal pregnancy     Past Surgical History:  Procedure Laterality Date  . BREAST LUMPECTOMY     benign  . CARDIAC CATHETERIZATION    . CARDIOVERSION    . CHOLECYSTECTOMY    . ECTOPIC PREGNANCY SURGERY  1980  . EYE SURGERY     left eye injections x2-was having "stroke like" flashes of light  . heart biopsy  2008   [facility]  . HYSTEROSCOPY  6/05   and D&C-simple hyperplasia secondary PMP bleeding  . ROTATOR CUFF REPAIR     bilateral    There were no vitals filed for this visit.   Subjective Assessment - 11/25/16 1543    Subjective  Three weeks ago woke up and digital clock was spinning; ceiling was spinning. Nauseated with vomiting worse first day, and less second day. Non-stop spinning for ~2 days and then progressively got better. Today it's dizziness/lightheadedness unless I lie down and get up, then it's spinning. Gets better as  the day goes. Got hearing aids 3 months ago; new progressive glasses since July. Ringing in ears all the time for years. Had a bout of PT ~15 yrs ago for vertigo--did the "A" exercises.     Patient is accompained by:  Family member Sudie Grumbling   Pertinent History  back pain, cardiomyopathy, CHF, dyspnea, migraine, PAF with anticoagulation, bil RCT, essential tremor    Limitations  Walking    Diagnostic tests  MRI brain 04/2016 no acute changes    Patient Stated Goals  feel more "sure footed" when walking    Currently in Pain?  No/denies         [facility] PT Assessment - 11/25/16 1548      Assessment   Medical Diagnosis  vertigo    Referring Provider  Creola Corn MD    Onset Date/Surgical Date  -- ~3 weeks ago   ~3 weeks ago   Prior Therapy  ~15 yrs ago for vertigo (she describes treatment for hypofxn)      Precautions   Precautions  Fall      Balance Screen   Has the patient fallen in the past 6 months  No    Has the patient had a decrease in activity level because of a fear  of falling?   No    Is the patient reluctant to leave their home because of a fear of falling?   No      Home Environment   Additional Comments  with husband; both retired      Prior Function   Level of Independence  Independent    Vocation  Retired      Copy Status  Within Functional Limits for tasks assessed      Observation/Other Assessments   Observations  guarded, wide BOS ambulation from lobby to exam room    Focus on Therapeutic Outcomes (FOTO)   NT      Sensation   Additional Comments  denies numbness in feet      Bed Mobility   Bed Mobility  Rolling Right;Rolling Left;Right Sidelying to Sit;Left Sidelying to Sit    Rolling Right  6: Modified independent (Device/Increase time)    Rolling Left  6: Modified independent (Device/Increase time)    Right Sidelying to Sit  6: Modified independent (Device/Increase time)    Left Sidelying to Sit  6: Modified independent  (Device/Increase time)      Transfers   Transfers  Sit to Stand    Sit to Stand  6: Modified independent (Device/Increase time)    Comments  incr time to control dizziness      Ambulation/Gait   Ambulation/Gait  Yes    Ambulation/Gait Assistance  6: Modified independent (Device/Increase time)    Ambulation Distance (Feet)  60 Feet 50, 50   50, 50   Assistive device  None    Gait Pattern  Step-through pattern;Decreased step length - right;Decreased step length - left;Wide base of support    Ambulation Surface  Indoor         Vestibular Assessment - 11/25/16 0001      Vestibular Assessment   General Observation  walks with guarded posture, not turning head      Symptom Behavior   Type of Dizziness  Spinning    Frequency of Dizziness  daily    Duration of Dizziness  2-3 minutes from supine; sit to stand ~60 seconds    Aggravating Factors  Sit to stand    Relieving Factors  Closing eyes covering eyes   covering eyes     Occulomotor Exam   Occulomotor Alignment  Normal    Spontaneous  Absent    Gaze-induced  -- 2 beats nystagmus first testing (both rt/lt sides), then non   2 beats nystagmus first testing (both rt/lt sides), then non   Smooth Pursuits  Intact    Saccades  Intact      Vestibulo-Occular Reflex   Comment  HIT negative      Visual Acuity   Static  6    Dynamic  5      Positional Testing   Dix-Hallpike  Dix-Hallpike Right;Dix-Hallpike Left    Horizontal Canal Testing  Horizontal Canal Right;Horizontal Canal Left      Dix-Hallpike Right   Dix-Hallpike Right Duration  35 sec    Dix-Hallpike Right Symptoms  No nystagmus "things look like they are shaking back and forth"   "things look like they are shaking back and forth"     Dix-Hallpike Left   Dix-Hallpike Left Duration  0    Dix-Hallpike Left Symptoms  No nystagmus      Horizontal Canal Right   Horizontal Canal Right Duration  0    Horizontal Canal Right Symptoms  Normal  Horizontal Canal  Left   Horizontal Canal Left Duration  0    Horizontal Canal Left Symptoms  Normal         Objective measurements completed on examination: See above findings.       Vestibular Treatment/Exercise - 11/26/16 0001      Vestibular Treatment/Exercise   Vestibular Treatment Provided  Canalith Repositioning;Gaze    Canalith Repositioning  Epley Manuever Right    Gaze Exercises  X1 Viewing Horizontal;X1 Viewing Vertical       EPLEY MANUEVER RIGHT   Number of Reps   2    Overall Response  Improved Symptoms    Response Details   2nd rep, no symptoms positions 1 & 2, + sense of movement position 3 lasting <5 seconds; asymptomatic upon sitting up      X1 Viewing Horizontal   Comments  pt had done previously; verbally reviewed and gave handout      X1 Viewing Vertical   Comments  pt had done previously; verbally reviewed and gave handout            PT Education - 11/25/16 1720    Education provided  Yes    Education Details  results of vestibular evaluation (including some characteristics consistent with Rt posterior BPPV and ?neuritis); quickly reviewed x 1 exercises (pt had done previously)    Person(s) Educated  Patient;Spouse    Methods  Explanation;Demonstration;Handout    Comprehension  Verbalized understanding;Need further instruction       PT Short Term Goals - 11/26/16 0703      PT SHORT TERM GOAL #1   Title  Get baseline DHI and set LTG as appropriate. (Target all STGs 12/03/16)    Time  1    Period  Weeks    Status  New    Target Date  12/03/16      PT SHORT TERM GOAL #2   Title  Measure gait velocity and update LTG as appropriate.    Time  1    Period  Weeks    Status  New        PT Long Term Goals - 11/26/16 [id number]      PT LONG TERM GOAL #1   Title  Patient will have no sense of movement with positional testing (negative tests). (Target all LTGs 12/20/16)    Time  4    Period  Weeks    Status  New    Target Date  12/20/16      PT LONG TERM  GOAL #2   Title  Patient will be independent with HEP.    Time  4    Period  Weeks    Status  New      PT LONG TERM GOAL #3   Title  Patient will improve gait velocity by >= 0.3 ft/sec as measure of improving balance and decreasing fall risk.    Time  4    Period  Weeks    Status  New             Plan - 11/25/16 1656    Clinical Impression Statement  Patient presents with 3 week history of vertigo, imbalance, and lightheadedness. Overall, her described symptoms seem indicative of a vestibular neuritis with possible rt posterior canal BPPV. She has a complicated history of hearing loss, tinnitus, and migraines (with photophobia) and therefore difficult to fully tease apart the cause(s) of her lingering symptoms. Will plan to see for continued vestibular assessment and treatment  as anticipate she can benefit from vestibular rehab.     History and Personal Factors relevant to plan of care:  PMH-back pain, cardiomyopathy, CHF, dyspnea, migraines, PAF with anticoagulation, essential tremor;  Personal factors-none    Clinical Presentation  Evolving    Clinical Presentation due to:  symptoms of vertigo/dizziness not following a clear pattern of BPPV or neuritis    Clinical Decision Making  Moderate    Rehab Potential  Good    Clinical Impairments Affecting Rehab Potential  acute onset of symptoms; have been improving     PT Frequency  2x / week    PT Duration  4 weeks    PT Treatment/Interventions  ADLs/Self Care Home Management;Canalith Repostioning;Gait training;Functional mobility training;Therapeutic activities;Therapeutic exercise;Balance training;Neuromuscular re-education;Patient/family education;Vestibular;Visual/perceptual remediation/compensation    PT Next Visit Plan  do SOT; get gait velocity, give DHI for homework to get baseline; check how she's doing with VOR x 1 (advance or ?d/c; DVA and HIT were negative, but she got nausea and feeling "off balance" when doing VORx1, so I  gave it to her for HEP); ask re: family history of Meniere's?? fullness in her ears? , ?FGA    Consulted and Agree with Plan of Care  Patient;Family member/caregiver    Family Member Consulted  husband, [name]       Patient will benefit from skilled therapeutic intervention in order to improve the following deficits and impairments:  Abnormal gait, Decreased balance, Difficulty walking, Dizziness, Other (comment)(vertigo)  Visit Diagnosis: BPPV (benign paroxysmal positional vertigo), right - Plan: PT plan of care cert/re-cert  Dizziness and giddiness - Plan: PT plan of care cert/re-cert  Other abnormalities of gait and mobility - Plan: PT plan of care cert/re-cert  G-Codes - 12-19-16 0708    Functional Assessment Tool Used (Outpatient Only)  + rt Hallpike-Dix indicative of Rt posterior canal BPPV    Functional Limitation  Changing and maintaining body position    Mobility: Walking and Moving Around Current Status (Z6109)  --    Mobility: Walking and Moving Around Goal Status (U0454)  --    Changing and Maintaining Body Position Current Status (U9811)  At least 40 percent but less than 60 percent impaired, limited or restricted    Changing and Maintaining Body Position Goal Status (B1478)  At least 1 percent but less than 20 percent impaired, limited or restricted        Problem List Patient Active Problem List   Diagnosis Date Noted  . Morbid obesity (HCC) 05/13/2016  . OSA (obstructive sleep apnea) 02/27/2016  . Umbilical hernia without obstruction and without gangrene 11/04/2013  . Essential tremor 10/29/2012  . Cardiomyopathy (HCC) 04/05/2011  . Cyclitis, chronic 04/05/2011  . Degeneration macular 04/05/2011  . Migraine headache   . Dyspnea   . Hyperlipemia   . Chronic anticoagulation   . Chronic diastolic CHF (congestive heart failure) (HCC) 10/12/2009  . RESTLESS LEG SYNDROME 12/27/2006  . MIGRAINE, CHRONIC 12/27/2006  . ATRIAL FIBRILLATION 12/27/2006  . DYSPNEA  12/27/2006    Zena Amos, PT 12-19-16, 7:54 AM  [facility] [address] [place], [place], [address] Phone: [phone]   Fax:  [phone]  Name: [name] MRN: [id number] Date of Birth: Jan 22, 1950

## 2016-11-27 ENCOUNTER — Ambulatory Visit: Payer: Medicare Other

## 2016-11-27 DIAGNOSIS — H8111 Benign paroxysmal vertigo, right ear: Secondary | ICD-10-CM | POA: Diagnosis not present

## 2016-11-27 DIAGNOSIS — R42 Dizziness and giddiness: Secondary | ICD-10-CM

## 2016-11-27 DIAGNOSIS — R2689 Other abnormalities of gait and mobility: Secondary | ICD-10-CM

## 2016-11-27 NOTE — Therapy (Signed)
[facility] [address] [place], [place], [address] Phone: [phone]   Fax:  [phone]  Physical Therapy Treatment  Patient Details  Name: [name] MRN: [id number] Date of Birth: Dec 17, 1950 Referring Provider: Creola Corn MD   Encounter Date: 11/27/2016  PT End of Session - 11/27/16 1246    Visit Number  2    Number of Visits  9    Date for PT Re-Evaluation  01/24/17    Authorization Type  [employer]HC Medicare    Authorization Time Period  11/25/16 to 01/24/17    PT Start Time  1147    PT Stop Time  1229    PT Time Calculation (min)  42 min    Equipment Utilized During Treatment  -- SOT harness   SOT harness   Activity Tolerance  Other (comment) limited by incr. dizziness after SOT   limited by incr. dizziness after SOT   Behavior During Therapy  [facility] for tasks assessed/performed       Past Medical History:  Diagnosis Date  . Back pain    nerve problems-had series of three injections  . Chronic anticoagulation   . Diastolic congestive heart failure (HCC)   . Dyspnea   . Hyperlipemia   . Migraine headache   . Paroxysmal atrial fibrillation (HCC)   . Restrictive cardiomyopathy (HCC)   . Tubal pregnancy     Past Surgical History:  Procedure Laterality Date  . BREAST LUMPECTOMY     benign  . CARDIAC CATHETERIZATION    . CARDIOVERSION    . CHOLECYSTECTOMY    . ECTOPIC PREGNANCY SURGERY  1980  . EYE SURGERY     left eye injections x2-was having "stroke like" flashes of light  . heart biopsy  2008   [facility]  . HYSTEROSCOPY  6/05   and D&C-simple hyperplasia secondary PMP bleeding  . ROTATOR CUFF REPAIR     bilateral    There were no vitals filed for this visit.  Subjective Assessment - 11/27/16 1150    Subjective  PT denied falls or changing since last visit. Pt reports some wooziness at rest (3-4/10).     Pertinent History  back pain, cardiomyopathy, CHF, dyspnea, migraine, PAF with  anticoagulation, bil RCT, essential tremor    Diagnostic tests  MRI brain 04/2016 no acute changes    Patient Stated Goals  feel more "sure footed" when walking    Currently in Pain?  No/denies       Neuro re-ed: Neuro re-ed: sensory organization test performed with following results: Conditions: 1: WNL 2: WNL 3: WNL  4: WNL 5: WNL 6: WNL Composite score: 82 (WNL)  Sensory Analysis Som: WNL Vis: WNL Vest: WNL Pref: WNL Strategy analysis: Decr. Ankle/Hip strategy during conditions 5 and 6.       COG alignment: Anterior bias       Pt reported 7/10 dizziness after SOT and required rest break.            [facility] Adult PT Treatment/Exercise - 11/27/16 1152      Ambulation/Gait   Ambulation/Gait  Yes    Ambulation/Gait Assistance  6: Modified independent (Device/Increase time)    Ambulation Distance (Feet)  100 Feet    Assistive device  None    Gait Pattern  Step-through pattern;Decreased step length - right;Decreased step length - left;Wide base of support    Ambulation Surface  Indoor;Level    Gait velocity  3.55ft/sec  PT Education - 11/27/16 1245    Education provided  Yes    Education Details  PT discussed gait speed results and SOT results. PT asked pt if she was taking Meclizine daily and pt stated yes. PT asked pt to limit Meclizines (it is prescribed as needed), if she can tolerate, as Meclizine can suppress response during treatment/assessement.     Person(s) Educated  Patient    Methods  Explanation       PT Short Term Goals - 11/26/16 0703      PT SHORT TERM GOAL #1   Title  Get baseline DHI and set LTG as appropriate. (Target all STGs 12/03/16)    Time  1    Period  Weeks    Status  New    Target Date  12/03/16      PT SHORT TERM GOAL #2   Title  Measure gait velocity and update LTG as appropriate.    Time  1    Period  Weeks    Status  New        PT Long Term Goals - 11/26/16 [id number]      PT LONG TERM GOAL #1   Title   Patient will have no sense of movement with positional testing (negative tests). (Target all LTGs 12/20/16)    Time  4    Period  Weeks    Status  New    Target Date  12/20/16      PT LONG TERM GOAL #2   Title  Patient will be independent with HEP.    Time  4    Period  Weeks    Status  New      PT LONG TERM GOAL #3   Title  Patient will improve gait velocity by >= 0.3 ft/sec as measure of improving balance and decreasing fall risk.    Time  4    Period  Weeks    Status  New            Plan - 11/27/16 1247    Clinical Impression Statement  Pt's gait speed was WNL but pt reports gait speed is less than her normal speed 2/2 dizziness. Pt's SOT results were WNL for her gender and age group (60-69y/o), however, pt reported 7/10 dizziness after SOT and required a 10 minute rest break to allow dizziness to subside back to 04/25/08. PT will reassess for BPPV next session, without Meclizine (if tolerated by pt). Continue with POC.     PT Next Visit Plan  [facility]; check how she's doing with VOR x 1 (advance or ?d/c; ask re: family history of Meniere's?? fullness in her ears? , ?FGA    Consulted and Agree with Plan of Care  Patient;Family member/caregiver    Family Member Consulted  husband, [name]       Patient will benefit from skilled therapeutic intervention in order to improve the following deficits and impairments:     Visit Diagnosis: Other abnormalities of gait and mobility  Dizziness and giddiness  BPPV (benign paroxysmal positional vertigo), right  Problem List Patient Active Problem List   Diagnosis Date Noted  . Morbid obesity (HCC) 05/13/2016  . OSA (obstructive sleep apnea) 02/27/2016  . Umbilical hernia without obstruction and without gangrene 11/04/2013  . Essential tremor 10/29/2012  . Cardiomyopathy (HCC) 04/05/2011  . Cyclitis, chronic 04/05/2011  . Degeneration macular 04/05/2011  . Migraine headache   . Dyspnea   .  Hyperlipemia   . Chronic anticoagulation   . Chronic diastolic CHF (congestive heart failure) (HCC) 10/12/2009  . RESTLESS LEG SYNDROME 12/27/2006  . MIGRAINE, CHRONIC 12/27/2006  . ATRIAL FIBRILLATION 12/27/2006  . DYSPNEA 12/27/2006    Mahad Newstrom L 11/27/2016, 12:51 PM  [name] [address] [place], [place], [address] Phone: [phone]   Fax:  [phone]  Name: [name] MRN: [id number] Date of Birth: [birth year]  Zerita Boers, PT,DPT 11/27/16 12:53 PM Phone: [phone] Fax: [phone]

## 2016-12-03 ENCOUNTER — Ambulatory Visit: Payer: Medicare Other

## 2016-12-03 DIAGNOSIS — R2689 Other abnormalities of gait and mobility: Secondary | ICD-10-CM

## 2016-12-03 DIAGNOSIS — H8111 Benign paroxysmal vertigo, right ear: Secondary | ICD-10-CM | POA: Diagnosis not present

## 2016-12-03 DIAGNOSIS — R42 Dizziness and giddiness: Secondary | ICD-10-CM

## 2016-12-03 NOTE — Patient Instructions (Signed)
Gaze Stabilization: Tip Card  1.Target must remain in focus, not blurry, and appear stationary while head is in motion. 2.Perform exercises with small head movements (45 to either side of midline). 3.Increase speed of head motion so long as target is in focus. 4.If you wear eyeglasses, be sure you can see target through lens (therapist will give specific instructions for bifocal / progressive lenses). 5.These exercises may provoke dizziness or nausea. Work through these symptoms. If too dizzy, slow head movement slightly. Rest between each exercise. 6.Exercises demand concentration; avoid distractions.  Copyright  VHI. All rights reserved.     Special Instructions: Exercises may bring on mild to moderate symptoms of dizzy, nausea that resolve within 30 minutes of completing exercises. If symptoms are lasting longer than 30 minutes, modify your exercises by:  >decreasing the # of times you complete each activity >ensuring your symptoms return to baseline before moving onto the next exercise >dividing up exercises so you do not do them all in one session, but multiple short sessions throughout the day >doing them once a day until symptoms improve   Gaze Stabilization: Sitting    Keeping eyes on target on wall 3-4 feet away, TUCK CHIN, and move head side to side for _30___ seconds. Repeat while moving head up and down for __30__ seconds. Do __2-3__ sessions per day.  Copyright  VHI. All rights reserved.

## 2016-12-03 NOTE — Therapy (Signed)
[facility] [address] [place], [place], [address] Phone: [phone]   Fax:  [phone]  Physical Therapy Treatment  Patient Details  Name: [name] MRN: [id number] Date of Birth: [birth year] Referring Provider: Creola Corn MD   Encounter Date: 12/03/2016  PT End of Session - 12/03/16 0839    Visit Number  3    Number of Visits  9    Date for PT Re-Evaluation  01/24/17    Authorization Type  [employer]HC Medicare    Authorization Time Period  11/25/16 to 01/24/17    PT Start Time  0808 pt arrived late    PT Stop Time  0837 pt completed DHI at end of session 0837-0840    PT Time Calculation (min)  29 min    Equipment Utilized During Treatment  -- min guard to S prn    Activity Tolerance  Patient tolerated treatment well    Behavior During Therapy  [facility] for tasks assessed/performed       Past Medical History:  Diagnosis Date  . Back pain    nerve problems-had series of three injections  . Chronic anticoagulation   . Diastolic congestive heart failure (HCC)   . Dyspnea   . Hyperlipemia   . Migraine headache   . Paroxysmal atrial fibrillation (HCC)   . Restrictive cardiomyopathy (HCC)   . Tubal pregnancy     Past Surgical History:  Procedure Laterality Date  . BREAST LUMPECTOMY     benign  . CARDIAC CATHETERIZATION    . CARDIOVERSION    . CHOLECYSTECTOMY    . ECTOPIC PREGNANCY SURGERY  1980  . EYE SURGERY     left eye injections x2-was having "stroke like" flashes of light  . heart biopsy  2008   [facility]  . HYSTEROSCOPY  6/05   and D&C-simple hyperplasia secondary PMP bleeding  . ROTATOR CUFF REPAIR     bilateral    There were no vitals filed for this visit.  Subjective Assessment - 12/03/16 0811    Subjective  Pt denied falls or changes since last visit. Pt is wearing progressive glasses today, she doesn't wear them while performing house work. Pt has to remain seated after x1 viewing HEP for  5-75minutes after exercise (2/2 dizziness and "laziness per pt report).    Pertinent History  back pain, cardiomyopathy, CHF, dyspnea, migraine, PAF with anticoagulation, bil RCT, essential tremor    Diagnostic tests  MRI brain 04/2016 no acute changes    Patient Stated Goals  feel more "sure footed" when walking    Currently in Pain?  No/denies         [facility] PT Assessment - 12/03/16 0812      Functional Gait  Assessment   Gait assessed   Yes    Gait Level Surface  Walks 20 ft in less than 7 sec but greater than 5.5 sec, uses assistive device, slower speed, mild gait deviations, or deviates 6-10 in outside of the 12 in walkway width. 5.9ft/sec.    Change in Gait Speed  Able to smoothly change walking speed without loss of balance or gait deviation. Deviate no more than 6 in outside of the 12 in walkway width.    Gait with Horizontal Head Turns  Performs head turns smoothly with slight change in gait velocity (eg, minor disruption to smooth gait path), deviates 6-10 in outside 12 in walkway width, or uses an assistive device.    Gait with Vertical Head Turns  Performs task with slight change in gait velocity (eg, minor disruption to smooth gait path), deviates 6 - 10 in outside 12 in walkway width or uses assistive device    Gait and Pivot Turn  Pivot turns safely in greater than 3 sec and stops with no loss of balance, or pivot turns safely within 3 sec and stops with mild imbalance, requires small steps to catch balance.    Step Over Obstacle  Is able to step over 2 stacked shoe boxes taped together (9 in total height) without changing gait speed. No evidence of imbalance.    Gait with Narrow Base of Support  Is able to ambulate for 10 steps heel to toe with no staggering.    Gait with Eyes Closed  Walks 20 ft, uses assistive device, slower speed, mild gait deviations, deviates 6-10 in outside 12 in walkway width. Ambulates 20 ft in less than 9 sec but greater than 7 sec.    Ambulating  Backwards  Walks 20 ft, uses assistive device, slower speed, mild gait deviations, deviates 6-10 in outside 12 in walkway width.    Steps  Alternating feet, no rail.    Total Score  24    FGA comment:  24/30: indicates pt is at moderate risk for falls. Average dizziness during FGA per pt report: 4/10.         DHI: 66%-no charge          Vestibular Treatment/Exercise - 12/03/16 0837      Vestibular Treatment/Exercise   Vestibular Treatment Provided  Gaze    Gaze Exercises  X1 Viewing Horizontal;X1 Viewing Vertical      X1 Viewing Horizontal   Foot Position  Seated and standing with feet apart    Time  -- 30 sec.    Reps  3    Comments  Cues and demo for technique.      X1 Viewing Vertical   Foot Position  Seated and standing with feet apart    Time  -- 30 sec.    Reps  3    Comments  Cues and demo for technique. Please see pt instructions for HEP details. PT is having pt perform in seated position only as dizzinesss is 3-4/10 in seated and required cues for technique.             PT Education - 12/03/16 [id number]    Education provided  Yes    Education Details  PT reviewed x1 viewing HEP. PT discussed FGA results.     Person(s) Educated  Patient    Methods  Explanation;Demonstration;Verbal cues;Handout    Comprehension  Returned demonstration;Verbalized understanding;Need further instruction       PT Short Term Goals - 12/03/16 0845      PT SHORT TERM GOAL #1   Title  Get baseline DHI and set LTG as appropriate. (Target all STGs 12/03/16)    Time  1    Period  Weeks    Status  Achieved      PT SHORT TERM GOAL #2   Title  Measure gait velocity and update LTG as appropriate.    Time  1    Period  Weeks    Status  Achieved        PT Long Term Goals - 12/03/16 0845      PT LONG TERM GOAL #1   Title  Patient will have no sense of movement with positional testing (negative tests). (Target all LTGs 12/20/16)    Time  4    Period  Weeks    Status  New       PT LONG TERM GOAL #2   Title  Patient will be independent with HEP.    Time  4    Period  Weeks    Status  New      PT LONG TERM GOAL #3   Title  Patient will improve gait velocity by >= 0.3 ft/sec as measure of improving balance and decreasing fall risk.    Time  4    Period  Weeks    Status  New      PT LONG TERM GOAL #4   Title  Pt will improve DHI score to </=48% to improve QOL.    Status  New            Plan - 12/03/16 5409    Clinical Impression Statement  Pt's FGA score indicates pt is at a moderate risk for falls. PT had pt perform x1 viewing (horizontal and vertical) HEP, and modified as tolerated to improve technique. Pt's DHI score (66%) indicates she perceived dizziness as a severe impact on functional abilities. Pt would continue to benefit from skilled PT to improve safety during functional mobility.     Clinical Impairments Affecting Rehab Potential  acute onset of symptoms; have been improving     PT Frequency  2x / week    PT Duration  4 weeks    PT Treatment/Interventions  ADLs/Self Care Home Management;Canalith Repostioning;Gait training;Functional mobility training;Therapeutic activities;Therapeutic exercise;Balance training;Neuromuscular re-education;Patient/family education;Vestibular;Visual/perceptual remediation/compensation    PT Next Visit Plan  Check VOR and progress as indicated, add gait with head turns/nods to HEP. Reassess for R BPPV.    Consulted and Agree with Plan of Care  Patient;Family member/caregiver    Family Member Consulted  husband, [name]       Patient will benefit from skilled therapeutic intervention in order to improve the following deficits and impairments:  Abnormal gait, Decreased balance, Difficulty walking, Dizziness, Other (comment)  Visit Diagnosis: Dizziness and giddiness  Other abnormalities of gait and mobility     Problem List Patient Active Problem List   Diagnosis Date Noted  . Morbid obesity (HCC) 05/13/2016   . OSA (obstructive sleep apnea) 02/27/2016  . Umbilical hernia without obstruction and without gangrene 11/04/2013  . Essential tremor 10/29/2012  . Cardiomyopathy (HCC) 04/05/2011  . Cyclitis, chronic 04/05/2011  . Degeneration macular 04/05/2011  . Migraine headache   . Dyspnea   . Hyperlipemia   . Chronic anticoagulation   . Chronic diastolic CHF (congestive heart failure) (HCC) 10/12/2009  . RESTLESS LEG SYNDROME 12/27/2006  . MIGRAINE, CHRONIC 12/27/2006  . ATRIAL FIBRILLATION 12/27/2006  . DYSPNEA 12/27/2006    Taran Hable L 12/03/2016, 8:46 AM   [facility] [address] [place], [place], [address] Phone: [phone]   Fax:  [phone]  Name: [name] MRN: [id number] Date of Birth: 1950/05/09  Zerita Boers, PT,DPT 12/03/16 8:46 AM Phone: [phone] Fax: [phone]

## 2016-12-05 ENCOUNTER — Ambulatory Visit (INDEPENDENT_AMBULATORY_CARE_PROVIDER_SITE_OTHER): Payer: Medicare Other | Admitting: Pharmacist

## 2016-12-05 ENCOUNTER — Telehealth: Payer: Self-pay | Admitting: Cardiovascular Disease

## 2016-12-05 DIAGNOSIS — E782 Mixed hyperlipidemia: Secondary | ICD-10-CM

## 2016-12-05 MED ORDER — ATORVASTATIN CALCIUM 20 MG PO TABS: 20.0000 mg | ORAL_TABLET | Freq: Every day | ORAL | 11 refills | Status: DC

## 2016-12-05 NOTE — Patient Instructions (Signed)
Restart your Lipitor (atorvastatin) 20mg  daily  Continue taking your Zetia 10mg  daily  Have your primary care doctor recheck your cholesterol (fasting labs) in January

## 2016-12-05 NOTE — Telephone Encounter (Signed)
New Message  Pt husband call requesting to speak with Aundra Millet to ask some questions about pt appt for today. Please call back to discuss

## 2016-12-05 NOTE — Telephone Encounter (Signed)
Unsure why pt's husband was calling as he called when I was in the middle of visit with pt. LMOM for him to return call if he had any further concerns.

## 2016-12-05 NOTE — Progress Notes (Signed)
Patient ID: [name]                 DOB: 01/06/1951                    MRN: [id number]     HPI: [name] is a 66 y.o. female patient referred to lipid clinic by Dr Elease HashimotoNahser. PMH is significant for PAF, HLD, and diastolic HF. Most recent lipid panel drawn 1 month ago revealed LDL of 141 on Zetia therapy. Pt presents to lipid clinic for further management.  Pt reports tolerating Zetia well. She has previously taken Crestor, which caused immediate nausea. Pt only took this for 2 days before stopping secondary to nausea. She also took Lipitor 20mg  in the past. She does not recall any adverse effects with this, but thinks that therapy was stopped because her cholesterol improved. Pt does not smoke, drink, have HTN, DM, or strong family history of CAD. One of her grandmas died from an MI in her 4670s, however other family history is non contributory. Her 10 year ASCVD risk is 6.4%. Pt reports dealing with vertigo and tremor in her hands. The vertigo has prevented [facility] [employer]    Current Medications: Zetia 10mg  daily Intolerances: atorvastatin 20mg  daily - no side effects, rosuvastatin - immediate nausea Risk Factors: none LDL goal: 130mg /dL  Diet: Breakfast - bagel, cereal, or fruit. Drinks OJ and coffee in the morning. Husband cooks at home. Does eat red meat occasionally. Drinks low fat 1% milk.  Ex[facility] [facility] [place] -[place] been preventing this  Family History: [name] family history includes Atrial fibrillation in her mother; Breast cancer in her sister; Cancer in her maternal grandmother and maternal uncle; Heart disease in her sister; Hypertension in her father; Thyroid cancer in her sister.   Social History: The patient  reports that she has never smoked. She has never used smokeless tobacco. She reports that she drinks alcohol. She reports that she does not use drugs.   Labs: 11/04/2016: TC 205, TG 120, HDL 40, LDL 141 (Zetia 10mg   daily)  Past Medical History:  Diagnosis Date  . Back pain    nerve problems-had series of three injections  . Chronic anticoagulation   . Diastolic congestive heart failure (HCC)   . Dyspnea   . Hyperlipemia   . Migraine headache   . Paroxysmal atrial fibrillation (HCC)   . Restrictive cardiomyopathy (HCC)   . Tubal pregnancy     Current Outpatient Medications on File Prior to Visit  Medication Sig Dispense Refill  . dofetilide (TIKOSYN) 125 MCG capsule TAKE 3 CAPSULES BY MOUTH  TWO TIMES DAILY 540 capsule 3  . ezetimibe (ZETIA) 10 MG tablet Take 1 tablet (10 mg total) by mouth daily. 90 tablet 3  . fluticasone (FLONASE) 50 MCG/ACT nasal spray Place 1 spray daily into both nostrils.    . furosemide (LASIX) 40 MG tablet TAKE 1 TABLET BY MOUTH TWO  TIMES DAILY 180 tablet 3  . gabapentin (NEURONTIN) 600 MG tablet Take 900 mg by mouth at bedtime.     . lidocaine (XYLOCAINE) 2 % solution Use as directed 20 mLs in the mouth or throat daily as needed for mouth pain. Headache    . meclizine (ANTIVERT) 25 MG tablet Take 25 mg 3 (three) times daily as needed by mouth for dizziness.    . metoCLOPramide (REGLAN) 10 MG tablet Take 10 mg by mouth 4 (  four) times daily.    . Multiple Vitamins-Minerals (ICAPS AREDS 2) CAPS Take 1 capsule by mouth 2 (two) times daily.    . potassium chloride SA (K-DUR,KLOR-CON) 20 MEQ tablet TAKE 1 TABLET BY MOUTH TWO  TIMES DAILY 180 tablet 3  . spironolactone (ALDACTONE) 25 MG tablet TAKE 1 TABLET BY MOUTH  DAILY 90 tablet 3  . XARELTO 20 MG TABS tablet TAKE 1 TABLET BY MOUTH  DAILY WITH SUPPER (Patient taking differently: TAKE 1 TABLET BY MOUTH  DAILY WITH BREAKFAST) 90 tablet 3   No current facility-administered medications on file prior to visit.     Allergies  Allergen Reactions  . Topamax [Topiramate] Other (See Comments)    hallucinations   . Codeine Nausea And Vomiting    VERTIGO  . Oxycodone     Hives   . Penicillins Rash     Assessment/Plan:  1. Hyperlipidemia - LDL above goal 130mg /dL for primary prevention with overall low risk and 10 year ASCVD risk of 6.4%. Pt is willing to restart Lipitor 20mg  daily. She just picked up a 3 month supply of her Zetia and will continue to take this. Will likely be able to stop her Zetia in the long run due to expected 30-50% reduction in LDL from Lipitor 20mg  daily monotherapy. Pt will have her lipids checked in 3 months at her PCP. F/u in lipid clinic as needed.   Megan E. Supple, PharmD, CPP, BCACP [facility] [address], [place], [place] [address] Phone: [phone]; Fax: [phone] 12/05/2016 10:13 AM

## 2016-12-06 ENCOUNTER — Ambulatory Visit: Payer: Medicare Other

## 2016-12-06 DIAGNOSIS — H8111 Benign paroxysmal vertigo, right ear: Secondary | ICD-10-CM | POA: Diagnosis not present

## 2016-12-06 DIAGNOSIS — R42 Dizziness and giddiness: Secondary | ICD-10-CM

## 2016-12-06 NOTE — Therapy (Signed)
[facility] [address] [place], [place], [address] Phone: [phone]   Fax:  [phone]  Physical Therapy Treatment  Patient Details  Name: [name] MRN: [id number] Date of Birth: 1951-01-15 Referring Provider: Creola Corn MD   Encounter Date: 12/06/2016  PT End of Session - 12/06/16 1308    Visit Number  4    Number of Visits  9    Date for PT Re-Evaluation  01/24/17    Authorization Type  UHC Medicare    Authorization Time Period  11/25/16 to 01/24/17    PT Start Time  1149    PT Stop Time  1229    PT Time Calculation (min)  40 min    Activity Tolerance  Patient tolerated treatment well    Behavior During Therapy  [facility] for tasks assessed/performed       Past Medical History:  Diagnosis Date  . Back pain    nerve problems-had series of three injections  . Chronic anticoagulation   . Diastolic congestive heart failure (HCC)   . Dyspnea   . Hyperlipemia   . Migraine headache   . Paroxysmal atrial fibrillation (HCC)   . Restrictive cardiomyopathy (HCC)   . Tubal pregnancy     Past Surgical History:  Procedure Laterality Date  . BREAST LUMPECTOMY     benign  . CARDIAC CATHETERIZATION    . CARDIOVERSION    . CHOLECYSTECTOMY    . ECTOPIC PREGNANCY SURGERY  1980  . EYE SURGERY     left eye injections x2-was having "stroke like" flashes of light  . heart biopsy  2008   [facility]  . HYSTEROSCOPY  6/05   and D&C-simple hyperplasia secondary PMP bleeding  . ROTATOR CUFF REPAIR     bilateral    There were no vitals filed for this visit.  Subjective Assessment - 12/06/16 1151    Subjective  Pt denied falls since last visit. Pt did experience dizziness during dental appt. this morning when transitioning from supine to sit. Pt reported current dizziness is 4/10, and that 7-8/10 during dental appt. Pt reported dizziness has been better, even after performing HEP. Pt describes dizziness as "back and  forth". Pt hasn't taken meclizine or allergy medication for 1.5 days.     Pertinent History  back pain, cardiomyopathy, CHF, dyspnea, migraine, PAF with anticoagulation, bil RCT, essential tremor    Patient Stated Goals  feel more "sure footed" when walking    Currently in Pain?  No/denies             Vestibular Assessment - 12/06/16 1154      Positional Testing   Dix-Hallpike  Dix-Hallpike Right;Dix-Hallpike Left    Horizontal Canal Testing  Horizontal Canal Right;Horizontal Canal Left      Dix-Hallpike Right   Dix-Hallpike Right Duration  <45 sec with 5/10 dizziness, 7-8/10 dizziness after Epley.    Dix-Hallpike Right Symptoms  Upbeat, right rotatory nystagmus with appearance of L saccade?      Horizontal Canal Right   Horizontal Canal Right Duration  none    Horizontal Canal Right Symptoms  Normal               Vestibular Treatment/Exercise - 12/06/16 1206      Vestibular Treatment/Exercise   Vestibular Treatment Provided  Canalith Repositioning;Gaze    Canalith Repositioning  Epley Manuever Right;Semont Procedure Right Posterior    Gaze Exercises  X1 Viewing Horizontal;X1 Viewing Vertical  EPLEY MANUEVER RIGHT   Number of Reps   1    Overall Response  Symptoms Worsened    Response Details   Pt reported 7-8/10 dizziness in s/s after treatment but pt had difficulty performing L cx rotation 2/2 limited L cervical rotation      Semont Procedure Right Posterior   Number of Reps   2    Overall Response   Improved Symptoms    Response Details   pt reported dizziness reduced to 4-5/10 (for 1-2 seconds) after treatment. No nystagmus noted during Semont (after Epley treatment).      X1 Viewing Horizontal   Foot Position  Seated    Time  -- 20-30 sec.    Reps  2    Comments  Cues and demo for technique. Please see pt instructions for details.       X1 Viewing Vertical   Foot Position  Seated    Time  -- 20-30 sec.    Reps  2    Comments  See above.      Increased time in between treatment to allow wooziness and unsteadiness to subside per pt report.        PT Education - 12/06/16 1307    Education provided  Yes    Education Details  Reviewed x1 viewing HEP for technique. Discussed BPPV and limited L cx rotation.    Person(s) Educated  Patient    Methods  Explanation;Demonstration;Verbal cues;Handout    Comprehension  Verbalized understanding;Returned demonstration;Need further instruction       PT Short Term Goals - 12/03/16 0845      PT SHORT TERM GOAL #1   Title  Get baseline DHI and set LTG as appropriate. (Target all STGs 12/03/16)    Time  1    Period  Weeks    Status  Achieved      PT SHORT TERM GOAL #2   Title  Measure gait velocity and update LTG as appropriate.    Time  1    Period  Weeks    Status  Achieved        PT Long Term Goals - 12/03/16 0845      PT LONG TERM GOAL #1   Title  Patient will have no sense of movement with positional testing (negative tests). (Target all LTGs 12/20/16)    Time  4    Period  Weeks    Status  New      PT LONG TERM GOAL #2   Title  Patient will be independent with HEP.    Time  4    Period  Weeks    Status  New      PT LONG TERM GOAL #3   Title  Patient will improve gait velocity by >= 0.3 ft/sec as measure of improving balance and decreasing fall risk.    Time  4    Period  Weeks    Status  New      PT LONG TERM GOAL #4   Title  Pt will improve DHI score to </=48% to improve QOL.    Status  New            Plan - 12/06/16 1308    Clinical Impression Statement  Pt experienced s/s consistent with R pBPPV canalithiasis, which improved after treatment. Pt's L cx rotation limited and pt unable to perform Epley maneuver sufficiently, but tolerated R posterior Semont treatment well with reduction in dizziness and no nystagmus noted.  Continue with POC.     Clinical Impairments Affecting Rehab Potential  acute onset of symptoms; have been improving     PT  Frequency  2x / week    PT Duration  4 weeks    PT Treatment/Interventions  ADLs/Self Care Home Management;Canalith Repostioning;Gait training;Functional mobility training;Therapeutic activities;Therapeutic exercise;Balance training;Neuromuscular re-education;Patient/family education;Vestibular;Visual/perceptual remediation/compensation    PT Next Visit Plan  Check VOR and progress as indicated, add gait with head turns/nods to HEP. Reassess for R BPPV.    Consulted and Agree with Plan of Care  Patient;Family member/caregiver    Family Member Consulted  husband, [name]       Patient will benefit from skilled therapeutic intervention in order to improve the following deficits and impairments:  Abnormal gait, Decreased balance, Difficulty walking, Dizziness, Other (comment)  Visit Diagnosis: BPPV (benign paroxysmal positional vertigo), right  Dizziness and giddiness     Problem List Patient Active Problem List   Diagnosis Date Noted  . Morbid obesity (HCC) 05/13/2016  . OSA (obstructive sleep apnea) 02/27/2016  . Umbilical hernia without obstruction and without gangrene 11/04/2013  . Essential tremor 10/29/2012  . Cardiomyopathy (HCC) 04/05/2011  . Cyclitis, chronic 04/05/2011  . Degeneration macular 04/05/2011  . Migraine headache   . Dyspnea   . Hyperlipemia   . Chronic anticoagulation   . Chronic diastolic CHF (congestive heart failure) (HCC) 10/12/2009  . RESTLESS LEG SYNDROME 12/27/2006  . MIGRAINE, CHRONIC 12/27/2006  . ATRIAL FIBRILLATION 12/27/2006  . DYSPNEA 12/27/2006    Kue Fox L 12/06/2016, 1:10 PM  [facility] [address] [place], [place], [address] Phone: [phone]   Fax:  [phone]  Name: Leonia ReaderJacqueline F Vanalstyne MRN: [id number] Date of Birth: 03/20/1950  Zerita BoersJennifer Sweetie Giebler, PT,DPT 12/06/16 1:10 PM Phone: [phone] Fax: [phone]

## 2016-12-06 NOTE — Patient Instructions (Signed)
Gaze Stabilization: Tip Card  1.Target must remain in focus, not blurry, and appear stationary while head is in motion. 2.Perform exercises with small head movements (45 to either side of midline). 3.Increase speed of head motion so long as target is in focus. 4.If you wear eyeglasses, be sure you can see target through lens (therapist will give specific instructions for bifocal / progressive lenses). 5.These exercises may provoke dizziness or nausea. Work through these symptoms. If too dizzy, slow head movement slightly. Rest between each exercise. 6.Exercises demand concentration; avoid distractions.  Copyright  VHI. All rights reserved.     Special Instructions: Exercises may bring on mild to moderate symptoms of dizzy, nausea that resolve within 30 minutes of completing exercises. If symptoms are lasting longer than 30 minutes, modify your exercises by:  >decreasing the # of times you complete each activity >ensuring your symptoms return to baseline before moving onto the next exercise >dividing up exercises so you do not do them all in one session, but multiple short sessions throughout the day >doing them once a day until symptoms improve   Gaze Stabilization: Sitting    Keeping eyes on target on wall 3-4 feet away, TUCK CHIN, and move head side to side for _30___ seconds. Repeat while moving head up and down for __30__ seconds. Do __2-3__ sessions per day.  Copyright  VHI. All rights reserved.   

## 2016-12-10 ENCOUNTER — Ambulatory Visit: Payer: Medicare Other

## 2016-12-10 DIAGNOSIS — H8111 Benign paroxysmal vertigo, right ear: Secondary | ICD-10-CM | POA: Diagnosis not present

## 2016-12-10 DIAGNOSIS — R42 Dizziness and giddiness: Secondary | ICD-10-CM

## 2016-12-10 DIAGNOSIS — R2689 Other abnormalities of gait and mobility: Secondary | ICD-10-CM

## 2016-12-10 NOTE — Therapy (Signed)
[facility] [address] [place], [place], [address] Phone: [phone]   Fax:  [phone]  Physical Therapy Treatment  Patient Details  Name: [name] MRN: [id number] Date of Birth: [birth year] Referring Provider: Creola Corn MD   Encounter Date: 12/10/2016  PT End of Session - 12/10/16 1014    Visit Number  5    Number of Visits  9    Date for PT Re-Evaluation  01/24/17    Authorization Type  [employer] Medicare    Authorization Time Period  11/25/16 to 01/24/17    PT Start Time  0931    PT Stop Time  1012    PT Time Calculation (min)  41 min    Equipment Utilized During Treatment  -- S for safety    Activity Tolerance  Patient tolerated treatment well    Behavior During Therapy  [facility] for tasks assessed/performed       Past Medical History:  Diagnosis Date  . Back pain    nerve problems-had series of three injections  . Chronic anticoagulation   . Diastolic congestive heart failure (HCC)   . Dyspnea   . Hyperlipemia   . Migraine headache   . Paroxysmal atrial fibrillation (HCC)   . Restrictive cardiomyopathy (HCC)   . Tubal pregnancy     Past Surgical History:  Procedure Laterality Date  . BREAST LUMPECTOMY     benign  . CARDIAC CATHETERIZATION    . CARDIOVERSION    . CHOLECYSTECTOMY    . ECTOPIC PREGNANCY SURGERY  1980  . EYE SURGERY     left eye injections x2-was having "stroke like" flashes of light  . heart biopsy  2008   [facility]  . HYSTEROSCOPY  6/05   and D&C-simple hyperplasia secondary PMP bleeding  . ROTATOR CUFF REPAIR     bilateral    There were no vitals filed for this visit.  Subjective Assessment - 12/10/16 0933    Subjective  Pt denied falls since last visit. Pt reported she has been more dizzy since stopping the Meclizine but feels like overall things are improving. Pt worked outside yesterday without holding on much. Pt reported 4/10 dizziness at rest.     Pertinent  History  back pain, cardiomyopathy, CHF, dyspnea, migraine, PAF with anticoagulation, bil RCT, essential tremor    Patient Stated Goals  feel more "sure footed" when walking    Currently in Pain?  No/denies             Vestibular Assessment - 12/10/16 0936      Positional Testing   Sidelying Test  Sidelying Right;Sidelying Left      Sidelying Right   Sidelying Right Duration  < 30sec. with 7/10 dizziness.    Sidelying Right Symptoms  Upbeat, right rotatory nystagmus              [facility] Adult PT Treatment/Exercise - 12/10/16 1012      High Level Balance   High Level Balance Activities  Head turns;Other (comment) head nods    High Level Balance Comments  In // bars with S for safety and cues/demo for technique. Pt performed 4x10' without UE support, pt noted to experience incr. postural sway and incr. wooziness during head nods. Seated rest break to allow dizziness to subside.       Vestibular Treatment/Exercise - 12/10/16 0944      Vestibular Treatment/Exercise   Vestibular Treatment Provided  Canalith Repositioning;Gaze    Canalith Repositioning  Semont Procedure Right Posterior    Habituation Exercises  Austin Miles    Gaze Exercises  X1 Viewing Horizontal;X1 Viewing Vertical      Semont Procedure Right Posterior   Number of Reps   3    Overall Response   Improved Symptoms    Response Details   Pt reportd reduction in dizziness (5/10) after treatment.       Austin Miles   Number of Reps   1    Symptom Description   Pt reported decr. dizziness. Cues for technique. Please see pt instructions for details.      X1 Viewing Horizontal   Foot Position  Seated and standing with feet apart    Reps  4    Comments  4x30sec. with Cues and demo for technique. Please see pt instructions for details. Please see pt instructions for HEP details. Pt reported 5/10 wooziness. Pt reported wooziness subsided quickly to 4/10.       X1 Viewing Vertical   Foot Position  Seated and  standing    Reps  4    Comments  4x30sec. see above.             PT Education - 12/10/16 1013    Education provided  Yes    Education Details  Progressed x1 viewing to standing. Added sidelying habituation to HEP to reduce dizziness.     Person(s) Educated  Patient    Methods  Explanation;Demonstration;Verbal cues;Handout    Comprehension  Returned demonstration;Verbalized understanding       PT Short Term Goals - 12/03/16 0845      PT SHORT TERM GOAL #1   Title  Get baseline DHI and set LTG as appropriate. (Target all STGs 12/03/16)    Time  1    Period  Weeks    Status  Achieved      PT SHORT TERM GOAL #2   Title  Measure gait velocity and update LTG as appropriate.    Time  1    Period  Weeks    Status  Achieved        PT Long Term Goals - 12/03/16 0845      PT LONG TERM GOAL #1   Title  Patient will have no sense of movement with positional testing (negative tests). (Target all LTGs 12/20/16)    Time  4    Period  Weeks    Status  New      PT LONG TERM GOAL #2   Title  Patient will be independent with HEP.    Time  4    Period  Weeks    Status  New      PT LONG TERM GOAL #3   Title  Patient will improve gait velocity by >= 0.3 ft/sec as measure of improving balance and decreasing fall risk.    Time  4    Period  Weeks    Status  New      PT LONG TERM GOAL #4   Title  Pt will improve DHI score to </=48% to improve QOL.    Status  New            Plan - 12/10/16 1014    Clinical Impression Statement  Pt demonstrated progress, as she was able to tolerate gaze stab. HEP from seated to standing. Pt also reported she has not taken Meclizine since last visit and was able to perform dynamic activities outdoors yesterday without incr. in dizziness. Pt continues  to experience s/s consistent with R pBPPV but treatment is challenging 2/2 decr. neck ROM, PT provided pt with habituation to decr. dizziness. Continue with POC.     Clinical Impairments  Affecting Rehab Potential  acute onset of symptoms; have been improving     PT Frequency  2x / week    PT Duration  4 weeks    PT Treatment/Interventions  ADLs/Self Care Home Management;Canalith Repostioning;Gait training;Functional mobility training;Therapeutic activities;Therapeutic exercise;Balance training;Neuromuscular re-education;Patient/family education;Vestibular;Visual/perceptual remediation/compensation    PT Next Visit Plan  add gait with head turns/nods to HEP. Reassess for R BPPV.    Consulted and Agree with Plan of Care  Patient;Family member/caregiver    Family Member Consulted  husband, [name]       Patient will benefit from skilled therapeutic intervention in order to improve the following deficits and impairments:  Abnormal gait, Decreased balance, Difficulty walking, Dizziness, Other (comment)  Visit Diagnosis: BPPV (benign paroxysmal positional vertigo), right  Dizziness and giddiness  Other abnormalities of gait and mobility     Problem List Patient Active Problem List   Diagnosis Date Noted  . Morbid obesity (HCC) 05/13/2016  . OSA (obstructive sleep apnea) 02/27/2016  . Umbilical hernia without obstruction and without gangrene 11/04/2013  . Essential tremor 10/29/2012  . Cardiomyopathy (HCC) 04/05/2011  . Cyclitis, chronic 04/05/2011  . Degeneration macular 04/05/2011  . Migraine headache   . Dyspnea   . Hyperlipemia   . Chronic anticoagulation   . Chronic diastolic CHF (congestive heart failure) (HCC) 10/12/2009  . RESTLESS LEG SYNDROME 12/27/2006  . MIGRAINE, CHRONIC 12/27/2006  . ATRIAL FIBRILLATION 12/27/2006  . DYSPNEA 12/27/2006    Arabia Nylund L 12/10/2016, 10:17 AM  [facility] [address] [place], [place], [address] Phone: [phone]   Fax:  [phone]  Name: Leonia ReaderJacqueline F Teem MRN: [id number] Date of Birth: Jan 16, 1951  Zerita BoersJennifer Binta Statzer, PT,DPT 12/10/16 10:18  AM Phone: [phone] Fax: [phone]

## 2016-12-10 NOTE — Patient Instructions (Signed)
Gaze Stabilization: Tip Card  1.Target must remain in focus, not blurry, and appear stationary while head is in motion. 2.Perform exercises with small head movements (45 to either side of midline). 3.Increase speed of head motion so long as target is in focus. 4.If you wear eyeglasses, be sure you can see target through lens (therapist will give specific instructions for bifocal / progressive lenses). 5.These exercises may provoke dizziness or nausea. Work through these symptoms. If too dizzy, slow head movement slightly. Rest between each exercise. 6.Exercises demand concentration; avoid distractions. 7.For safety, perform standing exercises close to a counter, wall, corner, or next to someone.  Copyright  VHI. All rights reserved.   Gaze Stabilization: Standing Feet Apart    Feet shoulder width apart, keeping eyes on target on wall __3-5__ feet away, tilt head down 15-30 and move head side to side for _20-30___ seconds. Repeat while moving head up and down for _20-30___ seconds. Do __2__ sessions per day.  Copyright  VHI. All rights reserved.   Sit to Side-Lying    Sit on edge of bed. 1. Turn head 45 to right. 2. Maintain head position and lie down slowly on left side. Hold until symptoms subside, plus 30 seconds. 3. Sit up slowly. Hold until symptoms subside. 4. Turn head 45 to left. 5. Maintain head position and lie down slowly on right side. Hold until symptoms subside, plus 30 seconds.. 6. Sit up slowly. Repeat sequence __3__ times per session. Do __1__ sessions per day.  Copyright  VHI. All rights reserved.

## 2016-12-16 ENCOUNTER — Ambulatory Visit: Payer: Medicare Other

## 2016-12-18 ENCOUNTER — Other Ambulatory Visit: Payer: Self-pay | Admitting: Cardiovascular Disease

## 2016-12-19 ENCOUNTER — Ambulatory Visit: Payer: Medicare Other | Admitting: Physical Therapy

## 2016-12-23 ENCOUNTER — Ambulatory Visit: Payer: Medicare Other | Attending: Internal Medicine | Admitting: Physical Therapy

## 2016-12-23 ENCOUNTER — Encounter: Payer: Self-pay | Admitting: Physical Therapy

## 2016-12-23 DIAGNOSIS — H8111 Benign paroxysmal vertigo, right ear: Secondary | ICD-10-CM | POA: Diagnosis present

## 2016-12-23 DIAGNOSIS — R2689 Other abnormalities of gait and mobility: Secondary | ICD-10-CM

## 2016-12-23 DIAGNOSIS — R42 Dizziness and giddiness: Secondary | ICD-10-CM | POA: Insufficient documentation

## 2016-12-23 NOTE — Patient Instructions (Signed)
Rolling    With pillow under head, start on back. Roll to right. Hold position until symptoms subside plus 20 seconds. Roll  onto left side. Hold position until symptoms subside plus 20 seconds. Repeat sequence __3__ times per session. Do __1__ sessions per day.    **Do you "E" exercise in standing twice per day. Once per day do the sidelying exercise and the other time do the rolling exercise (above).  Copyright  VHI. All rights reserved.

## 2016-12-23 NOTE — Therapy (Signed)
[facility] [address] [place], [place], [address] Phone: [phone]   Fax:  [phone]  Physical Therapy Treatment  Patient Details  Name: [name] MRN: [id number] Date of Birth: [birth year] Referring Provider: Creola Cornusso, [name] MD   Encounter Date: 12/23/2016  PT End of Session - 12/23/16 0902    Visit Number  6 next G-code visit 16 (done 12/3)    Number of Visits  17 re-cert 69/612/3    Date for PT Re-Evaluation  02/21/17    Authorization Type  [employer]HC Medicare    Authorization Time Period  11/25/16 to 01/24/17;  12/23/16-02/21/2017    PT Start Time  0800    PT Stop Time  0850    PT Time Calculation (min)  50 min    Equipment Utilized During Treatment  -- S for safety    Activity Tolerance  Patient tolerated treatment well    Behavior During Therapy  [facility] for tasks assessed/performed       Past Medical History:  Diagnosis Date  . Back pain    nerve problems-had series of three injections  . Chronic anticoagulation   . Diastolic congestive heart failure (HCC)   . Dyspnea   . Hyperlipemia   . Migraine headache   . Paroxysmal atrial fibrillation (HCC)   . Restrictive cardiomyopathy (HCC)   . Tubal pregnancy     Past Surgical History:  Procedure Laterality Date  . BREAST LUMPECTOMY     benign  . CARDIAC CATHETERIZATION    . CARDIOVERSION    . CHOLECYSTECTOMY    . ECTOPIC PREGNANCY SURGERY  1980  . EYE SURGERY     left eye injections x2-was having "stroke like" flashes of light  . [facility]  . HYSTEROSCOPY  6/05   and D&C-simple hyperplasia secondary PMP bleeding  . ROTATOR CUFF REPAIR     bilateral    There were no vitals filed for this visit.  Subjective Assessment - 12/23/16 0801    Subjective  Had a little setback over Thanksgiving. I had a cold and my spinning got worse. I still get it when I roll over to my right and when I get up from my right, but not as bad. While I was  sick I couldn't do the exercises. Things have been getting better.     Pertinent History  back pain, cardiomyopathy, CHF, dyspnea, migraine, PAF with anticoagulation, bil RCT, essential tremor    Patient Stated Goals  feel more "sure footed" when walking    Currently in Pain?  No/denies             Vestibular Assessment - 12/23/16 0854      Vestibular Assessment   General Observation  walking in straight path, good velocity (3.7 ft/sec)      Symptom Behavior   Type of Dizziness  Lightheadedness primarily, but spinning when rolls    Frequency of Dizziness  daily    Duration of Dizziness  seconds (spinning); minute (lightheaded)    Aggravating Factors  Supine to sit;Rolling to right;Rolling to left    Relieving Factors  Closing eyes      Positional Testing   Dix-Hallpike  Dix-Hallpike Right;Dix-Hallpike Left    Sidelying Test  Sidelying Right;Sidelying Left      Dix-Hallpike Right   Dix-Hallpike Right Duration  0    Dix-Hallpike Right Symptoms  No nystagmus coming up +lightheaded      Dix-Hallpike Left   Dix-Hallpike  Left Duration  0    Dix-Hallpike Left Symptoms  No nystagmus coming up +lightheaded      Sidelying Right   Sidelying Right Duration  0    Sidelying Right Symptoms  No nystagmus      Sidelying Left   Sidelying Left Duration  0    Sidelying Left Symptoms  No nystagmus      Positional Sensitivities   Rolling Right  Lightheadedness    Rolling Left  Lightheadedness              [facility] Adult PT Treatment/Exercise - 12/23/16 1914      Ambulation/Gait   Gait velocity  32.8/8.79=3.73      Vestibular Treatment/Exercise - 12/23/16 0001      Vestibular Treatment/Exercise   Vestibular Treatment Provided  Habituation;Gaze    Habituation Exercises  Francee Piccolo Daroff;Horizontal Roll    Gaze Exercises  X1 Viewing Horizontal;X1 Viewing Vertical      Austin Miles   Number of Reps   2    Symptom Description   no spinning; lightheaded upon sitting       Horizontal Roll   Number of Reps   1    Symptom Description   lightheaded      X1 Viewing Horizontal   Foot Position  seated, back unsupported; standing, feet apart    Time  -- 30 sec    Reps  1 each position    Comments  vc for chin tuck and to incr speed rt/lt      X1 Viewing Vertical   Foot Position  seated, back unsupported; standing, feet apart    Time  -- 30 sec    Reps  1 each position            PT Education - 12/23/16 0901    Education provided  Yes    Education Details  see addition to HEP;     Person(s) Educated  Patient    Methods  Explanation;Demonstration;Handout    Comprehension  Verbalized understanding;Returned demonstration;Need further instruction       PT Short Term Goals - 12/03/16 0845      PT SHORT TERM GOAL #1   Title  Get baseline DHI and set LTG as appropriate. (Target all STGs 12/03/16)    Time  1    Period  Weeks    Status  Achieved      PT SHORT TERM GOAL #2   Title  Measure gait velocity and update LTG as appropriate.    Time  1    Period  Weeks    Status  Achieved        PT Long Term Goals - 12/23/16 0902      PT LONG TERM GOAL #1   Title  Patient will have no sense of movement with positional testing (negative tests). (Target all LTGs 12/20/16) (New Target for ongoing and new goals 01/22/17)    Baseline  12/3 describes lightheadedness, no movement    Time  4    Period  Weeks    Status  Achieved    Target Date  01/22/17      PT LONG TERM GOAL #2   Title  Patient will be independent with HEP.    Baseline  12/3 has been sick x 1 week and not done recently; reviewed with min cues needed    Time  4    Period  Weeks    Status  On-going      PT LONG  TERM GOAL #3   Title  Patient will improve gait velocity by >= 0.3 ft/sec as measure of improving balance and decreasing fall risk.    Baseline  13-Jan-2023 3.7 (up from 3.3)    Time  4    Period  Weeks    Status  Achieved      PT LONG TERM GOAL #4   Title  Pt will improve DHI score  to </=48% to improve QOL.    Baseline  01-13-2023 52%    Status  On-going      PT LONG TERM GOAL #5   Title  Patient will improve FGA score to 26/30 demonstrating improved balance.     Baseline  23/30    Time  4    Period  Weeks    Status  New            Plan - 01/12/2017 0902    Clinical Impression Statement  LTGs assessed will pt meeting 2 of 4 goals, made progress towards 1 goal (but not to goal level), and did not meet 1 goal. Patient had a minor setback with ?viral illness x 1 week and was not well enough to do her HEP (due to vomiting). Patient reports she is doing better and knows she is because she is no longer having to reach for furniture to hold onto when she first gets OOB. She reports she continues to have spinning dizziness with rolling (although not elicited today). She agrees with recertification for an additional 4 weeks to address continued balance and vestibular issues.     Rehab Potential  Good    Clinical Impairments Affecting Rehab Potential  acute onset of symptoms; have been improving     PT Frequency  2x / week    PT Duration  4 weeks    PT Treatment/Interventions  ADLs/Self Care Home Management;Canalith Repostioning;Gait training;Functional mobility training;Therapeutic activities;Therapeutic exercise;Balance training;Neuromuscular re-education;Patient/family education;Vestibular;Visual/perceptual remediation/compensation    PT Next Visit Plan  progress VOR x 1; ?add corner balance ex's; add gait with head turns/nods to HEP.     Consulted and Agree with Plan of Care  Patient    Family Member Consulted  --       Patient will benefit from skilled therapeutic intervention in order to improve the following deficits and impairments:  Abnormal gait, Decreased balance, Difficulty walking, Dizziness, Other (comment)  Visit Diagnosis: BPPV (benign paroxysmal positional vertigo), right - Plan: PT plan of care cert/re-cert  Dizziness and giddiness - Plan: PT plan of care  cert/re-cert  Other abnormalities of gait and mobility - Plan: PT plan of care cert/re-cert   G-Codes - 01/12/17 0914    Functional Assessment Tool Used (Outpatient Only)  DHI 52%; positional testing now negative    Functional Limitation  Changing and maintaining body position    Changing and Maintaining Body Position Current Status (Z6109)  At least 20 percent but less than 40 percent impaired, limited or restricted    Changing and Maintaining Body Position Goal Status (U0454)  At least 1 percent but less than 20 percent impaired, limited or restricted       Problem List Patient Active Problem List   Diagnosis Date Noted  . Morbid obesity (HCC) 05/13/2016  . OSA (obstructive sleep apnea) 02/27/2016  . Umbilical hernia without obstruction and without gangrene 11/04/2013  . Essential tremor 10/29/2012  . Cardiomyopathy (HCC) 04/05/2011  . Cyclitis, chronic 04/05/2011  . Degeneration macular 04/05/2011  . Migraine headache   . Dyspnea   .  Hyperlipemia   . Chronic anticoagulation   . Chronic diastolic CHF (congestive heart failure) (HCC) 10/12/2009  . RESTLESS LEG SYNDROME 12/27/2006  . MIGRAINE, CHRONIC 12/27/2006  . ATRIAL FIBRILLATION 12/27/2006  . DYSPNEA 12/27/2006    Zena Amos, PT 12/23/2016, 9:20 AM  [facility] [address] [place], [place], [address] Phone: [phone]   Fax:  [phone]  Name: [name] MRN: [id number] Date of Birth: Oct 02, 1950

## 2016-12-25 ENCOUNTER — Ambulatory Visit: Payer: Medicare Other | Admitting: Physical Therapy

## 2016-12-25 ENCOUNTER — Encounter: Payer: Self-pay | Admitting: Physical Therapy

## 2016-12-25 DIAGNOSIS — R42 Dizziness and giddiness: Secondary | ICD-10-CM

## 2016-12-25 DIAGNOSIS — H8111 Benign paroxysmal vertigo, right ear: Secondary | ICD-10-CM | POA: Diagnosis not present

## 2016-12-25 DIAGNOSIS — R2689 Other abnormalities of gait and mobility: Secondary | ICD-10-CM

## 2016-12-25 NOTE — Patient Instructions (Signed)
  Stand in a corner, without touching the walls and with a chair back in front of you.   Feet Partial Heel-Toe (Compliant Surface) Head Motion - Eyes Open    With eyes open, standing on compliant surface:  right foot partially in front of the other, move head slowly: up and down x 10. Be sure to focus on a target. Then left and right x 10.  Then with head still, close eyes with goal of 30 seconds.    Do __1-2__ sessions per day.  Copyright  VHI. All rights reserved.   Toe / Heel Raise    Gently rock back on heels and raise toes. Then rock forward on toes and raise heels and hold for 3 seconds.  Repeat sequence ___10_ times per session. Do __1-2__ sessions per day.  Copyright  VHI. All rights reserved.

## 2016-12-25 NOTE — Therapy (Signed)
[facility] [address] [place], [place], [address] Phone: [phone]   Fax:  [phone]  Physical Therapy Treatment  Patient Details  Name: [name] MRN: [id number] Date of Birth: 1950-05-04 Referring Provider: Creola Corn MD   Encounter Date: 12/25/2016  PT End of Session - 12/25/16 1751    Visit Number  7 next G-code visit 16 (done 12/3)    Number of Visits  17 re-cert 69/6    Date for PT Re-Evaluation  02/21/17    Authorization Type  [employer] Medicare    Authorization Time Period  11/25/16 to 01/24/17;  12/23/16-02/21/2017    PT Start Time  0848    PT Stop Time  0931    PT Time Calculation (min)  43 min    Equipment Utilized During Treatment  -- S for safety    Activity Tolerance  Patient tolerated treatment well    Behavior During Therapy  [facility] for tasks assessed/performed       Past Medical History:  Diagnosis Date  . Back pain    nerve problems-had series of three injections  . Chronic anticoagulation   . Diastolic congestive heart failure (HCC)   . Dyspnea   . Hyperlipemia   . Migraine headache   . Paroxysmal atrial fibrillation (HCC)   . Restrictive cardiomyopathy (HCC)   . Tubal pregnancy     Past Surgical History:  Procedure Laterality Date  . BREAST LUMPECTOMY     benign  . CARDIAC CATHETERIZATION    . CARDIOVERSION    . CHOLECYSTECTOMY    . ECTOPIC PREGNANCY SURGERY  1980  . EYE SURGERY     left eye injections x2-was having "stroke like" flashes of light  . heart biopsy  2008   [facility]  . HYSTEROSCOPY  6/05   and D&C-simple hyperplasia secondary PMP bleeding  . ROTATOR CUFF REPAIR     bilateral    There were no vitals filed for this visit.  Subjective Assessment - 12/25/16 0848    Subjective  I can't get my energy back. The only spinning I'm having is rolling from left to right, it's lasting much less. Still have some imbalance when turning, upon sit to stand, and able to  self-correct without external support.     Pertinent History  back pain, cardiomyopathy, CHF, dyspnea, migraine, PAF with anticoagulation, bil RCT, essential tremor    Patient Stated Goals  feel more "sure footed" when walking    Currently in Pain?  No/denies                           Balance Exercises - 12/25/16 1748      Balance Exercises: Standing   SLS  Eyes open;Solid surface;1 rep;30 secs each leg    Rockerboard  Anterior/posterior;Head turns;EO;EC;Intermittent UE support    Tandem Gait  Forward;Retro;4 reps    Retro Gait  2 reps    Heel Raises Limitations  hold 3 sec x 15    Toe Raise Limitations  alternated with heel raise      OTAGO PROGRAM   Toe Walk  No support      See below exercises added to HEP. Performed per instructions to assess appropriate level of difficulty.    Stand in a corner, without touching the walls and with a chair back in front of you.   Feet Partial Heel-Toe (Compliant Surface) Head Motion - Eyes Open    With eyes  open, standing on compliant surface:  right foot partially in front of the other, move head slowly: up and down x 10. Be sure to focus on a target. Then left and right x 10.  Then with head still, close eyes with goal of 30 seconds.    Do __1-2__ sessions per day.  Copyright  VHI. All rights reserved.   Toe / Heel Raise    Gently rock back on heels and raise toes. Then rock forward on toes and raise heels and hold for 3 seconds.  Repeat sequence ___10_ times per session. Do __1-2__ sessions per day.  Copyright  VHI. All rights reserved.        PT Education - 12/25/16 1751    Education provided  Yes    Education Details  see addition to HEP    Person(s) Educated  Patient    Methods  Explanation;Demonstration;Handout    Comprehension  Verbalized understanding;Returned demonstration       PT Short Term Goals - 12/03/16 0845      PT SHORT TERM GOAL #1   Title  Get baseline DHI and set LTG as  appropriate. (Target all STGs 12/03/16)    Time  1    Period  Weeks    Status  Achieved      PT SHORT TERM GOAL #2   Title  Measure gait velocity and update LTG as appropriate.    Time  1    Period  Weeks    Status  Achieved        PT Long Term Goals - 12/23/16 0902      PT LONG TERM GOAL #1   Title  Patient will have no sense of movement with positional testing (negative tests). (Target all LTGs 12/20/16) (New Target for ongoing and new goals 01/22/17)    Baseline  12/3 describes lightheadedness, no movement    Time  4    Period  Weeks    Status  Achieved    Target Date  01/22/17      PT LONG TERM GOAL #2   Title  Patient will be independent with HEP.    Baseline  12/3 has been sick x 1 week and not done recently; reviewed with min cues needed    Time  4    Period  Weeks    Status  On-going      PT LONG TERM GOAL #3   Title  Patient will improve gait velocity by >= 0.3 ft/sec as measure of improving balance and decreasing fall risk.    Baseline  12/3 3.7 (up from 3.3)    Time  4    Period  Weeks    Status  Achieved      PT LONG TERM GOAL #4   Title  Pt will improve DHI score to </=48% to improve QOL.    Baseline  12/3 52%    Status  On-going      PT LONG TERM GOAL #5   Title  Patient will improve FGA score to 26/30 demonstrating improved balance.     Baseline  23/30    Time  4    Period  Weeks    Status  New            Plan - 12/25/16 1753    Clinical Impression Statement  Session focused on adding balance exercises to HEP and further balance training with PT gym equipment. Patient very pleased with her progress and will continue to benefit  from PT.    Rehab Potential  Good    Clinical Impairments Affecting Rehab Potential  acute onset of symptoms; have been improving     PT Frequency  2x / week    PT Duration  4 weeks    PT Treatment/Interventions  ADLs/Self Care Home Management;Canalith Repostioning;Gait training;Functional mobility  training;Therapeutic activities;Therapeutic exercise;Balance training;Neuromuscular re-education;Patient/family education;Vestibular;Visual/perceptual remediation/compensation    PT Next Visit Plan  check ex's added to HEP 12/5; ? progress VOR x 1 for HEP; ? add gait with head turns/nods to HEP.     Consulted and Agree with Plan of Care  Patient       Patient will benefit from skilled therapeutic intervention in order to improve the following deficits and impairments:  Abnormal gait, Decreased balance, Difficulty walking, Dizziness, Other (comment)  Visit Diagnosis: Dizziness and giddiness  Other abnormalities of gait and mobility     Problem List Patient Active Problem List   Diagnosis Date Noted  . Morbid obesity (HCC) 05/13/2016  . OSA (obstructive sleep apnea) 02/27/2016  . Umbilical hernia without obstruction and without gangrene 11/04/2013  . Essential tremor 10/29/2012  . Cardiomyopathy (HCC) 04/05/2011  . Cyclitis, chronic 04/05/2011  . Degeneration macular 04/05/2011  . Migraine headache   . Dyspnea   . Hyperlipemia   . Chronic anticoagulation   . Chronic diastolic CHF (congestive heart failure) (HCC) 10/12/2009  . RESTLESS LEG SYNDROME 12/27/2006  . MIGRAINE, CHRONIC 12/27/2006  . ATRIAL FIBRILLATION 12/27/2006  . DYSPNEA 12/27/2006    Zena AmosLynn P Daran Favaro, PT 12/25/2016, 5:56 PM  [facility] [address] [place], [place], [address] Phone: [phone]   Fax:  [phone]  Name: [name] MRN: [id number] Date of Birth: May 18, 1950

## 2016-12-30 ENCOUNTER — Ambulatory Visit: Payer: Medicare Other | Admitting: Physical Therapy

## 2017-01-01 ENCOUNTER — Ambulatory Visit: Payer: Medicare Other | Admitting: Physical Therapy

## 2017-01-01 ENCOUNTER — Encounter: Payer: Medicare Other | Admitting: Physical Therapy

## 2017-01-06 ENCOUNTER — Encounter: Payer: Self-pay | Admitting: Physical Therapy

## 2017-01-06 ENCOUNTER — Ambulatory Visit: Payer: Medicare Other | Admitting: Physical Therapy

## 2017-01-06 DIAGNOSIS — R42 Dizziness and giddiness: Secondary | ICD-10-CM

## 2017-01-06 DIAGNOSIS — R2689 Other abnormalities of gait and mobility: Secondary | ICD-10-CM

## 2017-01-06 DIAGNOSIS — H8111 Benign paroxysmal vertigo, right ear: Secondary | ICD-10-CM | POA: Diagnosis not present

## 2017-01-06 NOTE — Therapy (Signed)
[facility] [address] [place], [place], [address] Phone: [phone]   Fax:  [phone]  Physical Therapy Treatment  Patient Details  Name: [name] ReaderJacqueline F Vowles MRN: [id number] Date of Birth: October 22, 1950 Referring Provider: Creola Cornusso, [name] MD   Encounter Date: 01/06/2017  PT End of Session - 01/06/17 1312    Visit Number  8 next G-code visit 16 (done 12/3)    Number of Visits  17 re-cert 84/112/3    Date for PT Re-Evaluation  02/21/17    Authorization Type  [employer] Medicare    Authorization Time Period  11/25/16 to 01/24/17;  12/23/16-02/21/2017    PT Start Time  0800    PT Stop Time  0844    PT Time Calculation (min)  44 min    Equipment Utilized During Treatment  Gait belt S for safety    Activity Tolerance  Patient tolerated treatment well    Behavior During Therapy  [facility] for tasks assessed/performed       Past Medical History:  Diagnosis Date  . Back pain    nerve problems-had series of three injections  . Chronic anticoagulation   . Diastolic congestive heart failure (HCC)   . Dyspnea   . Hyperlipemia   . Migraine headache   . Paroxysmal atrial fibrillation (HCC)   . Restrictive cardiomyopathy (HCC)   . Tubal pregnancy     Past Surgical History:  Procedure Laterality Date  . BREAST LUMPECTOMY     benign  . CARDIAC CATHETERIZATION    . CARDIOVERSION    . CHOLECYSTECTOMY    . ECTOPIC PREGNANCY SURGERY  1980  . EYE SURGERY     left eye injections x2-was having "stroke like" flashes of light  . [facility]  [facility]  . HYSTEROSCOPY  6/05   and D&C-simple hyperplasia secondary PMP bleeding  . ROTATOR CUFF REPAIR     bilateral    There were no vitals filed for this visit.  Subjective Assessment - 01/06/17 0803    Subjective  I'm not noticiing it as much now when I lie down. The worst is watching TV with fast movements or bright lights. Rolling in bed still a problem.     Pertinent History  back pain,  cardiomyopathy, CHF, dyspnea, migraine, PAF with anticoagulation, bil RCT, essential tremor    Patient Stated Goals  feel more "sure footed" when walking    Currently in Pain?  No/denies                       Vestibular Treatment/Exercise - 01/06/17 1259      Vestibular Treatment/Exercise   Vestibular Treatment Provided  Habituation;Gaze    Habituation Exercises  180 degree Turns      180 degree Turns   Number of Reps   10    Symptom Description   maybe 3/10;       X1 Viewing Horizontal   Foot Position  standing feet together; partial tandem    Time  -- 60 seconds    Reps  3    Comments  able to do in stanidng with plain background; able to do with E on posture grid in standing at 10 ft      X1 Viewing Vertical   Foot Position  standing feet together; partial tandem    Time  -- 60 sec    Reps  1    Comments  with posture grid as background  Balance Exercises - 01/06/17 1308      Balance Exercises: Standing   Standing Eyes Closed  Narrow base of support (BOS);Solid surface;1 rep 60    Rockerboard  Anterior/posterior;Head turns;EO;EC;30 seconds step off/on ant &post    Gait with Head Turns  Forward each diagonal; horizontal; vertical     Other Standing Exercises  BOSU x 60 sec EO, head turns each way x 10 reps; no UE support        PT Education - 01/06/17 1312    Education provided  Yes    Education Details  updated HEP    Person(s) Educated  Patient    Methods  Explanation;Demonstration;Handout    Comprehension  Verbalized understanding;Returned demonstration       PT Short Term Goals - 12/03/16 0845      PT SHORT TERM GOAL #1   Title  Get baseline DHI and set LTG as appropriate. (Target all STGs 12/03/16)    Time  1    Period  Weeks    Status  Achieved      PT SHORT TERM GOAL #2   Title  Measure gait velocity and update LTG as appropriate.    Time  1    Period  Weeks    Status  Achieved        PT Long Term Goals - 12/23/16  0902      PT LONG TERM GOAL #1   Title  Patient will have no sense of movement with positional testing (negative tests). (Target all LTGs 12/20/16) (New Target for ongoing and new goals 01/22/17)    Baseline  12/3 describes lightheadedness, no movement    Time  4    Period  Weeks    Status  Achieved    Target Date  01/22/17      PT LONG TERM GOAL #2   Title  Patient will be independent with HEP.    Baseline  12/3 has been sick x 1 week and not done recently; reviewed with min cues needed    Time  4    Period  Weeks    Status  On-going      PT LONG TERM GOAL #3   Title  Patient will improve gait velocity by >= 0.3 ft/sec as measure of improving balance and decreasing fall risk.    Baseline  12/3 3.7 (up from 3.3)    Time  4    Period  Weeks    Status  Achieved      PT LONG TERM GOAL #4   Title  Pt will improve DHI score to </=48% to improve QOL.    Baseline  12/3 52%    Status  On-going      PT LONG TERM GOAL #5   Title  Patient will improve FGA score to 26/30 demonstrating improved balance.     Baseline  23/30    Time  4    Period  Weeks    Status  New            Plan - 01/06/17 1313    Clinical Impression Statement  Session focused on updating VOR x 1 exercise with pt demonstrating great progress as she has worked on this at home. Patient with symptoms at most 3/10 during session and went away almost as soon as she stopped the activity. She is making excellent progress and anticipate she will soon be ready for discharge.     Rehab Potential  Good  Clinical Impairments Affecting Rehab Potential  acute onset of symptoms; have been improving     PT Frequency  2x / week    PT Duration  4 weeks    PT Treatment/Interventions  ADLs/Self Care Home Management;Canalith Repostioning;Gait training;Functional mobility training;Therapeutic activities;Therapeutic exercise;Balance training;Neuromuscular re-education;Patient/family education;Vestibular;Visual/perceptual  remediation/compensation    PT Next Visit Plan  check-in to see how VOR x 1 with letter on TV screen went; work on activities with movement and head turns, visual scanning (? scavenger hunt for deck of cards)    Consulted and Agree with Plan of Care  Patient       Patient will benefit from skilled therapeutic intervention in order to improve the following deficits and impairments:  Abnormal gait, Decreased balance, Difficulty walking, Dizziness, Other (comment)  Visit Diagnosis: Dizziness and giddiness  Other abnormalities of gait and mobility     Problem List Patient Active Problem List   Diagnosis Date Noted  . Morbid obesity (HCC) 05/13/2016  . OSA (obstructive sleep apnea) 02/27/2016  . Umbilical hernia without obstruction and without gangrene 11/04/2013  . Essential tremor 10/29/2012  . Cardiomyopathy (HCC) 04/05/2011  . Cyclitis, chronic 04/05/2011  . Degeneration macular 04/05/2011  . Migraine headache   . Dyspnea   . Hyperlipemia   . Chronic anticoagulation   . Chronic diastolic CHF (congestive heart failure) (HCC) 10/12/2009  . RESTLESS LEG SYNDROME 12/27/2006  . MIGRAINE, CHRONIC 12/27/2006  . ATRIAL FIBRILLATION 12/27/2006  . DYSPNEA 12/27/2006    Zena Amos, PT 01/06/2017, 1:17 PM  [facility] [address] [place], [place], [address] Phone: [phone]   Fax:  [phone]  Name: WESTON FULCO MRN: [id number] Date of Birth: 1950-06-14

## 2017-01-06 NOTE — Patient Instructions (Addendum)
  Try your "E" exercise seated with target on your TV screen. Aim for 60 seconds. If too symptomatic, try moving closer to the TV. If able to do 60 seconds seated, try a channel with a scroll on the screen. Next try standing.

## 2017-01-08 ENCOUNTER — Ambulatory Visit: Payer: Medicare Other | Admitting: Cardiovascular Disease

## 2017-01-08 ENCOUNTER — Other Ambulatory Visit: Payer: Self-pay | Admitting: *Deleted

## 2017-01-08 ENCOUNTER — Encounter: Payer: Self-pay | Admitting: Cardiovascular Disease

## 2017-01-08 VITALS — BP 118/64 | HR 62 | Ht 69.0 in | Wt 238.4 lb

## 2017-01-08 DIAGNOSIS — Z79899 Other long term (current) drug therapy: Secondary | ICD-10-CM | POA: Diagnosis not present

## 2017-01-08 DIAGNOSIS — Z5181 Encounter for therapeutic drug level monitoring: Secondary | ICD-10-CM

## 2017-01-08 DIAGNOSIS — E785 Hyperlipidemia, unspecified: Secondary | ICD-10-CM

## 2017-01-08 DIAGNOSIS — I5032 Chronic diastolic (congestive) heart failure: Secondary | ICD-10-CM | POA: Diagnosis not present

## 2017-01-08 DIAGNOSIS — I480 Paroxysmal atrial fibrillation: Secondary | ICD-10-CM | POA: Diagnosis not present

## 2017-01-08 MED ORDER — EZETIMIBE 10 MG PO TABS: 10.0000 mg | ORAL_TABLET | Freq: Every day | ORAL | 3 refills | Status: DC

## 2017-01-08 NOTE — Progress Notes (Signed)
Cardiology Office Note   Date:  01/08/2017   ID:  [name], DOB 1950/10/14, MRN [id number]  PCP:  Creola Corn, MD  Cardiologist:   Kristeen Miss, MD   Chief Complaint  Patient presents with  . Congestive Heart Failure   1. Atrial fibrillation 2. Hyperlipidemia 3. Diastolic congestive heart failure    66 year old female with a history of atrial fibrillation. She's been well controlled on Tikosyn. She also has a history of hyperlipidemia and is on Lipitor. She's been on chronic Pradaxa therapy for anticoagulation.  February 20, 2012 [name] has done well since I last saw her. 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 She has occasional palpitations but overall she's doing quite well. She is exercising ( water aerobics) twice a week.  Feb. 24, 2015:  [name] is doing well. No problems. Maintaining NSR.  Sept. 16, 2015:  She was in the ER this past week. CP, dyspnea .. CT angio was negative for PE - showed ? Of pneumonia. Was treated wit Abx. Does well in the am's .[name] and perhaps more short of breath in the afternoons.   April 05, 2014: [name] is a 66 y.o. female who presents for follow-up of her atrial fibrillation. She has continued to have some DOE,  Is concerned about the atorvastatin. Was seen in the lipid clinic.    Sept. 30, 2016:  Doing well.  No CP , no dyspnea. Has lost 11 lbs since last year.  Is in NSR  April 10, 2015:  [name] is doing ok from a cardiology standpoint. Has had some dental issues   Sept.  22, 2017:  [name] is seen for follow up of her atrial fib.    No  CP or dyspnea. No further episodes of atrial fib.   Dec. 11, 2017:  [name] is seen today for followup for her atrial fib  Has some DOE  Not exercising as much as she needs to  No CP   Jan. 16, 2018: [name] is seen today as a work in visit at the request of Dr. Timothy Lasso. Been having more shortness of breath and was thought  to have worsening congestive heart failure.  Has severe DOE with any exertion. We performed a cardiac cath in 2008 which revealed normal coronaries.   She went to [facility] and had a heart biopsy.   Was not told of any abnormality.  Does have dull chest pain with exertion and with sitting   She had an echocardiogram performed yesterday which revealed normal left ventricular systolic function. She does have grade 2 diastolic dysfunction. Her Lasix was increased last week.   She has not noticed any increase in urine output.  No change in her breathing .  Has noticed that she loses her breath if she does any exertion .  Cannot lay supine - has severe dyspnea.   April 02, 2016:  [name] is seen today for follow up visit .    Seen with husband , [name]  She had an episode of PAF yesterday  - lasted for several hours.   Is back in NSR currently .  Had an episode in Feb. That lasted 2-3 hours , HR of 129 ,  She was very short of breath with that.   -Sleep study on Feb. 20 shows mild OSA -PFTs 01/2016 showed ratio 72, FVC 74%, FEV1 69%, TLC 91% and DLCO 73% suggesting moderate intraparenchymal restriction  - echo shows normal LV  systolic function with grade 2 diastolic dysfunction. She has an estimated PA pressure of 35.  Has developed a tremor.   Is seeing a neurologist .   July 10, 2016:  Had a CPX in March , Normal functional capacity compared to sedentary patients.  Still has some vocal issues ( when she gets fatigued, she loses her voice) struggles to talk  Able to get out and work in the garden Using CPAP - may have helped some   Dec. 19, 2018:  [name] is seen today for follow up of her chronic diastolic CHF Check [facility] was [facility] this year.  She had a heart catheterization which revealed normal coronary arteries.  She has had a myocardial biopsy which was reportedly normal. History of paroxysmal atrial fibrillation which seems to be  well-controlled on  Tikosyn  PFTs has shown reduction of DLCO ( 73% pred.)   Is just getting over al cold  Has continued to have issues with vertigo  Not getting out to do any exercise    Wt Readings from Last 3 Encounters:  08/19/16 238 lb 9.6 oz (108.2 kg)  07/29/16 244 lb (110.7 kg)  07/10/16 239 lb (108.4 kg)      Past Medical History:  Diagnosis Date  . Back pain    nerve problems-had series of three injections  . Chronic anticoagulation   . Diastolic congestive heart failure (HCC)   . Dyspnea   . Hyperlipemia   . Migraine headache   . Paroxysmal atrial fibrillation (HCC)   . Restrictive cardiomyopathy (HCC)   . Tubal pregnancy     Past Surgical History:  Procedure Laterality Date  . BREAST LUMPECTOMY     benign  . CARDIAC CATHETERIZATION    . CARDIOVERSION    . CHOLECYSTECTOMY    . ECTOPIC PREGNANCY SURGERY  1980  . EYE SURGERY     left eye injections x2-was having "stroke like" flashes of light  . heart biopsy  2008   [facility]  . HYSTEROSCOPY  6/05   and D&C-simple hyperplasia secondary PMP bleeding  . ROTATOR CUFF REPAIR     bilateral     Current Outpatient Medications  Medication Sig Dispense Refill  . atorvastatin (LIPITOR) 20 MG tablet Take 1 tablet (20 mg total) daily by mouth. 30 tablet 11  . dofetilide (TIKOSYN) 125 MCG capsule TAKE 3 CAPSULES BY MOUTH  TWO TIMES DAILY 540 capsule 3  . ezetimibe (ZETIA) 10 MG tablet Take 1 tablet (10 mg total) by mouth daily. 90 tablet 3  . fluticasone (FLONASE) 50 MCG/ACT nasal spray Place 1 spray daily into both nostrils.    . furosemide (LASIX) 40 MG tablet TAKE 1 TABLET BY MOUTH TWO  TIMES DAILY 180 tablet 1  . gabapentin (NEURONTIN) 600 MG tablet Take 900 mg by mouth at bedtime.     . lidocaine (XYLOCAINE) 2 % solution Use as directed 20 mLs in the mouth or throat daily as needed for mouth pain. Headache    . meclizine (ANTIVERT) 25 MG tablet Take 25 mg 3 (three) times daily as needed by mouth for dizziness.     . metoCLOPramide (REGLAN) 10 MG tablet Take 10 mg by mouth 4 (four) times daily.    . Multiple Vitamins-Minerals (ICAPS AREDS 2) CAPS Take 1 capsule by mouth 2 (two) times daily.    . potassium chloride SA (K-DUR,KLOR-CON) 20 MEQ tablet TAKE 1 TABLET BY MOUTH TWO  TIMES DAILY 180 tablet 1  .  spironolactone (ALDACTONE) 25 MG tablet TAKE 1 TABLET BY MOUTH  DAILY 90 tablet 1  . XARELTO 20 MG TABS tablet TAKE 1 TABLET BY MOUTH  DAILY WITH SUPPER (Patient taking differently: TAKE 1 TABLET BY MOUTH  DAILY WITH BREAKFAST) 90 tablet 3   No current facility-administered medications for this visit.     Allergies:   Topamax [topiramate]; Codeine; Oxycodone; and Penicillins    Social History:  The patient  reports that  has never smoked. she has never used smokeless tobacco. She reports that she drinks alcohol. She reports that she does not use drugs.   Family History:  [name] family history includes Atrial fibrillation in her mother; Breast cancer in her sister; Cancer in her maternal grandmother and maternal uncle; Heart disease in her sister; Hypertension in her father; Thyroid cancer in her sister.    ROS:   As noted in current hx. Otherwise negative   Physical Exam: Last menstrual period 01/21/1990.  GEN:  Well nourished, well developed in no acute distress HEENT: Normal NECK: No JVD; No carotid bruits LYMPHATICS: No lymphadenopathy CARDIAC: RR, no murmurs, rubs, gallops RESPIRATORY:  Clear to auscultation without rales, wheezing or rhonchi  ABDOMEN: Soft, non-tender, non-distended MUSCULOSKELETAL:  No edema; No deformity  SKIN: Warm and dry NEUROLOGIC:  Alert and oriented x 3     Recent Labs: 03/25/2016: TSH 3.830 07/10/2016: BUN 20; Creatinine, Ser 0.95; Hemoglobin 13.8; Platelets 273; Potassium 4.7; Sodium 141 11/04/2016: ALT 15    Lipid Panel    Component Value Date/Time   CHOL 205 (H) 11/04/2016 0820   TRIG 120 11/04/2016 0820   HDL 40 11/04/2016 0820   CHOLHDL 5.1  (H) 11/04/2016 0820   CHOLHDL 4 10/22/2013 1501   VLDL 32.6 10/22/2013 1501   LDLCALC 141 (H) 11/04/2016 0820      Wt Readings from Last 3 Encounters:  08/19/16 238 lb 9.6 oz (108.2 kg)  07/29/16 244 lb (110.7 kg)  07/10/16 239 lb (108.4 kg)    ECG  Other studies Reviewed: Additional studies/ records that were reviewed today include: . Review of the above records demonstrates:    ASSESSMENT AND PLAN:  1. Atrial fibrillation - she is currently maintaining normal sinus rhythm. She's currently on Tikosyn and Xarelto.    is maintaining NSR  Check potassium. Magnesium since she is on Tikosyn    2. Hyperlipidemia-    Seeing Lipid clinic - is on Atorvastatin and zetia - will refill zetia via mail order pharmacy ( she will call back to give us specifics.  Check labs today   3. Diastolic congestive heart failure -   Seems to be stable.   Have encouraged diet, exercise and weight loss.      Current medicines are reviewed at length with the patient today.  The patient does not have concerns regarding medicines.  The following changes have been made:  See above.   Labs/ tests ordered today include:  No orders of the defined types were placed in this encounter.   Disposition:   FU with me in 6  months for OV,    Kristeen MissPhilip Leith Hedlund, MD  01/08/2017 8:58 AM    [facility] [address], [place], [place]  [address] Phone: [phone]; Fax: [phone]) [phone]

## 2017-01-08 NOTE — Patient Instructions (Signed)
Medication Instructions:  1. Your physician recommends that you continue on your current medications as directed. Please refer to the Current Medication list given to you today.   Labwork: TODAY BMET, LIPID, LFT, MAGNESIUM   Testing/Procedures: NONE ORDERED TODAY  Follow-Up: Your physician wants you to follow-up in: 6 MONTH WITH DR. Elease Hashimoto You will receive a reminder letter in the mail two months in advance. If you don't receive a letter, please call our office to schedule the follow-up appointment.   Any Other Special Instructions Will Be Listed Below (If Applicable).     If you need a refill on your cardiac medications before your next appointment, please call your pharmacy.

## 2017-01-09 ENCOUNTER — Ambulatory Visit: Payer: Medicare Other | Admitting: Physical Therapy

## 2017-01-09 ENCOUNTER — Encounter: Payer: Self-pay | Admitting: Physical Therapy

## 2017-01-09 DIAGNOSIS — H8111 Benign paroxysmal vertigo, right ear: Secondary | ICD-10-CM | POA: Diagnosis not present

## 2017-01-09 DIAGNOSIS — R42 Dizziness and giddiness: Secondary | ICD-10-CM

## 2017-01-09 LAB — LIPID PANEL
Chol/HDL Ratio: 3.2 ratio (ref 0.0–4.4)
Cholesterol, Total: 126 mg/dL (ref 100–199)
HDL: 39 mg/dL — AB (ref >39)
LDL Calculated: 49 mg/dL (ref 0–99)
TRIGLYCERIDES: 189 mg/dL — AB (ref 0–149)
VLDL Cholesterol Cal: 38 mg/dL (ref 5–40)

## 2017-01-09 LAB — BASIC METABOLIC PANEL
BUN/Creatinine Ratio: 16 (ref 12–28)
BUN: 14 mg/dL (ref 8–27)
CALCIUM: 9.3 mg/dL (ref 8.7–10.3)
CO2: 25 mmol/L (ref 20–29)
Chloride: 103 mmol/L (ref 96–106)
Creatinine, Ser: 0.87 mg/dL (ref 0.57–1.00)
GFR calc non Af Amer: 70 mL/min/{1.73_m2} (ref >59)
GFR, EST AFRICAN AMERICAN: 80 mL/min/{1.73_m2} (ref >59)
GLUCOSE: 100 mg/dL — AB (ref 65–99)
Potassium: 4.4 mmol/L (ref 3.5–5.2)
Sodium: 143 mmol/L (ref 134–144)

## 2017-01-09 LAB — HEPATIC FUNCTION PANEL
ALK PHOS: 95 IU/L (ref 39–117)
ALT: 13 IU/L (ref 0–32)
AST: 22 IU/L (ref 0–40)
Albumin: 4.1 g/dL (ref 3.6–4.8)
BILIRUBIN, DIRECT: 0.1 mg/dL (ref 0.00–0.40)
Bilirubin Total: 0.3 mg/dL (ref 0.0–1.2)
Total Protein: 6.8 g/dL (ref 6.0–8.5)

## 2017-01-09 LAB — MAGNESIUM: MAGNESIUM: 2.2 mg/dL (ref 1.6–2.3)

## 2017-01-10 DIAGNOSIS — H8111 Benign paroxysmal vertigo, right ear: Secondary | ICD-10-CM | POA: Diagnosis not present

## 2017-01-10 NOTE — Therapy (Signed)
[facility] [address] [place], [place], [address] Phone: [phone]   Fax:  [phone]  Physical Therapy Treatment and Discharge Summary  Patient Details  Name: [name] MRN: [id number] Date of Birth: 10/04/1950 Referring Provider: Russo, [name] MD   Encounter Date: 01/09/2017  PT End of Session - 01/10/17 0510    Visit Number  9 next G-code visit 16 (done 12/3)    Number of Visits  17 re-cert 12/3    Date for PT Re-Evaluation  02/21/17    Authorization Type  [employer] Medicare    Authorization Time Period  11/25/16 to 01/24/17;  12/23/16-02/21/2017    PT Start Time  0847    PT Stop Time  0930    PT Time Calculation (min)  43 min    Equipment Utilized During Treatment  -- S for safety    Activity Tolerance  Patient tolerated treatment well    Behavior During Therapy  WFL for tasks assessed/performed       Past Medical History:  Diagnosis Date  . Back pain    nerve problems-had series of three injections  . Chronic anticoagulation   . Diastolic congestive heart failure (HCC)   . Dyspnea   . Hyperlipemia   . Migraine headache   . Paroxysmal atrial fibrillation (HCC)   . Restrictive cardiomyopathy (HCC)   . Tubal pregnancy     Past Surgical History:  Procedure Laterality Date  . BREAST LUMPECTOMY     benign  . CARDIAC CATHETERIZATION    . CARDIOVERSION    . CHOLECYSTECTOMY    . ECTOPIC PREGNANCY SURGERY  1980  . EYE SURGERY     left eye injections x2-was having "stroke like" flashes of light  . heart biopsy  2008   [facility]  . HYSTEROSCOPY  6/05   and D&C-simple hyperplasia secondary PMP bleeding  . ROTATOR CUFF REPAIR     bilateral    There were no vitals filed for this visit.  Subjective Assessment - 01/09/17 0851    Subjective  Patient reports she was able to do VOR x 1 for 60 seconds seated with letter against the TV screen. She felt a little light-headed at the end, but not dizzy. She  only did exercise twice bc she had a migraine the other day.     Pertinent History  back pain, cardiomyopathy, CHF, dyspnea, migraine, PAF with anticoagulation, bil RCT, essential tremor    Patient Stated Goals  feel more "sure footed" when walking    Currently in Pain?  No/denies         [facility] PT Assessment - 01/10/17 0001      Functional Gait  Assessment   Gait assessed   Yes    Gait Level Surface  Walks 20 ft in less than 5.5 sec, no assistive devices, good speed, no evidence for imbalance, normal gait pattern, deviates no more than 6 in outside of the 12 in walkway width. 5.0    Change in Gait Speed  Able to smoothly change walking speed without loss of balance or gait deviation. Deviate no more than 6 in outside of the 12 in walkway width.    Gait with Horizontal Head Turns  Performs head turns smoothly with no change in gait. Deviates no more than 6 in outside 12 in walkway width    Gait with Vertical Head Turns  Performs head turns with no change in gait. Deviates no more than 6 in outside 12 in   walkway width.    Gait and Pivot Turn  Pivot turns safely within 3 sec and stops quickly with no loss of balance.    Step Over Obstacle  Is able to step over 2 stacked shoe boxes taped together (9 in total height) without changing gait speed. No evidence of imbalance.    Gait with Narrow Base of Support  Is able to ambulate for 10 steps heel to toe with no staggering.    Gait with Eyes Closed  Walks 20 ft, uses assistive device, slower speed, mild gait deviations, deviates 6-10 in outside 12 in walkway width. Ambulates 20 ft in less than 9 sec but greater than 7 sec.    Ambulating Backwards  Walks 20 ft, uses assistive device, slower speed, mild gait deviations, deviates 6-10 in outside 12 in walkway width.    Steps  Alternating feet, no rail.    Total Score  28                  [facility] Adult PT Treatment/Exercise - 01/09/17 0001      Standardized Balance Assessment   Standardized  Balance Assessment  --      Vestibular Treatment/Exercise - 01/09/17 0001      Vestibular Treatment/Exercise   Vestibular Treatment Provided  Gaze    Gaze Exercises  X1 Viewing Horizontal;X1 Viewing Vertical;Eye/Head Exercise Horizontal      X1 Viewing Horizontal   Foot Position  standing one foot on yoga block    Time  -- 60 sec    Reps  -- 1 with each leg on block      X1 Viewing Vertical   Foot Position  standing one foot on yoga block    Time  -- 60 sec    Reps  1      Eye/Head Exercise Horizontal   Foot Position  walking and searching for hidden cards along ~100 foot path with very slight imbalance noted            PT Education - 01/09/17 2105    Education provided  Yes    Education Details  results of assessment; excellent progress and prepared for d/c from PT; How to continue vestibular exercises and potentially taper    Person(s) Educated  Patient    Methods  Explanation;Demonstration    Comprehension  Verbalized understanding       PT Short Term Goals - 12/03/16 0845      PT SHORT TERM GOAL #1   Title  Get baseline DHI and set LTG as appropriate. (Target all STGs 12/03/16)    Time  1    Period  Weeks    Status  Achieved      PT SHORT TERM GOAL #2   Title  Measure gait velocity and update LTG as appropriate.    Time  1    Period  Weeks    Status  Achieved        PT Long Term Goals - 01/09/17 2106      PT LONG TERM GOAL #1   Title  Patient will have no sense of movement with positional testing (negative tests). (Target all LTGs 12/20/16) (New Target for ongoing and new goals 01/22/17)    Baseline  12/3 describes lightheadedness, no movement    Time  4    Period  Weeks    Status  Achieved      PT LONG TERM GOAL #2   Title  Patient will be independent with HEP.      Baseline  12/3 has been sick x 1 week and not done recently; reviewed with min cues needed    Time  4    Period  Weeks    Status  Achieved      PT LONG TERM GOAL #3   Title   Patient will improve gait velocity by >= 0.3 ft/sec as measure of improving balance and decreasing fall risk.    Baseline  12/3 3.7 (up from 3.3)    Time  4    Period  Weeks    Status  Achieved      PT LONG TERM GOAL #4   Title  Pt will improve DHI score to </=48% to improve QOL.    Baseline  12/3 52%; 12/20 could not pull up on computer    Status  Unable to assess due to time restrictions      PT LONG TERM GOAL #5   Title  Patient will improve FGA score to 26/30 demonstrating improved balance.     Baseline  23/30; 12/20 28/30    Time  4    Period  Weeks    Status  Achieved            Plan - 01/10/17 0512    Clinical Impression Statement  Session focused on balance and vestibular training and checking progress towards LTGs. Patient met 4 of 5 LTGs, with 5th goal unable to assess due to inability to pull up her record via computer of previous testing (FOTO). Patient very pleased with her progress and agrees she is prepared to discharge from PT.     Rehab Potential  Good    Clinical Impairments Affecting Rehab Potential  acute onset of symptoms; have been improving     PT Frequency  2x / week    PT Duration  4 weeks    PT Treatment/Interventions  ADLs/Self Care Home Management;Canalith Repostioning;Gait training;Functional mobility training;Therapeutic activities;Therapeutic exercise;Balance training;Neuromuscular re-education;Patient/family education;Vestibular;Visual/perceptual remediation/compensation    Consulted and Agree with Plan of Care  Patient       Patient will benefit from skilled therapeutic intervention in order to improve the following deficits and impairments:  Abnormal gait, Decreased balance, Difficulty walking, Dizziness, Other (comment)  Visit Diagnosis: Dizziness and giddiness   G-Codes - 01/10/17 0515    Functional Assessment Tool Used (Outpatient Only)  FGA 28/30    Functional Limitation  Changing and maintaining body position    Changing and  Maintaining Body Position Goal Status (G8982)  At least 1 percent but less than 20 percent impaired, limited or restricted    Changing and Maintaining Body Position Discharge Status (G8983)  At least 1 percent but less than 20 percent impaired, limited or restricted       Problem List Patient Active Problem List   Diagnosis Date Noted  . Morbid obesity (HCC) 05/13/2016  . OSA (obstructive sleep apnea) 02/27/2016  . Umbilical hernia without obstruction and without gangrene 11/04/2013  . Essential tremor 10/29/2012  . Cardiomyopathy (HCC) 04/05/2011  . Cyclitis, chronic 04/05/2011  . Degeneration macular 04/05/2011  . Migraine headache   . Dyspnea   . Hyperlipemia   . Chronic anticoagulation   . Chronic diastolic CHF (congestive heart failure) (HCC) 10/12/2009  . RESTLESS LEG SYNDROME 12/27/2006  . MIGRAINE, CHRONIC 12/27/2006  . ATRIAL FIBRILLATION 12/27/2006  . DYSPNEA 12/27/2006   PHYSICAL THERAPY DISCHARGE SUMMARY  Visits from Start of Care: 9  Current functional level related to goals / functional outcomes: PT Long Term Goals -   01/09/17 2106      PT LONG TERM GOAL #1   Title  Patient will have no sense of movement with positional testing (negative tests). (Target all LTGs 12/20/16) (New Target for ongoing and new goals 01/22/17)    Baseline  12/3 describes lightheadedness, no movement    Time  4    Period  Weeks    Status  Achieved      PT LONG TERM GOAL #2   Title  Patient will be independent with HEP.    Baseline  12/3 has been sick x 1 week and not done recently; reviewed with min cues needed    Time  4    Period  Weeks    Status  Achieved      PT LONG TERM GOAL #3   Title  Patient will improve gait velocity by >= 0.3 ft/sec as measure of improving balance and decreasing fall risk.    Baseline  12/3 3.7 (up from 3.3)    Time  4    Period  Weeks    Status  Achieved      PT LONG TERM GOAL #4   Title  Pt will improve DHI score to </=48% to improve QOL.     Baseline  12/3 52%; 12/20 could not pull up on computer    Status  Unable to assess due to time restrictions      PT LONG TERM GOAL #5   Title  Patient will improve FGA score to 26/30 demonstrating improved balance.     Baseline  23/30; 12/20 28/30    Time  4    Period  Weeks    Status  Achieved         Remaining deficits: Slight sense of dizziness with turns    Education / Equipment: HEP  Plan: Patient agrees to discharge.  Patient goals were not met. Patient is being discharged due to meeting the stated rehab goals.  ?????       Rexanne Mano, PT 01/10/2017, 5:17 AM  [facility] [address] [place], [place], [address] Phone: [phone]   Fax:  [phone]  Name: REX MAGEE MRN: [id number] Date of Birth: 05/14/1950

## 2017-01-16 ENCOUNTER — Encounter: Payer: Medicare Other | Admitting: Physical Therapy

## 2017-01-22 ENCOUNTER — Encounter: Payer: Medicare Other | Admitting: Physical Therapy

## 2017-01-24 ENCOUNTER — Encounter: Payer: Medicare Other | Admitting: Physical Therapy

## 2017-04-17 ENCOUNTER — Ambulatory Visit (INDEPENDENT_AMBULATORY_CARE_PROVIDER_SITE_OTHER): Payer: Medicare Other | Admitting: Obstetrics & Gynecology

## 2017-04-17 ENCOUNTER — Other Ambulatory Visit (HOSPITAL_COMMUNITY)
Admission: RE | Admit: 2017-04-17 | Discharge: 2017-04-17 | Disposition: A | Discharge status: Home / Self Care | Payer: Medicare Other | Source: Ambulatory Visit | Attending: Obstetrics & Gynecology | Admitting: Obstetrics & Gynecology

## 2017-04-17 ENCOUNTER — Other Ambulatory Visit: Payer: Self-pay

## 2017-04-17 ENCOUNTER — Encounter: Payer: Self-pay | Admitting: Obstetrics & Gynecology

## 2017-04-17 VITALS — BP 110/80 | HR 66 | Resp 16 | Ht 69.0 in | Wt 238.0 lb

## 2017-04-17 DIAGNOSIS — Z124 Encounter for screening for malignant neoplasm of cervix: Secondary | ICD-10-CM | POA: Diagnosis present

## 2017-04-17 DIAGNOSIS — Z01419 Encounter for gynecological examination (general) (routine) without abnormal findings: Secondary | ICD-10-CM | POA: Diagnosis not present

## 2017-04-17 NOTE — Progress Notes (Addendum)
67 y.o. G1P0 MarriedCaucasianF here for annual exam.  Reported last year a lot of family stressors--husband had heart attack (but is now stable and improved), sister-in-law had breast cancer and bilateral mastectomy as well ostomy due to bowel tumor.  Now with a fistula.  She's been in an out of the hospital.  Father-in-law has Parkinson's.  She and husband continue to do a lot of care giving.    Sister-in-law on her side of family died.  Her dog had an arterial bleed in her nose.  Had blood every where and huge vet bill to fix this.  Stressors seem to have continued into this year.    Had significant case of vertigo last year.  Did PT and that is much improved.    Continues to see Dr. Melburn Popper.  Had episode of SOB this past year.  Evaluation didn't show any significant changes.    Denies vaginal bleeding.   PCP:  Dr. Timothy Lasso.    Patient's last menstrual period was 01/21/1990.          Sexually active: Yes.    The current method of family planning is post menopausal status.    Exercising: No.   Smoker:  no  Health Maintenance: Pap:  12/30/14 Neg. HR HPV:neg  11/04/13 Neg   History of abnormal Pap:  no  MMG:  08/21/16 BIRADS1:neg  Colonoscopy:  2007 normal. Cologuard 2019 Normal  BMD:   2011  TDaP:  2016 Pneumonia vaccine(s):  2017 Shingrix:   Done  Hep C testing: done with PCP Screening Labs: PCP   reports that she has never smoked. She has never used smokeless tobacco. She reports that she drinks alcohol. She reports that she does not use drugs.  Past Medical History:  Diagnosis Date  . Back pain    nerve problems-had series of three injections  . Chronic anticoagulation   . Diastolic congestive heart failure (HCC)   . Dyspnea   . Hyperlipemia   . Migraine headache   . Paroxysmal atrial fibrillation (HCC)   . Restrictive cardiomyopathy (HCC)   . Tubal pregnancy     Past Surgical History:  Procedure Laterality Date  . BREAST LUMPECTOMY     benign  . CARDIAC CATHETERIZATION     . CARDIOVERSION    . CHOLECYSTECTOMY    . ECTOPIC PREGNANCY SURGERY  1980  . EYE SURGERY     left eye injections x2-was having "stroke like" flashes of light  . heart biopsy  2008   [facility]  . HYSTEROSCOPY  6/05   and D&C-simple hyperplasia secondary PMP bleeding  . ROTATOR CUFF REPAIR     bilateral    Current Outpatient Medications  Medication Sig Dispense Refill  . atorvastatin (LIPITOR) 20 MG tablet Take 1 tablet (20 mg total) daily by mouth. 30 tablet 11  . dofetilide (TIKOSYN) 125 MCG capsule TAKE 3 CAPSULES BY MOUTH  TWO TIMES DAILY 540 capsule 3  . ezetimibe (ZETIA) 10 MG tablet Take 1 tablet (10 mg total) by mouth daily. 90 tablet 3  . fluticasone (FLONASE) 50 MCG/ACT nasal spray Place 1 spray daily into both nostrils.    . furosemide (LASIX) 40 MG tablet TAKE 1 TABLET BY MOUTH TWO  TIMES DAILY 180 tablet 1  . gabapentin (NEURONTIN) 600 MG tablet Take 900 mg by mouth at bedtime.     . lidocaine (XYLOCAINE) 2 % solution Use as directed 20 mLs in the mouth or throat daily as needed for mouth pain. Headache    .  meclizine (ANTIVERT) 25 MG tablet Take 25 mg 3 (three) times daily as needed by mouth for dizziness.    . Multiple Vitamins-Minerals (ICAPS AREDS 2) CAPS Take 1 capsule by mouth 2 (two) times daily.    . potassium chloride SA (K-DUR,KLOR-CON) 20 MEQ tablet TAKE 1 TABLET BY MOUTH TWO  TIMES DAILY 180 tablet 1  . spironolactone (ALDACTONE) 25 MG tablet TAKE 1 TABLET BY MOUTH  DAILY 90 tablet 1  . XARELTO 20 MG TABS tablet TAKE 1 TABLET BY MOUTH  DAILY WITH SUPPER (Patient taking differently: TAKE 1 TABLET BY MOUTH  DAILY WITH BREAKFAST) 90 tablet 3   No current facility-administered medications for this visit.     Family History  Problem Relation Age of Onset  . Hypertension Father   . Atrial fibrillation Mother   . Cancer Maternal Grandmother        mouth  . Cancer Maternal Uncle        unknown type  . Thyroid cancer Sister   . Breast cancer Sister        breast  cancer  . Heart disease Sister     Review of Systems  All other systems reviewed and are negative.   Exam:   BP 110/80 (BP Location: Left Arm, Patient Position: Sitting, Cuff Size: Large)   Pulse 66   Resp 16   Ht 5\' 9"  (1.753 m)   Wt 238 lb (108 kg)   LMP 01/21/1990   BMI 35.15 kg/m     Height: 5\' 9"  (175.3 cm)  Ht Readings from Last 3 Encounters:  04/17/17 5\' 9"  (1.753 m)  01/08/17 5\' 9"  (1.753 m)  08/19/16 5' 9.5" (1.765 m)    General appearance: alert, cooperative and appears stated age Head: Normocephalic, without obvious abnormality, atraumatic Neck: no adenopathy, supple, symmetrical, trachea midline and thyroid normal to inspection and palpation Lungs: clear to auscultation bilaterally Breasts: normal appearance, no masses or tenderness Heart: regular rate and rhythm Abdomen: soft, non-tender; bowel sounds normal; no masses,  About 4cm umbilical hernia, no organomegaly  Extremities: extremities normal, atraumatic, no cyanosis or edema Skin: Skin color, texture, turgor normal. No rashes or lesions Lymph nodes: Cervical, supraclavicular, and axillary nodes normal. No abnormal inguinal nodes palpated Neurologic: Grossly normal   Pelvic: External genitalia:  no lesions              Urethra:  normal appearing urethra with no masses, tenderness or lesions              Bartholins and Skenes: normal                 Vagina: normal appearing vagina with normal color and discharge, no lesions              Cervix: no lesions              Pap taken: Yes.   Bimanual Exam:  Uterus:  normal size, contour, position, consistency, mobility, non-tender              Adnexa: normal adnexa and no mass, fullness, tenderness               Rectovaginal: Confirms               Anus:  normal sphincter tone, no lesions  Chaperone was present for exam.  A:  Well Woman with normal exam PMP, no HRT H/O paroxysmal atrial fibrillation.  On ASA and Xarelto H/O heart failure of unknown  etiology.  Followed by Dr. Melburn Popper every six months H/O abdominal hernia.  Did see general surgery.  Decided not to proceed with any surgery.    P:   Mammogram guidelines reviewed.  Doing yearly and 3D due to grade C breast density pap smear obtained today Lab work and vaccines are UTD with Dr. Timothy Lasso return annually or prn

## 2017-04-18 LAB — CYTOLOGY - PAP: DIAGNOSIS: NEGATIVE

## 2017-04-30 ENCOUNTER — Other Ambulatory Visit: Payer: Self-pay | Admitting: Cardiovascular Disease

## 2017-05-09 ENCOUNTER — Telehealth: Payer: Self-pay | Admitting: Cardiovascular Disease

## 2017-05-09 NOTE — Telephone Encounter (Signed)
New Message   Pt c/o medication issue:  1. Name of Medication: dofetilide (TIKOSYN) 125 MCG capsule and lasix  2. How are you currently taking this medication (dosage and times per day)? TAKE 3 CAPSULES BY MOUTH TWO TIMES DAILY and TAKE 1 TABLET BY MOUTH TWO TIMES DAILY  3. Are you having a reaction (difficulty breathing--STAT)? no  4. What is your medication issue? Ray from [employer] is calling wanting to know if these medications together will counteract  with each other. Please call Ref # [id number]

## 2017-05-09 NOTE — Telephone Encounter (Signed)
Advised ok for patient to be taking both.  Informed pt has been taking these for years. They appreciate the call.

## 2017-06-22 ENCOUNTER — Other Ambulatory Visit: Payer: Self-pay | Admitting: Cardiovascular Disease

## 2017-06-23 NOTE — Telephone Encounter (Signed)
Pt is a 67 yr old female who last saw Dr Elease Hashimoto on 01/08/17. Last noted weight was 108 Kg. CrCl is 182mL/min. Will refill Xarelto 20mg  QD.

## 2017-07-22 ENCOUNTER — Other Ambulatory Visit: Payer: Self-pay | Admitting: Obstetrics & Gynecology

## 2017-07-22 DIAGNOSIS — Z1231 Encounter for screening mammogram for malignant neoplasm of breast: Secondary | ICD-10-CM

## 2017-07-23 ENCOUNTER — Ambulatory Visit: Payer: Medicare Other | Admitting: Cardiovascular Disease

## 2017-07-23 ENCOUNTER — Encounter: Payer: Self-pay | Admitting: Cardiovascular Disease

## 2017-07-23 VITALS — BP 118/78 | HR 98 | Ht 69.0 in | Wt 238.1 lb

## 2017-07-23 DIAGNOSIS — I480 Paroxysmal atrial fibrillation: Secondary | ICD-10-CM

## 2017-07-23 DIAGNOSIS — E782 Mixed hyperlipidemia: Secondary | ICD-10-CM

## 2017-07-23 DIAGNOSIS — Z79899 Other long term (current) drug therapy: Secondary | ICD-10-CM

## 2017-07-23 LAB — HEPATIC FUNCTION PANEL
ALT: 9 IU/L (ref 0–32)
AST: 14 IU/L (ref 0–40)
Albumin: 4.2 g/dL (ref 3.6–4.8)
Alkaline Phosphatase: 127 IU/L — ABNORMAL HIGH (ref 39–117)
BILIRUBIN TOTAL: 0.4 mg/dL (ref 0.0–1.2)
BILIRUBIN, DIRECT: 0.13 mg/dL (ref 0.00–0.40)
TOTAL PROTEIN: 7 g/dL (ref 6.0–8.5)

## 2017-07-23 LAB — BASIC METABOLIC PANEL
BUN/Creatinine Ratio: 13 (ref 12–28)
BUN: 13 mg/dL (ref 8–27)
CALCIUM: 9.7 mg/dL (ref 8.7–10.3)
CHLORIDE: 103 mmol/L (ref 96–106)
CO2: 26 mmol/L (ref 20–29)
Creatinine, Ser: 1.01 mg/dL — ABNORMAL HIGH (ref 0.57–1.00)
GFR calc Af Amer: 67 mL/min/{1.73_m2} (ref >59)
GFR calc non Af Amer: 58 mL/min/{1.73_m2} — ABNORMAL LOW (ref >59)
GLUCOSE: 112 mg/dL — AB (ref 65–99)
Potassium: 4.7 mmol/L (ref 3.5–5.2)
Sodium: 143 mmol/L (ref 134–144)

## 2017-07-23 LAB — LIPID PANEL
CHOLESTEROL TOTAL: 134 mg/dL (ref 100–199)
Chol/HDL Ratio: 3.4 ratio (ref 0.0–4.4)
HDL: 40 mg/dL (ref >39)
LDL CALC: 63 mg/dL (ref 0–99)
Triglycerides: 153 mg/dL — ABNORMAL HIGH (ref 0–149)
VLDL CHOLESTEROL CAL: 31 mg/dL (ref 5–40)

## 2017-07-23 LAB — MAGNESIUM: MAGNESIUM: 2.4 mg/dL — AB (ref 1.6–2.3)

## 2017-07-23 MED ORDER — ATORVASTATIN CALCIUM 20 MG PO TABS: 20.0000 mg | ORAL_TABLET | Freq: Every day | ORAL | 3 refills | Status: DC

## 2017-07-23 NOTE — Progress Notes (Signed)
Cardiology Office Note   Date:  07/23/2017   ID:  [name], DOB 1950-04-28, MRN [id number]  PCP:  Creola Corn, MD  Cardiologist:   Kristeen Miss, MD   Chief Complaint  Patient presents with  . Congestive Heart Failure   1. Atrial fibrillation 2. Hyperlipidemia 3. Diastolic congestive heart failure    67 year old female with a history of atrial fibrillation. She's been well controlled on Tikosyn. She also has a history of hyperlipidemia and is on Lipitor. She's been on chronic Pradaxa therapy for anticoagulation.  February 20, 2012 [name] has done well since I last saw her. 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 She has occasional palpitations but overall she's doing quite well. She is exercising ( water aerobics) twice a week.  Feb. 24, 2015:  [name] is doing well. No problems. Maintaining NSR.  Sept. 16, 2015:  She was in the ER this past week. CP, dyspnea .. CT angio was negative for PE - showed ? Of pneumonia. Was treated wit Abx. Does well in the am's .[name] and perhaps more short of breath in the afternoons.   April 05, 2014: [name] is a 67 y.o. female who presents for follow-up of her atrial fibrillation. She has continued to have some DOE,  Is concerned about the atorvastatin. Was seen in the lipid clinic.    Sept. 30, 2016:  Doing well.  No CP , no dyspnea. Has lost 11 lbs since last year.  Is in NSR  April 10, 2015:  [name] is doing ok from a cardiology standpoint. Has had some dental issues   Sept.  22, 2017:  [name] is seen for follow up of her atrial fib.    No  CP or dyspnea. No further episodes of atrial fib.   Dec. 11, 2017:  [name] is seen today for followup for her atrial fib  Has some DOE  Not exercising as much as she needs to  No CP   Jan. 16, 2018: [name] is seen today as a work in visit at the request of Dr. Timothy Lasso. Been having more shortness of breath and was thought  to have worsening congestive heart failure.  Has severe DOE with any exertion. We performed a cardiac cath in 2008 which revealed normal coronaries.   She went to [facility] and had a heart biopsy.   Was not told of any abnormality.  Does have dull chest pain with exertion and with sitting   She had an echocardiogram performed yesterday which revealed normal left ventricular systolic function. She does have grade 2 diastolic dysfunction. Her Lasix was increased last week.   She has not noticed any increase in urine output.  No change in her breathing .  Has noticed that she loses her breath if she does any exertion .  Cannot lay supine - has severe dyspnea.   April 02, 2016:  [name] is seen today for follow up visit .    Seen with husband , [name] .  She had an episode of PAF yesterday  - lasted for several hours.   Is back in NSR currently .  Had an episode in Feb. That lasted 2-3 hours , HR of 129 ,  She was very short of breath with that.   -Sleep study on Feb. 20 shows mild OSA -PFTs 01/2016 showed ratio 72, FVC 74%, FEV1 69%, TLC 91% and DLCO 73% suggesting moderate intraparenchymal restriction  - echo shows normal LV  systolic function with grade 2 diastolic dysfunction. She has an estimated PA pressure of 35.  Has developed a tremor.   Is seeing a neurologist .   July 10, 2016:  Had a CPX in March , Normal functional capacity compared to sedentary patients.  Still has some vocal issues ( when she gets fatigued, she loses her voice) struggles to talk  Able to get out and work in the garden Using CPAP - may have helped some   Dec. 19, 2018:  [name] is seen today for follow up of her chronic diastolic CHF Check [facility] this year.  She had a heart catheterization which revealed normal coronary arteries.  She has had a myocardial biopsy which was reportedly normal. History of paroxysmal atrial fibrillation which seems to be  well-controlled on  Tikosyn  PFTs has shown reduction of DLCO ( 73% pred.)   Is just getting over al cold  Has continued to have issues with vertigo  Not getting out to do any exercise   July 23, 2017:  Doing well,  No CP or dyspnea. Gets short of breath with the heat.  Started getting some exercise, yoga .  No cardiac exercise  Is having some joint issues,      Wt Readings from Last 3 Encounters:  07/23/17 238 lb 1.9 oz (108 kg)  04/17/17 238 lb (108 kg)  01/08/17 238 lb 6.4 oz (108.1 kg)      Past Medical History:  Diagnosis Date  . Back pain    nerve problems-had series of three injections  . Chronic anticoagulation   . Diastolic congestive heart failure (HCC)   . Dyspnea   . Hyperlipemia   . Migraine headache   . Paroxysmal atrial fibrillation (HCC)   . Restrictive cardiomyopathy (HCC)   . Tubal pregnancy     Past Surgical History:  Procedure Laterality Date  . BREAST LUMPECTOMY     benign  . CARDIAC CATHETERIZATION    . CARDIOVERSION    . CHOLECYSTECTOMY    . ECTOPIC PREGNANCY SURGERY  1980  . EYE SURGERY     left eye injections x2-was having "stroke like" flashes of light  . heart biopsy  2008   [facility]  . HYSTEROSCOPY  6/05   and D&C-simple hyperplasia secondary PMP bleeding  . ROTATOR CUFF REPAIR     bilateral     Current Outpatient Medications  Medication Sig Dispense Refill  . atorvastatin (LIPITOR) 20 MG tablet TAKE 1 TABLET (20 MG TOTAL) DAILY BY MOUTH.  11  . dofetilide (TIKOSYN) 125 MCG capsule TAKE 3 CAPSULES BY MOUTH  TWO TIMES DAILY 540 capsule 2  . ezetimibe (ZETIA) 10 MG tablet Take 1 tablet (10 mg total) by mouth daily. 90 tablet 3  . fluticasone (FLONASE) 50 MCG/ACT nasal spray Place 1 spray daily into both nostrils.    . furosemide (LASIX) 40 MG tablet TAKE 1 TABLET BY MOUTH TWO  TIMES DAILY 180 tablet 2  . gabapentin (NEURONTIN) 600 MG tablet Take 900 mg by mouth at bedtime.     . lidocaine (XYLOCAINE) 2 % solution Use as directed  20 mLs in the mouth or throat daily as needed for mouth pain. Headache    . meclizine (ANTIVERT) 25 MG tablet Take 25 mg 3 (three) times daily as needed by mouth for dizziness.    . Multiple Vitamins-Minerals (ICAPS AREDS 2) CAPS Take 1 capsule by mouth 2 (two) times daily.    [name]  potassium chloride SA (K-DUR,KLOR-CON) 20 MEQ tablet TAKE 1 TABLET BY MOUTH TWO  TIMES DAILY 180 tablet 2  . rivaroxaban (XARELTO) 20 MG TABS tablet Take 1 tablet (20 mg total) by mouth daily with supper. 90 tablet 1  . spironolactone (ALDACTONE) 25 MG tablet TAKE 1 TABLET BY MOUTH  DAILY 90 tablet 3   No current facility-administered medications for this visit.     Allergies:   Topamax [topiramate]; Codeine; Oxycodone; and Penicillins    Social History:  The patient  reports that she has never smoked. She has never used smokeless tobacco. She reports that she drinks alcohol. She reports that she does not use drugs.   Family History:  [name] family history includes Atrial fibrillation in her mother; Breast cancer in her sister; Cancer in her maternal grandmother and maternal uncle; Heart disease in her sister; Hypertension in her father; Thyroid cancer in her sister.    ROS:   As noted in current hx. Otherwise negative   Physical Exam: Blood pressure 118/78, pulse 98, height 5\' 9"  (1.753 m), weight 238 lb 1.9 oz (108 kg), last menstrual period 01/21/1990, SpO2 98 %.  GEN:  Well nourished, well developed in no acute distress HEENT: Normal NECK: No JVD; No carotid bruits LYMPHATICS: No lymphadenopathy CARDIAC: RR, no murmurs, rubs, gallops RESPIRATORY:  Clear to auscultation without rales, wheezing or rhonchi  ABDOMEN: Soft, non-tender, non-distended MUSCULOSKELETAL:  No edema; No deformity  SKIN: Warm and dry NEUROLOGIC:  Alert and oriented x 3      Recent Labs: 01/08/2017: ALT 13; BUN 14; Creatinine, Ser 0.87; Magnesium 2.2; Potassium 4.4; Sodium 143    Lipid Panel    Component Value  Date/Time   CHOL 126 01/08/2017 0926   TRIG 189 (H) 01/08/2017 0926   HDL 39 (L) 01/08/2017 0926   CHOLHDL 3.2 01/08/2017 0926   CHOLHDL 4 10/22/2013 1501   VLDL 32.6 10/22/2013 1501   LDLCALC 49 01/08/2017 0926      Wt Readings from Last 3 Encounters:  07/23/17 238 lb 1.9 oz (108 kg)  04/17/17 238 lb (108 kg)  01/08/17 238 lb 6.4 oz (108.1 kg)    ECG  July 23, 2017: Normal sinus rhythm at 60.  Low voltage QRS.  Borderline EKG.  Other studies Reviewed: Additional studies/ records that were reviewed today include: . Review of the above records demonstrates:    ASSESSMENT AND PLAN:  1. Atrial fibrillation -she is maintaining normal sinus rhythm.  Continue Tikosyn.  Will check magnesium and basic metabolic profile today.   2. Hyperlipidemia-    Continue meds.  She is had some joint pain.  I suggested that she try coenzyme Q 10.  Check labs today.  3. Diastolic congestive heart failure -   seems to be stable.     Current medicines are reviewed at length with the patient today.  The patient does not have concerns regarding medicines.  The following changes have been made:  See above.   Labs/ tests ordered today include:  No orders of the defined types were placed in this encounter.   Disposition:   FU with me in 6  months for OV,    Kristeen Miss, MD  07/23/2017 8:30 AM    [facility] [address] [place], [place], [place]  [address] Phone: [phone]; Fax: [phone]

## 2017-07-23 NOTE — Patient Instructions (Signed)
Medication Instructions:  Your physician recommends that you continue on your current medications as directed. Please refer to the Current Medication list given to you today.   Labwork: TODAY - magnesium, liver panel, basic metabolic panel, cholesterol   Testing/Procedures: None Ordered   Follow-Up: Your physician wants you to follow-up in: 6 months with Dr. Elease Hashimoto.  You will receive a reminder letter in the mail two months in advance. If you don't receive a letter, please call our office to schedule the follow-up appointment.   If you need a refill on your cardiac medications before your next appointment, please call your pharmacy.   Thank you for choosing [facility]! Eligha Bridegroom, RN [phone]

## 2017-08-11 ENCOUNTER — Other Ambulatory Visit: Payer: Self-pay

## 2017-08-22 ENCOUNTER — Ambulatory Visit
Admission: RE | Admit: 2017-08-22 | Discharge: 2017-08-22 | Disposition: A | Discharge status: Home / Self Care | Payer: Medicare Other | Source: Ambulatory Visit | Attending: Obstetrics & Gynecology | Admitting: Obstetrics & Gynecology

## 2017-08-22 DIAGNOSIS — Z1231 Encounter for screening mammogram for malignant neoplasm of breast: Secondary | ICD-10-CM

## 2017-10-07 ENCOUNTER — Other Ambulatory Visit: Payer: Self-pay | Admitting: Cardiovascular Disease

## 2017-12-24 ENCOUNTER — Other Ambulatory Visit: Payer: Self-pay | Admitting: Cardiovascular Disease

## 2018-02-02 ENCOUNTER — Encounter: Payer: Self-pay | Admitting: Cardiovascular Disease

## 2018-02-02 ENCOUNTER — Ambulatory Visit: Payer: Medicare Other | Admitting: Cardiovascular Disease

## 2018-02-02 VITALS — BP 110/68 | HR 63 | Ht 69.0 in | Wt 241.1 lb

## 2018-02-02 DIAGNOSIS — I5032 Chronic diastolic (congestive) heart failure: Secondary | ICD-10-CM | POA: Diagnosis not present

## 2018-02-02 DIAGNOSIS — R079 Chest pain, unspecified: Secondary | ICD-10-CM | POA: Diagnosis not present

## 2018-02-02 DIAGNOSIS — I480 Paroxysmal atrial fibrillation: Secondary | ICD-10-CM | POA: Diagnosis not present

## 2018-02-02 LAB — BASIC METABOLIC PANEL
BUN/Creatinine Ratio: 21 (ref 12–28)
BUN: 19 mg/dL (ref 8–27)
CALCIUM: 9.7 mg/dL (ref 8.7–10.3)
CO2: 24 mmol/L (ref 20–29)
Chloride: 98 mmol/L (ref 96–106)
Creatinine, Ser: 0.92 mg/dL (ref 0.57–1.00)
GFR, EST AFRICAN AMERICAN: 75 mL/min/{1.73_m2} (ref >59)
GFR, EST NON AFRICAN AMERICAN: 65 mL/min/{1.73_m2} (ref >59)
Glucose: 98 mg/dL (ref 65–99)
Potassium: 4.1 mmol/L (ref 3.5–5.2)
Sodium: 140 mmol/L (ref 134–144)

## 2018-02-02 LAB — MAGNESIUM: Magnesium: 2.2 mg/dL (ref 1.6–2.3)

## 2018-02-02 MED ORDER — PROPRANOLOL HCL 10 MG PO TABS: 10.0000 mg | ORAL_TABLET | Freq: Four times a day (QID) | ORAL | 6 refills | Status: DC | PRN

## 2018-02-02 NOTE — Patient Instructions (Addendum)
Medication Instructions:  1) START PROPRANOLOL 10 mg as needed for atrial fibrillation  Labwork: TODAY: BMET, magnesium  Testing/Procedures: Dr. Elease Hashimoto recommends you have a CARDIAC CT.  Follow-Up: Your provider wants you to follow-up in: 6 months with Dr. Elease Hashimoto. You will receive a reminder letter in the mail two months in advance. If you don't receive a letter, please call our office to schedule the follow-up appointment.    Any Other Special Instructions Will Be Listed Below (If Applicable).  CT INSTRUCTIONS: Please arrive at the [facility] main entrance of [facility] at xx:xx AM (30-45 minutes prior to test start time)  [facility] [address] [place], [place] [address] [phone]  Proceed to the [facility] Radiology Department (First Floor).  Please follow these instructions carefully (unless otherwise directed):   On the Night Before the Test: . Be sure to Drink plenty of water. . Do not consume any caffeinated/decaffeinated beverages or chocolate 12 hours prior to your test. . Do not take any antihistamines 12 hours prior to your test.  On the Day of the Test: . Drink plenty of water. Do not drink any water within one hour of the test. . Do not eat any food 4 hours prior to the test. . You may take your regular medications prior to the test.  . TAKE PROPRANOLOL 10 mg  two hours prior to test. Also, bring your bottle with you.       After the Test: . Drink plenty of water. . After receiving IV contrast, you may experience a mild flushed feeling. This is normal. . On occasion, you may experience a mild rash up to 24 hours after the test. This is not dangerous. If this occurs, you can take Benadryl 25 mg and increase your fluid intake. . If you experience trouble breathing, this can be serious. If it is severe call 911 IMMEDIATELY. If it is mild, please call our office.

## 2018-02-02 NOTE — Progress Notes (Signed)
Cardiology Office Note   Date:  02/02/2018   ID:  [name], DOB February 08, 1950, MRN [id number]  PCP:  Creola Corn, MD  Cardiologist:   Kristeen Miss, MD   No chief complaint on file.  1. Atrial fibrillation 2. Hyperlipidemia 3. Diastolic congestive heart failure    68 year old female with a history of atrial fibrillation. She's been well controlled on Tikosyn. She also has a history of hyperlipidemia and is on Lipitor. She's been on chronic Pradaxa therapy for anticoagulation.  February 20, 2012 [name] has done well since I last saw her. 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 She has occasional palpitations but overall she's doing quite well. She is exercising ( water aerobics) twice a week.  Feb. 24, 2015:  [name] is doing well. No problems. Maintaining NSR.  Sept. 16, 2015:  She was in the ER this past week. CP, dyspnea .. CT angio was negative for PE - showed ? Of pneumonia. Was treated wit Abx. Does well in the am's .[name] and perhaps more short of breath in the afternoons.   April 05, 2014: [name] is a 68 y.o. female who presents for follow-up of her atrial fibrillation. She has continued to have some DOE,  Is concerned about the atorvastatin. Was seen in the lipid clinic.    Sept. 30, 2016:  Doing well.  No CP , no dyspnea. Has lost 11 lbs since last year.  Is in NSR  April 10, 2015:  [name] is doing ok from a cardiology standpoint. Has had some dental issues   Sept.  22, 2017:  [name] is seen for follow up of her atrial fib.    No  CP or dyspnea. No further episodes of atrial fib.   Dec. 11, 2017:  [name] is seen today for followup for her atrial fib  Has some DOE  Not exercising as much as she needs to  No CP   Jan. 16, 2018: [name] is seen today as a work in visit at the request of Dr. Timothy Lasso. Been having more shortness of breath and was thought to have worsening congestive heart  failure.  Has severe DOE with any exertion. We performed a cardiac cath in 2008 which revealed normal coronaries.   She went to [facility] and had a heart biopsy.   Was not told of any abnormality.  Does have dull chest pain with exertion and with sitting   She had an echocardiogram performed yesterday which revealed normal left ventricular systolic function. She does have grade 2 diastolic dysfunction. Her Lasix was increased last week.   She has not noticed any increase in urine output.  No change in her breathing .  Has noticed that she loses her breath if she does any exertion .  Cannot lay supine - has severe dyspnea.   April 02, 2016:  [name] is seen today for follow up visit .    Seen with husband , [name] .  She had an episode of PAF yesterday  - lasted for several hours.   Is back in NSR currently .  Had an episode in Feb. That lasted 2-3 hours , HR of 129 ,  She was very short of breath with that.   -Sleep study on Feb. 20 shows mild OSA -PFTs 01/2016 showed ratio 72, FVC 74%, FEV1 69%, TLC 91% and DLCO 73% suggesting moderate intraparenchymal restriction  - echo shows normal LV systolic function with grade 2 diastolic dysfunction.  She has an estimated PA pressure of 35.  Has developed a tremor.   Is seeing a neurologist .   July 10, 2016:  Had a CPX in March , Normal functional capacity compared to sedentary patients.  Still has some vocal issues ( when she gets fatigued, she loses her voice) struggles to talk  Able to get out and work in the garden Using CPAP - may have helped some   Dec. 19, 2018:  [name] PihJackie is seen today for follow up of her chronic diastolic CHF Check [facility]  She had a heart catheterization which revealed normal coronary arteries.  She has had a myocardial biopsy which was reportedly normal. History of paroxysmal atrial fibrillation which seems to be well-controlled on  Tikosyn  PFTs has shown  reduction of DLCO ( 73% pred.)   Is just getting over al cold  Has continued to have issues with vertigo  Not getting out to do any exercise   July 23, 2017:  Doing well,  No CP or dyspnea. Gets short of breath with the heat.  Started getting some exercise, yoga .  No cardiac exercise  Is having some joint issues,     February 02, 2018: He is seen today for follow-up visit.  She has a history of atrial fibrillation. She remains in normal sinus rhythm on Tikosyn.   She had an episode of paroxysmal atrial fibrillation this past Thursday.  The episode lasted for about 3 hours.   She took 1 of [name]'s propranolol tablets and went back into sinus rhythm fairly quickly.  Her QT intervals have remained good.    Wt Readings from Last 3 Encounters:  02/02/18 241 lb 1.9 oz (109.4 kg)  07/23/17 238 lb 1.9 oz (108 kg)  04/17/17 238 lb (108 kg)      Past Medical History:  Diagnosis Date  . Back pain    nerve problems-had series of three injections  . Chronic anticoagulation   . Diastolic congestive heart failure (HCC)   . Dyspnea   . Hyperlipemia   . Migraine headache   . Paroxysmal atrial fibrillation (HCC)   . Restrictive cardiomyopathy (HCC)   . Tubal pregnancy     Past Surgical History:  Procedure Laterality Date  . BREAST EXCISIONAL BIOPSY Left    benign  . BREAST LUMPECTOMY     benign  . CARDIAC CATHETERIZATION    . CARDIOVERSION    . CHOLECYSTECTOMY    . ECTOPIC PREGNANCY SURGERY  1980  . EYE SURGERY     left eye injections x2-was having "stroke like" flashes of light  . heart biopsy  [id number]  . HYSTEROSCOPY  6/05   and D&C-simple hyperplasia secondary PMP bleeding  . ROTATOR CUFF REPAIR     bilateral     Current Outpatient Medications  Medication Sig Dispense Refill  . atorvastatin (LIPITOR) 20 MG tablet Take 1 tablet (20 mg total) by mouth daily at 6 PM. 90 tablet 3  . dofetilide (TIKOSYN) 125 MCG capsule TAKE 3 CAPSULES BY MOUTH  TWO TIMES DAILY 540  capsule 2  . ezetimibe (ZETIA) 10 MG tablet TAKE 1 TABLET BY MOUTH  DAILY 90 tablet 2  . fluticasone (FLONASE) 50 MCG/ACT nasal spray Place 1 spray daily into both nostrils.    . furosemide (LASIX) 40 MG tablet TAKE 1 TABLET BY MOUTH TWO  TIMES DAILY 180 tablet 2  . gabapentin (NEURONTIN) 600 MG  tablet Take 900 mg by mouth at bedtime.     . lidocaine (XYLOCAINE) 2 % solution Use as directed 20 mLs in the mouth or throat daily as needed for mouth pain. Headache    . meclizine (ANTIVERT) 25 MG tablet Take 25 mg 3 (three) times daily as needed by mouth for dizziness.    . Multiple Vitamins-Minerals (ICAPS AREDS 2) CAPS Take 1 capsule by mouth 2 (two) times daily.    . potassium chloride SA (K-DUR,KLOR-CON) 20 MEQ tablet TAKE 1 TABLET BY MOUTH TWO  TIMES DAILY 180 tablet 2  . rivaroxaban (XARELTO) 20 MG TABS tablet Take 1 tablet (20 mg total) by mouth daily with supper. 90 tablet 1  . spironolactone (ALDACTONE) 25 MG tablet TAKE 1 TABLET BY MOUTH  DAILY 90 tablet 3   No current facility-administered medications for this visit.     Allergies:   Topamax [topiramate]; Codeine; Oxycodone; and Penicillins    Social History:  The patient  reports that she has never smoked. She has never used smokeless tobacco. She reports current alcohol use. She reports that she does not use drugs.   Family History:  [name] family history includes Atrial fibrillation in her mother; Breast cancer in her sister; Cancer in her maternal grandmother and maternal uncle; Heart disease in her sister; Hypertension in her father; Thyroid cancer in her sister.    ROS:   As noted in current hx. Otherwise negative   Physical Exam: Blood pressure 110/68, pulse 63, height 5\' 9"  (1.753 m), weight 241 lb 1.9 oz (109.4 kg), last menstrual period 01/21/1990, SpO2 95 %.  GEN:   Moderately obese, middle age female.  HEENT: Normal NECK: No JVD; No carotid bruits LYMPHATICS: No lymphadenopathy CARDIAC: RRR  RESPIRATORY:   Clear to auscultation without rales, wheezing or rhonchi  ABDOMEN: Soft, non-tender, non-distended MUSCULOSKELETAL:  No edema; No deformity  SKIN: Warm and dry NEUROLOGIC:  Alert and oriented x 3      Recent Labs: 07/23/2017: ALT 9; BUN 13; Creatinine, Ser 1.01; Magnesium 2.4; Potassium 4.7; Sodium 143    Lipid Panel    Component Value Date/Time   CHOL 134 07/23/2017 0847   TRIG 153 (H) 07/23/2017 0847   HDL 40 07/23/2017 0847   CHOLHDL 3.4 07/23/2017 0847   CHOLHDL 4 10/22/2013 1501   VLDL 32.6 10/22/2013 1501   LDLCALC 63 07/23/2017 0847      Wt Readings from Last 3 Encounters:  02/02/18 241 lb 1.9 oz (109.4 kg)  07/23/17 238 lb 1.9 oz (108 kg)  04/17/17 238 lb (108 kg)    ECG  February 02, 2018: Sinus bradycardia 58 beats minute.  QT corrected 420 ms.  Other studies Reviewed: Additional studies/ records that were reviewed today include: . Review of the above records demonstrates:    ASSESSMENT AND PLAN:  1. Atrial fibrillation -she is in sinus rhythm today.  She reports having an episode of atrial fibrillation this past Thursday.  She took 1 of her husbands propranolol tablets and converted to sinus rhythm fairly quickly.  We will give her some propranolol to take on her own. Continue Tikosyn.  Her QT C interval is normal.   2. Hyperlipidemia-     recent labs drawn by her primary medical doctor.  Her lipid levels look good.  3.  Chest discomfort: The patient describes some chest pain beneath her left breast.  Sometimes it feels like a tightness.  Is not necessarily related to exercise.  He does have  some chest wall tenderness which may be due to costochondritis. Having a little bit more shortness of breath when she has these episodes of tightness.  We will get a coronary CT angiogram for further evaluation.  4. Diastolic congestive heart failure -   Has some baseline DOE.  Have encouraged her to ambulate  .     Current medicines are reviewed at length with the  patient today.  The patient does not have concerns regarding medicines.  The following changes have been made:  See above.   Labs/ tests ordered today include:  No orders of the defined types were placed in this encounter.   Disposition:   FU with me in 6  months for OV,    Kristeen MissPhilip Breanne Olvera, MD  02/02/2018 8:34 AM    [facility] [address], [place], [place]  [address] Phone: [phone]) [phone]; Fax: [phone]) [phone]

## 2018-02-23 ENCOUNTER — Telehealth (HOSPITAL_COMMUNITY): Payer: Self-pay | Admitting: Emergency Medicine

## 2018-02-23 NOTE — Telephone Encounter (Signed)
Reaching out to patient to offer assistance regarding upcoming cardiac imaging study; pt verbalizes understanding of appt date/time, parking situation and where to check in, pre-test NPO status and medications ordered, and verified current allergies; name and call back number provided for further questions should they arise Zaray Gatchel RN Navigator Cardiac Imaging [facility] [phone] office [phone] cell 

## 2018-02-25 ENCOUNTER — Ambulatory Visit (HOSPITAL_COMMUNITY)
Admission: RE | Admit: 2018-02-25 | Discharge: 2018-02-25 | Disposition: A | Discharge status: Home / Self Care | Payer: Medicare Other | Source: Ambulatory Visit | Attending: Cardiovascular Disease | Admitting: Cardiovascular Disease

## 2018-02-25 DIAGNOSIS — R0602 Shortness of breath: Secondary | ICD-10-CM | POA: Insufficient documentation

## 2018-02-25 DIAGNOSIS — I4891 Unspecified atrial fibrillation: Secondary | ICD-10-CM | POA: Diagnosis not present

## 2018-02-25 DIAGNOSIS — R079 Chest pain, unspecified: Secondary | ICD-10-CM | POA: Diagnosis present

## 2018-02-25 DIAGNOSIS — E785 Hyperlipidemia, unspecified: Secondary | ICD-10-CM | POA: Diagnosis not present

## 2018-02-25 MED ORDER — IOPAMIDOL (ISOVUE-370) INJECTION 76%
80.0000 mL | Freq: Once | INTRAVENOUS | Status: AC | PRN
  Administered 2018-02-25: 80 mL via INTRAVENOUS

## 2018-02-25 MED ORDER — NITROGLYCERIN 0.4 MG SL SUBL
0.8000 mg | SUBLINGUAL_TABLET | Freq: Once | SUBLINGUAL | Status: AC
  Administered 2018-02-25: 0.8 mg via SUBLINGUAL

## 2018-02-25 MED ORDER — NITROGLYCERIN 0.4 MG SL SUBL
SUBLINGUAL_TABLET | SUBLINGUAL | Status: AC
  Filled 2018-02-25: qty 2

## 2018-02-25 NOTE — Progress Notes (Signed)
Ct complete. Patient denies any complaints. Offered patient snack and beverage.  

## 2018-03-05 ENCOUNTER — Ambulatory Visit: Payer: Medicare Other | Admitting: Pulmonary Disease

## 2018-03-05 ENCOUNTER — Encounter: Payer: Self-pay | Admitting: Pulmonary Disease

## 2018-03-05 DIAGNOSIS — G4733 Obstructive sleep apnea (adult) (pediatric): Secondary | ICD-10-CM

## 2018-03-05 DIAGNOSIS — R0602 Shortness of breath: Secondary | ICD-10-CM | POA: Diagnosis not present

## 2018-03-05 NOTE — Progress Notes (Signed)
   Subjective:    Patient ID: [name], female    DOB: Oct 23, 1950, 68 y.o.   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  She has atrial fibrillation controlled on Tikosyn. She has restrictive cardiomyopathy with negative cardiac biopsy at [facility] in the past. Essential tremor following with Neuro (neg aldolase/ck/acetylcholine recept)     Chief Complaint  Patient presents with  . Follow-up    OSA f/u; using CPAP, does not like the mask   She continues to have dyspnea mostly on exertion starting in January 2018 of acute onset with a negative cardiac evaluation. Symptoms seemed to have settled down over the past year until the flight of 01/2018 with an episode of atrial fibrillation and chest pain.  She saw cardiology and underwent cardiac CT which put her at low risk.  No other cause of chest pain was identified.  Dyspnea has persisted  Regarding OSA, she is still on adjusted to a full facemask, this rubs her cheek and annoys her since she has had some dental work in the house seems to move around a lot.  No problems with pressure. CPAP download shows excellent control of events on auto settings with average 10 cm and minimal leak and good compliance more than 6 hours every night  No objective sleep history available. Weight is unchanged in the past 2 years  Significant tests/ events  CPET 03/2016 no clear indication for cardiopulmonary limitation. At peak exercise and with flat O2 pulse pattern, patient is likely limited due to diastolic dysfunction as a result of her body habitus. Expect weight loss and regular exercise would greatly improve exercise intolerance.   PFTs 01/2016 showed ratio 72, FVC 74%, FEV1 69%, TLC 91% and DLCO 73% suggesting moderate intraparenchymal restriction sniff test >> no  diaphragmatic dysfunction   PSG 2008 at [facility] showed total sleep time 362 minutes and  a low AHI PSG 02/2016 showed mild OSA with AHI 8/hour  Echo 1/2018showed grade 2 diastolic dysfunction with RVSP 35  CT angiogram 09/2013 was negative for pulmonary emboli  Review of Systems neg for any significant sore throat, dysphagia, itching, sneezing, nasal congestion or excess/ purulent secretions, fever, chills, sweats, unintended wt loss, pleuritic or exertional cp, hempoptysis, orthopnea pnd or change in chronic leg swelling. Also denies presyncope, palpitations, heartburn, abdominal pain, nausea, vomiting, diarrhea or change in bowel or urinary habits, dysuria,hematuria, rash, arthralgias, visual complaints, headache, numbness weakness or ataxia.     Objective:   Physical Exam  Gen. Pleasant, obese, in no distress ENT - no lesions, no post nasal drip Neck: No JVD, no thyromegaly, no carotid bruits Lungs: no use of accessory muscles, no dullness to percussion, decreased without rales or rhonchi  Cardiovascular: Rhythm regular, heart sounds  normal, no murmurs or gallops, no peripheral edema Musculoskeletal: No deformities, no cyanosis or clubbing , no tremors       Assessment & Plan:

## 2018-03-06 NOTE — Assessment & Plan Note (Signed)
CPAP has subjectively helped improve her energy levels and daytime somnolence and we can continue. She is certainly compliant.  Auto CPAP settings are working well for her and we will continue this, average CPAP pressure seems to be 10 cm  Weight loss encouraged, compliance with goal of at least 4-6 hrs every night is the expectation. Advised against medications with sedative side effects Cautioned against driving when sleepy - understanding that sleepiness will vary on a day to day basis

## 2018-03-06 NOTE — Assessment & Plan Note (Signed)
Once again the cause of her dyspnea remains a mystery.  Prior C PET as indicated diastolic dysfunction or deconditioning as a cause.  She does not seem to have pulmonary limitation to exercise or pulmonary cause for her chest pain

## 2018-04-02 ENCOUNTER — Other Ambulatory Visit: Payer: Self-pay | Admitting: Cardiovascular Disease

## 2018-04-28 ENCOUNTER — Other Ambulatory Visit: Payer: Self-pay | Admitting: Cardiovascular Disease

## 2018-06-23 ENCOUNTER — Other Ambulatory Visit: Payer: Self-pay | Admitting: Cardiovascular Disease

## 2018-06-25 ENCOUNTER — Ambulatory Visit (INDEPENDENT_AMBULATORY_CARE_PROVIDER_SITE_OTHER): Payer: Medicare Other | Admitting: Obstetrics & Gynecology

## 2018-06-25 ENCOUNTER — Encounter: Payer: Self-pay | Admitting: Obstetrics & Gynecology

## 2018-06-25 ENCOUNTER — Other Ambulatory Visit: Payer: Self-pay

## 2018-06-25 VITALS — BP 118/70 | HR 64 | Temp 97.2°F | Ht 68.75 in | Wt 232.0 lb

## 2018-06-25 DIAGNOSIS — Z01419 Encounter for gynecological examination (general) (routine) without abnormal findings: Secondary | ICD-10-CM | POA: Diagnosis not present

## 2018-06-25 NOTE — Progress Notes (Signed)
68 y.o. G1P0 Married White or Caucasian female here for annual exam.  Has seen Dr. Vassie Loll and Dr. Melburn Popper in February and January, respectively.  She did have some SOB earlier this year.  Coronary CT was ok.   Symptoms just improved over time on own.    Denies vaginal bleeding.    Still having issues with vertigo.  Has not had bad episodes in the past year.  Husband had MI about a year and a half ago.  Doing much better.  He's lost some weight and HbA1C is ~6.  PCP:  Dr. Timothy Lasso.  Last appt was within the last six month.    Patient's last menstrual period was 01/21/1990.          Sexually active: No.  The current method of family planning is post menopausal status.    Exercising: Yes.    gardening  Smoker:  no  Health Maintenance: Pap:  04/17/17 Neg   12/30/14 neg. HR HPV:neg  History of abnormal Pap:  no MMG:  08/22/17 BIRADS1:neg  Colonoscopy:  Cologuard 2019 Normal with PCP (Dr. Timothy Lasso) BMD:   2011 TDaP:  2016 Pneumonia vaccine(s):  2017 Shingrix:   Has completed this Hep C testing: PCP Screening Labs: PCP   reports that she has never smoked. She has never used smokeless tobacco. She reports previous alcohol use. She reports that she does not use drugs.  Past Medical History:  Diagnosis Date  . Back pain    nerve problems-had series of three injections  . Chronic anticoagulation   . Diastolic congestive heart failure (HCC)   . Dyspnea   . Hyperlipemia   . Migraine headache   . Paroxysmal atrial fibrillation (HCC)   . Restrictive cardiomyopathy (HCC)   . Tubal pregnancy     Past Surgical History:  Procedure Laterality Date  . BREAST EXCISIONAL BIOPSY Left    benign  . BREAST LUMPECTOMY     benign  . CARDIAC CATHETERIZATION    . CARDIOVERSION    . CHOLECYSTECTOMY    . ECTOPIC PREGNANCY SURGERY  1980  . EYE SURGERY     left eye injections x2-was having "stroke like" flashes of light  . heart biopsy  2008   [facility]  . HYSTEROSCOPY  6/05   and D&C-simple hyperplasia  secondary PMP bleeding  . ROTATOR CUFF REPAIR     bilateral    Current Outpatient Medications  Medication Sig Dispense Refill  . atorvastatin (LIPITOR) 20 MG tablet TAKE 1 TABLET BY MOUTH  DAILY AT 6PM 90 tablet 3  . dofetilide (TIKOSYN) 125 MCG capsule TAKE 3 CAPSULES BY MOUTH  TWO TIMES DAILY 540 capsule 2  . ezetimibe (ZETIA) 10 MG tablet TAKE 1 TABLET BY MOUTH  DAILY 90 tablet 2  . fluticasone (FLONASE) 50 MCG/ACT nasal spray Place 1 spray daily into both nostrils.    . furosemide (LASIX) 40 MG tablet TAKE 1 TABLET BY MOUTH TWO  TIMES DAILY 180 tablet 2  . gabapentin (NEURONTIN) 600 MG tablet Take 900 mg by mouth at bedtime.     . Multiple Vitamins-Minerals (ICAPS AREDS 2) CAPS Take 1 capsule by mouth 2 (two) times daily.    . potassium chloride SA (K-DUR,KLOR-CON) 20 MEQ tablet TAKE 1 TABLET BY MOUTH TWO  TIMES DAILY 180 tablet 2  . propranolol (INDERAL) 10 MG tablet Take 1 tablet (10 mg total) by mouth 4 (four) times daily as needed (for atrial fibrillation). 30 tablet 6  . spironolactone (ALDACTONE) 25 MG tablet  TAKE 1 TABLET BY MOUTH  DAILY 90 tablet 1  . triamcinolone cream (KENALOG) 0.5 % APPLY TO AFFECTED AREA 2 3 A DAY    . XARELTO 20 MG TABS tablet TAKE 1 TABLET BY MOUTH  DAILY WITH SUPPER 90 tablet 1   No current facility-administered medications for this visit.     Family History  Problem Relation Age of Onset  . Hypertension Father   . Atrial fibrillation Mother   . Cancer Maternal Grandmother        mouth  . Cancer Maternal Uncle        unknown type  . Thyroid cancer Sister   . Breast cancer Sister        breast cancer  . Heart disease Sister     Review of Systems  All other systems reviewed and are negative.   Exam:   BP 118/70   Pulse 64   Temp (!) 97.2 F (36.2 C) (Temporal)   Ht 5' 8.75" (1.746 m)   Wt 232 lb (105.2 kg)   LMP 01/21/1990   BMI 34.51 kg/m    Height: 5' 8.75" (174.6 cm)  Ht Readings from Last 3 Encounters:  06/25/18 5' 8.75"  (1.746 m)  03/05/18 5' 9.5" (1.765 m)  02/02/18 5\' 9"  (1.753 m)    General appearance: alert, cooperative and appears stated age Head: Normocephalic, without obvious abnormality, atraumatic Neck: no adenopathy, supple, symmetrical, trachea midline and thyroid normal to inspection and palpation Lungs: clear to auscultation bilaterally Breasts: normal appearance, no masses or tenderness Heart: regular rate and rhythm Abdomen: soft, non-tender; bowel sounds normal; no masses,  no organomegaly Extremities: extremities normal, atraumatic, no cyanosis or edema Skin: Skin color, texture, turgor normal. No rashes or lesions Lymph nodes: Cervical, supraclavicular, and axillary nodes normal. No abnormal inguinal nodes palpated Neurologic: Grossly normal   Pelvic: External genitalia:  no lesions              Urethra:  normal appearing urethra with no masses, tenderness or lesions              Bartholins and Skenes: normal                 Vagina: normal appearing vagina with normal color and discharge, no lesions              Cervix: no lesions              Pap taken: No. Bimanual Exam:  Uterus:  normal size, contour, position, consistency, mobility, non-tender              Adnexa: normal adnexa and no mass, fullness, tenderness               Rectovaginal: Confirms               Anus:  normal sphincter tone, no lesions  Chaperone was present for exam.  A:  Well Woman with normal exam PMP, no HRT H/o paroxysmal atrial fibrillation, on ASA and Xarelto H/O heart failure of unknown etiology.  Sees Dr. Melburn Popper every six months. H/o abdominal hernia (did have general surgery consultation and declines surgical repair) Increased eye pressure--has appt with specialist this summer  P:   Mammogram guidelines reviewed.  Doing 3D due to breast density. pap smear obtained 2019.  Not indicated today. Lab work and vaccines are UTD with Dr. Timothy Lasso. return annually or prn

## 2018-07-29 IMAGING — RF DG SNIFF TEST
4 series · 16 of 16 positions shown · non-contrast
Comparison: Chest x-ray 09/30/2013

CLINICAL DATA: Dyspnea.  Evaluate diaphragm weakness

EXAM:
CHEST FLUOROSCOPY
TECHNIQUE: Real-time fluoroscopic evaluation of the chest was performed.
FLUOROSCOPY TIME:  Fluoroscopy Time:  1 minutes and 0 seconds
Radiation Exposure Index (if provided by the fluoroscopic device):
Number of Acquired Spot Images: 0

[Series 1: cp_chest · 0.61mm/px · 4 of 95 frames shown (1 of 4)]
[frame 9/95]
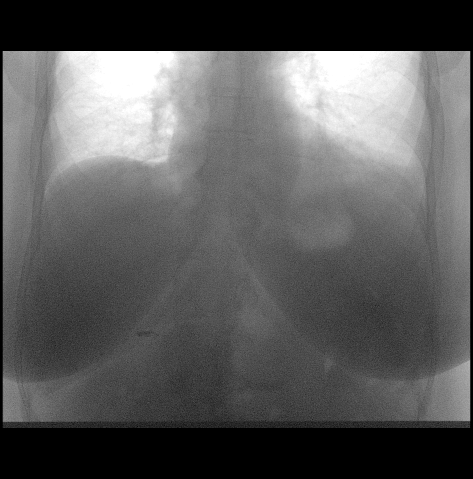
[frame 15/95]
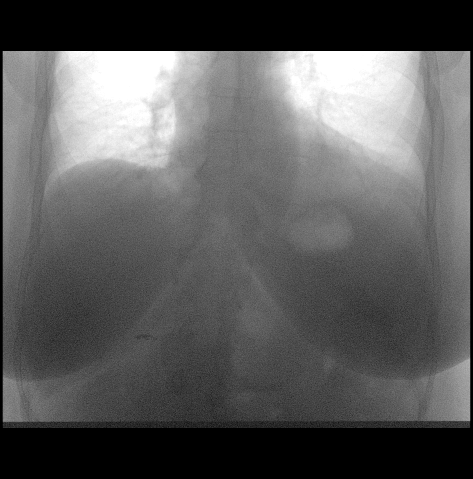
[frame 48/95]
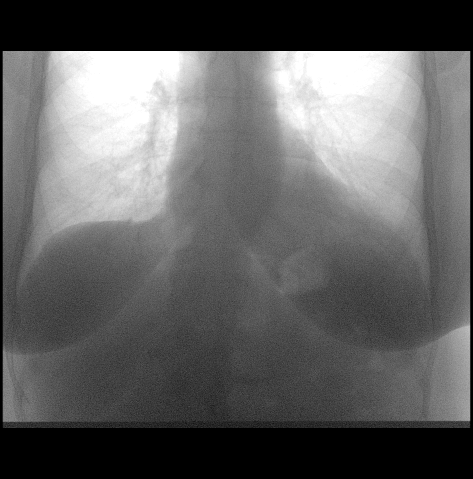
[frame 81/95]
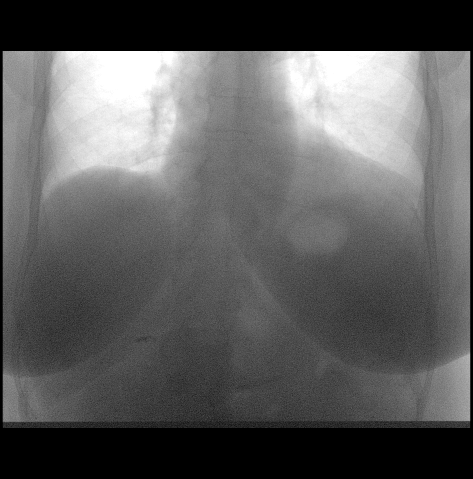

[Series 2: cp_chest · 0.61mm/px · 4 of 118 frames shown (2 of 4)]
[frame 6/118]
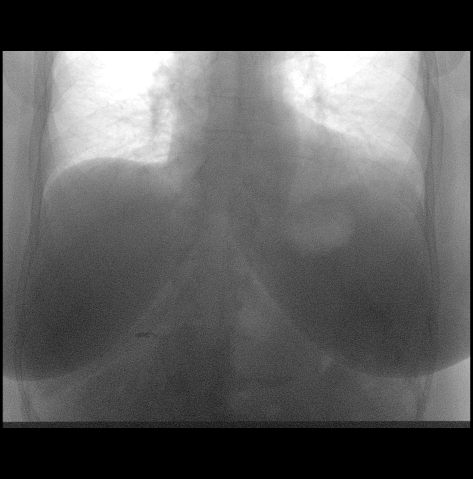
[frame 18/118]
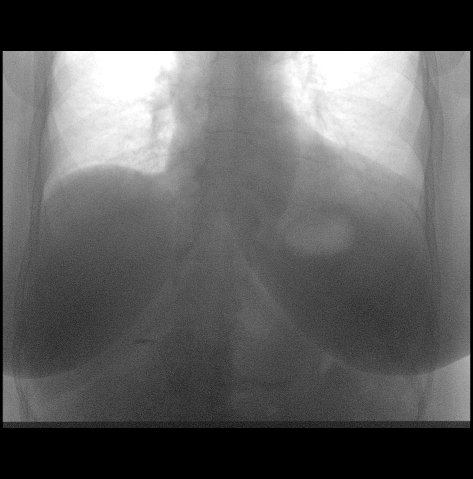
[frame 60/118]
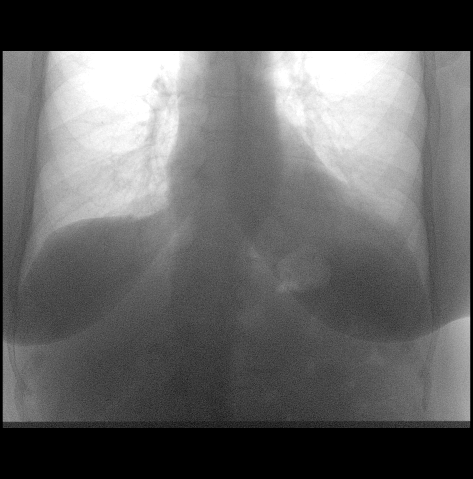
[frame 101/118]
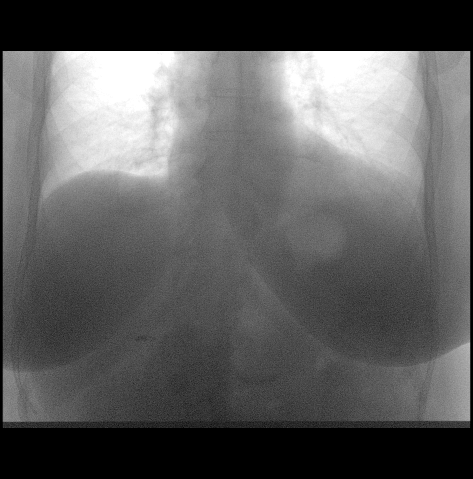

[Series 3: cp_chest · 0.61mm/px · 4 of 116 frames shown (3 of 4)]
[frame 18/116]
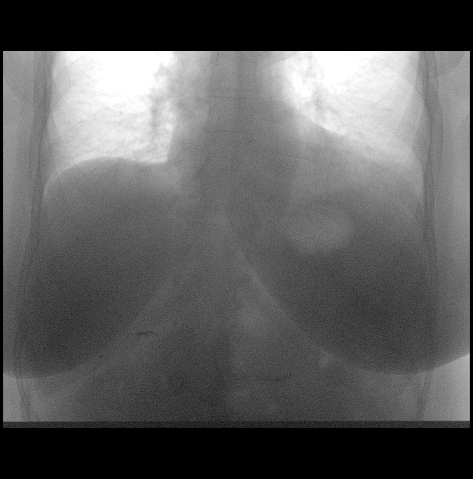
[frame 29/116]
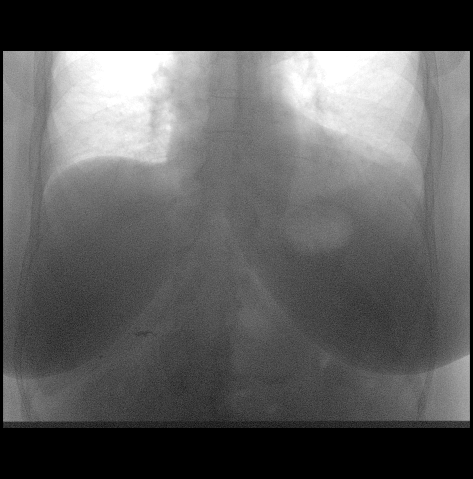
[frame 59/116]
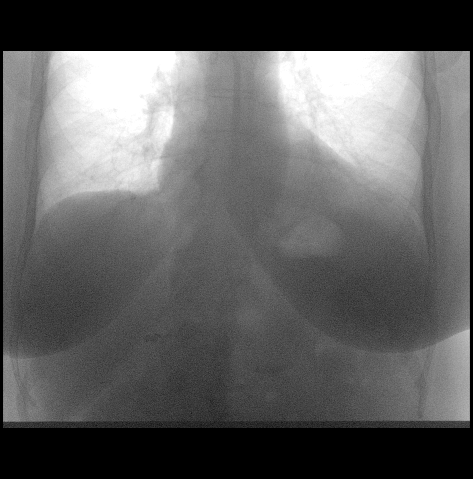
[frame 99/116]
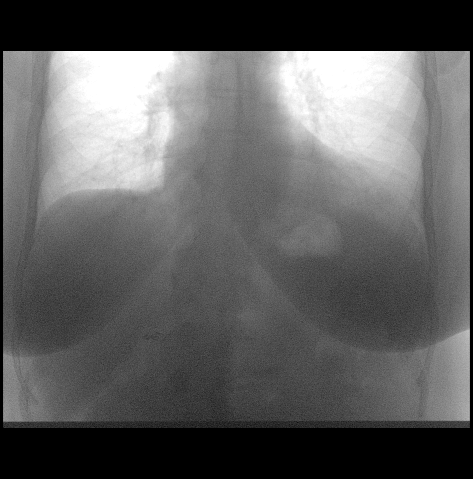

[Series 4: cp_chest · 0.61mm/px · 4 of 105 frames shown (4 of 4)]
[frame 7/105]
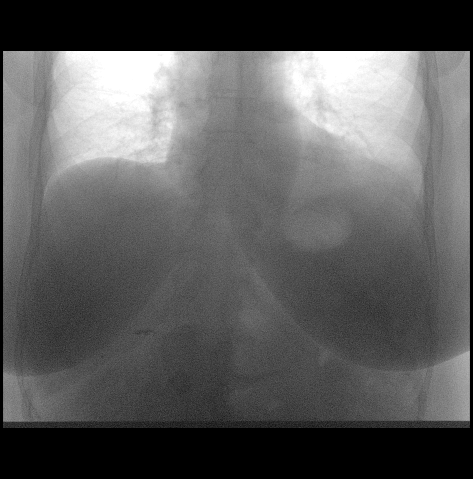
[frame 16/105]
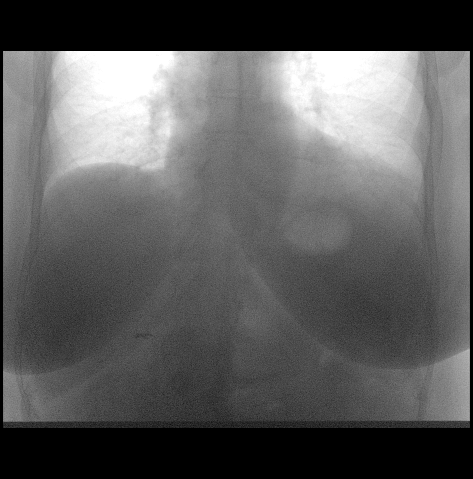
[frame 53/105]
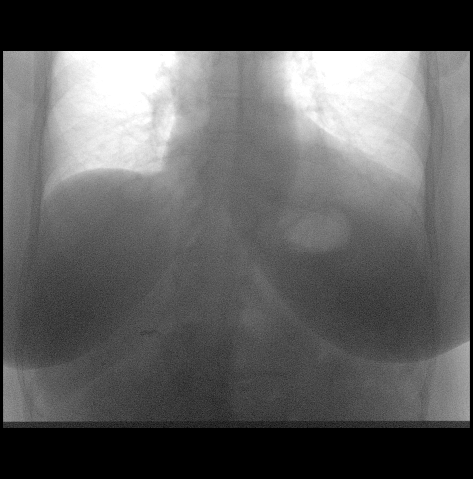
[frame 90/105]
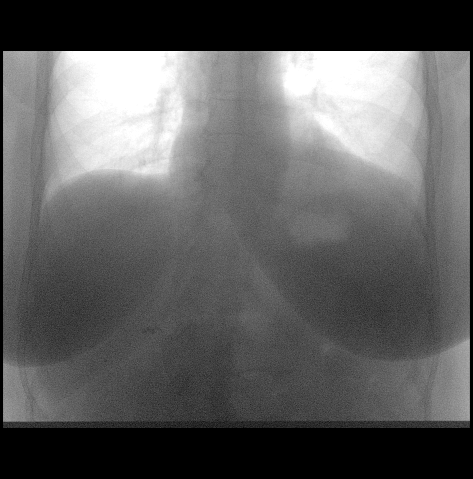

[16 of 16 positions shown; findings below may reference images not displayed]

FINDINGS: Normal diaphragmatic movement at fluoroscopy. Diaphragm moves
appropriately on deep breathing, sniffing, and coughing. No
paradoxical movement. No weakness of the diaphragm. The diaphragms
move symmetrically.
IMPRESSION: Normal diaphragmatic function.

## 2018-09-22 ENCOUNTER — Other Ambulatory Visit: Payer: Self-pay | Admitting: Cardiovascular Disease

## 2018-09-23 NOTE — Telephone Encounter (Signed)
Pt last saw Dr Acie Fredrickson 02/02/18, last labs 02/02/18 Creat 0.92, age 68, weight 105.2kg, CrCl 97.2, based on CrCl pt is on appropriate dosage of Xarelto 20mg  QD.  Will refill rx.

## 2018-10-05 NOTE — Progress Notes (Signed)
Ago after he was electromyelographic of tracer down if there is anything wrong with it otherwise she could go okay  Cardiology Office Note   Date:  10/06/2018   ID:  [name], DOB 1950-09-22, MRN [id number]  PCP:  Creola Cornusso, [name], MD  Cardiologist:   Kristeen MissPhilip Kaytlynne Neace, MD he had his I thought it would look 5 is just we should check at least twice a year mention of the dyscrasias:  Chief Complaint  Patient presents with  . Congestive Heart Failure  . Atrial Fibrillation   1. Atrial fibrillation 2. Hyperlipidemia 3. Diastolic congestive heart failure  68 year old female with a history of atrial fibrillation. She's been well controlled on Tikosyn. She also has a history of hyperlipidemia and is on Lipitor. She's been on chronic Pradaxa therapy for anticoagulation.  February 20, 2012 [name] has done well since I last saw her. She retired from the [employer] last summer. She has occasional palpitations but overall she's doing quite well. She is exercising ( water aerobics) twice a week.  Feb. 24, 2015:  [name] is doing well. No problems. Maintaining NSR.  Sept. 16, 2015:  She was in the ER this past week. CP, dyspnea .. CT angio was negative for PE - showed ? Of pneumonia. Was treated wit Abx. Does well in the am's .[name]. Fatigued and perhaps more short of breath in the afternoons.   April 05, 2014: [name] is a 68 y.o. female who presents for follow-up of her atrial fibrillation. She has continued to have some DOE,  Is concerned about the atorvastatin. Was seen in the lipid clinic.    Sept. 30, 2016:  Doing well.  No CP , no dyspnea. Has lost 11 lbs since last year.  Is in NSR  April 10, 2015:  [name] is doing ok from a cardiology standpoint. Has had some dental issues   Sept.  22, 2017:  [name] is seen for follow up of her atrial fib.    No  CP or dyspnea. No further episodes of atrial fib.   Dec. 11, 2017:  [name] is seen  today for followup for her atrial fib  Has some DOE  Not exercising as much as she needs to  No CP   Jan. 16, 2018: [name] is seen today as a work in visit at the request of Dr. Timothy Lassousso. Been having more shortness of breath and was thought to have worsening congestive heart failure.  Has severe DOE with any exertion. We performed a cardiac cath in 2008 which revealed normal coronaries.   She went to [facility] and had a heart biopsy.   Was not told of any abnormality.  Does have dull chest pain with exertion and with sitting   She had an echocardiogram performed yesterday which revealed normal left ventricular systolic function. She does have grade 2 diastolic dysfunction. Her Lasix was increased last week.   She has not noticed any increase in urine output.  No change in her breathing .  Has noticed that she loses her breath if she does any exertion .  Cannot lay supine - has severe dyspnea.   April 02, 2016:  [name] is seen today for follow up visit .    Seen with husband , [name] RuizJohn .  She had an episode of PAF yesterday  - lasted for several hours.   Is back in NSR currently .  Had an episode in Feb. That lasted 2-3 hours , HR of 129 ,  She was very short of breath with that.   -Sleep study on Feb. 20 shows mild OSA -PFTs 01/2016 showed ratio 72, FVC 74%, FEV1 69%, TLC 91% and DLCO 73% suggesting moderate intraparenchymal restriction  - echo shows normal LV systolic function with grade 2 diastolic dysfunction. She has an estimated PA pressure of 35.  Has developed a tremor.   Is seeing a neurologist .   July 10, 2016:  Had a CPX in March , Normal functional capacity compared to sedentary patients.  Still has some vocal issues ( when she gets fatigued, she loses her voice) struggles to talk  Able to get out and work in the garden Using CPAP - may have helped some   Dec. 19, 2018:  [name] is seen today for follow up of her chronic diastolic CHF [name] was seen [facility] earlier this year.  She had a heart catheterization which revealed normal coronary arteries.  She has had a myocardial biopsy which was reportedly normal. History of paroxysmal atrial fibrillation which seems to be well-controlled on  Tikosyn  PFTs has shown reduction of DLCO ( 73% pred.)   Is just getting over al cold  Has continued to have issues with vertigo  Not getting out to do any exercise   July 23, 2017:  Doing well,  No CP or dyspnea. Gets short of breath with the heat.  Started getting some exercise, yoga .  No cardiac exercise  Is having some joint issues,     February 02, 2018: He is seen today for follow-up visit.  She has a history of atrial fibrillation. She remains in normal sinus rhythm on Tikosyn.   She had an episode of paroxysmal atrial fibrillation this past Thursday.  The episode lasted for about 3 hours.   She took 1 of [name]'s propranolol tablets and went back into sinus rhythm fairly quickly.  Her QT intervals have remained good.  Sept. 15, 2020   [name] is seen today . caregiving for [name]'s sister.  Wt. Today is  222 ( previous wt from Jan. 2020 was 241 lbs)   Walking some , eating fewer carbs.      Wt Readings from Last 3 Encounters:  10/06/18 222 lb 6.4 oz (100.9 kg)  06/25/18 232 lb (105.2 kg)  03/05/18 240 lb 12.8 oz (109.2 kg)      Past Medical History:  Diagnosis Date  . Back pain    nerve problems-had series of three injections  . Chronic anticoagulation   . Diastolic congestive heart failure (HCC)   . Dyspnea   . Hyperlipemia   . Migraine headache   . Paroxysmal atrial fibrillation (HCC)   . Restrictive cardiomyopathy (HCC)   . Tubal pregnancy     Past Surgical History:  Procedure Laterality Date  . BREAST EXCISIONAL BIOPSY Left    benign  . BREAST LUMPECTOMY     benign  . CARDIAC CATHETERIZATION    . CARDIOVERSION    . CHOLECYSTECTOMY    . ECTOPIC PREGNANCY SURGERY  1980  . EYE SURGERY     left eye  injections x2-was having "stroke like" flashes of light  . heart biopsy  2008   [facility]  . HYSTEROSCOPY  6/05   and D&C-simple hyperplasia secondary PMP bleeding  . ROTATOR CUFF REPAIR     bilateral     Current Outpatient Medications  Medication Sig Dispense Refill  . atorvastatin (LIPITOR) 20 MG tablet TAKE 1 TABLET BY MOUTH  DAILY AT 6PM 90 tablet 3  . dofetilide (TIKOSYN) 125 MCG capsule TAKE 3 CAPSULES BY MOUTH  TWO TIMES DAILY 540 capsule 0  . ezetimibe (ZETIA) 10 MG tablet TAKE 1 TABLET BY MOUTH  DAILY 90 tablet 2  . fluticasone (FLONASE) 50 MCG/ACT nasal spray Place 1 spray daily into both nostrils.    . furosemide (LASIX) 40 MG tablet TAKE 1 TABLET BY MOUTH TWO  TIMES DAILY 180 tablet 2  . gabapentin (NEURONTIN) 600 MG tablet Take 900 mg by mouth at bedtime.     . Multiple Vitamins-Minerals (ICAPS AREDS 2) CAPS Take 1 capsule by mouth 2 (two) times daily.    . potassium chloride SA (K-DUR,KLOR-CON) 20 MEQ tablet TAKE 1 TABLET BY MOUTH TWO  TIMES DAILY 180 tablet 2  . propranolol (INDERAL) 10 MG tablet Take 1 tablet (10 mg total) by mouth 4 (four) times daily as needed (for atrial fibrillation). 30 tablet 6  . spironolactone (ALDACTONE) 25 MG tablet TAKE 1 TABLET BY MOUTH  DAILY 90 tablet 1  . triamcinolone cream (KENALOG) 0.5 % APPLY TO AFFECTED AREA 2 3 A DAY    . XARELTO 20 MG TABS tablet TAKE 1 TABLET BY MOUTH  DAILY WITH SUPPER 90 tablet 1   No current facility-administered medications for this visit.     Allergies:   Topamax [topiramate], Codeine, Oxycodone, and Penicillins    Social History:  The patient  reports that she has never smoked. She has never used smokeless tobacco. She reports previous alcohol use. She reports that she does not use drugs.   Family History:  [name] family history includes Atrial fibrillation in her mother; Breast cancer in her sister; Cancer in her maternal grandmother and maternal uncle; Heart disease in her sister; Hypertension in her  father; Thyroid cancer in her sister.    ROS:   As noted in current hx. Otherwise negative    Physical Exam: Blood pressure 112/74, pulse 64, height 5\' 9"  (1.753 m), weight 222 lb 6.4 oz (100.9 kg), last menstrual period 01/21/1990, SpO2 97 %.  GEN:  Well nourished, well developed in no acute distress HEENT: Normal NECK: No JVD; No carotid bruits LYMPHATICS: No lymphadenopathy CARDIAC: RRR , no murmurs, rubs, gallops RESPIRATORY:  Clear to auscultation without rales, wheezing or rhonchi  ABDOMEN: Soft, non-tender, non-distended MUSCULOSKELETAL:  No edema; No deformity  SKIN: Warm and dry NEUROLOGIC:  Alert and oriented x 3      Recent Labs: 02/02/2018: BUN 19; Creatinine, Ser 0.92; Magnesium 2.2; Potassium 4.1; Sodium 140    Lipid Panel    Component Value Date/Time   CHOL 134 07/23/2017 0847   TRIG 153 (H) 07/23/2017 0847   HDL 40 07/23/2017 0847   CHOLHDL 3.4 07/23/2017 0847   CHOLHDL 4 10/22/2013 1501   VLDL 32.6 10/22/2013 1501   LDLCALC 63 07/23/2017 0847      Wt Readings from Last 3 Encounters:  10/06/18 222 lb 6.4 oz (100.9 kg)  06/25/18 232 lb (105.2 kg)  03/05/18 240 lb 12.8 oz (109.2 kg)      ECG : Sinus bradycardia at 59 beats minute.  Low voltage QRS.  QT C is 423 ms.   Other studies Reviewed: Additional studies/ records that were reviewed today include: . Review of the above records demonstrates:    ASSESSMENT AND PLAN:  1. Atrial fibrillation -in NSR , continue tikosyn.   BMP and MAg level today .  ECG looks good    2. Hyperlipidemia-  Managed by primary    3.  Chest discomfort:  No further epissodes of CP   4. Diastolic congestive heart failure -   Doing great.  .     Current medicines are reviewed at length with the patient today.  The patient does not have concerns regarding medicines.  The following changes have been made:  See above.   Labs/ tests ordered today include:   Orders Placed This Encounter  Procedures  . Basic  metabolic panel  . Magnesium  . EKG 12-Lead    Disposition:   FU with me in 6  months for OV,    Kristeen MissPhilip Hafsah Hendler, MD  10/06/2018 4:24 PM    [facility] [address], [place], [place]  [address] Phone: [phone]; Fax: [phone]

## 2018-10-06 ENCOUNTER — Encounter: Payer: Self-pay | Admitting: Cardiovascular Disease

## 2018-10-06 ENCOUNTER — Other Ambulatory Visit: Payer: Self-pay

## 2018-10-06 ENCOUNTER — Ambulatory Visit (INDEPENDENT_AMBULATORY_CARE_PROVIDER_SITE_OTHER): Payer: Medicare Other | Admitting: Cardiovascular Disease

## 2018-10-06 VITALS — BP 112/74 | HR 64 | Ht 69.0 in | Wt 222.4 lb

## 2018-10-06 DIAGNOSIS — I5032 Chronic diastolic (congestive) heart failure: Secondary | ICD-10-CM | POA: Diagnosis not present

## 2018-10-06 DIAGNOSIS — I480 Paroxysmal atrial fibrillation: Secondary | ICD-10-CM

## 2018-10-06 NOTE — Patient Instructions (Addendum)
Medication Instructions:  Your physician recommends that you continue on your current medications as directed. Please refer to the Current Medication list given to you today.  If you need a refill on your cardiac medications before your next appointment, please call your pharmacy.   Lab work: BMET and Magnesium today  If you have labs (blood work) drawn today and your tests are completely normal, you will receive your results only by: [name] MyChart Message (if you have MyChart) OR . A paper copy in the mail If you have any lab test that is abnormal or we need to change your treatment, we will call you to review the results.  Testing/Procedures: None  Follow-Up: At [facility], you and your health needs are our priority.  As part of our continuing mission to provide you with exceptional heart care, we have created designated Provider Care Teams.  These Care Teams include your primary Cardiologist (physician) and Advanced Practice Providers (APPs -  Physician Assistants and Nurse Practitioners) who all work together to provide you with the care you need, when you need it. You will need a follow up appointment in:  6 months.  Please call our office 2 months in advance to schedule this appointment.  You may see Mertie Moores, MD or one of the following Advanced Practice Providers on your designated Care Team: Richardson Dopp, PA-C [place], [place] . Daune Perch, NP  Any Other Special Instructions Will Be Listed Below (If Applicable).

## 2018-10-07 LAB — BASIC METABOLIC PANEL
BUN/Creatinine Ratio: 22 (ref 12–28)
BUN: 19 mg/dL (ref 8–27)
CO2: 25 mmol/L (ref 20–29)
Calcium: 9.6 mg/dL (ref 8.7–10.3)
Chloride: 99 mmol/L (ref 96–106)
Creatinine, Ser: 0.87 mg/dL (ref 0.57–1.00)
GFR calc Af Amer: 79 mL/min/{1.73_m2} (ref >59)
GFR calc non Af Amer: 69 mL/min/{1.73_m2} (ref >59)
Glucose: 104 mg/dL — ABNORMAL HIGH (ref 65–99)
Potassium: 4.3 mmol/L (ref 3.5–5.2)
Sodium: 141 mmol/L (ref 134–144)

## 2018-10-07 LAB — MAGNESIUM: Magnesium: 2.4 mg/dL — ABNORMAL HIGH (ref 1.6–2.3)

## 2018-10-14 ENCOUNTER — Other Ambulatory Visit: Payer: Self-pay | Admitting: Obstetrics & Gynecology

## 2018-10-14 DIAGNOSIS — Z1231 Encounter for screening mammogram for malignant neoplasm of breast: Secondary | ICD-10-CM

## 2018-10-20 ENCOUNTER — Ambulatory Visit
Admission: RE | Admit: 2018-10-20 | Discharge: 2018-10-20 | Disposition: A | Discharge status: Home / Self Care | Payer: Medicare Other | Source: Ambulatory Visit | Attending: Obstetrics & Gynecology | Admitting: Obstetrics & Gynecology

## 2018-10-20 ENCOUNTER — Other Ambulatory Visit: Payer: Self-pay

## 2018-10-20 DIAGNOSIS — Z1231 Encounter for screening mammogram for malignant neoplasm of breast: Secondary | ICD-10-CM

## 2018-11-10 ENCOUNTER — Other Ambulatory Visit: Payer: Self-pay | Admitting: Cardiovascular Disease

## 2018-11-11 ENCOUNTER — Other Ambulatory Visit: Payer: Self-pay | Admitting: Cardiovascular Disease

## 2019-01-20 ENCOUNTER — Telehealth: Payer: Self-pay | Admitting: Orthopaedic Surgery

## 2019-01-20 NOTE — Telephone Encounter (Signed)
Patient dropped off authorization for records to go Dr. Vertell Limber. She mistakenly  paid 10.00 cash. IC and spoke with pt. Advised there was no fee, asked if she wanted to pick up money or I could mail it. She asked it mailed. I verified address and mailed to pt.

## 2019-01-26 ENCOUNTER — Other Ambulatory Visit: Payer: Self-pay | Admitting: *Deleted

## 2019-01-26 MED ORDER — ATORVASTATIN CALCIUM 20 MG PO TABS: ORAL_TABLET | ORAL | 0 refills | Status: DC

## 2019-02-11 DIAGNOSIS — N1831 Chronic kidney disease, stage 3a: Secondary | ICD-10-CM | POA: Insufficient documentation

## 2019-02-27 ENCOUNTER — Other Ambulatory Visit: Payer: Self-pay | Admitting: Neurosurgery

## 2019-02-27 DIAGNOSIS — M5416 Radiculopathy, lumbar region: Secondary | ICD-10-CM

## 2019-02-28 ENCOUNTER — Ambulatory Visit: Payer: Medicare PPO | Attending: Internal Medicine

## 2019-02-28 DIAGNOSIS — Z23 Encounter for immunization: Secondary | ICD-10-CM

## 2019-02-28 NOTE — Progress Notes (Signed)
   Covid-19 Vaccination Clinic  Name:  [name]    MRN: [id number] DOB: [birth year]  02/28/2019  [name] was observed post Covid-19 immunization for 15 minutes without incidence. She was provided with Vaccine Information Sheet and instruction to access the V-Safe system.   [name] was instructed to call 911 with any severe reactions post vaccine: [name] Difficulty breathing  . Swelling of your face and throat  . A fast heartbeat  . A bad rash all over your body  . Dizziness and weakness    Immunizations Administered    Name Date Dose VIS Date Route   Pfizer COVID-19 Vaccine 02/28/2019 10:17 AM 0.3 mL 01/01/2019 Intramuscular   Manufacturer: [employer], [employer]   Lot: [id number]   NDC: [address]-0

## 2019-03-13 ENCOUNTER — Ambulatory Visit
Admission: RE | Admit: 2019-03-13 | Discharge: 2019-03-13 | Disposition: A | Discharge status: Home / Self Care | Payer: Medicare PPO | Source: Ambulatory Visit | Attending: Neurosurgery | Admitting: Neurosurgery

## 2019-03-13 DIAGNOSIS — M5416 Radiculopathy, lumbar region: Secondary | ICD-10-CM

## 2019-03-15 ENCOUNTER — Ambulatory Visit: Payer: Medicare Other

## 2019-03-24 ENCOUNTER — Ambulatory Visit: Payer: Medicare PPO | Attending: Internal Medicine

## 2019-03-24 DIAGNOSIS — Z23 Encounter for immunization: Secondary | ICD-10-CM | POA: Insufficient documentation

## 2019-03-24 NOTE — Progress Notes (Signed)
   Covid-19 Vaccination Clinic  Name:  [name]    MRN: [id number] DOB: [birth year]  03/24/2019  [name] was observed post Covid-19 immunization for 15 minutes without incident. She was provided with Vaccine Information Sheet and instruction to access the V-Safe system.   [name] was instructed to call 911 with any severe reactions post vaccine: [name] Difficulty breathing  . Swelling of face and throat  . A fast heartbeat  . A bad rash all over body  . Dizziness and weakness   Immunizations Administered    Name Date Dose VIS Date Route   Pfizer COVID-19 Vaccine 03/24/2019  8:51 AM 0.3 mL 01/01/2019 Intramuscular   Manufacturer: [employer], [employer]   Lot: [id number]   NDC: [address]-6

## 2019-03-29 DIAGNOSIS — H9313 Tinnitus, bilateral: Secondary | ICD-10-CM | POA: Diagnosis not present

## 2019-03-29 DIAGNOSIS — H903 Sensorineural hearing loss, bilateral: Secondary | ICD-10-CM | POA: Diagnosis not present

## 2019-03-31 ENCOUNTER — Telehealth: Payer: Self-pay | Admitting: *Deleted

## 2019-03-31 DIAGNOSIS — M545 Low back pain: Secondary | ICD-10-CM | POA: Diagnosis not present

## 2019-03-31 DIAGNOSIS — M4126 Other idiopathic scoliosis, lumbar region: Secondary | ICD-10-CM | POA: Diagnosis not present

## 2019-03-31 DIAGNOSIS — M5126 Other intervertebral disc displacement, lumbar region: Secondary | ICD-10-CM | POA: Diagnosis not present

## 2019-03-31 DIAGNOSIS — M5416 Radiculopathy, lumbar region: Secondary | ICD-10-CM | POA: Diagnosis not present

## 2019-03-31 NOTE — Telephone Encounter (Signed)
   Primary Cardiologist: Kristeen Miss, MD  Chart reviewed as part of pre-operative protocol coverage. Per phone call with Ms. Klutts today, she is doing well from a cardiac standpoint.   Pt takes Xarelto for afib with CHADS2VASc score of 3 (age, sex, CHF). Renal function is normal. Ok to hold Xarelto for 3 days prior to [facility] per protocol.  I will route this recommendation to the requesting party via Epic fax function and remove from pre-op pool.  Please call with questions.  Berton Bon, NP 03/31/2019, 4:41 PM

## 2019-03-31 NOTE — Telephone Encounter (Signed)
Pt takes Xarelto for afib with CHADS2VASc score of 3 (age, sex, CHF). Renal function is normal. Ok to hold Xarelto for 3 days prior to [facility] per protocol.

## 2019-03-31 NOTE — Telephone Encounter (Signed)
   [facility] Pre-operative Risk Assessment    Request for surgical clearance:  1. What type of surgery is being performed? LUMBAR EPIDURAL STEROID INJECTION   2. When is this surgery scheduled? 05/07/19   3. What type of clearance is required (medical clearance vs. Pharmacy clearance to hold med vs. Both)? PHARM  4. Are there any medications that need to be held prior to surgery and how long? XARELTO X 3 DAYS PRIOR TO INJECTION   5. Practice name and name of physician performing surgery? [facility]; DR. PAUL HARKINS   6. What is your office phone number [phone]   7.   What is your office fax number [phone]  8.   Anesthesia type (None, local, MAC, general) ? NONE LISTED   [name] 03/31/2019, 2:40 PM  _________________________________________________________________   (provider comments below)

## 2019-04-08 DIAGNOSIS — M5416 Radiculopathy, lumbar region: Secondary | ICD-10-CM | POA: Diagnosis not present

## 2019-04-12 ENCOUNTER — Encounter: Payer: Self-pay | Admitting: Cardiovascular Disease

## 2019-04-12 NOTE — Progress Notes (Signed)
Ago after he was electromyelographic of tracer down if there is anything wrong with it otherwise she could go okay  Cardiology Office Note   Date:  04/13/2019   ID:  JEANETT ANTONOPOULOS, DOB 08/02/1950, MRN [id number]  PCP:  Creola Corn, MD  Cardiologist:   Kristeen Miss, MD he had his I thought it would look 5 is just we should check at least twice a year mention of the dyscrasias:  Chief Complaint  Patient presents with  . Atrial Fibrillation   1. Atrial fibrillation 2. Hyperlipidemia 3. Diastolic congestive heart failure  69 year old female with a history of atrial fibrillation. She's been well controlled on Tikosyn. She also has a history of hyperlipidemia and is on Lipitor. She's been on chronic Pradaxa therapy for anticoagulation.  February 20, 2012 [name] has done well since I last saw her. 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 She has occasional palpitations but overall she's doing quite well. She is exercising ( water aerobics) twice a week.  Feb. 24, 2015:  [name] is doing well. No problems. Maintaining NSR.  Sept. 16, 2015:  She was in the ER this past week. CP, dyspnea .. CT angio was negative for PE - showed ? Of pneumonia. Was treated wit Abx. Does well in the am's .[name] and perhaps more short of breath in the afternoons.   April 05, 2014: ROSELY FERNANDEZ is a 69 y.o. female who presents for follow-up of her atrial fibrillation. She has continued to have some DOE,  Is concerned about the atorvastatin. Was seen in the lipid clinic.    Sept. 30, 2016:  Doing well.  No CP , no dyspnea. Has lost 11 lbs since last year.  Is in NSR  April 10, 2015:  [name] is doing ok from a cardiology standpoint. Has had some dental issues   Sept.  22, 2017:  [name] is seen for follow up of her atrial fib.    No  CP or dyspnea. No further episodes of atrial fib.   Dec. 11, 2017:  [name] is seen today for followup for her  atrial fib  Has some DOE  Not exercising as much as she needs to  No CP   Jan. 16, 2018: [name] is seen today as a work in visit at the request of Dr. Timothy Lasso. Been having more shortness of breath and was thought to have worsening congestive heart failure.  Has severe DOE with any exertion. We performed a cardiac cath in 2008 which revealed normal coronaries.   She went to [facility] and had a heart biopsy.   Was not told of any abnormality.  Does have dull chest pain with exertion and with sitting   She had an echocardiogram performed yesterday which revealed normal left ventricular systolic function. She does have grade 2 diastolic dysfunction. Her Lasix was increased last week.   She has not noticed any increase in urine output.  No change in her breathing .  Has noticed that she loses her breath if she does any exertion .  Cannot lay supine - has severe dyspnea.   April 02, 2016:  [name] is seen today for follow up visit .    Seen with husband , [name] .  She had an episode of PAF yesterday  - lasted for several hours.   Is back in NSR currently .  Had an episode in Feb. That lasted 2-3 hours , HR of 129 ,  She was very  short of breath with that.   -Sleep study on Feb. 20 shows mild OSA -PFTs 01/2016 showed ratio 72, FVC 74%, FEV1 69%, TLC 91% and DLCO 73% suggesting moderate intraparenchymal restriction  - echo shows normal LV systolic function with grade 2 diastolic dysfunction. She has an estimated PA pressure of 35.  Has developed a tremor.   Is seeing a neurologist .   July 10, 2016:  Had a CPX in March , Normal functional capacity compared to sedentary patients.  Still has some vocal issues ( when she gets fatigued, she loses her voice) struggles to talk  Able to get out and work in the garden Using CPAP - may have helped some   Dec. 19, 2018:  Kennyth Lose is seen today for follow up of her chronic diastolic CHF Kennyth Lose was seen at [facility] earlier this  year.  She had a heart catheterization which revealed normal coronary arteries.  She has had a myocardial biopsy which was reportedly normal. History of paroxysmal atrial fibrillation which seems to be well-controlled on  Tikosyn  PFTs has shown reduction of DLCO ( 73% pred.)   Is just getting over al cold  Has continued to have issues with vertigo  Not getting out to do any exercise   July 23, 2017:  Doing well,  No CP or dyspnea. Gets short of breath with the heat.  Started getting some exercise, yoga .  No cardiac exercise  Is having some joint issues,     February 02, 2018: He is seen today for follow-up visit.  She has a history of atrial fibrillation. She remains in normal sinus rhythm on Tikosyn.   She had an episode of paroxysmal atrial fibrillation this past Thursday.  The episode lasted for about 3 hours.   She took 1 of [name]'s propranolol tablets and went back into sinus rhythm fairly quickly.  Her QT intervals have remained good.  Sept. 15, 2020   Kennyth Lose is seen today . caregiving for [name]'s sister.  Wt. Today is  222 ( previous wt from Jan. 2020 was 241 lbs)   Walking some , eating fewer carbs.   April 13, 2019  Kennyth Lose is seen today for follow up of her atrial fib and chronic diastilic CHF Wt is 469 lbs.  ( up 3 lbs )  Has chronic diastolic CHF Had a spinal injection last week..   Still has back and leg pain with walking  Has not been able to exercise.      Wt Readings from Last 3 Encounters:  04/13/19 225 lb 12 oz (102.4 kg)  10/06/18 222 lb 6.4 oz (100.9 kg)  06/25/18 232 lb (105.2 kg)      Past Medical History:  Diagnosis Date  . Back pain    nerve problems-had series of three injections  . Chronic anticoagulation   . Diastolic congestive heart failure ([place])   . Dyspnea   . Hyperlipemia   . Migraine headache   . Paroxysmal atrial fibrillation ([facility])   . Restrictive cardiomyopathy ([place])   . Tubal pregnancy     Past Surgical History:  Procedure  Laterality Date  . BREAST EXCISIONAL BIOPSY Left    benign  . CARDIAC CATHETERIZATION    . CARDIOVERSION    . CHOLECYSTECTOMY    . ECTOPIC PREGNANCY SURGERY  1980  . EYE SURGERY     left eye injections x2-was having "stroke like" flashes of light  . heart biopsy  2008   [facility]  .  HYSTEROSCOPY  6/05   and D&C-simple hyperplasia secondary PMP bleeding  . ROTATOR CUFF REPAIR     bilateral     Current Outpatient Medications  Medication Sig Dispense Refill  . atorvastatin (LIPITOR) 20 MG tablet TAKE 1 TABLET BY MOUTH  DAILY AT 6PM 90 tablet 0  . dofetilide (TIKOSYN) 125 MCG capsule TAKE 3 CAPSULES BY MOUTH  TWICE DAILY 540 capsule 3  . ezetimibe (ZETIA) 10 MG tablet TAKE 1 TABLET BY MOUTH  DAILY 90 tablet 2  . fluticasone (FLONASE) 50 MCG/ACT nasal spray Place 1 spray daily into both nostrils.    . furosemide (LASIX) 40 MG tablet TAKE 1 TABLET BY MOUTH  TWICE DAILY 180 tablet 3  . gabapentin (NEURONTIN) 600 MG tablet Take 900 mg by mouth at bedtime.     . Multiple Vitamins-Minerals (ICAPS AREDS 2) CAPS Take 1 capsule by mouth 2 (two) times daily.    . potassium chloride SA (KLOR-CON) 20 MEQ tablet TAKE 1 TABLET BY MOUTH  TWICE DAILY 180 tablet 3  . propranolol (INDERAL) 10 MG tablet Take 1 tablet (10 mg total) by mouth 4 (four) times daily as needed (for atrial fibrillation). 30 tablet 6  . spironolactone (ALDACTONE) 25 MG tablet TAKE 1 TABLET BY MOUTH  DAILY 90 tablet 3  . triamcinolone cream (KENALOG) 0.5 % APPLY TO AFFECTED AREA 2 3 A DAY    . XARELTO 20 MG TABS tablet TAKE 1 TABLET BY MOUTH  DAILY WITH SUPPER 90 tablet 1   No current facility-administered medications for this visit.    Allergies:   Topamax [topiramate], Codeine, Oxycodone, and Penicillins    Social History:  The patient  reports that she has never smoked. She has never used smokeless tobacco. She reports previous alcohol use. She reports that she does not use drugs.   Family History:  [name] family  history includes Atrial fibrillation in her mother; Breast cancer in her sister; Cancer in her maternal grandmother and maternal uncle; Heart disease in her sister; Hypertension in her father; Thyroid cancer in her sister.    ROS:   As noted in current hx. Otherwise negative    Physical Exam: Blood pressure 118/74, pulse 66, height 5\' 9"  (1.753 m), weight 225 lb 12 oz (102.4 kg), last menstrual period 01/21/1990, SpO2 97 %.  GEN:  Middle age female,   HEENT: Normal NECK: No JVD; No carotid bruits LYMPHATICS: No lymphadenopathy CARDIAC: RRR  , soft systolic murmur  RESPIRATORY:  Clear to auscultation without rales, wheezing or rhonchi  ABDOMEN: Soft, non-tender, non-distended MUSCULOSKELETAL:  No edema; No deformity  SKIN: Warm and dry NEUROLOGIC:  Alert and oriented x 3   Recent Labs: 04/13/2019: BUN 19; Creatinine, Ser 0.99; Magnesium 2.5; Potassium 4.2; Sodium 142    Lipid Panel    Component Value Date/Time   CHOL 134 07/23/2017 0847   TRIG 153 (H) 07/23/2017 0847   HDL 40 07/23/2017 0847   CHOLHDL 3.4 07/23/2017 0847   CHOLHDL 4 10/22/2013 1501   VLDL 32.6 10/22/2013 1501   LDLCALC 63 07/23/2017 0847      Wt Readings from Last 3 Encounters:  04/13/19 225 lb 12 oz (102.4 kg)  10/06/18 222 lb 6.4 oz (100.9 kg)  06/25/18 232 lb (105.2 kg)      ECG : Sinus bradycardia at 59 beats minute.  Low voltage QRS.  QT C is 423 ms.   Other studies Reviewed: Additional studies/ records that were reviewed today include: . Review of  the above records demonstrates:    ASSESSMENT AND PLAN:  1. Atrial fibrillation -I  Is staying in sinus rhythm. Is on Tikosyn.   Check BMP and mag level today  Will ave her see an APP in 6 months with BMP and mag level   2. Hyperlipidemia-labs labs from Dr. Ferd Hibbs office in January, 2021 reveal a total cholesterol of 167, HDL is 46.  LDL is 95.  Triglyceride level is 132.  Hemoglobin A1c is 5.8.  Potassium is 4.3.  3.  Chest discomfort:  : no  CP recently   4. Diastolic congestive heart failure -   Is eating a bit more salt than she should .  .  5.  Mild TR :   Stable    Current medicines are reviewed at length with the patient today.  The patient does not have concerns regarding medicines.  The following changes have been made:  See above.   Labs/ tests ordered today include:   Orders Placed This Encounter  Procedures  . Magnesium  . Basic Metabolic Panel (BMET)    Disposition:   FU with me in 6  months for OV,    Kristeen Miss, MD  04/13/2019 6:40 PM    [facility] [address] [place], [place], [place]  [address] Phone: [phone]; Fax: [phone]

## 2019-04-13 ENCOUNTER — Other Ambulatory Visit: Payer: Self-pay

## 2019-04-13 ENCOUNTER — Ambulatory Visit: Payer: Medicare PPO | Admitting: Cardiovascular Disease

## 2019-04-13 ENCOUNTER — Encounter: Payer: Self-pay | Admitting: Cardiovascular Disease

## 2019-04-13 VITALS — BP 118/74 | HR 66 | Ht 69.0 in | Wt 225.8 lb

## 2019-04-13 DIAGNOSIS — I480 Paroxysmal atrial fibrillation: Secondary | ICD-10-CM

## 2019-04-13 DIAGNOSIS — H6590 Unspecified nonsuppurative otitis media, unspecified ear: Secondary | ICD-10-CM | POA: Diagnosis not present

## 2019-04-13 DIAGNOSIS — M79675 Pain in left toe(s): Secondary | ICD-10-CM | POA: Diagnosis not present

## 2019-04-13 DIAGNOSIS — Z79899 Other long term (current) drug therapy: Secondary | ICD-10-CM | POA: Diagnosis not present

## 2019-04-13 DIAGNOSIS — Z7901 Long term (current) use of anticoagulants: Secondary | ICD-10-CM | POA: Diagnosis not present

## 2019-04-13 DIAGNOSIS — I5032 Chronic diastolic (congestive) heart failure: Secondary | ICD-10-CM | POA: Diagnosis not present

## 2019-04-13 DIAGNOSIS — H9203 Otalgia, bilateral: Secondary | ICD-10-CM | POA: Diagnosis not present

## 2019-04-13 LAB — BASIC METABOLIC PANEL
BUN/Creatinine Ratio: 19 (ref 12–28)
BUN: 19 mg/dL (ref 8–27)
CO2: 24 mmol/L (ref 20–29)
Calcium: 9.6 mg/dL (ref 8.7–10.3)
Chloride: 101 mmol/L (ref 96–106)
Creatinine, Ser: 0.99 mg/dL (ref 0.57–1.00)
GFR calc Af Amer: 68 mL/min/{1.73_m2} (ref >59)
GFR calc non Af Amer: 59 mL/min/{1.73_m2} — ABNORMAL LOW (ref >59)
Glucose: 101 mg/dL — ABNORMAL HIGH (ref 65–99)
Potassium: 4.2 mmol/L (ref 3.5–5.2)
Sodium: 142 mmol/L (ref 134–144)

## 2019-04-13 LAB — MAGNESIUM: Magnesium: 2.5 mg/dL — ABNORMAL HIGH (ref 1.6–2.3)

## 2019-04-13 NOTE — Patient Instructions (Signed)
Medication Instructions:  Your physician recommends that you continue on your current medications as directed. Please refer to the Current Medication list given to you today.  *If you need a refill on your cardiac medications before your next appointment, please call your pharmacy*   Lab Work: TODAY - Magnesium, BMET If you have labs (blood work) drawn today and your tests are completely normal, you will receive your results only by: [name] MyChart Message (if you have MyChart) OR . A paper copy in the mail If you have any lab test that is abnormal or we need to change your treatment, we will call you to review the results.   Testing/Procedures: None Ordered   Follow-Up: At [facility], you and your health needs are our priority.  As part of our continuing mission to provide you with exceptional heart care, we have created designated Provider Care Teams.  These Care Teams include your primary Cardiologist (physician) and Advanced Practice Providers (APPs -  Physician Assistants and Nurse Practitioners) who all work together to provide you with the care you need, when you need it.  We recommend signing up for the patient portal called "MyChart".  Sign up information is provided on this After Visit Summary.  MyChart is used to connect with patients for Virtual Visits (Telemedicine).  Patients are able to view lab/test results, encounter notes, upcoming appointments, etc.  Non-urgent messages can be sent to your provider as well.   To learn more about what you can do with MyChart, go to [email or web address].    Your next appointment:   6 month(s)  The format for your next appointment:   Either In Person or Virtual  Provider:   Tereso Newcomer, PA-C or [employer], PA-C   Other Instructions:  Mediterranean Diet A Mediterranean diet refers to food and lifestyle choices that are based on the traditions of countries located on the [employer]. This way of eating has been shown to  help prevent certain conditions and improve outcomes for people who have chronic diseases, like kidney disease and heart disease. What are tips for following this plan? Lifestyle  Cook and eat meals together with your family, when possible.  Drink enough fluid to keep your urine clear or pale yellow.  Be physically active every day. This includes: ? Aerobic exercise like running or swimming. ? Leisure activities like gardening, walking, or housework.  Get 7-8 hours of sleep each night.  If recommended by your health care provider, drink red wine in moderation. This means 1 glass a day for nonpregnant women and 2 glasses a day for men. A glass of wine equals 5 oz (150 mL). Reading food labels   Check the serving size of packaged foods. For foods such as rice and pasta, the serving size refers to the amount of cooked product, not dry.  Check the total fat in packaged foods. Avoid foods that have saturated fat or trans fats.  Check the ingredients list for added sugars, such as corn syrup. Shopping  At the grocery store, buy most of your food from the areas near the walls of the store. This includes: ? Fresh fruits and vegetables (produce). ? Grains, beans, nuts, and seeds. Some of these may be available in unpackaged forms or large amounts (in bulk). ? Fresh seafood. ? Poultry and eggs. ? Low-fat dairy products.  Buy whole ingredients instead of prepackaged foods.  Buy fresh fruits and vegetables in-season from local farmers markets.  Buy frozen fruits and vegetables in resealable  bags.  If you do not have access to quality fresh seafood, buy precooked frozen shrimp or canned fish, such as tuna, salmon, or sardines.  Buy small amounts of raw or cooked vegetables, salads, or olives from the deli or salad bar at your store.  Stock your pantry so you always have certain foods on hand, such as olive oil, canned tuna, canned tomatoes, rice, pasta, and beans. Cooking  Cook foods  with extra-virgin olive oil instead of using butter or other vegetable oils.  Have meat as a side dish, and have vegetables or grains as your main dish. This means having meat in small portions or adding small amounts of meat to foods like pasta or stew.  Use beans or vegetables instead of meat in common dishes like chili or lasagna.  Experiment with different cooking methods. Try roasting or broiling vegetables instead of steaming or sauteing them.  Add frozen vegetables to soups, stews, pasta, or rice.  Add nuts or seeds for added healthy fat at each meal. You can add these to yogurt, salads, or vegetable dishes.  Marinate fish or vegetables using olive oil, lemon juice, garlic, and fresh herbs. Meal planning   Plan to eat 1 vegetarian meal one day each week. Try to work up to 2 vegetarian meals, if possible.  Eat seafood 2 or more times a week.  Have healthy snacks readily available, such as: ? Vegetable sticks with hummus. ? [place] yogurt. ? Fruit and nut trail mix.  Eat balanced meals throughout the week. This includes: ? Fruit: 2-3 servings a day ? Vegetables: 4-5 servings a day ? Low-fat dairy: 2 servings a day ? Fish, poultry, or lean meat: 1 serving a day ? Beans and legumes: 2 or more servings a week ? Nuts and seeds: 1-2 servings a day ? Whole grains: 6-8 servings a day ? Extra-virgin olive oil: 3-4 servings a day  Limit red meat and sweets to only a few servings a month What are my food choices?  Mediterranean diet ? Recommended  Grains: Whole-grain pasta. Brown rice. Bulgar wheat. Polenta. Couscous. Whole-wheat bread. Orpah Cobb.  Vegetables: Artichokes. Beets. Broccoli. Cabbage. Carrots. Eggplant. Green beans. Chard. Kale. Spinach. Onions. Leeks. Peas. Squash. Tomatoes. Peppers. Radishes.  Fruits: Apples. Apricots. Avocado. Berries. Bananas. Cherries. Dates. Figs. Grapes. Lemons. Melon. Oranges. Peaches. Plums. Pomegranate.  Meats and other protein  foods: Beans. Almonds. Sunflower seeds. Pine nuts. Peanuts. Cod. Salmon. Scallops. Shrimp. Tuna. Tilapia. Clams. Oysters. Eggs.  Dairy: Low-fat milk. Cheese. Greek yogurt.  Beverages: Water. Red wine. Herbal tea.  Fats and oils: Extra virgin olive oil. Avocado oil. Grape seed oil.  Sweets and desserts: [place] yogurt with honey. Baked apples. Poached pears. Trail mix.  Seasoning and other foods: Basil. Cilantro. Coriander. Cumin. Mint. Parsley. Sage. Rosemary. Tarragon. Garlic. Oregano. Thyme. Pepper. Balsalmic vinegar. Tahini. Hummus. Tomato sauce. Olives. Mushrooms. ? Limit these  Grains: Prepackaged pasta or rice dishes. Prepackaged cereal with added sugar.  Vegetables: Deep fried potatoes (french fries).  Fruits: Fruit canned in syrup.  Meats and other protein foods: Beef. Pork. Lamb. Poultry with skin. Hot dogs. [facility].  Dairy: Ice cream. Sour cream. Whole milk.  Beverages: Juice. Sugar-sweetened soft drinks. Beer. Liquor and spirits.  Fats and oils: Butter. Canola oil. Vegetable oil. Beef fat (tallow). Lard.  Sweets and desserts: Cookies. Cakes. Pies. Candy.  Seasoning and other foods: Mayonnaise. Premade sauces and marinades. The items listed may not be a complete list. Talk with your dietitian about what dietary choices are  right for you. Summary  The Mediterranean diet includes both food and lifestyle choices.  Eat a variety of fresh fruits and vegetables, beans, nuts, seeds, and whole grains.  Limit the amount of red meat and sweets that you eat.  Talk with your health care provider about whether it is safe for you to drink red wine in moderation. This means 1 glass a day for nonpregnant women and 2 glasses a day for men. A glass of wine equals 5 oz (150 mL). This information is not intended to replace advice given to you by your health care provider. Make sure you discuss any questions you have with your health care provider. Document Revised: 09/07/2015 Document  Reviewed: 08/31/2015 Elsevier Patient Education  [place].

## 2019-04-28 DIAGNOSIS — M5126 Other intervertebral disc displacement, lumbar region: Secondary | ICD-10-CM | POA: Diagnosis not present

## 2019-04-28 DIAGNOSIS — M4126 Other idiopathic scoliosis, lumbar region: Secondary | ICD-10-CM | POA: Diagnosis not present

## 2019-04-28 DIAGNOSIS — M5416 Radiculopathy, lumbar region: Secondary | ICD-10-CM | POA: Diagnosis not present

## 2019-04-28 DIAGNOSIS — M545 Low back pain: Secondary | ICD-10-CM | POA: Diagnosis not present

## 2019-04-30 ENCOUNTER — Other Ambulatory Visit: Payer: Self-pay | Admitting: Cardiovascular Disease

## 2019-04-30 DIAGNOSIS — M545 Low back pain: Secondary | ICD-10-CM | POA: Diagnosis not present

## 2019-04-30 DIAGNOSIS — M6281 Muscle weakness (generalized): Secondary | ICD-10-CM | POA: Diagnosis not present

## 2019-04-30 DIAGNOSIS — M5416 Radiculopathy, lumbar region: Secondary | ICD-10-CM | POA: Diagnosis not present

## 2019-05-04 DIAGNOSIS — M6281 Muscle weakness (generalized): Secondary | ICD-10-CM | POA: Diagnosis not present

## 2019-05-04 DIAGNOSIS — M545 Low back pain: Secondary | ICD-10-CM | POA: Diagnosis not present

## 2019-05-04 DIAGNOSIS — M5416 Radiculopathy, lumbar region: Secondary | ICD-10-CM | POA: Diagnosis not present

## 2019-05-07 DIAGNOSIS — M5416 Radiculopathy, lumbar region: Secondary | ICD-10-CM | POA: Diagnosis not present

## 2019-05-07 DIAGNOSIS — M6281 Muscle weakness (generalized): Secondary | ICD-10-CM | POA: Diagnosis not present

## 2019-05-07 DIAGNOSIS — M545 Low back pain: Secondary | ICD-10-CM | POA: Diagnosis not present

## 2019-05-11 DIAGNOSIS — M5416 Radiculopathy, lumbar region: Secondary | ICD-10-CM | POA: Diagnosis not present

## 2019-05-11 DIAGNOSIS — M545 Low back pain: Secondary | ICD-10-CM | POA: Diagnosis not present

## 2019-05-11 DIAGNOSIS — M6281 Muscle weakness (generalized): Secondary | ICD-10-CM | POA: Diagnosis not present

## 2019-05-14 DIAGNOSIS — M545 Low back pain: Secondary | ICD-10-CM | POA: Diagnosis not present

## 2019-05-14 DIAGNOSIS — M6281 Muscle weakness (generalized): Secondary | ICD-10-CM | POA: Diagnosis not present

## 2019-05-14 DIAGNOSIS — M5416 Radiculopathy, lumbar region: Secondary | ICD-10-CM | POA: Diagnosis not present

## 2019-05-18 DIAGNOSIS — M6281 Muscle weakness (generalized): Secondary | ICD-10-CM | POA: Diagnosis not present

## 2019-05-18 DIAGNOSIS — M545 Low back pain: Secondary | ICD-10-CM | POA: Diagnosis not present

## 2019-05-18 DIAGNOSIS — M5416 Radiculopathy, lumbar region: Secondary | ICD-10-CM | POA: Diagnosis not present

## 2019-05-21 DIAGNOSIS — M545 Low back pain: Secondary | ICD-10-CM | POA: Diagnosis not present

## 2019-05-21 DIAGNOSIS — M5416 Radiculopathy, lumbar region: Secondary | ICD-10-CM | POA: Diagnosis not present

## 2019-05-21 DIAGNOSIS — M6281 Muscle weakness (generalized): Secondary | ICD-10-CM | POA: Diagnosis not present

## 2019-05-28 DIAGNOSIS — M545 Low back pain: Secondary | ICD-10-CM | POA: Diagnosis not present

## 2019-05-28 DIAGNOSIS — M6281 Muscle weakness (generalized): Secondary | ICD-10-CM | POA: Diagnosis not present

## 2019-05-28 DIAGNOSIS — M5416 Radiculopathy, lumbar region: Secondary | ICD-10-CM | POA: Diagnosis not present

## 2019-06-01 DIAGNOSIS — M545 Low back pain: Secondary | ICD-10-CM | POA: Diagnosis not present

## 2019-06-01 DIAGNOSIS — M25462 Effusion, left knee: Secondary | ICD-10-CM | POA: Diagnosis not present

## 2019-06-03 ENCOUNTER — Other Ambulatory Visit (HOSPITAL_COMMUNITY): Payer: Self-pay | Admitting: Orthopedic Surgery

## 2019-06-03 DIAGNOSIS — M25562 Pain in left knee: Secondary | ICD-10-CM | POA: Diagnosis not present

## 2019-06-03 DIAGNOSIS — M7989 Other specified soft tissue disorders: Secondary | ICD-10-CM

## 2019-06-03 DIAGNOSIS — M79605 Pain in left leg: Secondary | ICD-10-CM

## 2019-06-04 ENCOUNTER — Ambulatory Visit (HOSPITAL_COMMUNITY)
Admission: RE | Admit: 2019-06-04 | Discharge: 2019-06-04 | Disposition: A | Discharge status: Home / Self Care | Payer: Medicare PPO | Source: Ambulatory Visit | Attending: Internal Medicine | Admitting: Internal Medicine

## 2019-06-04 ENCOUNTER — Other Ambulatory Visit: Payer: Self-pay

## 2019-06-04 DIAGNOSIS — M79605 Pain in left leg: Secondary | ICD-10-CM | POA: Insufficient documentation

## 2019-06-04 DIAGNOSIS — M7989 Other specified soft tissue disorders: Secondary | ICD-10-CM | POA: Diagnosis not present

## 2019-06-04 NOTE — Progress Notes (Signed)
Left lower extremity venous duplex completed. Refer to "CV Proc" under chart review to view preliminary results.  06/04/2019 9:39 AM Eula Fried., MHA, RVT, RDCS, RDMS

## 2019-06-09 DIAGNOSIS — M25562 Pain in left knee: Secondary | ICD-10-CM | POA: Diagnosis not present

## 2019-06-28 ENCOUNTER — Other Ambulatory Visit: Payer: Self-pay | Admitting: Orthopedic Surgery

## 2019-06-28 DIAGNOSIS — M25562 Pain in left knee: Secondary | ICD-10-CM

## 2019-06-29 ENCOUNTER — Ambulatory Visit
Admission: RE | Admit: 2019-06-29 | Discharge: 2019-06-29 | Disposition: A | Discharge status: Home / Self Care | Payer: Medicare PPO | Source: Ambulatory Visit | Attending: Orthopedic Surgery | Admitting: Orthopedic Surgery

## 2019-06-29 DIAGNOSIS — M25562 Pain in left knee: Secondary | ICD-10-CM

## 2019-06-29 DIAGNOSIS — M7122 Synovial cyst of popliteal space [Baker], left knee: Secondary | ICD-10-CM | POA: Diagnosis not present

## 2019-07-01 DIAGNOSIS — M5126 Other intervertebral disc displacement, lumbar region: Secondary | ICD-10-CM | POA: Diagnosis not present

## 2019-07-01 DIAGNOSIS — M5416 Radiculopathy, lumbar region: Secondary | ICD-10-CM | POA: Diagnosis not present

## 2019-07-01 DIAGNOSIS — M545 Low back pain: Secondary | ICD-10-CM | POA: Diagnosis not present

## 2019-07-01 DIAGNOSIS — M4126 Other idiopathic scoliosis, lumbar region: Secondary | ICD-10-CM | POA: Diagnosis not present

## 2019-07-06 DIAGNOSIS — R519 Headache, unspecified: Secondary | ICD-10-CM | POA: Diagnosis not present

## 2019-07-06 DIAGNOSIS — G43019 Migraine without aura, intractable, without status migrainosus: Secondary | ICD-10-CM | POA: Diagnosis not present

## 2019-07-06 DIAGNOSIS — G43719 Chronic migraine without aura, intractable, without status migrainosus: Secondary | ICD-10-CM | POA: Diagnosis not present

## 2019-07-21 ENCOUNTER — Other Ambulatory Visit: Payer: Self-pay | Admitting: Orthopedic Surgery

## 2019-07-21 DIAGNOSIS — M7122 Synovial cyst of popliteal space [Baker], left knee: Secondary | ICD-10-CM

## 2019-07-28 ENCOUNTER — Other Ambulatory Visit: Payer: Self-pay | Admitting: Orthopedic Surgery

## 2019-07-28 ENCOUNTER — Ambulatory Visit
Admission: RE | Admit: 2019-07-28 | Discharge: 2019-07-28 | Disposition: A | Discharge status: Home / Self Care | Payer: Medicare PPO | Source: Ambulatory Visit | Attending: Orthopedic Surgery | Admitting: Orthopedic Surgery

## 2019-07-28 DIAGNOSIS — M7122 Synovial cyst of popliteal space [Baker], left knee: Secondary | ICD-10-CM | POA: Diagnosis not present

## 2019-08-02 DIAGNOSIS — M545 Low back pain: Secondary | ICD-10-CM | POA: Diagnosis not present

## 2019-08-02 DIAGNOSIS — M5416 Radiculopathy, lumbar region: Secondary | ICD-10-CM | POA: Diagnosis not present

## 2019-08-02 DIAGNOSIS — M5126 Other intervertebral disc displacement, lumbar region: Secondary | ICD-10-CM | POA: Diagnosis not present

## 2019-08-03 DIAGNOSIS — M5416 Radiculopathy, lumbar region: Secondary | ICD-10-CM | POA: Diagnosis not present

## 2019-08-05 ENCOUNTER — Encounter (INDEPENDENT_AMBULATORY_CARE_PROVIDER_SITE_OTHER): Payer: Self-pay | Admitting: Otolaryngology

## 2019-08-05 ENCOUNTER — Ambulatory Visit (INDEPENDENT_AMBULATORY_CARE_PROVIDER_SITE_OTHER): Payer: Medicare PPO | Admitting: Otolaryngology

## 2019-08-05 ENCOUNTER — Other Ambulatory Visit: Payer: Self-pay

## 2019-08-05 VITALS — Temp 97.3°F

## 2019-08-05 DIAGNOSIS — H903 Sensorineural hearing loss, bilateral: Secondary | ICD-10-CM

## 2019-08-05 DIAGNOSIS — H6983 Other specified disorders of Eustachian tube, bilateral: Secondary | ICD-10-CM | POA: Diagnosis not present

## 2019-08-05 MED ORDER — PROPRANOLOL HCL 10 MG PO TABS: 10.0000 mg | ORAL_TABLET | Freq: Four times a day (QID) | ORAL | 2 refills | Status: DC | PRN

## 2019-08-05 MED ORDER — ATORVASTATIN CALCIUM 20 MG PO TABS: ORAL_TABLET | ORAL | 2 refills | Status: DC

## 2019-08-05 MED ORDER — RIVAROXABAN 20 MG PO TABS: ORAL_TABLET | ORAL | 2 refills | Status: DC

## 2019-08-05 NOTE — Telephone Encounter (Signed)
Pt last saw Dr Elease Hashimoto 04/13/19, last labs 04/13/19 Creat 0.99, age 69, weight 102.4kg, CrCl 86.7, based on CrCl pt is on appropriate dosage of Xarelto 20mg  QD.  Will refill rx.

## 2019-08-05 NOTE — Progress Notes (Signed)
HPI: [name] is a 69 y.o. female who presents is referred by PCP for evaluation of ear pain and pressure.  She states that the ears feel clogged and will not pop open.  She states that low tones are driving her crazy.  She does have history of allergies and uses Flonase and Xyzal.  She has used Flonase previously with relief of pressure but more recently has been using Flonase but still has pressure in her ears.  She has had hearing aids for several years..  Past Medical History:  Diagnosis Date  . Back pain    nerve problems-had series of three injections  . Chronic anticoagulation   . Diastolic congestive heart failure (HCC)   . Dyspnea   . Hyperlipemia   . Migraine headache   . Paroxysmal atrial fibrillation (HCC)   . Restrictive cardiomyopathy (HCC)   . Tubal pregnancy    Past Surgical History:  Procedure Laterality Date  . BREAST EXCISIONAL BIOPSY Left    benign  . CARDIAC CATHETERIZATION    . CARDIOVERSION    . CHOLECYSTECTOMY    . ECTOPIC PREGNANCY SURGERY  1980  . EYE SURGERY     left eye injections x2-was having "stroke like" flashes of light  . heart biopsy  2008   [facility]  . HYSTEROSCOPY  6/05   and D&C-simple hyperplasia secondary PMP bleeding  . ROTATOR CUFF REPAIR     bilateral   Social History   Socioeconomic History  . Marital status: Married    Spouse name: [name]  . Number of children: 0  . Years of education: college  . Highest education level: Not on file  Occupational History    Comment: retired  Tobacco Use  . Smoking status: Never Smoker  . Smokeless tobacco: Never Used  Vaping Use  . Vaping Use: Never used  Substance and Sexual Activity  . Alcohol use: Not Currently  . Drug use: No  . Sexual activity: Not Currently    Birth control/protection: Post-menopausal  Other Topics Concern  . Not on file  Social History Narrative   Patient lives at home with her husband [name])  and she is retired. College education.   Patient is retired.    Caffeine- 2-3 cups tea daily .   Social Determinants of Health   Financial Resource Strain:   . Difficulty of Paying Living Expenses:   Food Insecurity:   . Worried About Programme researcher, broadcasting/film/video in the Last Year:   . Barista in the Last Year:   Transportation Needs:   . Freight forwarder (Medical):   [name] Lack of Transportation (Non-Medical):   Physical Activity:   . Days of Exercise per Week:   . Minutes of Exercise per Session:   Stress:   . Feeling of Stress :   Social Connections:   . Frequency of Communication with Friends and Family:   . Frequency of Social Gatherings with Friends and Family:   . Attends Religious Services:   . Active Member of Clubs or Organizations:   . Attends Banker Meetings:   [name] Marital Status:    Family History  Problem Relation Age of Onset  . Hypertension Father   . Atrial fibrillation Mother   . Cancer Maternal Grandmother        mouth  . Cancer Maternal Uncle        unknown type  . Thyroid cancer Sister   . Breast cancer Sister  breast cancer  . Heart disease Sister    Allergies  Allergen Reactions  . Topamax [Topiramate] Other (See Comments)    hallucinations   . Codeine Nausea And Vomiting    VERTIGO  . Oxycodone     Hives   . Penicillins Rash   Prior to Admission medications   Medication Sig Start Date End Date Taking? Authorizing Provider  atorvastatin (LIPITOR) 20 MG tablet TAKE 1 TABLET BY MOUTH  DAILY AT 6PM 08/05/19  Yes Nahser, Deloris Ping, MD  dofetilide (TIKOSYN) 125 MCG capsule TAKE 3 CAPSULES BY MOUTH  TWICE DAILY 11/11/18  Yes Nahser, Deloris Ping, MD  ezetimibe (ZETIA) 10 MG tablet TAKE 1 TABLET BY MOUTH  DAILY 04/30/19  Yes Nahser, Deloris Ping, MD  fluticasone (FLONASE) 50 MCG/ACT nasal spray Place 1 spray daily into both nostrils.   Yes [provider]  furosemide (LASIX) 40 MG tablet TAKE 1 TABLET BY MOUTH  TWICE DAILY 11/10/18  Yes Nahser, Deloris Ping, MD  gabapentin (NEURONTIN) 600 MG  tablet Take 900 mg by mouth at bedtime.    Yes [provider]  Multiple Vitamins-Minerals (ICAPS AREDS 2) CAPS Take 1 capsule by mouth 2 (two) times daily.   Yes [provider]  potassium chloride SA (KLOR-CON) 20 MEQ tablet TAKE 1 TABLET BY MOUTH  TWICE DAILY 11/10/18  Yes Nahser, Deloris Ping, MD  propranolol (INDERAL) 10 MG tablet Take 1 tablet (10 mg total) by mouth 4 (four) times daily as needed (for atrial fibrillation). 08/05/19  Yes Nahser, Deloris Ping, MD  rivaroxaban (XARELTO) 20 MG TABS tablet TAKE 1 TABLET BY MOUTH  DAILY WITH SUPPER 08/05/19  Yes Nahser, Deloris Ping, MD  spironolactone (ALDACTONE) 25 MG tablet TAKE 1 TABLET BY MOUTH  DAILY 11/10/18  Yes Nahser, Deloris Ping, MD  triamcinolone cream (KENALOG) 0.5 % APPLY TO AFFECTED AREA 2 3 A DAY 03/16/18  Yes [provider]     Positive ROS: Otherwise negative  All other systems have been reviewed and were otherwise negative with the exception of those mentioned in the HPI and as above.  Physical Exam: Constitutional: Alert, well-appearing, no acute distress Ears: External ears without lesions or tenderness.  On microscopic exam ear canals are clear bilaterally.  TMs are clear bilaterally with good mobility on pneumatic otoscopy.  Hearing screening with a tuning forks revealed AC > BC bilaterally with no conductive loss. Nasal: External nose without lesions. Septum with minimal deformity and mild rhinitis.  Both middle meatus regions were clear with no signs of infection.[name]  Posterior nasal cavity is clear with minimal clear mucus. Oral: Lips and gums without lesions. Tongue and palate mucosa without lesions. Posterior oropharynx clear. Neck: No palpable adenopathy or masses Respiratory: Breathing comfortably  Skin: No facial/neck lesions or rash noted.  Procedures  Assessment: Bilateral sensorineural hearing loss.  Patient wears bilateral hearing aids. Ear pressure with questionable eustachian tube dysfunction.   Normal TM examination and middle ear space examination on otoscopic exam today.  Plan: Reassured patient of no signs of infection or middle ear effusions.  Would recommend continuation with the Flonase as this will help improve eustachian tube function along with allergy medication as needed. I would recommend repeating audiogram with her audiologist who has done previous hearing test to see if her hearing is diminishing.  She was not scheduled for audiogram with [place] today.   Narda Bonds, MD   CC:

## 2019-08-06 DIAGNOSIS — M545 Low back pain: Secondary | ICD-10-CM | POA: Diagnosis not present

## 2019-08-06 DIAGNOSIS — M5416 Radiculopathy, lumbar region: Secondary | ICD-10-CM | POA: Diagnosis not present

## 2019-08-06 DIAGNOSIS — M5126 Other intervertebral disc displacement, lumbar region: Secondary | ICD-10-CM | POA: Diagnosis not present

## 2019-08-09 DIAGNOSIS — M545 Low back pain: Secondary | ICD-10-CM | POA: Diagnosis not present

## 2019-08-09 DIAGNOSIS — M5126 Other intervertebral disc displacement, lumbar region: Secondary | ICD-10-CM | POA: Diagnosis not present

## 2019-08-09 DIAGNOSIS — M5416 Radiculopathy, lumbar region: Secondary | ICD-10-CM | POA: Diagnosis not present

## 2019-08-11 ENCOUNTER — Ambulatory Visit: Payer: Medicare PPO | Admitting: Pulmonary Disease

## 2019-08-11 ENCOUNTER — Telehealth: Payer: Self-pay | Admitting: Pulmonary Disease

## 2019-08-11 ENCOUNTER — Encounter: Payer: Self-pay | Admitting: Pulmonary Disease

## 2019-08-11 ENCOUNTER — Other Ambulatory Visit: Payer: Self-pay

## 2019-08-11 DIAGNOSIS — R0602 Shortness of breath: Secondary | ICD-10-CM | POA: Diagnosis not present

## 2019-08-11 DIAGNOSIS — H903 Sensorineural hearing loss, bilateral: Secondary | ICD-10-CM | POA: Diagnosis not present

## 2019-08-11 DIAGNOSIS — G4733 Obstructive sleep apnea (adult) (pediatric): Secondary | ICD-10-CM

## 2019-08-11 MED ORDER — ALBUTEROL SULFATE HFA 108 (90 BASE) MCG/ACT IN AERS: 1.0000 | INHALATION_SPRAY | Freq: Four times a day (QID) | RESPIRATORY_TRACT | Status: DC | PRN

## 2019-08-11 MED ORDER — ALBUTEROL SULFATE HFA 108 (90 BASE) MCG/ACT IN AERS
2.0000 | INHALATION_SPRAY | Freq: Four times a day (QID) | RESPIRATORY_TRACT | 12 refills | Status: DC | PRN
Start: 2019-08-11 — End: 2022-05-01

## 2019-08-11 NOTE — Assessment & Plan Note (Signed)
CPAP download was reviewed which shows good compliance on auto CPAP, average pressure of 10 cm, no residuals and minimal leak She is generally compliant and CPAP is helped improve her daytime somnolence and fatigue  Weight loss encouraged, compliance with goal of at least 4-6 hrs every night is the expectation. Advised against medications with sedative side effects Cautioned against driving when sleepy - understanding that sleepiness will vary on a day to day basis

## 2019-08-11 NOTE — Progress Notes (Signed)
   Subjective:    Patient ID: [name], female    DOB: 07-29-1950, 69 y.o.   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  PMH - atrial fibrillation controlled on Tikosyn. Shehasrestrictive cardiomyopathy with negative cardiac biopsy at [facility] in the past. Essential tremor following with Neuro (neg aldolase/ck/acetylcholine recept)  CPAP is working well, has adjusted to full facemask, no problems with pressure. Download was reviewed.  She continues to have some shortness of breath on daily activities and has to take a break.  Feels chest tightness especially on days with high humidity, for the last 2 weeks.  No wheezing  Significant tests/ events reviewed CPET 03/2016 no clear indication for cardiopulmonary limitation. At peak exercise and with flat O2 pulse pattern, patient is likely limited due to diastolic dysfunction as a result of her body habitus. Expect weight loss and regular exercise would greatly improve exercise intolerance.   PFTs 01/2016 showed ratio 72, FVC 74%, FEV1 69%, TLC 91% and DLCO 73% suggesting moderate intraparenchymal restriction sniff test>> nodiaphragmatic dysfunction   PSG 2008 at [facility] showed total sleep time 362 minutes and a low AHI PSG 02/2016 showed mild OSA with AHI 8/hour  Echo 1/2018showed grade 2 diastolic dysfunction with RVSP 35  CT cors 02/2018 low risk CT angiogram 09/2013 was negative for pulmonary emboli   Review of Systems Patient denies significant dyspnea,cough, hemoptysis,  chest pain, palpitations, pedal edema, orthopnea, paroxysmal nocturnal dyspnea, lightheadedness, nausea, vomiting, abdominal or  leg pains      Objective:   Physical Exam  Gen. Pleasant, obese, in no distress ENT - no lesions, no post nasal drip Neck: No JVD, no thyromegaly, no carotid bruits Lungs: no use of accessory muscles, no dullness to percussion,  decreased without rales or rhonchi  Cardiovascular: Rhythm regular, heart sounds  normal, no murmurs or gallops, no peripheral edema Musculoskeletal: No deformities, no cyanosis or clubbing , no tremors       Assessment & Plan:

## 2019-08-11 NOTE — Patient Instructions (Signed)
°  CPAP download will be reviewed.  Prescription for albuterol MDI 1 puff every 6 hours only as needed for chest tightness or shortness of breath

## 2019-08-11 NOTE — Assessment & Plan Note (Signed)
Previous evaluation has not shown any cardiac or pulmonary disease, PFTs have not revealed airway obstruction but with her symptoms of chest tightness especially with high humidity days, will undertake trial of bronchodilator. Clearly concern here is for side effects, explained tremors and palpitations  Prescription for albuterol MDI 1 puff every 6 hours only as needed for chest tightness or shortness of breath

## 2019-08-11 NOTE — Telephone Encounter (Signed)
Refill sent in and patient husbands advised nothing further is needed.

## 2019-08-11 NOTE — Addendum Note (Signed)
Addended by: Luna Kitchens D on: 08/11/2019 10:52 AM   Modules accepted: Orders

## 2019-08-12 DIAGNOSIS — M5416 Radiculopathy, lumbar region: Secondary | ICD-10-CM | POA: Diagnosis not present

## 2019-08-12 DIAGNOSIS — M5126 Other intervertebral disc displacement, lumbar region: Secondary | ICD-10-CM | POA: Diagnosis not present

## 2019-08-12 DIAGNOSIS — M545 Low back pain: Secondary | ICD-10-CM | POA: Diagnosis not present

## 2019-08-16 DIAGNOSIS — M5416 Radiculopathy, lumbar region: Secondary | ICD-10-CM | POA: Diagnosis not present

## 2019-08-16 DIAGNOSIS — M5126 Other intervertebral disc displacement, lumbar region: Secondary | ICD-10-CM | POA: Diagnosis not present

## 2019-08-16 DIAGNOSIS — M545 Low back pain: Secondary | ICD-10-CM | POA: Diagnosis not present

## 2019-08-17 DIAGNOSIS — H40023 Open angle with borderline findings, high risk, bilateral: Secondary | ICD-10-CM | POA: Diagnosis not present

## 2019-08-17 DIAGNOSIS — H25013 Cortical age-related cataract, bilateral: Secondary | ICD-10-CM | POA: Diagnosis not present

## 2019-08-17 DIAGNOSIS — H35371 Puckering of macula, right eye: Secondary | ICD-10-CM | POA: Diagnosis not present

## 2019-08-17 DIAGNOSIS — H524 Presbyopia: Secondary | ICD-10-CM | POA: Diagnosis not present

## 2019-08-19 DIAGNOSIS — M47816 Spondylosis without myelopathy or radiculopathy, lumbar region: Secondary | ICD-10-CM | POA: Diagnosis not present

## 2019-08-19 DIAGNOSIS — M5416 Radiculopathy, lumbar region: Secondary | ICD-10-CM | POA: Diagnosis not present

## 2019-08-19 DIAGNOSIS — M545 Low back pain: Secondary | ICD-10-CM | POA: Diagnosis not present

## 2019-08-19 DIAGNOSIS — M5126 Other intervertebral disc displacement, lumbar region: Secondary | ICD-10-CM | POA: Diagnosis not present

## 2019-08-23 DIAGNOSIS — M545 Low back pain: Secondary | ICD-10-CM | POA: Diagnosis not present

## 2019-08-23 DIAGNOSIS — M5416 Radiculopathy, lumbar region: Secondary | ICD-10-CM | POA: Diagnosis not present

## 2019-08-23 DIAGNOSIS — M5126 Other intervertebral disc displacement, lumbar region: Secondary | ICD-10-CM | POA: Diagnosis not present

## 2019-08-26 DIAGNOSIS — M5126 Other intervertebral disc displacement, lumbar region: Secondary | ICD-10-CM | POA: Diagnosis not present

## 2019-08-26 DIAGNOSIS — M5416 Radiculopathy, lumbar region: Secondary | ICD-10-CM | POA: Diagnosis not present

## 2019-08-26 DIAGNOSIS — M545 Low back pain: Secondary | ICD-10-CM | POA: Diagnosis not present

## 2019-09-06 DIAGNOSIS — H35453 Secondary pigmentary degeneration, bilateral: Secondary | ICD-10-CM | POA: Diagnosis not present

## 2019-09-06 DIAGNOSIS — H43813 Vitreous degeneration, bilateral: Secondary | ICD-10-CM | POA: Diagnosis not present

## 2019-09-06 DIAGNOSIS — H35373 Puckering of macula, bilateral: Secondary | ICD-10-CM | POA: Diagnosis not present

## 2019-09-06 DIAGNOSIS — H35363 Drusen (degenerative) of macula, bilateral: Secondary | ICD-10-CM | POA: Diagnosis not present

## 2019-09-06 DIAGNOSIS — H353131 Nonexudative age-related macular degeneration, bilateral, early dry stage: Secondary | ICD-10-CM | POA: Diagnosis not present

## 2019-09-06 DIAGNOSIS — H40003 Preglaucoma, unspecified, bilateral: Secondary | ICD-10-CM | POA: Diagnosis not present

## 2019-10-07 DIAGNOSIS — M47816 Spondylosis without myelopathy or radiculopathy, lumbar region: Secondary | ICD-10-CM | POA: Diagnosis not present

## 2019-10-13 ENCOUNTER — Ambulatory Visit: Payer: Medicare PPO | Admitting: Physician Assistant

## 2019-10-23 DIAGNOSIS — Z23 Encounter for immunization: Secondary | ICD-10-CM | POA: Diagnosis not present

## 2019-11-09 DIAGNOSIS — M47816 Spondylosis without myelopathy or radiculopathy, lumbar region: Secondary | ICD-10-CM | POA: Diagnosis not present

## 2019-12-06 ENCOUNTER — Encounter: Payer: Self-pay | Admitting: Cardiovascular Disease

## 2019-12-06 NOTE — Progress Notes (Addendum)
could go okay  Cardiology Office Note   Date:  12/07/2019   ID:  [name], DOB 1950-09-30, MRN [id number]  PCP:  Creola Corn, MD  Cardiologist:   Kristeen Miss, MD he    Chief Complaint  Patient presents with  . Atrial Fibrillation  . Congestive Heart Failure   1. Atrial fibrillation 2. Hyperlipidemia 3. Diastolic congestive heart failure  69 year old female with a history of atrial fibrillation. She's been well controlled on Tikosyn. She also has a history of hyperlipidemia and is on Lipitor. She's been on chronic Pradaxa therapy for anticoagulation.  February 20, 2012 [name] has done well since I last saw her. 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 She has occasional palpitations but overall she's doing quite well. She is exercising ( water aerobics) twice a week.  Feb. 24, 2015:  [name] is doing well. No problems. Maintaining NSR.  Sept. 16, 2015:  She was in the ER this past week. CP, dyspnea .. CT angio was negative for PE - showed ? Of pneumonia. Was treated wit Abx. Does well in the am's .[name] and perhaps more short of breath in the afternoons.   April 05, 2014: [name] is a 69 y.o. female who presents for follow-up of her atrial fibrillation. She has continued to have some DOE,  Is concerned about the atorvastatin. Was seen in the lipid clinic.    Sept. 30, 2016:  Doing well.  No CP , no dyspnea. Has lost 11 lbs since last year.  Is in NSR  April 10, 2015:  [name] is doing ok from a cardiology standpoint. Has had some dental issues   Sept.  22, 2017:  [name] is seen for follow up of her atrial fib.    No  CP or dyspnea. No further episodes of atrial fib.   Dec. 11, 2017:  [name] is seen today for followup for her atrial fib  Has some DOE  Not exercising as much as she needs to  No CP   Jan. 16, 2018: [name] is seen today as a work in visit at the request of Dr. Timothy Lasso. Been having  more shortness of breath and was thought to have worsening congestive heart failure.  Has severe DOE with any exertion. We performed a cardiac cath in 2008 which revealed normal coronaries.   She went to [facility] and had a heart biopsy.   Was not told of any abnormality.  Does have dull chest pain with exertion and with sitting   She had an echocardiogram performed yesterday which revealed normal left ventricular systolic function. She does have grade 2 diastolic dysfunction. Her Lasix was increased last week.   She has not noticed any increase in urine output.  No change in her breathing .  Has noticed that she loses her breath if she does any exertion .  Cannot lay supine - has severe dyspnea.   April 02, 2016:  [name] is seen today for follow up visit .    Seen with husband , [name] .  She had an episode of PAF yesterday  - lasted for several hours.   Is back in NSR currently .  Had an episode in Feb. That lasted 2-3 hours , HR of 129 ,  She was very short of breath with that.   -Sleep study on Feb. 20 shows mild OSA -PFTs 01/2016 showed ratio 72, FVC 74%, FEV1 69%, TLC 91% and DLCO 73% suggesting moderate intraparenchymal  restriction  - echo shows normal LV systolic function with grade 2 diastolic dysfunction. She has an estimated PA pressure of 35.  Has developed a tremor.   Is seeing a neurologist .   July 10, 2016:  Had a CPX in March , Normal functional capacity compared to sedentary patients.  Still has some vocal issues ( when she gets fatigued, she loses her voice) struggles to talk  Able to get out and work in the garden Using CPAP - may have helped some   Dec. 19, 2018:  [name] is seen today for follow up of her chronic diastolic CHF [name] was [facility] earlier this year.  She had a heart catheterization which revealed normal coronary arteries.  She has had a myocardial biopsy which was reportedly normal. History of paroxysmal atrial  fibrillation which seems to be well-controlled on  Tikosyn  PFTs has shown reduction of DLCO ( 73% pred.)   Is just getting over al cold  Has continued to have issues with vertigo  Not getting out to do any exercise   July 23, 2017:  Doing well,  No CP or dyspnea. Gets short of breath with the heat.  Started getting some exercise, yoga .  No cardiac exercise  Is having some joint issues,     February 02, 2018: He is seen today for follow-up visit.  She has a history of atrial fibrillation. She remains in normal sinus rhythm on Tikosyn.   She had an episode of paroxysmal atrial fibrillation this past Thursday.  The episode lasted for about 3 hours.   She took 1 of [name]'s propranolol tablets and went back into sinus rhythm fairly quickly.  Her QT intervals have remained good.  Sept. 15, 2020   [name] is seen today . caregiving for [name]'s sister.  Wt. Today is  222 ( previous wt from Jan. 2020 was 241 lbs)   Walking some , eating fewer carbs.   April 13, 2019  [name] is seen today for follow up of her atrial fib and chronic diastilic CHF Wt is 225 lbs.  ( up 3 lbs )  Has chronic diastolic CHF Had a spinal injection last week..   Still has back and leg pain with walking  Has not been able to exercise.     Nov. 16, 2021 [name] is seen today for follow up of her chronic CHF, PAF  Wt today is 225 lbs.   Had an episode of PAF several weeks ago.   Lasted 2 days HR was 130s  Made her fatigued.  Has been walking ,  Had lost some weight, now has gained it back  Her Afib started while she was walking up her driveway  Is on xarelto   Wt Readings from Last 3 Encounters:  12/07/19 224 lb 12.8 oz (102 kg)  08/11/19 223 lb 12.8 oz (101.5 kg)  04/13/19 225 lb 12 oz (102.4 kg)      Past Medical History:  Diagnosis Date  . Back pain    nerve problems-had series of three injections  . Chronic anticoagulation   . Diastolic congestive heart failure (HCC)   . Dyspnea   .  Hyperlipemia   . Migraine headache   . Paroxysmal atrial fibrillation (HCC)   . Restrictive cardiomyopathy (HCC)   . Tubal pregnancy     Past Surgical History:  Procedure Laterality Date  . BREAST EXCISIONAL BIOPSY Left    benign  . CARDIAC CATHETERIZATION    .  CARDIOVERSION    . CHOLECYSTECTOMY    . ECTOPIC PREGNANCY SURGERY  1980  . EYE SURGERY     left eye injections x2-was having "stroke like" flashes of light  . heart biopsy  2008   [facility]  . HYSTEROSCOPY  6/05   and D&C-simple hyperplasia secondary PMP bleeding  . ROTATOR CUFF REPAIR     bilateral     Current Outpatient Medications  Medication Sig Dispense Refill  . albuterol (VENTOLIN HFA) 108 (90 Base) MCG/ACT inhaler Inhale 2 puffs into the lungs every 6 (six) hours as needed for wheezing or shortness of breath. 18 g 12  . atorvastatin (LIPITOR) 20 MG tablet TAKE 1 TABLET BY MOUTH  DAILY AT 6PM 90 tablet 2  . dofetilide (TIKOSYN) 125 MCG capsule Take 3 capsules (375 mcg total) by mouth 2 (two) times daily. 540 capsule 3  . ezetimibe (ZETIA) 10 MG tablet TAKE 1 TABLET BY MOUTH  DAILY 90 tablet 3  . fluticasone (FLONASE) 50 MCG/ACT nasal spray Place 1 spray daily into both nostrils.    . furosemide (LASIX) 40 MG tablet Take 1 tablet (40 mg total) by mouth 2 (two) times daily. 180 tablet 3  . gabapentin (NEURONTIN) 600 MG tablet Take 900 mg by mouth at bedtime.     . Multiple Vitamins-Minerals (ICAPS AREDS 2) CAPS Take 1 capsule by mouth 2 (two) times daily.    . potassium chloride SA (KLOR-CON) 20 MEQ tablet Take 1 tablet (20 mEq total) by mouth 2 (two) times daily. 180 tablet 3  . propranolol (INDERAL) 10 MG tablet Take 1 tablet (10 mg total) by mouth 4 (four) times daily as needed (for atrial fibrillation). 90 tablet 2  . rivaroxaban (XARELTO) 20 MG TABS tablet TAKE 1 TABLET BY MOUTH  DAILY WITH SUPPER 90 tablet 2  . spironolactone (ALDACTONE) 25 MG tablet Take 1 tablet (25 mg total) by mouth daily. 90 tablet 3  .  triamcinolone cream (KENALOG) 0.5 % APPLY TO AFFECTED AREA 2 3 A DAY     No current facility-administered medications for this visit.    Allergies:   Topamax [topiramate], Codeine, Oxycodone, and Penicillins    Social History:  The patient  reports that she has never smoked. She has never used smokeless tobacco. She reports previous alcohol use. She reports that she does not use drugs.   Family History:  [name] family history includes Atrial fibrillation in her mother; Breast cancer in her sister; Cancer in her maternal grandmother and maternal uncle; Heart disease in her sister; Hypertension in her father; Thyroid cancer in her sister.    ROS:   As noted in current hx. Otherwise negative    Physical Exam: Blood pressure 114/78, pulse 62, height 5\' 9"  (1.753 m), weight 224 lb 12.8 oz (102 kg), last menstrual period 01/21/1990, SpO2 98 %.  GEN:  Well nourished, well developed in no acute distress HEENT: Normal NECK: No JVD; No carotid bruits LYMPHATICS: No lymphadenopathy CARDIAC: RRR , no murmurs, rubs, gallops RESPIRATORY:  Clear to auscultation without rales, wheezing or rhonchi  ABDOMEN: Soft, non-tender, non-distended MUSCULOSKELETAL:  No edema; No deformity  SKIN: Warm and dry NEUROLOGIC:  Alert and oriented x 3    Recent Labs: 04/13/2019: BUN 19; Creatinine, Ser 0.99; Magnesium 2.5; Potassium 4.2; Sodium 142    Lipid Panel    Component Value Date/Time   CHOL 134 07/23/2017 0847   TRIG 153 (H) 07/23/2017 0847   HDL 40 07/23/2017 0847   CHOLHDL  3.4 07/23/2017 0847   CHOLHDL 4 10/22/2013 1501   VLDL 32.6 10/22/2013 1501   LDLCALC 63 07/23/2017 0847      Wt Readings from Last 3 Encounters:  12/07/19 224 lb 12.8 oz (102 kg)  08/11/19 223 lb 12.8 oz (101.5 kg)  04/13/19 225 lb 12 oz (102.4 kg)      ECG : December 07, 2019: Normal sinus rhythm at 62.  No ST or T wave abnormalities.   Other studies Reviewed: Additional studies/ records that were  reviewed today include: . Review of the above records demonstrates:    ASSESSMENT AND PLAN:  1. Atrial fibrillation -she has had several episodes of recurrent atrial fibrillation.  This last one lasted for 2 days.  Her heart rate was in the 130s.  She took 4 propranolol on 1 day but then did not take any for the next day.  I have reminded her that she can take propranolol every day and actually can take more than 4 if she needs.  If the propranolol seems to not be working adequately we could consider changing her to diltiazem 30 mg tablets.  We discussed sending her to electrophysiology for the consideration of ablation.  She wants to see how she does.  Continue Tikosyn for now.  We will check a basic metabolic profile and magnesium today.  We will also check a TSH since she seems to have some continued fatigue and she has a fine tremor today.    2. Hyperlipidemia-managed by Dr. Timothy Lasso.  Continue atorvastatin.   3.  Chest discomfort:  : She is not had any further episodes of chest pain.  4. Diastolic congestive heart failure -stable. .  5.  Mild TR :   Stable    Current medicines are reviewed at length with the patient today.  The patient does not have concerns regarding medicines.  The following changes have been made:  See above.   Labs/ tests ordered today include:   Orders Placed This Encounter  Procedures  . Basic Metabolic Panel (BMET)  . Magnesium  . TSH  . EKG 12-Lead    Disposition:   FU with me in 6  months for OV,    Kristeen Miss, MD  12/07/2019 8:31 AM    [facility] [address] [place], [place], [place]  [address] Phone: [phone]; Fax: [phone]

## 2019-12-07 ENCOUNTER — Ambulatory Visit: Payer: Medicare PPO | Admitting: Cardiovascular Disease

## 2019-12-07 ENCOUNTER — Encounter: Payer: Self-pay | Admitting: Cardiovascular Disease

## 2019-12-07 ENCOUNTER — Other Ambulatory Visit: Payer: Self-pay

## 2019-12-07 VITALS — BP 114/78 | HR 62 | Ht 69.0 in | Wt 224.8 lb

## 2019-12-07 DIAGNOSIS — I5032 Chronic diastolic (congestive) heart failure: Secondary | ICD-10-CM

## 2019-12-07 DIAGNOSIS — I480 Paroxysmal atrial fibrillation: Secondary | ICD-10-CM

## 2019-12-07 LAB — BASIC METABOLIC PANEL
BUN/Creatinine Ratio: 18 (ref 12–28)
BUN: 16 mg/dL (ref 8–27)
CO2: 29 mmol/L (ref 20–29)
Calcium: 9.5 mg/dL (ref 8.7–10.3)
Chloride: 102 mmol/L (ref 96–106)
Creatinine, Ser: 0.89 mg/dL (ref 0.57–1.00)
GFR calc Af Amer: 76 mL/min/{1.73_m2} (ref >59)
GFR calc non Af Amer: 66 mL/min/{1.73_m2} (ref >59)
Glucose: 103 mg/dL — ABNORMAL HIGH (ref 65–99)
Potassium: 4.3 mmol/L (ref 3.5–5.2)
Sodium: 143 mmol/L (ref 134–144)

## 2019-12-07 LAB — TSH: TSH: 1.39 u[IU]/mL (ref 0.450–4.500)

## 2019-12-07 LAB — MAGNESIUM: Magnesium: 2.3 mg/dL (ref 1.6–2.3)

## 2019-12-07 MED ORDER — SPIRONOLACTONE 25 MG PO TABS: 25.0000 mg | ORAL_TABLET | Freq: Every day | ORAL | 3 refills | Status: DC

## 2019-12-07 MED ORDER — POTASSIUM CHLORIDE CRYS ER 20 MEQ PO TBCR: 20.0000 meq | EXTENDED_RELEASE_TABLET | Freq: Two times a day (BID) | ORAL | 3 refills | Status: DC

## 2019-12-07 MED ORDER — DOFETILIDE 125 MCG PO CAPS: 375.0000 ug | ORAL_CAPSULE | Freq: Two times a day (BID) | ORAL | 3 refills | Status: DC

## 2019-12-07 MED ORDER — FUROSEMIDE 40 MG PO TABS: 40.0000 mg | ORAL_TABLET | Freq: Two times a day (BID) | ORAL | 3 refills | Status: DC

## 2019-12-07 NOTE — Patient Instructions (Signed)
Medication Instructions:  Your physician recommends that you continue on your current medications as directed. Please refer to the Current Medication list given to you today.  *If you need a refill on your cardiac medications before your next appointment, please call your pharmacy*   Lab Work: Bmet, Gates, TSH today  If you have labs (blood work) drawn today and your tests are completely normal, you will receive your results only by: [name] MyChart Message (if you have MyChart) OR . A paper copy in the mail If you have any lab test that is abnormal or we need to change your treatment, we will call you to review the results.   Testing/Procedures: none   Follow-Up: At [facility], you and your health needs are our priority.  As part of our continuing mission to provide you with exceptional heart care, we have created designated Provider Care Teams.  These Care Teams include your primary Cardiologist (physician) and Advanced Practice Providers (APPs -  Physician Assistants and Nurse Practitioners) who all work together to provide you with the care you need, when you need it.  We recommend signing up for the patient portal called "MyChart".  Sign up information is provided on this After Visit Summary.  MyChart is used to connect with patients for Virtual Visits (Telemedicine).  Patients are able to view lab/test results, encounter notes, upcoming appointments, etc.  Non-urgent messages can be sent to your provider as well.   To learn more about what you can do with MyChart, go to [email or web address].    Your next appointment:   6 month(s)  The format for your next appointment:   In Person  Provider:   Kristeen Miss, MD   Other Instructions

## 2019-12-20 DIAGNOSIS — M47816 Spondylosis without myelopathy or radiculopathy, lumbar region: Secondary | ICD-10-CM | POA: Diagnosis not present

## 2019-12-20 DIAGNOSIS — I480 Paroxysmal atrial fibrillation: Secondary | ICD-10-CM

## 2019-12-24 ENCOUNTER — Telehealth: Payer: Self-pay | Admitting: Cardiovascular Disease

## 2019-12-24 NOTE — Telephone Encounter (Signed)
New Message:     Pt said she would to change her pharmacy for all prescriptions unless it is new to [employer]

## 2019-12-27 ENCOUNTER — Ambulatory Visit: Payer: Medicare Other

## 2020-01-03 ENCOUNTER — Ambulatory Visit: Payer: Medicare PPO | Admitting: Internal Medicine

## 2020-01-03 ENCOUNTER — Other Ambulatory Visit: Payer: Self-pay

## 2020-01-03 ENCOUNTER — Encounter: Payer: Self-pay | Admitting: Internal Medicine

## 2020-01-03 VITALS — BP 136/80 | HR 51 | Ht 69.0 in | Wt 226.2 lb

## 2020-01-03 DIAGNOSIS — I480 Paroxysmal atrial fibrillation: Secondary | ICD-10-CM | POA: Diagnosis not present

## 2020-01-03 DIAGNOSIS — I5032 Chronic diastolic (congestive) heart failure: Secondary | ICD-10-CM

## 2020-01-03 DIAGNOSIS — D6869 Other thrombophilia: Secondary | ICD-10-CM

## 2020-01-03 NOTE — Patient Instructions (Signed)
Medication Instructions:  Your physician recommends that you continue on your current medications as directed. Please refer to the Current Medication list given to you today.  *If you need a refill on your cardiac medications before your next appointment, please call your pharmacy*  Lab Work: None ordered.  If you have labs (blood work) drawn today and your tests are completely normal, you will receive your results only by: [name] MyChart Message (if you have MyChart) OR . A paper copy in the mail If you have any lab test that is abnormal or we need to change your treatment, we will call you to review the results.  Testing/Procedures: ECHO Your physician has requested that you have an echocardiogram. Echocardiography is a painless test that uses sound waves to create images of your heart. It provides your doctor with information about the size and shape of your heart and how well your heart's chambers and valves are working. This procedure takes approximately one hour. There are no restrictions for this procedure.   Follow-Up: At [facility], you and your health needs are our priority.  As part of our continuing mission to provide you with exceptional heart care, we have created designated Provider Care Teams.  These Care Teams include your primary Cardiologist (physician) and Advanced Practice Providers (APPs -  Physician Assistants and Nurse Practitioners) who all work together to provide you with the care you need, when you need it.  We recommend signing up for the patient portal called "MyChart".  Sign up information is provided on this After Visit Summary.  MyChart is used to connect with patients for Virtual Visits (Telemedicine).  Patients are able to view lab/test results, encounter notes, upcoming appointments, etc.  Non-urgent messages can be sent to your provider as well.   To learn more about what you can do with MyChart, go to [email or web address].    Your next appointment:    Your physician wants you to follow-up in: 3 months with the Afib Clinic. They will contact you.       Other Instructions:

## 2020-01-03 NOTE — Progress Notes (Signed)
Electrophysiology Office Note   Date:  01/03/2020   ID:  [name], DOB 05-Mar-1950, MRN [id number]  PCP:  Creola Corn, MD  Cardiologist:  Nahser Primary Electrophysiologist: [name], MD    CC: afib   History of Present Illness: [name] is a 69 y.o. female who presents today for electrophysiology evaluation.   She reports being diagnosed with afib about 20 years ago.  She has been treated with tikosyn for at least 8 years.  She reports doing well with tikosyn initially.  She has however had increasing frequency and duration of her afib over the past year. This past year, she has had 3 episodes of afib, the longest lasting 3 days. During afib, she reports symptoms of palpitations, SOB, and fatigue. She is unaware of triggers/ precipitants for her afib.  Today, she denies symptoms of palpitations, chest pain, shortness of breath, orthopnea, PND, lower extremity edema, claudication, dizziness, presyncope, syncope, bleeding, or neurologic sequela. The patient is tolerating medications without difficulties and is otherwise without complaint today.    Past Medical History:  Diagnosis Date  . Back pain    nerve problems-had series of three injections  . Chronic anticoagulation   . Diastolic congestive heart failure (HCC)   . Hearing loss   . Hyperlipemia   . Migraine headache   . Obstructive sleep apnea    uses CPAP  . Paroxysmal atrial fibrillation (HCC)   . Restrictive cardiomyopathy (HCC)   . Tremor   . Tubal pregnancy    Past Surgical History:  Procedure Laterality Date  . BREAST EXCISIONAL BIOPSY Left    benign  . CARDIAC CATHETERIZATION    . CARDIOVERSION    . CHOLECYSTECTOMY    . ECTOPIC PREGNANCY SURGERY  1980  . EYE SURGERY     left eye injections x2-was having "stroke like" flashes of light  . heart biopsy  2008   [facility]  . HYSTEROSCOPY  6/05   and D&C-simple hyperplasia secondary PMP bleeding  . ROTATOR CUFF REPAIR     bilateral      Current Outpatient Medications  Medication Sig Dispense Refill  . albuterol (VENTOLIN HFA) 108 (90 Base) MCG/ACT inhaler Inhale 2 puffs into the lungs every 6 (six) hours as needed for wheezing or shortness of breath. 18 g 12  . atorvastatin (LIPITOR) 20 MG tablet TAKE 1 TABLET BY MOUTH  DAILY AT 6PM 90 tablet 2  . dofetilide (TIKOSYN) 125 MCG capsule Take 3 capsules (375 mcg total) by mouth 2 (two) times daily. 540 capsule 3  . ezetimibe (ZETIA) 10 MG tablet TAKE 1 TABLET BY MOUTH  DAILY 90 tablet 3  . fluticasone (FLONASE) 50 MCG/ACT nasal spray Place 1 spray daily into both nostrils.    . furosemide (LASIX) 40 MG tablet Take 1 tablet (40 mg total) by mouth 2 (two) times daily. 180 tablet 3  . gabapentin (NEURONTIN) 600 MG tablet Take 900 mg by mouth at bedtime.    . Multiple Vitamins-Minerals (ICAPS AREDS 2) CAPS Take 1 capsule by mouth 2 (two) times daily.    . potassium chloride SA (KLOR-CON) 20 MEQ tablet Take 1 tablet (20 mEq total) by mouth 2 (two) times daily. 180 tablet 3  . propranolol (INDERAL) 10 MG tablet Take 1 tablet (10 mg total) by mouth 4 (four) times daily as needed (for atrial fibrillation). 90 tablet 2  . rivaroxaban (XARELTO) 20 MG TABS tablet TAKE 1 TABLET BY MOUTH  DAILY WITH SUPPER 90 tablet 2  .  spironolactone (ALDACTONE) 25 MG tablet Take 1 tablet (25 mg total) by mouth daily. 90 tablet 3  . triamcinolone cream (KENALOG) 0.5 % APPLY TO AFFECTED AREA 2 3 A DAY     No current facility-administered medications for this visit.    Allergies:   Topamax [topiramate], Codeine, Oxycodone, and Penicillins   Social History:  The patient  reports that she has never smoked. She has never used smokeless tobacco. She reports previous alcohol use. She reports that she does not use drugs.   Family History:  [name]  family history includes Atrial fibrillation in her mother; Breast cancer in her sister; Cancer in her maternal grandmother and maternal uncle; Heart  disease in her sister; Hypertension in her father; Thyroid cancer in her sister.    ROS:  Please see the history of present illness.   All other systems are personally reviewed and negative.    PHYSICAL EXAM: VS:  BP 136/80   Pulse (!) 51   Ht 5\' 9"  (1.753 m)   Wt 226 lb 3.2 oz (102.6 kg)   LMP 01/21/1990   SpO2 96%   BMI 33.40 kg/m  , BMI Body mass index is 33.4 kg/m. GEN: Well nourished, well developed, in no acute distress HEENT: normal Neck: no JVD, carotid bruits, or masses Cardiac: RRR; no murmurs, rubs, or gallops,no edema  Respiratory:  clear to auscultation bilaterally, normal work of breathing GI: soft, nontender, nondistended, + BS MS: no deformity or atrophy Skin: warm and dry  Neuro:  Strength and sensation are intact Psych: euthymic mood, full affect  EKG:  EKG is ordered today. The ekg ordered today is personally reviewed and shows sinus bradycardia   Recent Labs: 12/07/2019: BUN 16; Creatinine, Ser 0.89; Magnesium 2.3; Potassium 4.3; Sodium 143; TSH 1.390  personally reviewed   Lipid Panel     Component Value Date/Time   CHOL 134 07/23/2017 0847   TRIG 153 (H) 07/23/2017 0847   HDL 40 07/23/2017 0847   CHOLHDL 3.4 07/23/2017 0847   CHOLHDL 4 10/22/2013 1501   VLDL 32.6 10/22/2013 1501   LDLCALC 63 07/23/2017 0847   personally reviewed   Wt Readings from Last 3 Encounters:  01/03/20 226 lb 3.2 oz (102.6 kg)  12/07/19 224 lb 12.8 oz (102 kg)  08/11/19 223 lb 12.8 oz (101.5 kg)   echo 02/05/2016 reviewed   Other studies personally reviewed: Additional studies/ records that were reviewed today include: Dr 02/07/2016 notes are reviewed  Review of the above records today demonstrates: as above   ASSESSMENT AND PLAN:  1.  Paroxysmal atrial fibrillation The patient has symptomatic, recurrent paroxysmal atrial fibrillation. she has taken medical therapy with tikosyn. Chads2vasc score is 2.  she is anticoagulated with xarelto . Therapeutic  strategies for afib including medicine (amiodarone) and ablation were discussed in detail with the patient today. Risk, benefits, and alternatives to EP study and radiofrequency ablation for afib were also discussed in detail today.  At this time, she is clear that she would prefer to continue medical therapy with tikosyn and avoid ablation. No changes today. We discussed lifestyle modification at length.  I will have her follow-up in the AF clinic for reinforcement. Update echo  2. Chronic diastolic dysfunction Stable No change required today  3. HL Continue atorvastatin  4. OSA Reports compliance with CPAP  Risks, benefits and potential toxicities for medications prescribed and/or refilled reviewed with patient today.    Follow-up:  3 months with AF clinic.  Follow closely  with Dr Elease Hashimoto. I will see as needed  Current medicines are reviewed at length with the patient today.   The patient does not have concerns regarding her medicines.  The following changes were made today:  none  Labs/ tests ordered today include:  No orders of the defined types were placed in this encounter.    Randolm Idol, MD  01/03/2020 12:14 PM     [facility] [address] [place] [address] [phone] (office) [phone] (fax)

## 2020-01-10 ENCOUNTER — Other Ambulatory Visit: Payer: Self-pay | Admitting: Cardiovascular Disease

## 2020-01-18 DIAGNOSIS — R030 Elevated blood-pressure reading, without diagnosis of hypertension: Secondary | ICD-10-CM | POA: Diagnosis not present

## 2020-01-18 DIAGNOSIS — M47816 Spondylosis without myelopathy or radiculopathy, lumbar region: Secondary | ICD-10-CM | POA: Diagnosis not present

## 2020-01-18 DIAGNOSIS — M4126 Other idiopathic scoliosis, lumbar region: Secondary | ICD-10-CM | POA: Diagnosis not present

## 2020-01-18 DIAGNOSIS — Z6832 Body mass index (BMI) 32.0-32.9, adult: Secondary | ICD-10-CM | POA: Diagnosis not present

## 2020-01-28 ENCOUNTER — Other Ambulatory Visit: Payer: Self-pay

## 2020-01-28 ENCOUNTER — Ambulatory Visit (HOSPITAL_COMMUNITY): Payer: Medicare PPO | Attending: Cardiovascular Disease

## 2020-01-28 ENCOUNTER — Other Ambulatory Visit: Payer: Self-pay | Admitting: Cardiovascular Disease

## 2020-01-28 DIAGNOSIS — I480 Paroxysmal atrial fibrillation: Secondary | ICD-10-CM

## 2020-01-28 LAB — ECHOCARDIOGRAM COMPLETE
Area-P 1/2: 3.53 cm2
S' Lateral: 3 cm

## 2020-02-01 ENCOUNTER — Other Ambulatory Visit: Payer: Self-pay | Admitting: *Deleted

## 2020-02-01 MED ORDER — FUROSEMIDE 40 MG PO TABS: 40.0000 mg | ORAL_TABLET | Freq: Two times a day (BID) | ORAL | 2 refills | Status: DC

## 2020-02-01 MED ORDER — SPIRONOLACTONE 25 MG PO TABS: 25.0000 mg | ORAL_TABLET | Freq: Every day | ORAL | 2 refills | Status: DC

## 2020-02-04 ENCOUNTER — Telehealth: Payer: Self-pay | Admitting: Internal Medicine

## 2020-02-04 NOTE — Telephone Encounter (Signed)
Patient is returning call to discuss echo results. °

## 2020-02-04 NOTE — Telephone Encounter (Signed)
-----   Message from [name], MD sent at 01/31/2020 10:02 PM EST ----- Results reviewed.  [name], please inform pt of result. I will route to primary care also.

## 2020-02-04 NOTE — Telephone Encounter (Signed)
The patient has been notified of the result and verbalized understanding.  All questions (if any) were answered. Sampson Goon, RN 02/04/2020 12:41 PM

## 2020-02-09 DIAGNOSIS — I480 Paroxysmal atrial fibrillation: Secondary | ICD-10-CM | POA: Diagnosis not present

## 2020-02-09 DIAGNOSIS — E785 Hyperlipidemia, unspecified: Secondary | ICD-10-CM | POA: Diagnosis not present

## 2020-02-14 DIAGNOSIS — H40023 Open angle with borderline findings, high risk, bilateral: Secondary | ICD-10-CM | POA: Diagnosis not present

## 2020-02-15 DIAGNOSIS — N1831 Chronic kidney disease, stage 3a: Secondary | ICD-10-CM | POA: Diagnosis not present

## 2020-02-15 DIAGNOSIS — I5189 Other ill-defined heart diseases: Secondary | ICD-10-CM | POA: Diagnosis not present

## 2020-02-15 DIAGNOSIS — J984 Other disorders of lung: Secondary | ICD-10-CM | POA: Diagnosis not present

## 2020-02-15 DIAGNOSIS — Z0000 Encounter for general adult medical examination without abnormal findings: Secondary | ICD-10-CM | POA: Diagnosis not present

## 2020-02-15 DIAGNOSIS — I425 Other restrictive cardiomyopathy: Secondary | ICD-10-CM | POA: Diagnosis not present

## 2020-02-15 DIAGNOSIS — Z7901 Long term (current) use of anticoagulants: Secondary | ICD-10-CM | POA: Diagnosis not present

## 2020-02-15 DIAGNOSIS — I480 Paroxysmal atrial fibrillation: Secondary | ICD-10-CM | POA: Diagnosis not present

## 2020-02-15 DIAGNOSIS — I2721 Secondary pulmonary arterial hypertension: Secondary | ICD-10-CM | POA: Diagnosis not present

## 2020-02-15 DIAGNOSIS — D6869 Other thrombophilia: Secondary | ICD-10-CM | POA: Diagnosis not present

## 2020-02-23 DIAGNOSIS — H43813 Vitreous degeneration, bilateral: Secondary | ICD-10-CM | POA: Diagnosis not present

## 2020-02-23 DIAGNOSIS — H35373 Puckering of macula, bilateral: Secondary | ICD-10-CM | POA: Diagnosis not present

## 2020-02-23 DIAGNOSIS — H35363 Drusen (degenerative) of macula, bilateral: Secondary | ICD-10-CM | POA: Diagnosis not present

## 2020-02-23 DIAGNOSIS — Z961 Presence of intraocular lens: Secondary | ICD-10-CM | POA: Diagnosis not present

## 2020-02-23 DIAGNOSIS — H353131 Nonexudative age-related macular degeneration, bilateral, early dry stage: Secondary | ICD-10-CM | POA: Diagnosis not present

## 2020-02-23 DIAGNOSIS — H35453 Secondary pigmentary degeneration, bilateral: Secondary | ICD-10-CM | POA: Diagnosis not present

## 2020-04-03 ENCOUNTER — Other Ambulatory Visit: Payer: Self-pay

## 2020-04-03 ENCOUNTER — Encounter (HOSPITAL_COMMUNITY): Payer: Self-pay | Admitting: Nurse Practitioner

## 2020-04-03 ENCOUNTER — Ambulatory Visit (HOSPITAL_COMMUNITY)
Admission: RE | Admit: 2020-04-03 | Discharge: 2020-04-03 | Disposition: A | Discharge status: Home / Self Care | Payer: Medicare PPO | Source: Ambulatory Visit | Attending: Nurse Practitioner | Admitting: Nurse Practitioner

## 2020-04-03 VITALS — BP 142/80 | HR 56 | Ht 69.0 in | Wt 229.8 lb

## 2020-04-03 DIAGNOSIS — G4733 Obstructive sleep apnea (adult) (pediatric): Secondary | ICD-10-CM | POA: Diagnosis not present

## 2020-04-03 DIAGNOSIS — D6869 Other thrombophilia: Secondary | ICD-10-CM

## 2020-04-03 DIAGNOSIS — G252 Other specified forms of tremor: Secondary | ICD-10-CM | POA: Insufficient documentation

## 2020-04-03 DIAGNOSIS — Z885 Allergy status to narcotic agent status: Secondary | ICD-10-CM | POA: Insufficient documentation

## 2020-04-03 DIAGNOSIS — Z7182 Exercise counseling: Secondary | ICD-10-CM | POA: Insufficient documentation

## 2020-04-03 DIAGNOSIS — Z9989 Dependence on other enabling machines and devices: Secondary | ICD-10-CM | POA: Diagnosis not present

## 2020-04-03 DIAGNOSIS — I480 Paroxysmal atrial fibrillation: Secondary | ICD-10-CM

## 2020-04-03 DIAGNOSIS — R5383 Other fatigue: Secondary | ICD-10-CM | POA: Diagnosis not present

## 2020-04-03 DIAGNOSIS — Z880 Allergy status to penicillin: Secondary | ICD-10-CM | POA: Insufficient documentation

## 2020-04-03 DIAGNOSIS — Z79899 Other long term (current) drug therapy: Secondary | ICD-10-CM | POA: Insufficient documentation

## 2020-04-03 DIAGNOSIS — R0602 Shortness of breath: Secondary | ICD-10-CM | POA: Insufficient documentation

## 2020-04-03 DIAGNOSIS — Z6833 Body mass index (BMI) 33.0-33.9, adult: Secondary | ICD-10-CM | POA: Insufficient documentation

## 2020-04-03 DIAGNOSIS — Z888 Allergy status to other drugs, medicaments and biological substances status: Secondary | ICD-10-CM | POA: Insufficient documentation

## 2020-04-03 DIAGNOSIS — E669 Obesity, unspecified: Secondary | ICD-10-CM | POA: Insufficient documentation

## 2020-04-03 DIAGNOSIS — R0600 Dyspnea, unspecified: Secondary | ICD-10-CM

## 2020-04-03 DIAGNOSIS — Z7901 Long term (current) use of anticoagulants: Secondary | ICD-10-CM | POA: Diagnosis not present

## 2020-04-03 LAB — BASIC METABOLIC PANEL WITH GFR
Anion gap: 7 (ref 5–15)
BUN: 14 mg/dL (ref 8–23)
CO2: 29 mmol/L (ref 22–32)
Calcium: 9.3 mg/dL (ref 8.9–10.3)
Chloride: 103 mmol/L (ref 98–111)
Creatinine, Ser: 1 mg/dL (ref 0.44–1.00)
GFR, Estimated: >60 mL/min
Glucose, Bld: 101 mg/dL — ABNORMAL HIGH (ref 70–99)
Potassium: 4.5 mmol/L (ref 3.5–5.1)
Sodium: 139 mmol/L (ref 135–145)

## 2020-04-03 LAB — MAGNESIUM: Magnesium: 2.4 mg/dL (ref 1.7–2.4)

## 2020-04-03 NOTE — Progress Notes (Signed)
Primary Care Physician: Creola Corn, MD Referring Physician: Dr. Johney Frame Cardiologist: Dr. Elease Hashimoto Pulmonologist: Dr. Waldemar Dickens is a 70 y.o. female with a h/o paroxysmal afib that is in  the afib clinic for Tikosyn surveillance. She  had a run of Afib last  October that settled down with prn use of propanolol. She is in SR today, has not noted any heart irregularity. She is c/o shortness of breath with exertion and losing her voice form shortness of breath if she speaks too long. She had been sedentary most of the fall and winter, but is trying to walk 1/2 mile 2x a day. Her echo in January showed normal EF. CT coronary  from 02/2018 showed 0 calcium score and normal arteries. She does have bilateral hand tremors and Neurology appointment is pending in April. She sees Dr. Vassie Loll for surveillance of her OSA. She is using her CPAP. CT of chest was normal at that time.    Today, she denies symptoms of palpitations, chest pain, shortness of breath, orthopnea, PND, lower extremity edema, dizziness, presyncope, syncope, or neurologic sequela. The patient is tolerating medications without difficulties and is otherwise without complaint today.   Past Medical History:  Diagnosis Date  . Back pain    nerve problems-had series of three injections  . Chronic anticoagulation   . Diastolic congestive heart failure (HCC)   . Hearing loss   . Hyperlipemia   . Migraine headache   . Obstructive sleep apnea    uses CPAP  . Paroxysmal atrial fibrillation (HCC)   . Restrictive cardiomyopathy (HCC)   . Tremor   . Tubal pregnancy    Past Surgical History:  Procedure Laterality Date  . BREAST EXCISIONAL BIOPSY Left    benign  . CARDIAC CATHETERIZATION    . CARDIOVERSION    . CHOLECYSTECTOMY    . ECTOPIC PREGNANCY SURGERY  1980  . EYE SURGERY     left eye injections x2-was having "stroke like" flashes of light  . heart biopsy  2008   [facility]  . HYSTEROSCOPY  6/05   and D&C-simple  hyperplasia secondary PMP bleeding  . ROTATOR CUFF REPAIR     bilateral    Current Outpatient Medications  Medication Sig Dispense Refill  . albuterol (VENTOLIN HFA) 108 (90 Base) MCG/ACT inhaler Inhale 2 puffs into the lungs every 6 (six) hours as needed for wheezing or shortness of breath. 18 g 12  . atorvastatin (LIPITOR) 20 MG tablet TAKE 1 TABLET BY MOUTH  DAILY AT 6PM 90 tablet 2  . dofetilide (TIKOSYN) 125 MCG capsule TAKE 3 CAPSULES BY MOUTH  TWICE DAILY 540 capsule 3  . ezetimibe (ZETIA) 10 MG tablet TAKE 1 TABLET BY MOUTH  DAILY 90 tablet 3  . furosemide (LASIX) 40 MG tablet Take 1 tablet (40 mg total) by mouth 2 (two) times daily. 180 tablet 2  . gabapentin (NEURONTIN) 600 MG tablet Take 900 mg by mouth at bedtime.    . Multiple Vitamins-Minerals (ICAPS AREDS 2) CAPS Take 1 capsule by mouth 2 (two) times daily.    . potassium chloride SA (KLOR-CON) 20 MEQ tablet TAKE 1 TABLET BY MOUTH  TWICE DAILY 180 tablet 3  . propranolol (INDERAL) 10 MG tablet Take 1 tablet (10 mg total) by mouth 4 (four) times daily as needed (for atrial fibrillation). 90 tablet 2  . rivaroxaban (XARELTO) 20 MG TABS tablet TAKE 1 TABLET BY MOUTH  DAILY WITH SUPPER 90 tablet 2  .  spironolactone (ALDACTONE) 25 MG tablet Take 1 tablet (25 mg total) by mouth daily. 90 tablet 2  . triamcinolone cream (KENALOG) 0.5 % APPLY TO AFFECTED AREA 2 3 A DAY     No current facility-administered medications for this encounter.    Allergies  Allergen Reactions  . Topamax [Topiramate] Other (See Comments)    hallucinations   . Codeine Nausea And Vomiting    VERTIGO  . Oxycodone     Hives   . Penicillins Rash    Social History   Socioeconomic History  . Marital status: Married    Spouse name: [name]  . Number of children: 0  . Years of education: college  . Highest education level: Not on file  Occupational History    Comment: retired  Tobacco Use  . Smoking status: Never Smoker  . Smokeless tobacco: Never  Used  Vaping Use  . Vaping Use: Never used  Substance and Sexual Activity  . Alcohol use: Not Currently  . Drug use: No  . Sexual activity: Not Currently    Birth control/protection: Post-menopausal  Other Topics Concern  . Not on file  Social History Narrative   Patient lives in [place] with her husband [name])  and she is retired. College education.   Caffeine- 2-3 cups tea daily .   Social Determinants of Health   Financial Resource Strain: Not on file  Food Insecurity: Not on file  Transportation Needs: Not on file  Physical Activity: Not on file  Stress: Not on file  Social Connections: Not on file  Intimate Partner Violence: Not on file    Family History  Problem Relation Age of Onset  . Hypertension Father   . Atrial fibrillation Mother   . Cancer Maternal Grandmother        mouth  . Cancer Maternal Uncle        unknown type  . Thyroid cancer Sister   . Breast cancer Sister        breast cancer  . Heart disease Sister     ROS- All systems are reviewed and negative except as per the HPI above  Physical Exam: Vitals:   04/03/20 0932  BP: (!) 142/80  Pulse: (!) 56  Weight: 104.2 kg  Height: 5\' 9"  (1.753 m)   Wt Readings from Last 3 Encounters:  04/03/20 104.2 kg  01/03/20 102.6 kg  12/07/19 102 kg    Labs: Lab Results  Component Value Date   NA 143 12/07/2019   K 4.3 12/07/2019   CL 102 12/07/2019   CO2 29 12/07/2019   GLUCOSE 103 (H) 12/07/2019   BUN 16 12/07/2019   CREATININE 0.89 12/07/2019   CALCIUM 9.5 12/07/2019   MG 2.3 12/07/2019   Lab Results  Component Value Date   INR 1.30 09/30/2013   Lab Results  Component Value Date   CHOL 134 07/23/2017   HDL 40 07/23/2017   LDLCALC 63 07/23/2017   TRIG 153 (H) 07/23/2017     GEN- The patient is well appearing, alert and oriented x 3 today.   Head- normocephalic, atraumatic Eyes-  Sclera clear, conjunctiva pink Ears- hearing intact Oropharynx- clear Neck- supple, no JVP Lymph-  no cervical lymphadenopathy Lungs- Clear to ausculation bilaterally, normal work of breathing Heart- Regular rate and rhythm, no murmurs, rubs or gallops, PMI not laterally displaced GI- soft, NT, ND, + BS Extremities- no clubbing, cyanosis, or edema MS- no significant deformity or atrophy Skin- no rash or lesion Psych- euthymic mood, full  affect Neuro- strength and sensation are intact  EKG-Sinus brady at 56 bpm, pr int 134 ms, qrs int 72 ms, qtc 428 ms   Epic records reviewed   Assessment and Plan:  1. Afib  Staying in SR with tikosyn, no afib to report  Continue  dofetilide 125 mcg , 3 tabs am and pm Bmet/mag today   2. CHA2DS2VASc score of 3 Continue xarelto 20 mg daily    3. OSA Uses cpap Per Dr. Vassie Loll  4. C/o exertional shortness of breath  CXR  today  Recent echo showed normal EF Calcium score in 2020 showed 0 calcium score with normal coronaries  Reviewing records appears to be somewhat chronic  Discuss with Dr. Elease Hashimoto again on appointment coming up in May  and with Dr. Vassie Loll  5. 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 BMI of 33.94 Weight loss and regular exercise encouraged   F/u with Dr. Elease Hashimoto in May  as scheduled  Afib  clinic as needed  [facility] [address] [place], [place] [address] [phone]

## 2020-04-04 ENCOUNTER — Other Ambulatory Visit: Payer: Self-pay | Admitting: Obstetrics & Gynecology

## 2020-04-04 ENCOUNTER — Encounter (HOSPITAL_COMMUNITY): Payer: Self-pay | Admitting: *Deleted

## 2020-04-04 DIAGNOSIS — Z1231 Encounter for screening mammogram for malignant neoplasm of breast: Secondary | ICD-10-CM

## 2020-04-05 DIAGNOSIS — R519 Headache, unspecified: Secondary | ICD-10-CM | POA: Diagnosis not present

## 2020-04-05 DIAGNOSIS — G43719 Chronic migraine without aura, intractable, without status migrainosus: Secondary | ICD-10-CM | POA: Diagnosis not present

## 2020-04-05 DIAGNOSIS — G43019 Migraine without aura, intractable, without status migrainosus: Secondary | ICD-10-CM | POA: Diagnosis not present

## 2020-04-26 ENCOUNTER — Other Ambulatory Visit: Payer: Self-pay | Admitting: Cardiovascular Disease

## 2020-04-26 NOTE — Telephone Encounter (Signed)
Prescription refill request for Xarelto received.  Indication: afib  Last office visit: [place], 01/03/2020 Weight: 104.2 kg  Age: 70 yo  Scr: 1.00, 04/03/2020 CrCl: 87 ml/min   Pt is on the correct dose of Xarelto per dosing criteria, prescription refill sent for Xarelto 20mg  daily.

## 2020-05-06 DIAGNOSIS — Z1211 Encounter for screening for malignant neoplasm of colon: Secondary | ICD-10-CM | POA: Diagnosis not present

## 2020-05-06 DIAGNOSIS — Z1212 Encounter for screening for malignant neoplasm of rectum: Secondary | ICD-10-CM | POA: Diagnosis not present

## 2020-05-12 LAB — COLOGUARD: COLOGUARD: NEGATIVE

## 2020-05-17 ENCOUNTER — Encounter: Payer: Self-pay | Admitting: *Deleted

## 2020-05-19 ENCOUNTER — Other Ambulatory Visit: Payer: Self-pay

## 2020-05-19 ENCOUNTER — Ambulatory Visit: Payer: Medicare PPO | Admitting: Diagnostic Neuroimaging

## 2020-05-19 ENCOUNTER — Encounter: Payer: Self-pay | Admitting: Diagnostic Neuroimaging

## 2020-05-19 VITALS — BP 118/72 | HR 60 | Ht 69.0 in | Wt 221.5 lb

## 2020-05-19 DIAGNOSIS — G250 Essential tremor: Secondary | ICD-10-CM

## 2020-05-19 NOTE — Patient Instructions (Signed)
  ESSENTIAL TREMOR - regarding tremor control, start scheduled propranolol 10mg  twice a day; may gradually increase to 20mg  twice a day as tolerated (has rx from cardiology for atrial fibrillation)

## 2020-05-19 NOTE — Progress Notes (Signed)
[facility]  PATIENT: [name] DOB: 06/05/1950  REFERRING CLINICIAN: Jessee Avers HISTORY FROM: patient  REASON FOR VISIT: follow up    HISTORICAL  CHIEF COMPLAINT:  Chief Complaint  Patient presents with  . New Patient (Initial Visit)    Rm 6 alone Last visit was in 2018  . Tremors    Pt reports she is here to discuss worsening tremors- reports bilateral hands are affected and has noticed chin will tremor now. Sts sx are most prevalent when she is tired.     HISTORY OF PRESENT ILLNESS:   UPDATE (05/19/20, VRP): Since last visit, tremor continues and now patient wants to try meds. Has propranolol as needed (rarely uses currently) for atrial fibrillation. Tremor worse with fatigue.  UPDATE 07/29/16: Since last visit, doing a little better. Now working in garden and around the home, and feeling better. Walking short distances at home (5-10 minutes). Tremor stable.   PRIOR HPI (03/25/16): 70 year old female here for evaluation of shortness of breath and dyspnea on exertion. Since January 2018 patient has had intermittent problems with shortness of breath and dyspnea on exertion, and has had thorough evaluation by pulmonary and cardiology physicians, without specific cause found. She does have history of atrial fibrillation, atrial flutter, congestive heart failure, but these do not appear to be explain patient's dyspnea symptoms adequately, and therefore possibility of an occult neuromuscular disease has been raised and therefore patient presenting to me for further evaluation. Patient requested for me to see her for this problem due to her meeting me while I was treating one of her relatives.  Patient reports a variety of constitutional symptoms include chest pain, ringing in ears spinning sensation shortness of breath snoring tremor. Patient can feel shortness of breath when she exerts herself as well as at rest. Sometimes she thinks about a problem her symptoms can  get worse. She denies any muscle twitching, muscle weakness focally.   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 Multiple family members on her father's side including father, sister, nephew and others have history of tremor. Some of them have tremor in chin, head and some in the hands. Patient has been tried on primidone in the past without relief and some aggravation of symptoms.    REVIEW OF SYSTEMS: Full 14 system review of systems performed and negative with exception of: hearing loss.   ALLERGIES: Allergies  Allergen Reactions  . Topamax [Topiramate] Other (See Comments)    hallucinations   . Codeine Nausea And Vomiting    VERTIGO  . Oxycodone     Hives   . Penicillins Rash    HOME MEDICATIONS: Outpatient Medications Prior to Visit  Medication Sig Dispense Refill  . albuterol (VENTOLIN HFA) 108 (90 Base) MCG/ACT inhaler Inhale 2 puffs into the lungs every 6 (six) hours as needed for wheezing or shortness of breath. 18 g 12  . atorvastatin (LIPITOR) 20 MG tablet TAKE 1 TABLET BY MOUTH DAILY AT 6PM 90 tablet 2  . dofetilide (TIKOSYN) 125 MCG capsule TAKE 3 CAPSULES BY MOUTH  TWICE DAILY 540 capsule 3  . ezetimibe (ZETIA) 10 MG tablet TAKE 1 TABLET EVERY DAY 90 tablet 3  . furosemide (LASIX) 40 MG tablet Take 1 tablet (40 mg total) by mouth 2 (two) times daily. 180 tablet 2  . gabapentin (NEURONTIN) 600 MG tablet Take 900 mg by mouth at bedtime.    [name] LIDOCAINE-MENTHOL, SPRAY, EX Apply  topically.    . metoCLOPramide (REGLAN) 10 MG tablet Take 10 mg by mouth 4 (four) times daily.    . Multiple Vitamins-Minerals (ICAPS AREDS 2) CAPS Take 1 capsule by mouth 2 (two) times daily.    . potassium chloride SA (KLOR-CON) 20 MEQ tablet TAKE 1 TABLET BY MOUTH  TWICE DAILY 180 tablet 3  . propranolol (INDERAL) 10 MG tablet Take 1 tablet (10 mg total) by mouth 4 (four) times daily as needed (for atrial fibrillation). 90 tablet 2   . spironolactone (ALDACTONE) 25 MG tablet Take 1 tablet (25 mg total) by mouth daily. 90 tablet 2  . triamcinolone cream (KENALOG) 0.5 % APPLY TO AFFECTED AREA 2 3 A DAY    . XARELTO 20 MG TABS tablet TAKE 1 TABLET BY MOUTH  DAILY WITH SUPPER 90 tablet 1   No facility-administered medications prior to visit.    PAST MEDICAL HISTORY: Past Medical History:  Diagnosis Date  . Atrial fibrillation (HCC)   . Back pain    nerve problems-had series of three injections  . Chronic anticoagulation   . Diastolic congestive heart failure (HCC)   . Hearing loss   . History of shingles   . Hyperlipemia   . Migraine headache   . Obstructive sleep apnea    uses CPAP  . OSA on CPAP   . Paroxysmal atrial fibrillation (HCC)   . Restrictive cardiomyopathy (HCC)   . Tremor   . Tubal pregnancy     PAST SURGICAL HISTORY: Past Surgical History:  Procedure Laterality Date  . BREAST EXCISIONAL BIOPSY Left    benign  . CARDIAC CATHETERIZATION    . CARDIOVERSION    . CHOLECYSTECTOMY    . ECTOPIC PREGNANCY SURGERY  1980  . EYE SURGERY     left eye injections x2-was having "stroke like" flashes of light  . heart biopsy  2008   [facility]  . HYSTEROSCOPY  6/05   and D&C-simple hyperplasia secondary PMP bleeding  . ROTATOR CUFF REPAIR     bilateral    FAMILY HISTORY: Family History  Problem Relation Age of Onset  . Hypertension Father   . Dementia Father   . Seizures Father   . Atrial fibrillation Mother   . Cancer Maternal Grandmother        mouth  . Cancer Maternal Uncle        unknown type  . Thyroid cancer Sister   . Breast cancer Sister        breast cancer  . Heart disease Sister   . Atrial fibrillation Sister   . Dementia Sister     SOCIAL HISTORY:  Social History   Socioeconomic History  . Marital status: Married    Spouse name: [name]  . Number of children: 0  . Years of education: college  . Highest education level: Not on file  Occupational History    Comment:  retired  Tobacco Use  . Smoking status: Never Smoker  . Smokeless tobacco: Never Used  Vaping Use  . Vaping Use: Never used  Substance and Sexual Activity  . Alcohol use: Not Currently  . Drug use: No  . Sexual activity: Not Currently    Birth control/protection: Post-menopausal  Other Topics Concern  . Not on file  Social History Narrative   Patient lives in [place] with her husband [name])  and she is retired. College education.   Caffeine- 2-3 cups tea daily .   Social Determinants of Health   Financial Resource Strain:  Not on file  Food Insecurity: Not on file  Transportation Needs: Not on file  Physical Activity: Not on file  Stress: Not on file  Social Connections: Not on file  Intimate Partner Violence: Not on file     PHYSICAL EXAM  GENERAL EXAM/CONSTITUTIONAL: Vitals:  Vitals:   05/19/20 0843  BP: 118/72  Pulse: 60  Weight: 221 lb 8 oz (100.5 kg)  Height: 5\' 9"  (1.753 m)   Wt Readings from Last 3 Encounters:  05/19/20 221 lb 8 oz (100.5 kg)  04/03/20 229 lb 12.8 oz (104.2 kg)  01/03/20 226 lb 3.2 oz (102.6 kg)   Body mass index is 32.71 kg/m. No exam data present  Patient is in no distress; well developed, nourished and groomed; neck is supple  CARDIOVASCULAR:  Examination of carotid arteries is normal; no carotid bruits  Regular rate and rhythm, no murmurs  Examination of peripheral vascular system by observation and palpation is normal  EYES:  Ophthalmoscopic exam of optic discs and posterior segments is normal; no papilledema or hemorrhages  MUSCULOSKELETAL:  Gait, strength, tone, movements noted in Neurologic exam below  NEUROLOGIC: MENTAL STATUS:  No flowsheet data found.  awake, alert, oriented to person, place and time  recent and remote memory intact  normal attention and concentration  language fluent, comprehension intact, naming intact,   fund of knowledge appropriate  CRANIAL NERVE:   2nd - no papilledema on  fundoscopic exam  2nd, 3rd, 4th, 6th - pupils equal and reactive to light, visual fields full to confrontation, extraocular muscles intact, no nystagmus  5th - facial sensation symmetric  7th - facial strength symmetric  8th - hearing intact  9th - palate elevates symmetrically, uvula midline  11th - shoulder shrug symmetric  12th - tongue protrusion midline  MILD HEAD TREMOR  MOTOR:   normal bulk and tone, full strength in the BUE, BLE  POSTURAL TREMOR IN BUE  NO BRADYKINESIA  SENSORY:   normal and symmetric to light touch, temperature, vibration  COORDINATION:   finger-nose-finger, fine finger movements --> ACTION TREMOR  REFLEXES:   deep tendon reflexes present and symmetric  TRACE AT ANKLES  GAIT/STATION:   narrow based gait    DIAGNOSTIC DATA (LABS, IMAGING, TESTING) - I reviewed patient records, labs, notes, testing and imaging myself where available.  Lab Results  Component Value Date   WBC 8.1 07/10/2016   HGB 13.8 07/10/2016   HCT 40.1 07/10/2016   MCV 84 07/10/2016   PLT 273 07/10/2016      Component Value Date/Time   NA 139 04/03/2020 1000   NA 143 12/07/2019 0839   K 4.5 04/03/2020 1000   CL 103 04/03/2020 1000   CO2 29 04/03/2020 1000   GLUCOSE 101 (H) 04/03/2020 1000   BUN 14 04/03/2020 1000   BUN 16 12/07/2019 0839   CREATININE 1.00 04/03/2020 1000   CREATININE 0.95 01/01/2016 0839   CALCIUM 9.3 04/03/2020 1000   PROT 7.0 07/23/2017 0847   ALBUMIN 4.2 07/23/2017 0847   AST 14 07/23/2017 0847   ALT 9 07/23/2017 0847   ALKPHOS 127 (H) 07/23/2017 0847   BILITOT 0.4 07/23/2017 0847   GFRNONAA >60 04/03/2020 1000   GFRAA 76 12/07/2019 0839   Lab Results  Component Value Date   CHOL 134 07/23/2017   HDL 40 07/23/2017   LDLCALC 63 07/23/2017   TRIG 153 (H) 07/23/2017   CHOLHDL 3.4 07/23/2017   Lab Results  Component Value Date   HGBA1C 5.8 (  H) 03/25/2016   No results found for: VITAMINB12 Lab Results  Component Value  Date   TSH 1.390 12/07/2019    03/05/16 Dg Sniff chest - Normal diaphragmatic function.  06/12/12 MRI lumbar [I reviewed images myself and agree with interpretation. -VRP]  - No distinct cause of left hip pain is identified. - Curvature convex to the right with the apex at L2-3. - Shallow central disc herniation at L3-4 that contacts the thecal sac but does not appear to cause neural compression.  Mild facet arthropathy at this level that could conceivably be symptomatic. - L4-5:  Bulging of the disc.  Bilateral facet degeneration.  No apparent compressive stenosis.  The facet disease could possibly be symptomatic. - L5 S1:  Right foraminal to extraforaminal osteophyte and bulging disc could possibly irritate the right L5 nerve root.  Definite neural compression is not demonstrated however.  04/18/16 exercise testing - Exercise testing with gas exchange demonstrates normal functional capacity when compared to matched sedentary norms. There is no clear indication for cardiopulmonary limitation. At peak exercise and with flat O2 pulse pattern, patient is likely limited due to diastolic dysfunction as a result of her body habitus. Expect weight loss and regular exercise would greatly improve exercise intolerance.  04/23/16 MRI brain (with and without)  1.   The brain parenchyma appears normal for age 68.   There is an air fluid level within the left maxillary sinus consistent with acute sinusitis 3.   No acute findings.  03/25/16 LABS - CK, aldolase, AchR, TSH --> all normal    ASSESSMENT AND PLAN  70 y.o. year old female here with here with new onset dyspnea on exertion, shortness of breath, fatigue, since January 2018. No evidence of underlying neuromuscular disorder.  Also with history of essential tremor.  Dx: shortness of breath / fatigue (due to mild deconditioning and obesity) + ESSENTIAL TREMOR  1. Essential tremor      PLAN:   ESSENTIAL TREMOR - regarding tremor control,  start scheduled propranolol 10mg  twice a day; may gradually increase to 20mg  twice a day as tolerated (has rx from cardiology for atrial fibrillation) - may consider increasing gabapentin in future; may consider primidone as well - may consider deep brain stimulator in future  Return in about 6 months (around 11/18/2020).    , MD 05/19/2020, 9:34 AM Certified in Neurology, Neurophysiology and Neuroimaging  [facility] [address], [address] [place], [address] [place] [phone]

## 2020-05-29 ENCOUNTER — Ambulatory Visit
Admission: RE | Admit: 2020-05-29 | Discharge: 2020-05-29 | Disposition: A | Discharge status: Home / Self Care | Payer: Medicare PPO | Source: Ambulatory Visit | Attending: Obstetrics & Gynecology | Admitting: Obstetrics & Gynecology

## 2020-05-29 ENCOUNTER — Other Ambulatory Visit: Payer: Self-pay

## 2020-05-29 DIAGNOSIS — Z1231 Encounter for screening mammogram for malignant neoplasm of breast: Secondary | ICD-10-CM

## 2020-05-30 ENCOUNTER — Ambulatory Visit (HOSPITAL_BASED_OUTPATIENT_CLINIC_OR_DEPARTMENT_OTHER): Payer: Medicare PPO | Admitting: Obstetrics & Gynecology

## 2020-05-31 ENCOUNTER — Other Ambulatory Visit (HOSPITAL_COMMUNITY)
Admission: RE | Admit: 2020-05-31 | Discharge: 2020-05-31 | Disposition: A | Discharge status: Home / Self Care | Payer: Medicare PPO | Source: Ambulatory Visit | Attending: Obstetrics & Gynecology | Admitting: Obstetrics & Gynecology

## 2020-05-31 ENCOUNTER — Other Ambulatory Visit: Payer: Self-pay

## 2020-05-31 ENCOUNTER — Ambulatory Visit (INDEPENDENT_AMBULATORY_CARE_PROVIDER_SITE_OTHER): Payer: Medicare PPO | Admitting: Obstetrics & Gynecology

## 2020-05-31 ENCOUNTER — Encounter (HOSPITAL_BASED_OUTPATIENT_CLINIC_OR_DEPARTMENT_OTHER): Payer: Self-pay | Admitting: Obstetrics & Gynecology

## 2020-05-31 VITALS — BP 126/68 | HR 53 | Ht 68.5 in | Wt 218.0 lb

## 2020-05-31 DIAGNOSIS — K429 Umbilical hernia without obstruction or gangrene: Secondary | ICD-10-CM

## 2020-05-31 DIAGNOSIS — Z124 Encounter for screening for malignant neoplasm of cervix: Secondary | ICD-10-CM | POA: Insufficient documentation

## 2020-05-31 DIAGNOSIS — Z780 Asymptomatic menopausal state: Secondary | ICD-10-CM | POA: Diagnosis not present

## 2020-05-31 DIAGNOSIS — Z01419 Encounter for gynecological examination (general) (routine) without abnormal findings: Secondary | ICD-10-CM

## 2020-05-31 NOTE — Progress Notes (Signed)
70 y.o. G1P0 Married White or Caucasian female here for breast and pelvic exam.  Denies vaginal bleeding.  Is being treated for tremor with Dr. Marjory Lies.  On propranolol.  Very happy with response to therapy.  She has lost 14 pounds since last year.  Feeling like she just cannot eat as much.  Feels full quickly.  States she was a little lower but has put back on a few pounds.  Has not discussed with Dr. Timothy Lasso.  We discussed her calling Dr. Timothy Lasso to let him know.  Patient's last menstrual period was 01/21/1990.          Sexually active: No.  H/O STD:  no  Health Maintenance: PCP:  Dr. Timothy Lasso.  Last wellness appt was 12/2019.  Did blood work at that appt:  Yes.  Pt reports this was all normal. Vaccines are up to date:  yes Colonoscopy:  Reports cologuard done 04/2020 and was negative MMG:  05/2020 BMD:  Done with Dr. Timothy Lasso Last pap smear:  03/2017.     H/o abnormal pap smear:  No   reports that she has never smoked. She has never used smokeless tobacco. She reports previous alcohol use. She reports that she does not use drugs.  Past Medical History:  Diagnosis Date  . Atrial fibrillation (HCC)   . Back pain    nerve problems-had series of three injections  . Bone spur    Spine  . Chronic anticoagulation   . Diastolic congestive heart failure (HCC)   . Hearing loss   . History of shingles   . Hyperlipemia   . Migraine headache   . Obstructive sleep apnea    uses CPAP  . OSA on CPAP   . Paroxysmal atrial fibrillation (HCC)   . Restrictive cardiomyopathy (HCC)   . Tremor   . Tubal pregnancy     Past Surgical History:  Procedure Laterality Date  . BREAST EXCISIONAL BIOPSY Left    benign  . CARDIAC CATHETERIZATION    . CARDIOVERSION    . CHOLECYSTECTOMY    . ECTOPIC PREGNANCY SURGERY  1980  . EYE SURGERY     left eye injections x2-was having "stroke like" flashes of light  . heart biopsy  2008   [facility]  . HYSTEROSCOPY  6/05   and D&C-simple hyperplasia secondary PMP  bleeding  . ROTATOR CUFF REPAIR     bilateral    Current Outpatient Medications  Medication Sig Dispense Refill  . albuterol (VENTOLIN HFA) 108 (90 Base) MCG/ACT inhaler Inhale 2 puffs into the lungs every 6 (six) hours as needed for wheezing or shortness of breath. 18 g 12  . atorvastatin (LIPITOR) 20 MG tablet TAKE 1 TABLET BY MOUTH DAILY AT 6PM 90 tablet 2  . dofetilide (TIKOSYN) 125 MCG capsule TAKE 3 CAPSULES BY MOUTH  TWICE DAILY 540 capsule 3  . ezetimibe (ZETIA) 10 MG tablet TAKE 1 TABLET EVERY DAY 90 tablet 3  . furosemide (LASIX) 40 MG tablet Take 1 tablet (40 mg total) by mouth 2 (two) times daily. 180 tablet 2  . gabapentin (NEURONTIN) 600 MG tablet Take 900 mg by mouth at bedtime.    [name] LIDOCAINE-MENTHOL, SPRAY, EX Apply topically.    . metoCLOPramide (REGLAN) 10 MG tablet Take 10 mg by mouth 4 (four) times daily.    . Misc Natural Products (ECHINACEA GOLDENSEAL PLUS PO) take during the winter months    . Multiple Vitamins-Minerals (ICAPS AREDS 2) CAPS Take 1 capsule by mouth 2 (two)  times daily.    . potassium chloride SA (KLOR-CON) 20 MEQ tablet TAKE 1 TABLET BY MOUTH  TWICE DAILY 180 tablet 3  . propranolol (INDERAL) 10 MG tablet Take 1 tablet (10 mg total) by mouth 4 (four) times daily as needed (for atrial fibrillation). (Patient taking differently: Take 10 mg by mouth 2 (two) times daily. 2 tab. 1 am and 1 pm) 90 tablet 2  . spironolactone (ALDACTONE) 25 MG tablet Take 1 tablet (25 mg total) by mouth daily. 90 tablet 2  . XARELTO 20 MG TABS tablet TAKE 1 TABLET BY MOUTH  DAILY WITH SUPPER 90 tablet 1  . triamcinolone cream (KENALOG) 0.5 % APPLY TO AFFECTED AREA 2 3 A DAY (Patient not taking: Reported on 05/31/2020)     No current facility-administered medications for this visit.    Family History  Problem Relation Age of Onset  . Hypertension Father   . Dementia Father   . Seizures Father   . Atrial fibrillation Mother   . Cancer Maternal Grandmother        mouth  .  Cancer Maternal Uncle        unknown type  . Thyroid cancer Sister   . Breast cancer Sister        breast cancer  . Heart disease Sister   . Atrial fibrillation Sister   . Dementia Sister     Review of Systems  Constitutional: Negative.   Gastrointestinal: Negative.   Genitourinary: Negative.     Exam:   BP 126/68   Pulse (!) 53   Ht 5' 8.5" (1.74 m)   Wt 218 lb (98.9 kg)   LMP 01/21/1990   BMI 32.66 kg/m   Height: 5' 8.5" (174 cm)  General appearance: alert, cooperative and appears stated age Breasts: normal appearance, no masses or tenderness Abdomen: soft, non-tender; bowel sounds normal; no masses,  no organomegaly, about 8cm umbilical hernia that is non tender Lymph nodes: Cervical, supraclavicular, and axillary nodes normal.  No abnormal inguinal nodes palpated Neurologic: Grossly normal  Pelvic: External genitalia:  no lesions              Urethra:  normal appearing urethra with no masses, tenderness or lesions              Bartholins and Skenes: normal                 Vagina: normal appearing vagina with atrophic changes and no discharge, no lesions              Cervix: no lesions              Pap taken: Yes.   Bimanual Exam:  Uterus:  normal size, contour, position, consistency, mobility, non-tender              Adnexa: normal adnexa and no mass, fullness, tenderness               Rectovaginal: Confirms               Anus:  normal sphincter tone, no lesions  Chaperone, Margret Chance, CMA, was present for exam.  Assessment/Plan: 1. Gynecologic exam normal - Pap smear obtained today.  Shared decision making regarding guidelines occurred today.  Pt desires to continue until 39.  Will plan to repeat in about 3 years. - MMG 05/2020 - Cologuard 04/2020, negative per pt - BMD done with Dr. Timothy Lasso - vaccines updated - Dr. Timothy Lasso does blood work - pt  is going to return in 2 years  2. Postmenopausal - no HRT  3. Umbilical hernia without obstruction and without  gangrene  4. Cervical cancer screening - PR OBTAINING SCREEN PAP SMEAR - Cytology - PAP( [place])  Total time with pt: 25 minutes

## 2020-06-01 LAB — CYTOLOGY - PAP
Adequacy: ABSENT
Diagnosis: NEGATIVE

## 2020-06-29 DIAGNOSIS — R079 Chest pain, unspecified: Secondary | ICD-10-CM | POA: Diagnosis not present

## 2020-06-29 DIAGNOSIS — R0600 Dyspnea, unspecified: Secondary | ICD-10-CM | POA: Diagnosis not present

## 2020-06-29 DIAGNOSIS — I2721 Secondary pulmonary arterial hypertension: Secondary | ICD-10-CM | POA: Diagnosis not present

## 2020-06-29 DIAGNOSIS — W57XXXA Bitten or stung by nonvenomous insect and other nonvenomous arthropods, initial encounter: Secondary | ICD-10-CM | POA: Diagnosis not present

## 2020-06-29 DIAGNOSIS — R42 Dizziness and giddiness: Secondary | ICD-10-CM | POA: Diagnosis not present

## 2020-06-29 DIAGNOSIS — I425 Other restrictive cardiomyopathy: Secondary | ICD-10-CM | POA: Diagnosis not present

## 2020-06-29 DIAGNOSIS — I480 Paroxysmal atrial fibrillation: Secondary | ICD-10-CM | POA: Diagnosis not present

## 2020-06-30 ENCOUNTER — Telehealth: Payer: Self-pay

## 2020-06-30 NOTE — Telephone Encounter (Signed)
[name] called from PCP office stating the patient saw the NP yesterday. The patient complained of worsening SOB, fatigue, and CP for about 2 months.  EKG was completed and she states was normal. They request an appointment with Cardiology. Informed her the patient will be called and offered a sooner appointment (she has a visit with Dr. Elease Hashimoto 08/03/20).  Requested records faxed to [phone].  Called and spoke with the patient. Offered her a visit with [name], NP 6/24. Will keep appointment with Dr. Elease Hashimoto as scheduled in case it is needed.   Records received and given to Chart Prep.

## 2020-07-14 ENCOUNTER — Ambulatory Visit: Payer: Medicare PPO | Admitting: Family

## 2020-07-14 ENCOUNTER — Encounter: Payer: Self-pay | Admitting: Family

## 2020-07-14 ENCOUNTER — Other Ambulatory Visit: Payer: Self-pay

## 2020-07-14 VITALS — BP 112/70 | HR 53 | Ht 68.5 in | Wt 213.0 lb

## 2020-07-14 DIAGNOSIS — Z7901 Long term (current) use of anticoagulants: Secondary | ICD-10-CM | POA: Diagnosis not present

## 2020-07-14 DIAGNOSIS — R5383 Other fatigue: Secondary | ICD-10-CM | POA: Diagnosis not present

## 2020-07-14 DIAGNOSIS — R198 Other specified symptoms and signs involving the digestive system and abdomen: Secondary | ICD-10-CM | POA: Diagnosis not present

## 2020-07-14 DIAGNOSIS — I5032 Chronic diastolic (congestive) heart failure: Secondary | ICD-10-CM

## 2020-07-14 DIAGNOSIS — I480 Paroxysmal atrial fibrillation: Secondary | ICD-10-CM | POA: Diagnosis not present

## 2020-07-14 DIAGNOSIS — R0600 Dyspnea, unspecified: Secondary | ICD-10-CM

## 2020-07-14 MED ORDER — PANTOPRAZOLE SODIUM 40 MG PO TBEC: DELAYED_RELEASE_TABLET | ORAL | 2 refills | Status: DC

## 2020-07-14 MED ORDER — PANTOPRAZOLE SODIUM 40 MG PO TBEC
DELAYED_RELEASE_TABLET | ORAL | 6 refills | Status: DC
Start: 2020-07-14 — End: 2020-07-14

## 2020-07-14 NOTE — Progress Notes (Signed)
Office Visit    Patient Name: ARALYN NOWAK Date of Encounter: 07/14/2020  PCP:  Creola Corn, MD   [facility]  Cardiologist:  Kristeen Miss, MD  Advanced Practice Provider:  No care team member to display Electrophysiologist:  None    Chief Complaint    NEALA MIGGINS is a 70 y.o. female with a hx of paroxysmal atrial fibrillation on Tikosyn, OSA on CPAP, diastolic heart failure, hyperlipidemia presents today for worsening shortness of breath, fatigue, chest pain.    Past Medical History    Past Medical History:  Diagnosis Date   Atrial fibrillation (HCC)    Back pain    nerve problems-had series of three injections   Bone spur    Spine   Chronic anticoagulation    Diastolic congestive heart failure (HCC)    Hearing loss    History of shingles    Hyperlipemia    Migraine headache    Obstructive sleep apnea    uses CPAP   OSA on CPAP    Paroxysmal atrial fibrillation (HCC)    Restrictive cardiomyopathy (HCC)    Tremor    Tubal pregnancy    Past Surgical History:  Procedure Laterality Date   BREAST EXCISIONAL BIOPSY Left    benign   CARDIAC CATHETERIZATION     CARDIOVERSION     CHOLECYSTECTOMY     ECTOPIC PREGNANCY SURGERY  1980   EYE SURGERY     left eye injections x2-was having "stroke like" flashes of light   heart biopsy  2008   [facility]   HYSTEROSCOPY  6/05   and D&C-simple hyperplasia secondary PMP bleeding   ROTATOR CUFF REPAIR     bilateral    Allergies  Allergies  Allergen Reactions   Topamax [Topiramate] Other (See Comments)    hallucinations    Codeine Nausea And Vomiting    VERTIGO   Oxycodone     Hives    Penicillins Rash    History of Present Illness    JAYLEY HUSTEAD is a 70 y.o. female with a hx of paroxysmal atrial fibrillation on Tikosyn, OSA on CPAP, diastolic heart failure, hyperlipidemia last seen 04/09/2020 in atrial fibrillation clinic.  Cardiac CTA 02/2018 with calcium score of 0.   Most recent run of atrial fibrillation October 2021 which resolved with as needed propanolol.  Echocardiogram January 2022 LVEF 60 to SV 5%, no R WMA, grade 2 diastolic dysfunction, RV normal size and function, normal PASP, mild MR.  At clinic visit 04/03/2020 with atrial fibrillation clinic she noted no palpitations but shortness of breath with exertion.  She did note that she had been sedentary most of the fall and winter and so was trying to increase activity by walking half mile twice per day.  She was recommended for chest x-ray which was normal.  Our office was contacted 06/30/20 as she saw her primary care noting worsening shortness of breath, fatigue, chest pain for 2 months.  EKG at that visit was reportedly without acute abnormality.  Clinic note from PCP states fatigue, intermittent fevers and chills, increased episodes of atrial fibrillations, 15 tick bites since March, increased DOE for 2-3 months requiring elevation of the head of the bed. CXR unremarkable per report. She was started on doxycycline due to tick bite. She also noted dizziness. BMP 06/29/20 glucose 151, creatinine 0.8, GFR 70.9, K 4.2, Na 138. CBC 06/29/20 wbc 10.84, Hb 13.1.  She presents today for follow-up. She completed course of  Doxyxycline without difficulty. Endorses shortness of breath and that the more she talks she loses her voice. When she gets up and moves she starts to feel tight in her chest as she feels more dyspneic. Notes she cannot eat much but feels as if food is "all the way stuck". Denies indigestion. No on PPI. No difficulties swallowing and no change in appetite just tells me it feels as if she is already full. Notes shortness of breath occurs most often with exertion. She endorses orthopnea and has been increasing the elevation of her bed. She is compliant with her CPAP. She notes occasional PND. Notes no edema. She is compliant with Lasix twice daily though takes second dose late in the evening with some  associated nocturia, discussed that she could take in the afternoon. No lightheadedness, dizziness, near syncope, syncope. Tells me since increasing her Propranolol to twice daily her tremors have improved but not completely resolved. Per her report her heart rate has been in the 50s and 60s for some time.   EKGs/Labs/Other Studies Reviewed:   The following studies were reviewed today:   EKG:  EKG is  ordered today.  The ekg ordered today demonstrates sinus bradycardia 53 bpm with no acute ST/T wave changes.   Recent Labs: 12/07/2019: TSH 1.390 04/03/2020: BUN 14; Creatinine, Ser 1.00; Magnesium 2.4; Potassium 4.5; Sodium 139  Recent Lipid Panel    Component Value Date/Time   CHOL 134 07/23/2017 0847   TRIG 153 (H) 07/23/2017 0847   HDL 40 07/23/2017 0847   CHOLHDL 3.4 07/23/2017 0847   CHOLHDL 4 10/22/2013 1501   VLDL 32.6 10/22/2013 1501   LDLCALC 63 07/23/2017 0847   Home Medications   Current Meds  Medication Sig   albuterol (VENTOLIN HFA) 108 (90 Base) MCG/ACT inhaler Inhale 2 puffs into the lungs every 6 (six) hours as needed for wheezing or shortness of breath.   atorvastatin (LIPITOR) 20 MG tablet TAKE 1 TABLET BY MOUTH DAILY AT 6PM   dofetilide (TIKOSYN) 125 MCG capsule TAKE 3 CAPSULES BY MOUTH  TWICE DAILY   ezetimibe (ZETIA) 10 MG tablet TAKE 1 TABLET EVERY DAY   furosemide (LASIX) 40 MG tablet Take 1 tablet (40 mg total) by mouth 2 (two) times daily.   gabapentin (NEURONTIN) 600 MG tablet Take 900 mg by mouth at bedtime.   LIDOCAINE-MENTHOL, SPRAY, EX Apply topically.   Misc Natural Products (ECHINACEA GOLDENSEAL PLUS PO) take during the winter months   Multiple Vitamins-Minerals (ICAPS AREDS 2) CAPS Take 1 capsule by mouth 2 (two) times daily.   potassium chloride SA (KLOR-CON) 20 MEQ tablet TAKE 1 TABLET BY MOUTH  TWICE DAILY   propranolol (INDERAL) 10 MG tablet Take 1 tablet (10 mg total) by mouth 4 (four) times daily as needed (for atrial fibrillation). (Patient  taking differently: Take 10 mg by mouth 2 (two) times daily.)   spironolactone (ALDACTONE) 25 MG tablet Take 1 tablet (25 mg total) by mouth daily.   XARELTO 20 MG TABS tablet TAKE 1 TABLET BY MOUTH  DAILY WITH SUPPER   [DISCONTINUED] pantoprazole (PROTONIX) 40 MG tablet Take one tablet twice daily for one week then take one tablet daily.     Review of Systems    All other systems reviewed and are otherwise negative except as noted above.  Physical Exam    VS:  BP 112/70 (BP Location: Left Arm, Patient Position: Sitting, Cuff Size: Normal)   Pulse (!) 53   Ht 5' 8.5" (1.74 m)  Wt 213 lb (96.6 kg)   LMP 01/21/1990   SpO2 98%   BMI 31.92 kg/m  , BMI Body mass index is 31.92 kg/m.  Wt Readings from Last 3 Encounters:  07/14/20 213 lb (96.6 kg)  05/31/20 218 lb (98.9 kg)  05/19/20 221 lb 8 oz (100.5 kg)    GEN: Well nourished, overweight, well developed, in no acute distress. HEENT: normal. Neck: Supple, no JVD, carotid bruits, or masses. Cardiac: bradycardic, RRR, no murmurs, rubs, or gallops. No clubbing, cyanosis, edema.  Radials/PT 2+ and equal bilaterally.  Respiratory:  Respirations regular, clear to auscultation bilaterally. Short of breath with long periods of talking.  GI: Soft, nontender, nondistended. MS: No deformity or atrophy. Skin: Warm and dry, no rash. Neuro:  Strength and sensation are intact. Psych: Normal affect.  Assessment & Plan    Paroxysmal atrial fibrillation/chronic anticoagulation/high risk medication use- Maintaining NSR. Reports tremor and palpitations improved since increasing Propranolol to 10mg  BID instead of PRN. Denies bleeding complications. Continue Xarelto 20mg  daily due to CHADS2VASc of at least 64 (age, gender, HF). Continue to follow with EP regarding Tikosyn. 06/29/20 Hb 13.1.   Dyspnea on exertion / Chest tightness / Abdominal fullness / Fatigue / Chronic diastolic heart failure- Reports 2-3 month history of worsening dyspnea on exertion  associated with chest tightness. Also notes sensation of feeling "full" - etiology hiatal hernia vs GERD which could contribute to dyspnea. Start Pronotix 40mg  BID x 1 week then reduce to 40mg  dailys. Cardiac CTA 02/2018 with calcium score of 0. Low suspicion of CAD, EKG today no acute ST/T wave changes. Echo 01/2020 normal LVEF and gr2DD. Possible etiology of diastolic heart failure exacerbation though notes no edema. Increase Lasix to 80mg  for 3 days then return to 40mg  BID. ProBNP, BMP today. TSH today to rule out hypothyroidism as contributory to fatigue. If symptoms persiste despite above interventions, consider RHC as clinically indicated. Does note previous mold exposure - consider referral to pulmonology if cardiac workup unremarkable. Anticipate deconditioning contributory, encouraged regular cardiovascular exercise.   Hyperlipidemia- Continue Atorvastatin 20mg  daily.   OSA- Continued CPAP compliance encouraged.  Disposition: Follow up  as scheduled 08/03/20  with Dr.  Signed, 03/2018, NP 07/14/2020, 2:22 PM [facility]

## 2020-07-14 NOTE — Patient Instructions (Addendum)
Medication Instructions:  Your physician has recommended you make the following change in your medication:   START Protonix 40mg  twice daily for 1 week then take once daily  CHANGE Furosemide (Lasix) to 80mg  in the morning for 3 days, then return to 40mg  twice daily. You may take your second dose in the afternoon to prevent going to the restroom at night.   *If you need a refill on your cardiac medications before your next appointment, please call your pharmacy*  Lab Work: Your provider recommends that you return for lab work today: Pro BNP, BMP, TSH  If you have labs (blood work) drawn today and your tests are completely normal, you will receive your results only by: MyChart Message (if you have MyChart) OR A paper copy in the mail If you have any lab test that is abnormal or we need to change your treatment, we will call you to review the results.   Testing/Procedures: Your EKG today showed sinus bradycardia which is a slow but regular heart rate.  Follow-Up: At [facility], you and your health needs are our priority.  As part of our continuing mission to provide you with exceptional heart care, we have created designated Provider Care Teams.  These Care Teams include your primary Cardiologist (physician) and Advanced Practice Providers (APPs -  Physician Assistants and Nurse Practitioners) who all work together to provide you with the care you need, when you need it.  We recommend signing up for the patient portal called "MyChart".  Sign up information is provided on this After Visit Summary.  MyChart is used to connect with patients for Virtual Visits (Telemedicine).  Patients are able to view lab/test results, encounter notes, upcoming appointments, etc.  Non-urgent messages can be sent to your provider as well.   To learn more about what you can do with MyChart, go to .    Your next appointment:   With Dr. as scheduled 08/03/20   Other  Instructions  Pursed Lip Breathing  How to perform pursed lip breathing Being short of breath can make you tense and anxious. Before you start this breathing exercise, take a minute to relax your shoulders and close your eyes. Then: Start the exercise by closing your mouth. Breathe in through your nose, taking a normal breath. You can do this at your normal rate of breathing. If you feel you are not getting enough air, breathe in while slowly counting to 2 or 3. Pucker (purse) your lips as if you were going to whistle. Gently tighten the muscles of your abdomenor press on your abdomen to help push the air out. Breathe out slowly through your pursed lips. Take at least twice as long to breathe out as it takes you to breathe in. Make sure that you breathe out all of the air, but do not force air out. Ask your health care provider how often and how long to do this exercise. Follow these instructions at home: Take over-the-counter and prescription medicines only as told by your health care provider. Do not use any products that contain nicotine or tobacco. These products include cigarettes, chewing tobacco, and vaping devices, such as e-cigarettes. If you need help quitting, ask your health care provider. Return to your normal activities as told by your health care provider. Ask your health care provider what activities are safe for you. Keep all follow-up visits. This is important. Where to find more information American Lung Association: lung.org Contact a health care provider if: Your shortness  of breath gets worse. You become less able to exercise or be physically active. You develop a cough. You develop a fever. You experience problems with this breathing technique. Get help right away if: You are struggling to breathe. Your shortness of breath prevents you from doing any activity. These symptoms may represent a serious problem that is an emergency. Do not wait to see if the symptoms  will go away. Get medical help right away. Call your local emergency services (911 in the [place] Do not drive yourself to the hospital. Summary Pursed lip breathing is a breathing technique that helps to remove trapped air from your lungs. This technique helps you get more oxygen into your lungs. Pursed lip breathing can help slow down your breathing and keeps your body from having to work so hard to breathe. When performing this technique, take at least twice as long to breathe out as it takes you to breathe in. This information is not intended to replace advice given to you by your health care provider. Make sure you discuss any questions you have with your healthcare provider. Document Revised: 01/10/2020 Document Reviewed: 01/10/2020 Elsevier Patient Education  2022 [employer].

## 2020-07-17 LAB — BASIC METABOLIC PANEL
BUN/Creatinine Ratio: 23 (ref 12–28)
BUN: 19 mg/dL (ref 8–27)
CO2: 26 mmol/L (ref 20–29)
Calcium: 10 mg/dL (ref 8.7–10.3)
Chloride: 100 mmol/L (ref 96–106)
Creatinine, Ser: 0.83 mg/dL (ref 0.57–1.00)
Glucose: 92 mg/dL (ref 65–99)
Potassium: 4.3 mmol/L (ref 3.5–5.2)
Sodium: 142 mmol/L (ref 134–144)
eGFR: 76 mL/min/{1.73_m2} (ref >59)

## 2020-07-17 LAB — TSH: TSH: 1.02 u[IU]/mL (ref 0.450–4.500)

## 2020-07-17 LAB — PRO B NATRIURETIC PEPTIDE: NT-Pro BNP: 237 pg/mL (ref 0–301)

## 2020-07-27 ENCOUNTER — Encounter: Payer: Self-pay | Admitting: Cardiovascular Disease

## 2020-07-27 NOTE — Progress Notes (Signed)
could go okay  Cardiology Office Note   Date:  08/03/2020   ID:  [name], DOB 1950-02-26, MRN [id number]  PCP:  Creola Corn, MD  Cardiologist:   Kristeen Miss, MD   Chief Complaint  Patient presents with   Atrial Fibrillation   1. Atrial fibrillation 2. Hyperlipidemia 3. Diastolic congestive heart failure  70 year old female with a history of atrial fibrillation. She's been well controlled on Tikosyn. She also has a history of hyperlipidemia and is on Lipitor. She's been on chronic Pradaxa therapy for anticoagulation.  February 20, 2012 [name]  has done well since I last saw her. 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 She has occasional palpitations but overall she's doing quite well.  She is exercising ( water aerobics) twice a week.  Feb. 24, 2015:  [name] is doing well.   No problems.  Maintaining NSR.  Sept. 16, 2015:  She was in the ER this past week.  CP, dyspnea .. CT angio was negative for PE - showed ? Of pneumonia.   Was treated wit Abx. Does well in the am's .[name] and perhaps more short of breath in the afternoons.   April 05, 2014: [name] is a 70 y.o. female who presents for follow-up of her atrial fibrillation. She has continued to have some DOE,  Is concerned about the atorvastatin. Was seen in the lipid clinic.    Sept. 30, 2016:  Doing well.  No CP , no dyspnea. Has lost 11 lbs since last year.  Is in NSR  April 10, 2015:  [name] is doing ok from a cardiology standpoint. Has had some dental issues   Sept.  22, 2017:  [name] is seen for follow up of her atrial fib.    No  CP or dyspnea. No further episodes of atrial fib.   Dec. 11, 2017:  [name] is seen today for followup for her atrial fib  Has some DOE  Not exercising as much as she needs to  No CP   Jan. 16, 2018: [name] is seen today as a work in visit at the request of Dr. Timothy Lasso. Been having more shortness of breath and was  thought to have worsening congestive heart failure.  Has severe DOE with any exertion. We performed a cardiac cath in 2008 which revealed normal coronaries.   She went to [facility] and had a heart biopsy.   Was not told of any abnormality.  Does have dull chest pain with exertion and with sitting   She had an echocardiogram performed yesterday which revealed normal left ventricular systolic function. She does have grade 2 diastolic dysfunction. Her Lasix was increased last week.   She has not noticed any increase in urine output.  No change in her breathing .  Has noticed that she loses her breath if she does any exertion .  Cannot lay supine - has severe dyspnea.   April 02, 2016:  [name] is seen today for follow up visit .    Seen with husband , [name]  She had an episode of PAF yesterday  - lasted for several hours.   Is back in NSR currently .  Had an episode in Feb. That lasted 2-3 hours , HR of 129 ,  She was very short of breath with that.   -Sleep study on Feb. 20 shows mild OSA -PFTs 01/2016 showed ratio 72, FVC 74%, FEV1 69%, TLC 91% and DLCO 73% suggesting moderate  intraparenchymal restriction  - echo shows normal LV systolic function with grade 2 diastolic dysfunction. She has an estimated PA pressure of 35.  Has developed a tremor.   Is seeing a neurologist .   July 10, 2016:  Had a CPX in March , Normal functional capacity compared to sedentary patients.  Still has some vocal issues ( when she gets fatigued, she loses her voice) struggles to talk  Able to get out and work in the garden Using CPAP - may have helped some   Dec. 19, 2018:  [name] is seen today for follow up of her chronic diastolic CHF [name] was [facility] earlier this year.  She had a heart catheterization which revealed normal coronary arteries.  She has had a myocardial biopsy which was reportedly normal. History of paroxysmal atrial fibrillation which seems to be  well-controlled on  Tikosyn  PFTs has shown reduction of DLCO ( 73% pred.)   Is just getting over al cold  Has continued to have issues with vertigo  Not getting out to do any exercise   July 23, 2017:  Doing well,  No CP or dyspnea. Gets short of breath with the heat.  Started getting some exercise, yoga .  No cardiac exercise  Is having some joint issues,     February 02, 2018: He is seen today for follow-up visit.  She has a history of atrial fibrillation. She remains in normal sinus rhythm on Tikosyn.   She had an episode of paroxysmal atrial fibrillation this past Thursday.  The episode lasted for about 3 hours.   She took 1 of [name]'s propranolol tablets and went back into sinus rhythm fairly quickly.  Her QT intervals have remained good.  Sept. 15, 2020   [name] is seen today . caregiving for [name]'s sister.  Wt. Today is  222 ( previous wt from Jan. 2020 was 241 lbs)   Walking some , eating fewer carbs.   April 13, 2019  [name] is seen today for follow up of her atrial fib and chronic diastilic CHF Wt is 225 lbs.  ( up 3 lbs )  Has chronic diastolic CHF Had a spinal injection last week..   Still has back and leg pain with walking  Has not been able to exercise.     Nov. 16, 2021 [name] is seen today for follow up of her chronic CHF, PAF  Wt today is 225 lbs.   Had an episode of PAF several weeks ago.   Lasted 2 days HR was 130s  Made her fatigued.  Has been walking ,  Had lost some weight, now has gained it back  Her Afib started while she was walking up her driveway  Is on xarelto   August 03, 2020: [name] is seen today for follow-up of her chronic congestive heart failure, paroxysmal atrial fibrillation.  Her weight today is211 lbs.   She was seen by [name], NP for worsening shortness of breath.  She has been on Tikosyn for her atrial fibrillation. Her Lasix was increased to 80 mg a day for 3 days then she resumed 40 mg p.o. twice daily.  She was last  seen in A. fib clinic in March, 2022.  She had been maintaining sinus rhythm at that time.  Is not able to do much due to severe DOE . Loses her breath with any activity .  Does an activity , then has to sit down Does another activity ,  then has to sit down  Walks the dog for a few minutes - perhaps 30 mintues.  Had CPX in 2018 ;-   Normal coronary CTA in Feb. 2020 Coronary calcium score is 0   She had a cardiopulmonary stress test in 2018.  It revealed normal functional capacity.  She also had pulmonary function test around 2018.  It revealed mildly reduced diffusion capacity  Voice is normal when she is rested,  has a very difficult time speaking when she is fatigued.  Normal TSH in June, 2022  Echo Jan. 2022:   EF 60-65%.,  Grade 2 diastolic dysfunction. Mild mitral regurgitation.  Saw Gillian Shieldsaitlin Walker , NP on last June.  Trial of acid blockers to see if that would help with her hoarse voice [facility]     Wt Readings from Last 3 Encounters:  08/03/20 211 lb 3.2 oz (95.8 kg)  07/14/20 213 lb (96.6 kg)  05/31/20 218 lb (98.9 kg)      Past Medical History:  Diagnosis Date   Atrial fibrillation (HCC)    Back pain    nerve problems-had series of three injections   Bone spur    Spine   Chronic anticoagulation    Diastolic congestive heart failure (HCC)    Hearing loss    History of shingles    Hyperlipemia    Migraine headache    Obstructive sleep apnea    uses CPAP   OSA on CPAP    Paroxysmal atrial fibrillation (HCC)    Restrictive cardiomyopathy (HCC)    Tremor    Tubal pregnancy     Past Surgical History:  Procedure Laterality Date   BREAST EXCISIONAL BIOPSY Left    benign   CARDIAC CATHETERIZATION     CARDIOVERSION     CHOLECYSTECTOMY     ECTOPIC PREGNANCY SURGERY  1980   EYE SURGERY     left eye injections x2-was having "stroke like" flashes of light   [facility]   HYSTEROSCOPY  6/05   and D&C-simple  hyperplasia secondary PMP bleeding   ROTATOR CUFF REPAIR     bilateral     Current Outpatient Medications  Medication Sig Dispense Refill   albuterol (VENTOLIN HFA) 108 (90 Base) MCG/ACT inhaler Inhale 2 puffs into the lungs every 6 (six) hours as needed for wheezing or shortness of breath. 18 g 12   atorvastatin (LIPITOR) 20 MG tablet TAKE 1 TABLET BY MOUTH DAILY AT 6PM 90 tablet 2   dofetilide (TIKOSYN) 125 MCG capsule TAKE 3 CAPSULES BY MOUTH  TWICE DAILY 540 capsule 3   ezetimibe (ZETIA) 10 MG tablet TAKE 1 TABLET EVERY DAY 90 tablet 3   furosemide (LASIX) 40 MG tablet Take 1 tablet (40 mg total) by mouth 2 (two) times daily. 180 tablet 2   gabapentin (NEURONTIN) 600 MG tablet Take 900 mg by mouth at bedtime.     LIDOCAINE-MENTHOL, SPRAY, EX Apply topically.     Misc Natural Products (ECHINACEA GOLDENSEAL PLUS PO) take during the winter months     Multiple Vitamins-Minerals (ICAPS AREDS 2) CAPS Take 1 capsule by mouth 2 (two) times daily.     pantoprazole (PROTONIX) 40 MG tablet Take one tablet twice daily for one week then take one tablet daily. 30 tablet 2   potassium chloride SA (KLOR-CON) 20 MEQ tablet TAKE 1 TABLET BY MOUTH  TWICE DAILY 180 tablet 3   propranolol (INDERAL) 10 MG tablet Take 1 tablet (  10 mg total) by mouth 4 (four) times daily as needed (for atrial fibrillation). 90 tablet 2   spironolactone (ALDACTONE) 25 MG tablet Take 1 tablet (25 mg total) by mouth daily. 90 tablet 2   XARELTO 20 MG TABS tablet TAKE 1 TABLET BY MOUTH  DAILY WITH SUPPER 90 tablet 1   No current facility-administered medications for this visit.    Allergies:   Topamax [topiramate], Codeine, Oxycodone, and Penicillins    Social History:  The patient  reports that she has never smoked. She has never used smokeless tobacco. She reports previous alcohol use. She reports that she does not use drugs.   Family History:  [name] family history includes Atrial fibrillation in her mother and  sister; Breast cancer in her sister; Cancer in her maternal grandmother and maternal uncle; Dementia in her father and sister; Heart disease in her sister; Hypertension in her father; Seizures in her father; Thyroid cancer in her sister.    ROS:   As noted in current hx. Otherwise negative    Physical Exam: Blood pressure 116/76, pulse (!) 51, height 5' 8.5" (1.74 m), weight 211 lb 3.2 oz (95.8 kg), last menstrual period 01/21/1990, SpO2 98 %.  GEN:  Well nourished, well developed in no acute distress HEENT: Normal NECK: No JVD; No carotid bruits LYMPHATICS: No lymphadenopathy CARDIAC: RRR , no murmurs, rubs, gallops RESPIRATORY:  Clear to auscultation without rales, wheezing or rhonchi  ABDOMEN: Soft, non-tender, non-distended MUSCULOSKELETAL:  No edema; No deformity  SKIN: Warm and dry NEUROLOGIC:  Alert and oriented x 3  EKG: August 03, 2020: Sinus bradycardia at 51.  No ST or T wave changes.  Recent Labs: 07/14/2020: NT-Pro BNP 237; TSH 1.020 08/03/2020: BUN 14; Creatinine, Ser 0.99; Magnesium 2.4; Potassium 4.2; Sodium 140    Lipid Panel    Component Value Date/Time   CHOL 134 07/23/2017 0847   TRIG 153 (H) 07/23/2017 0847   HDL 40 07/23/2017 0847   CHOLHDL 3.4 07/23/2017 0847   CHOLHDL 4 10/22/2013 1501   VLDL 32.6 10/22/2013 1501   LDLCALC 63 07/23/2017 0847      Wt Readings from Last 3 Encounters:  08/03/20 211 lb 3.2 oz (95.8 kg)  07/14/20 213 lb (96.6 kg)  05/31/20 218 lb (98.9 kg)      Other studies Reviewed: Additional studies/ records that were reviewed today include: . Review of the above records demonstrates:    ASSESSMENT AND PLAN:  1. Atrial fibrillation -she maintaining sinus rhythm.  Continue Tikosyn.  We will check basic metabolic profile and magnesium level today.    2. Hyperlipidemia-managed by Dr. Timothy Lasso.  Stable    3.  Chest discomfort:  :   4. Diastolic congestive heart failure -her diastolic heart failure seems to be well controlled.   She has normal left ventricular systolic function and grade 2 diastolic dysfunction.  Her echo is unchanged. .  5.  Mild TR :      6.  Shortness of breath: She has severe shortness of breath with any activity.  She also tends to lose her voice with any activity.   ? This may be a component of depression. ?? I am wondering if she has a neuromuscular disorder.  She sees a neurologist.  She will ask about any neuromuscular disorders,?  Myasthenia gravis or similar disorders.  I've  asked her to also discuss this with Dr. Timothy Lasso.  I do not think that her loss of voice with fatigue is due to  a cardiac etiology.   Current medicines are reviewed at length with the patient today.  The patient does not have concerns regarding medicines.  The following changes have been made:  See above.   Labs/ tests ordered today include:   Orders Placed This Encounter  Procedures   Basic Metabolic Panel (BMET)   Magnesium   EKG 12-Lead   Pulmonary function test     Disposition:   FU with me in 6  months for OV,    Kristeen Miss, MD  08/03/2020 8:38 PM    [facility] [address] [place], [place], [place]  [address] Phone: [phone]; Fax: [phone]

## 2020-08-03 ENCOUNTER — Encounter: Payer: Self-pay | Admitting: Cardiovascular Disease

## 2020-08-03 ENCOUNTER — Ambulatory Visit: Payer: Medicare PPO | Admitting: Cardiovascular Disease

## 2020-08-03 ENCOUNTER — Other Ambulatory Visit: Payer: Self-pay

## 2020-08-03 VITALS — BP 116/76 | HR 51 | Ht 68.5 in | Wt 211.2 lb

## 2020-08-03 DIAGNOSIS — R0600 Dyspnea, unspecified: Secondary | ICD-10-CM | POA: Diagnosis not present

## 2020-08-03 DIAGNOSIS — Z79899 Other long term (current) drug therapy: Secondary | ICD-10-CM

## 2020-08-03 DIAGNOSIS — Z7901 Long term (current) use of anticoagulants: Secondary | ICD-10-CM

## 2020-08-03 DIAGNOSIS — I480 Paroxysmal atrial fibrillation: Secondary | ICD-10-CM | POA: Diagnosis not present

## 2020-08-03 DIAGNOSIS — I5032 Chronic diastolic (congestive) heart failure: Secondary | ICD-10-CM

## 2020-08-03 LAB — BASIC METABOLIC PANEL WITH GFR
BUN/Creatinine Ratio: 14 (ref 12–28)
BUN: 14 mg/dL (ref 8–27)
CO2: 27 mmol/L (ref 20–29)
Calcium: 9.7 mg/dL (ref 8.7–10.3)
Chloride: 96 mmol/L (ref 96–106)
Creatinine, Ser: 0.99 mg/dL (ref 0.57–1.00)
Glucose: 99 mg/dL (ref 65–99)
Potassium: 4.2 mmol/L (ref 3.5–5.2)
Sodium: 140 mmol/L (ref 134–144)
eGFR: 61 mL/min/1.73

## 2020-08-03 LAB — MAGNESIUM: Magnesium: 2.4 mg/dL — ABNORMAL HIGH (ref 1.6–2.3)

## 2020-08-03 NOTE — Patient Instructions (Signed)
Medication Instructions:  Your physician recommends that you continue on your current medications as directed. Please refer to the Current Medication list given to you today.  *If you need a refill on your cardiac medications before your next appointment, please call your pharmacy*   Lab Work: TODAY - bmet, magnesium If you have labs (blood work) drawn today and your tests are completely normal, you will receive your results only by: MyChart Message (if you have MyChart) OR A paper copy in the mail If you have any lab test that is abnormal or we need to change your treatment, we will call you to review the results.   Testing/Procedures: Your physician has recommended that you have a pulmonary function test. Pulmonary Function Tests are a group of tests that measure how well air moves in and out of your lungs.   Follow-Up: At [facility], you and your health needs are our priority.  As part of our continuing mission to provide you with exceptional heart care, we have created designated Provider Care Teams.  These Care Teams include your primary Cardiologist (physician) and Advanced Practice Providers (APPs -  Physician Assistants and Nurse Practitioners) who all work together to provide you with the care you need, when you need it.   Your next appointment:   6 month(s)  The format for your next appointment:   In Person  Provider:   You may see Kristeen Miss, MD or one of the following Advanced Practice Providers on your designated Care Team:   Tereso Newcomer, PA-C [place], [place]

## 2020-08-04 ENCOUNTER — Telehealth: Payer: Self-pay | Admitting: Cardiovascular Disease

## 2020-08-04 ENCOUNTER — Ambulatory Visit (INDEPENDENT_AMBULATORY_CARE_PROVIDER_SITE_OTHER): Payer: Medicare PPO | Admitting: Internal Medicine

## 2020-08-04 DIAGNOSIS — I5032 Chronic diastolic (congestive) heart failure: Secondary | ICD-10-CM

## 2020-08-04 DIAGNOSIS — R0600 Dyspnea, unspecified: Secondary | ICD-10-CM

## 2020-08-04 DIAGNOSIS — I480 Paroxysmal atrial fibrillation: Secondary | ICD-10-CM

## 2020-08-04 LAB — PULMONARY FUNCTION TEST
DL/VA % pred: 111 %
DL/VA: 4.43 ml/min/mmHg/L
DLCO cor % pred: 101 %
DLCO cor: 23.99 ml/min/mmHg
DLCO unc % pred: 101 %
DLCO unc: 23.99 ml/min/mmHg
FEF 25-75 Post: 1.49 L/sec
FEF 25-75 Pre: 1.48 L/sec
FEF2575-%Change-Post: 0 %
FEF2575-%Pred-Post: 65 %
FEF2575-%Pred-Pre: 65 %
FEV1-%Change-Post: 0 %
FEV1-%Pred-Post: 74 %
FEV1-%Pred-Pre: 74 %
FEV1-Post: 2.15 L
FEV1-Pre: 2.14 L
FEV1FVC-%Change-Post: 3 %
FEV1FVC-%Pred-Pre: 95 %
FEV6-%Change-Post: -2 %
FEV6-%Pred-Post: 79 %
FEV6-%Pred-Pre: 81 %
FEV6-Post: 2.86 L
FEV6-Pre: 2.93 L
FEV6FVC-%Change-Post: 0 %
FEV6FVC-%Pred-Post: 104 %
FEV6FVC-%Pred-Pre: 103 %
FVC-%Change-Post: -2 %
FVC-%Pred-Post: 75 %
FVC-%Pred-Pre: 78 %
FVC-Post: 2.86 L
FVC-Pre: 2.94 L
Post FEV1/FVC ratio: 75 %
Post FEV6/FVC ratio: 100 %
Pre FEV1/FVC ratio: 73 %
Pre FEV6/FVC Ratio: 100 %
RV % pred: 64 %
RV: 1.6 L
TLC % pred: 81 %
TLC: 4.88 L

## 2020-08-04 NOTE — Telephone Encounter (Signed)
Patient returning call for lab results. 

## 2020-08-04 NOTE — Progress Notes (Signed)
PFT done today. 

## 2020-08-04 NOTE — Telephone Encounter (Signed)
See lab results ./cy 

## 2020-08-08 ENCOUNTER — Telehealth: Payer: Self-pay | Admitting: Cardiovascular Disease

## 2020-08-08 NOTE — Telephone Encounter (Signed)
[name] is calling requesting her results for her pulmonary function test. She states it shows her results, but not the standard range to show her where she is at on the scale of normal. Please advise.

## 2020-08-08 NOTE — Telephone Encounter (Signed)
Pt is aware MD has not reviewed PFT's as of yet. We will call her back with recommendations when MD has read over results.

## 2020-08-14 DIAGNOSIS — H9313 Tinnitus, bilateral: Secondary | ICD-10-CM | POA: Diagnosis not present

## 2020-08-14 DIAGNOSIS — H903 Sensorineural hearing loss, bilateral: Secondary | ICD-10-CM | POA: Diagnosis not present

## 2020-08-15 DIAGNOSIS — H40023 Open angle with borderline findings, high risk, bilateral: Secondary | ICD-10-CM | POA: Diagnosis not present

## 2020-08-15 DIAGNOSIS — H2513 Age-related nuclear cataract, bilateral: Secondary | ICD-10-CM | POA: Diagnosis not present

## 2020-08-15 DIAGNOSIS — H524 Presbyopia: Secondary | ICD-10-CM | POA: Diagnosis not present

## 2020-08-15 DIAGNOSIS — H35371 Puckering of macula, right eye: Secondary | ICD-10-CM | POA: Diagnosis not present

## 2020-08-18 ENCOUNTER — Other Ambulatory Visit: Payer: Self-pay | Admitting: Internal Medicine

## 2020-08-18 DIAGNOSIS — R6881 Early satiety: Secondary | ICD-10-CM

## 2020-08-18 DIAGNOSIS — J984 Other disorders of lung: Secondary | ICD-10-CM | POA: Diagnosis not present

## 2020-08-18 DIAGNOSIS — R0609 Other forms of dyspnea: Secondary | ICD-10-CM | POA: Diagnosis not present

## 2020-08-18 DIAGNOSIS — I2721 Secondary pulmonary arterial hypertension: Secondary | ICD-10-CM | POA: Diagnosis not present

## 2020-08-18 DIAGNOSIS — N1831 Chronic kidney disease, stage 3a: Secondary | ICD-10-CM | POA: Diagnosis not present

## 2020-08-18 DIAGNOSIS — E785 Hyperlipidemia, unspecified: Secondary | ICD-10-CM | POA: Diagnosis not present

## 2020-08-18 DIAGNOSIS — Z7901 Long term (current) use of anticoagulants: Secondary | ICD-10-CM | POA: Diagnosis not present

## 2020-08-18 DIAGNOSIS — F418 Other specified anxiety disorders: Secondary | ICD-10-CM | POA: Diagnosis not present

## 2020-08-18 DIAGNOSIS — I480 Paroxysmal atrial fibrillation: Secondary | ICD-10-CM | POA: Diagnosis not present

## 2020-08-18 DIAGNOSIS — D6869 Other thrombophilia: Secondary | ICD-10-CM | POA: Diagnosis not present

## 2020-08-20 ENCOUNTER — Other Ambulatory Visit: Payer: Self-pay | Admitting: Cardiovascular Disease

## 2020-08-21 DIAGNOSIS — H02055 Trichiasis without entropian left lower eyelid: Secondary | ICD-10-CM | POA: Diagnosis not present

## 2020-08-21 DIAGNOSIS — H0015 Chalazion left lower eyelid: Secondary | ICD-10-CM | POA: Diagnosis not present

## 2020-08-22 DIAGNOSIS — I10 Essential (primary) hypertension: Secondary | ICD-10-CM | POA: Diagnosis not present

## 2020-08-22 DIAGNOSIS — M4126 Other idiopathic scoliosis, lumbar region: Secondary | ICD-10-CM | POA: Diagnosis not present

## 2020-08-22 DIAGNOSIS — M461 Sacroiliitis, not elsewhere classified: Secondary | ICD-10-CM | POA: Diagnosis not present

## 2020-08-24 ENCOUNTER — Other Ambulatory Visit: Payer: Self-pay

## 2020-08-24 ENCOUNTER — Ambulatory Visit
Admission: RE | Admit: 2020-08-24 | Discharge: 2020-08-24 | Disposition: A | Discharge status: Home / Self Care | Payer: Medicare PPO | Source: Ambulatory Visit | Attending: Internal Medicine | Admitting: Internal Medicine

## 2020-08-24 DIAGNOSIS — R6881 Early satiety: Secondary | ICD-10-CM

## 2020-09-04 DIAGNOSIS — H35373 Puckering of macula, bilateral: Secondary | ICD-10-CM | POA: Diagnosis not present

## 2020-09-04 DIAGNOSIS — H3122 Choroidal dystrophy (central areolar) (generalized) (peripapillary): Secondary | ICD-10-CM | POA: Diagnosis not present

## 2020-09-04 DIAGNOSIS — H35453 Secondary pigmentary degeneration, bilateral: Secondary | ICD-10-CM | POA: Diagnosis not present

## 2020-09-04 DIAGNOSIS — H35363 Drusen (degenerative) of macula, bilateral: Secondary | ICD-10-CM | POA: Diagnosis not present

## 2020-09-04 DIAGNOSIS — H353131 Nonexudative age-related macular degeneration, bilateral, early dry stage: Secondary | ICD-10-CM | POA: Diagnosis not present

## 2020-09-04 DIAGNOSIS — H25813 Combined forms of age-related cataract, bilateral: Secondary | ICD-10-CM | POA: Diagnosis not present

## 2020-09-11 DIAGNOSIS — M461 Sacroiliitis, not elsewhere classified: Secondary | ICD-10-CM | POA: Diagnosis not present

## 2020-09-26 DIAGNOSIS — G43019 Migraine without aura, intractable, without status migrainosus: Secondary | ICD-10-CM | POA: Diagnosis not present

## 2020-09-26 DIAGNOSIS — G43719 Chronic migraine without aura, intractable, without status migrainosus: Secondary | ICD-10-CM | POA: Diagnosis not present

## 2020-09-26 DIAGNOSIS — E519 Thiamine deficiency, unspecified: Secondary | ICD-10-CM | POA: Diagnosis not present

## 2020-10-02 DIAGNOSIS — R001 Bradycardia, unspecified: Secondary | ICD-10-CM | POA: Diagnosis not present

## 2020-10-02 DIAGNOSIS — R0602 Shortness of breath: Secondary | ICD-10-CM | POA: Diagnosis not present

## 2020-10-02 DIAGNOSIS — R55 Syncope and collapse: Secondary | ICD-10-CM | POA: Diagnosis not present

## 2020-10-04 DIAGNOSIS — N1831 Chronic kidney disease, stage 3a: Secondary | ICD-10-CM | POA: Diagnosis not present

## 2020-10-04 DIAGNOSIS — M461 Sacroiliitis, not elsewhere classified: Secondary | ICD-10-CM | POA: Diagnosis not present

## 2020-10-04 DIAGNOSIS — R0609 Other forms of dyspnea: Secondary | ICD-10-CM | POA: Diagnosis not present

## 2020-10-04 DIAGNOSIS — E538 Deficiency of other specified B group vitamins: Secondary | ICD-10-CM | POA: Diagnosis not present

## 2020-10-04 DIAGNOSIS — D6869 Other thrombophilia: Secondary | ICD-10-CM | POA: Diagnosis not present

## 2020-10-04 DIAGNOSIS — E876 Hypokalemia: Secondary | ICD-10-CM | POA: Diagnosis not present

## 2020-10-04 DIAGNOSIS — I2721 Secondary pulmonary arterial hypertension: Secondary | ICD-10-CM | POA: Diagnosis not present

## 2020-10-04 DIAGNOSIS — I425 Other restrictive cardiomyopathy: Secondary | ICD-10-CM | POA: Diagnosis not present

## 2020-10-04 DIAGNOSIS — R5383 Other fatigue: Secondary | ICD-10-CM | POA: Diagnosis not present

## 2020-10-04 DIAGNOSIS — R634 Abnormal weight loss: Secondary | ICD-10-CM | POA: Diagnosis not present

## 2020-10-04 DIAGNOSIS — I480 Paroxysmal atrial fibrillation: Secondary | ICD-10-CM | POA: Diagnosis not present

## 2020-10-05 ENCOUNTER — Ambulatory Visit
Admission: RE | Admit: 2020-10-05 | Discharge: 2020-10-05 | Disposition: A | Discharge status: Home / Self Care | Payer: Medicare PPO | Source: Ambulatory Visit | Attending: Internal Medicine | Admitting: Internal Medicine

## 2020-10-05 ENCOUNTER — Other Ambulatory Visit: Payer: Self-pay | Admitting: Internal Medicine

## 2020-10-05 DIAGNOSIS — R0600 Dyspnea, unspecified: Secondary | ICD-10-CM

## 2020-10-05 DIAGNOSIS — R0609 Other forms of dyspnea: Secondary | ICD-10-CM

## 2020-10-05 DIAGNOSIS — K439 Ventral hernia without obstruction or gangrene: Secondary | ICD-10-CM | POA: Diagnosis not present

## 2020-10-05 DIAGNOSIS — R634 Abnormal weight loss: Secondary | ICD-10-CM

## 2020-10-05 DIAGNOSIS — M4186 Other forms of scoliosis, lumbar region: Secondary | ICD-10-CM | POA: Diagnosis not present

## 2020-10-05 DIAGNOSIS — J9811 Atelectasis: Secondary | ICD-10-CM | POA: Diagnosis not present

## 2020-10-05 DIAGNOSIS — K429 Umbilical hernia without obstruction or gangrene: Secondary | ICD-10-CM | POA: Diagnosis not present

## 2020-10-05 MED ORDER — IOPAMIDOL (ISOVUE-370) INJECTION 76%
80.0000 mL | Freq: Once | INTRAVENOUS | Status: AC | PRN
  Administered 2020-10-05: 80 mL via INTRAVENOUS

## 2020-10-09 NOTE — Progress Notes (Signed)
Cardiology Office Note    Date:  10/10/2020   ID:  [name], DOB 07-21-1950, MRN [id number]   PCP:  Shon Baton, [facility]  Cardiologist:  Mertie Moores, MD   Advanced Practice Provider:  No care team member to display Electrophysiologist:  None   [phone]   Chief Complaint  Patient presents with   Follow-up     History of Present Illness:  [name] is a 70 y.o. female with history of PAF on Tikosyn and Pradaxa, HLD, palpitations, normal cath 2008, heart biopsy at [facility] reportedly normal..  Chronic diastolic CHF, OSA, normal coronary CTA 02/2018 calcium score 0.  Echo 01/2020 LVEF 60 to 65% with grade 2 DD.  Patient last saw Dr. Acie Fredrickson 08/03/2020 at which time she was complaining of severe shortness of breath with any activity and loses her voice with any activity.  He thought there may be component of neuromuscular disorder and asked her to follow-up with neurologist and Dr. Virgina Jock.  Patient was seen in the ER at [facility] 10/02/2020 after a near syncopal episode while in the preop area with her husband who was having surgery that day.  She had not eaten anything.  Potassium was 5.4 glucose 110 other labs stable.  No EKG changes per ER note-sinus bradycardia 53/m.  He ordered PFTs that showed mild obstructive lung disease. She says she felt like she was in Afib and she checked her watch and it says she was in Afib. She became diaphoretic and passed out. She thinks HR was in the low 100's but normal HR in 50's. Husband says her HR got down to 39 at one point.  She left ER after 13 hrs of not being soon. She's feeling better now. She still has loss of voice when she talks or gets short of breath. Chronic DOE. Has lost 30 lbs without trying. Had CT abdomen that was ok, upper GI ok. Has had Afib at least 3 times since here last and can last for a couple days.   Past Medical History:  Diagnosis Date   Atrial fibrillation (HCC)    Back  pain    nerve problems-had series of three injections   Bone spur    Spine   Chronic anticoagulation    Diastolic congestive heart failure (HCC)    Hearing loss    History of shingles    Hyperlipemia    Migraine headache    Obstructive sleep apnea    uses CPAP   OSA on CPAP    Paroxysmal atrial fibrillation (HCC)    Restrictive cardiomyopathy ([place])    Tremor    Tubal pregnancy     Past Surgical History:  Procedure Laterality Date   BREAST EXCISIONAL BIOPSY Left    benign   CARDIAC CATHETERIZATION     CARDIOVERSION     CHOLECYSTECTOMY     ECTOPIC PREGNANCY SURGERY  1980   EYE SURGERY     left eye injections x2-was having "stroke like" flashes of light   heart biopsy  2008   [facility]   HYSTEROSCOPY  6/05   and D&C-simple hyperplasia secondary PMP bleeding   ROTATOR CUFF REPAIR     bilateral    Current Medications: Current Meds  Medication Sig   albuterol (VENTOLIN HFA) 108 (90 Base) MCG/ACT inhaler Inhale 2 puffs into the lungs every 6 (six) hours as needed for wheezing or shortness of breath.   atorvastatin (LIPITOR) 20 MG tablet  TAKE 1 TABLET BY MOUTH DAILY AT 6PM   dofetilide (TIKOSYN) 125 MCG capsule TAKE 3 CAPSULES BY MOUTH  TWICE DAILY   ezetimibe (ZETIA) 10 MG tablet TAKE 1 TABLET EVERY DAY   gabapentin (NEURONTIN) 600 MG tablet Take 900 mg by mouth at bedtime.   LIDOCAINE-MENTHOL, SPRAY, EX Apply topically.   Misc Natural Products (ECHINACEA GOLDENSEAL PLUS PO) take during the winter months   Multiple Vitamins-Minerals (ICAPS AREDS 2) CAPS Take 1 capsule by mouth 2 (two) times daily.   propranolol (INDERAL) 10 MG tablet Take 10 mg by mouth 2 (two) times daily.   spironolactone (ALDACTONE) 25 MG tablet Take 1 tablet (25 mg total) by mouth daily.   XARELTO 20 MG TABS tablet TAKE 1 TABLET BY MOUTH  DAILY WITH SUPPER   [DISCONTINUED] furosemide (LASIX) 40 MG tablet Take 1 tablet (40 mg total) by mouth 2 (two) times daily.   [DISCONTINUED] potassium chloride SA  (KLOR-CON) 20 MEQ tablet TAKE 1 TABLET BY MOUTH  TWICE DAILY   [DISCONTINUED] propranolol (INDERAL) 10 MG tablet TAKE 1 TABLET FOUR TIMES DAILY AS NEEDED (FOR ATRIAL FIBRILLATION)     Allergies:   Topamax [topiramate], Codeine, Oxycodone, Tramadol, and Penicillins   Social History   Socioeconomic History   Marital status: Married    Spouse name: [name]   Number of children: 0   Years of education: college   Highest education level: Not on file  Occupational History    Comment: retired  Tobacco Use   Smoking status: Never   Smokeless tobacco: Never  Vaping Use   Vaping Use: Never used  Substance and Sexual Activity   Alcohol use: Not Currently   Drug use: No   Sexual activity: Not Currently    Birth control/protection: Post-menopausal  Other Topics Concern   Not on file  Social History Narrative   Patient lives in [place] with her husband Jenny Reichmann)  and she is retired. College education.   Caffeine- 2-3 cups tea daily .   Social Determinants of Health   Financial Resource Strain: Not on file  Food Insecurity: Not on file  Transportation Needs: Not on file  Physical Activity: Not on file  Stress: Not on file  Social Connections: Not on file     Family History:  [name]  family history includes Atrial fibrillation in her mother and sister; Breast cancer in her sister; Cancer in her maternal grandmother and maternal uncle; Dementia in her father and sister; Heart disease in her sister; Hypertension in her father; Seizures in her father; Thyroid cancer in her sister.   ROS:   Please see the history of present illness.    ROS All other systems reviewed and are negative.   PHYSICAL EXAM:   VS:  BP 112/78   Pulse (!) 49   Ht 5' 8.5" (1.74 m)   Wt 202 lb 9.6 oz (91.9 kg)   LMP 01/21/1990   SpO2 98%   BMI 30.36 kg/m   Physical Exam  GEN: Well nourished, well developed, in no acute distress  Neck: no JVD, carotid bruits, or masses Cardiac:RRR; no murmurs, rubs,  or gallops  Respiratory:  clear to auscultation bilaterally, normal work of breathing GI: soft, nontender, nondistended, + BS Ext: without cyanosis, clubbing, or edema, Good distal pulses bilaterally Neuro:  Alert and Oriented x 3, Psych: euthymic mood, full affect  Wt Readings from Last 3 Encounters:  10/10/20 202 lb 9.6 oz (91.9 kg)  08/03/20 211 lb 3.2 oz (95.8 kg)  07/14/20 213 lb (96.6 kg)      Studies/Labs Reviewed:   EKG:  EKG is not ordered today.     Recent Labs: 07/14/2020: NT-Pro BNP 237; TSH 1.020 08/03/2020: BUN 14; Creatinine, Ser 0.99; Magnesium 2.4; Potassium 4.2; Sodium 140   Lipid Panel    Component Value Date/Time   CHOL 134 07/23/2017 0847   TRIG 153 (H) 07/23/2017 0847   HDL 40 07/23/2017 0847   CHOLHDL 3.4 07/23/2017 0847   CHOLHDL 4 10/22/2013 1501   VLDL 32.6 10/22/2013 1501   LDLCALC 63 07/23/2017 0847    Additional studies/ records that were reviewed today include:  Echo 01/2020 IMPRESSIONS     1. Left ventricular ejection fraction, by estimation, is 60 to 65%. The  left ventricle has normal function. The left ventricle has no regional  wall motion abnormalities. Left ventricular diastolic parameters are  consistent with Grade II diastolic  dysfunction (pseudonormalization).   2. Right ventricular systolic function is normal. The right ventricular  size is normal. There is normal pulmonary artery systolic pressure.   3. The mitral valve is normal in structure. Mild mitral valve  regurgitation. No evidence of mitral stenosis.   4. The aortic valve is normal in structure. Aortic valve regurgitation is  not visualized. No aortic stenosis is present.   5. The inferior vena cava is normal in size with greater than 50%  respiratory variability, suggesting right atrial pressure of 3 mmHg.    Risk Assessment/Calculations:    CHA2DS2-VASc Score = 3   This indicates a 3.2% annual risk of stroke. [name] score is based upon: CHF History:  1 HTN History: 0 Diabetes History: 0 Stroke History: 0 Vascular Disease History: 0 Age Score: 1 Gender Score: 1       ASSESSMENT:    1. Syncope, unspecified syncope type   2. Paroxysmal atrial fibrillation (HCC)   3. Chronic diastolic CHF (congestive heart failure) ([place])   4. Hyperlipidemia, unspecified hyperlipidemia type      PLAN:  In order of problems listed above:  Syncope while waiting for her husband to have surgery.  She was sitting down and felt like she was in A. fib and checked her apple watch that indicated A. fib in the low 100s.  She then became very diaphoretic and passed out.  She was in the ER for 13 hours at [facility] but was never seen.  Blood work was all stable except potassium was a little high.  Glucose was 110.  She had not eaten that day.  EKG shows sinus bradycardia at 53 bpm which is her baseline.  She has had atrial fibrillation about 3 times since she was last here in July.  Sometimes it last for days.  We will check Holter monitor.  Decrease Lasix to 40 mg once daily potassium 20 mill equivalents once daily.  Follow-up be met today.  PAF on Tikosyn and Pradaxa having a lot of breakthrough A. fib recently.  Will place 2-week monitor and if indicated refer to A. fib clinic  Chronic diastolic CHF normal LVEF 60 to 65% and grade 2 DD on echo 01/2020 she has no trouble with swelling.  Her shortness of breath has not improved with extra diuretics.  We will reduce Lasix to 40 mg once daily potassium to once daily.  If she has swelling or worsening shortness of breath she can go back up on this.  HLD on Lipitor and Zetia      Shared Decision Making/Informed Consent  Medication Adjustments/Labs and Tests Ordered: Current medicines are reviewed at length with the patient today.  Concerns regarding medicines are outlined above.  Medication changes, Labs and Tests ordered today are listed in the Patient Instructions below. Patient Instructions   Medication Instructions:  1) Decrease Lasix to 1 tablet daily   2) Decrease Potassium to 1 tablet daily   *If you need a refill on your cardiac medications before your next appointment, please call your pharmacy*   Lab Work: Bmp- today   If you have labs (blood work) drawn today and your tests are completely normal, you will receive your results only by: [place] (if you have MyChart) OR A paper copy in the mail If you have any lab test that is abnormal or we need to change your treatment, we will call you to review the results.   Testing/Procedures: A zio monitor was ordered today. It will remain on for 14 days. You will then return monitor and event diary in provided box. It takes 1-2 weeks for report to be downloaded and returned to [place]. We will call you with the results. If monitor falls off or has orange flashing light, please call Zio for further instructions.     Follow-Up: At [facility], you and your health needs are our priority.  As part of our continuing mission to provide you with exceptional heart care, we have created designated Provider Care Teams.  These Care Teams include your primary Cardiologist (physician) and Advanced Practice Providers (APPs -  Physician Assistants and Nurse Practitioners) who all work together to provide you with the care you need, when you need it.  We recommend signing up for the patient portal called "MyChart".  Sign up information is provided on this After Visit Summary.  MyChart is used to connect with patients for Virtual Visits (Telemedicine).  Patients are able to view lab/test results, encounter notes, upcoming appointments, etc.  Non-urgent messages can be sent to your provider as well.   To learn more about what you can do with MyChart, go to [email or web address].    Your next appointment:   6 month(s)  The format for your next appointment:   In Person  Provider:   You may see Mertie Moores, MD or one of the  following Advanced Practice Providers on your designated Care Team:   Richardson Dopp, PA-C Vin Bhagat, [place]   Other Instructions [place] Monitor Instructions  Your physician has requested you wear a ZIO patch monitor for 14 days.  This is a single patch monitor. [employer] supplies one patch monitor per enrollment. Additional stickers are not available. Please do not apply patch if you will be having a Nuclear Stress Test,  Echocardiogram, Cardiac CT, MRI, or Chest Xray during the period you would be wearing the  monitor. The patch cannot be worn during these tests. You cannot remove and re-apply the  ZIO XT patch monitor.  Your ZIO patch monitor will be mailed 3 day USPS to your address on file. It may take 3-5 days  to receive your monitor after you have been enrolled.  Once you have received your monitor, please review the enclosed instructions. Your monitor  has already been registered assigning a specific monitor serial # to you.  Billing and Patient Assistance Program Information  We have supplied [employer] with any of your insurance information on file for billing purposes. [employer] offers a sliding scale Patient Assistance Program for patients that do not have  insurance, or whose  insurance does not completely cover the cost of the ZIO monitor.  You must apply for the Patient Assistance Program to qualify for this discounted rate.  To apply, please call [employer] at [phone], select option 4, select option 2, ask to apply for  Patient Assistance Program. Theodore Demark will ask your household income, and how many people  are in your household. They will quote your out-of-pocket cost based on that information.  [employer] will also be able to set up a 8-month interest-free payment plan if needed.  Applying the monitor   Shave hair from upper left chest.  Hold abrader disc by orange tab. Rub abrader in 40 strokes over the upper left chest as  indicated in your monitor  instructions.  Clean area with 4 enclosed alcohol pads. Let dry.  Apply patch as indicated in monitor instructions. Patch will be placed under collarbone on left  side of chest with arrow pointing upward.  Rub patch adhesive wings for 2 minutes. Remove white label marked "1". Remove the white  label marked "2". Rub patch adhesive wings for 2 additional minutes.  While looking in a mirror, press and release button in center of patch. A small green light will  flash 3-4 times. This will be your only indicator that the monitor has been turned on.  Do not shower for the first 24 hours. You may shower after the first 24 hours.  Press the button if you feel a symptom. You will hear a small click. Record Date, Time and  Symptom in the Patient Logbook.  When you are ready to remove the patch, follow instructions on the last 2 pages of Patient  Logbook. Stick patch monitor onto the last page of Patient Logbook.  Place Patient Logbook in the blue and white box. Use locking tab on box and tape box closed  securely. The blue and white box has prepaid postage on it. Please place it in the mailbox as  soon as possible. Your physician should have your test results approximately 7 days after the  monitor has been mailed back to [facility]  Call [place]at [phone] you have questions regarding  your ZIO XT patch monitor. Call them immediately if you see an orange light blinking on your  monitor.  If your monitor falls off in less than 4 days, contact our Monitor department at [phone]  If your monitor becomes loose or falls off after 4 days call [employer] at [phone]  suggestions on securing your monitor    Signed, MErmalinda Barrios PA-C  10/10/2020 11:15 AM    [place] [place]  [address]Phone: [phone] Fax: [phone]

## 2020-10-10 ENCOUNTER — Encounter: Payer: Self-pay | Admitting: Physician Assistant

## 2020-10-10 ENCOUNTER — Ambulatory Visit: Payer: Medicare PPO | Admitting: Primary Care

## 2020-10-10 ENCOUNTER — Ambulatory Visit: Payer: Medicare PPO | Admitting: Physician Assistant

## 2020-10-10 ENCOUNTER — Ambulatory Visit (INDEPENDENT_AMBULATORY_CARE_PROVIDER_SITE_OTHER): Payer: Medicare PPO

## 2020-10-10 ENCOUNTER — Other Ambulatory Visit: Payer: Self-pay

## 2020-10-10 VITALS — BP 112/78 | HR 49 | Ht 68.5 in | Wt 202.6 lb

## 2020-10-10 DIAGNOSIS — I480 Paroxysmal atrial fibrillation: Secondary | ICD-10-CM

## 2020-10-10 DIAGNOSIS — I5032 Chronic diastolic (congestive) heart failure: Secondary | ICD-10-CM

## 2020-10-10 DIAGNOSIS — E538 Deficiency of other specified B group vitamins: Secondary | ICD-10-CM | POA: Diagnosis not present

## 2020-10-10 DIAGNOSIS — R55 Syncope and collapse: Secondary | ICD-10-CM

## 2020-10-10 DIAGNOSIS — R002 Palpitations: Secondary | ICD-10-CM

## 2020-10-10 DIAGNOSIS — E785 Hyperlipidemia, unspecified: Secondary | ICD-10-CM | POA: Diagnosis not present

## 2020-10-10 LAB — BASIC METABOLIC PANEL
BUN/Creatinine Ratio: 18 (ref 12–28)
BUN: 16 mg/dL (ref 8–27)
CO2: 26 mmol/L (ref 20–29)
Calcium: 9.7 mg/dL (ref 8.7–10.3)
Chloride: 98 mmol/L (ref 96–106)
Creatinine, Ser: 0.91 mg/dL (ref 0.57–1.00)
Glucose: 91 mg/dL (ref 65–99)
Potassium: 4.4 mmol/L (ref 3.5–5.2)
Sodium: 141 mmol/L (ref 134–144)
eGFR: 68 mL/min/{1.73_m2} (ref >59)

## 2020-10-10 MED ORDER — POTASSIUM CHLORIDE CRYS ER 20 MEQ PO TBCR: 20.0000 meq | EXTENDED_RELEASE_TABLET | Freq: Every day | ORAL | 3 refills | Status: DC

## 2020-10-10 MED ORDER — FUROSEMIDE 40 MG PO TABS: 40.0000 mg | ORAL_TABLET | Freq: Every day | ORAL | 3 refills | Status: DC

## 2020-10-10 NOTE — Progress Notes (Unsigned)
Patient enrolled for [employer] to mail a 14 day ZIO XT monitor to her address on file. 

## 2020-10-10 NOTE — Progress Notes (Deleted)
@Patient  ID: , female    DOB: 1950/12/29, 70 y.o.   MRN: [id number]  No chief complaint on file.   Referring provider: [id number], MD  HPI: 70 year old female, never smoker.  Past history significant for OSA, A. fib, cardiomyopathy, chronic diastolic heart failure, essential tremor, chronic kidney disease stage III, obesity.  Patient admitted with Dr. 66 patient last seen in office on 08/11/19.   10/10/2020- Interim hx  Patient presents today for annual follow-up for OSA/ shortness of breath. Patient saw cariology today. Reviewed their note. She saw Dr. 10/12/2020 in July 2022 with complaints of severe shortness of breath, possible neuromuscular disorder and advised to follow-up with neurologist and Dr. August 2022. She was seen in ED at [facility] on 10/02/20 after near syncopal episode while in pre-op area while waiting for her husband who was having surgery that day. EKG showed no changed, noted Sinus bradycardia. She goes in and out of aib which can last a couple of days. PFTs in July 2022 showed mild obstructive lung disease, normal diffusion capacity. Echocardiogram in January showed normal LV ejection fraction, grade 2 diastolic dysfunction and normal pulmonary artery systolic pressure. She loses her voice when talking or gets short of breath. She has lost 30 lbs without trying. She has had a normal CT chest/abdomen/pelvis on 10/05/20, no suspicions nodules, minimal atelectasis. Cardiology ordered 14 day ZIO patch. Lasix has been decreased to 1 tablet daily along with her potassium.     Allergies  Allergen Reactions   Topamax [Topiramate] Other (See Comments)    hallucinations    Codeine Nausea And Vomiting    VERTIGO   Oxycodone     Hives    Tramadol    Penicillins Rash    Immunization History  Administered Date(s) Administered   DTaP, 5 pertussis antigens 07/07/2014   Influenza Split 11/05/2010, 11/05/2011, 10/26/2012, 11/04/2013, 10/22/2015, 10/03/2017   Influenza,  Quadrivalent, Recombinant, Inj, Pf 10/18/2017, 10/03/2018, 10/23/2019   Influenza-Unspecified 11/04/2015, 10/08/2016, 09/15/2018   PFIZER(Purple Top)SARS-COV-2 Vaccination 02/28/2019, 03/24/2019, 09/08/2019   Pneumococcal Conjugate-13 12/22/2015   Pneumococcal Polysaccharide-23 11/10/2008, 01/22/2015   Td 07/26/2003, 06/22/2013, 01/04/2014, 01/04/2014   Zoster, Live 11/29/2010    Past Medical History:  Diagnosis Date   Atrial fibrillation (HCC)    Back pain    nerve problems-had series of three injections   Bone spur    Spine   Chronic anticoagulation    Diastolic congestive heart failure (HCC)    Hearing loss    History of shingles    Hyperlipemia    Migraine headache    Obstructive sleep apnea    uses CPAP   OSA on CPAP    Paroxysmal atrial fibrillation (HCC)    Restrictive cardiomyopathy (HCC)    Tremor    Tubal pregnancy     Tobacco History: Social History   Tobacco Use  Smoking Status Never  Smokeless Tobacco Never   Counseling given: Not Answered   Outpatient Medications Prior to Visit  Medication Sig Dispense Refill   albuterol (VENTOLIN HFA) 108 (90 Base) MCG/ACT inhaler Inhale 2 puffs into the lungs every 6 (six) hours as needed for wheezing or shortness of breath. 18 g 12   atorvastatin (LIPITOR) 20 MG tablet TAKE 1 TABLET BY MOUTH DAILY AT 6PM 90 tablet 2   dofetilide (TIKOSYN) 125 MCG capsule TAKE 3 CAPSULES BY MOUTH  TWICE DAILY 540 capsule 3   ezetimibe (ZETIA) 10 MG tablet TAKE 1 TABLET EVERY DAY 90 tablet 3   furosemide (  LASIX) 40 MG tablet Take 1 tablet (40 mg total) by mouth daily. 90 tablet 3   gabapentin (NEURONTIN) 600 MG tablet Take 900 mg by mouth at bedtime.     LIDOCAINE-MENTHOL, SPRAY, EX Apply topically.     Misc Natural Products (ECHINACEA GOLDENSEAL PLUS PO) take during the winter months     Multiple Vitamins-Minerals (ICAPS AREDS 2) CAPS Take 1 capsule by mouth 2 (two) times daily.     potassium chloride SA (KLOR-CON) 20 MEQ tablet  Take 1 tablet (20 mEq total) by mouth daily. 90 tablet 3   propranolol (INDERAL) 10 MG tablet Take 10 mg by mouth 2 (two) times daily.     spironolactone (ALDACTONE) 25 MG tablet Take 1 tablet (25 mg total) by mouth daily. 90 tablet 2   XARELTO 20 MG TABS tablet TAKE 1 TABLET BY MOUTH  DAILY WITH SUPPER 90 tablet 1   No facility-administered medications prior to visit.      Review of Systems  Review of Systems   Physical Exam  LMP 01/21/1990  Physical Exam   Lab Results:  CBC    Component Value Date/Time   WBC 8.1 07/10/2016 0854   WBC 9.4 09/30/2013 1655   RBC 4.77 07/10/2016 0854   RBC 4.41 09/30/2013 1655   HGB 13.8 07/10/2016 0854   HCT 40.1 07/10/2016 0854   PLT 273 07/10/2016 0854   MCV 84 07/10/2016 0854   MCH 28.9 07/10/2016 0854   MCH 27.0 09/30/2013 1655   MCHC 34.4 07/10/2016 0854   MCHC 32.2 09/30/2013 1655   RDW 14.3 07/10/2016 0854   LYMPHSABS 1.8 07/10/2016 0854   MONOABS 0.5 03/12/2007 1735   EOSABS 0.1 07/10/2016 0854   BASOSABS 0.0 07/10/2016 0854    BMET    Component Value Date/Time   NA 140 08/03/2020 1047   K 4.2 08/03/2020 1047   CL 96 08/03/2020 1047   CO2 27 08/03/2020 1047   GLUCOSE 99 08/03/2020 1047   GLUCOSE 101 (H) 04/03/2020 1000   BUN 14 08/03/2020 1047   CREATININE 0.99 08/03/2020 1047   CREATININE 0.95 01/01/2016 0839   CALCIUM 9.7 08/03/2020 1047   GFRNONAA >60 04/03/2020 1000   GFRAA 76 12/07/2019 0839    BNP No results found for: BNP  ProBNP    Component Value Date/Time   PROBNP 237 07/14/2020 0925   PROBNP 117.8 09/30/2013 1642    Imaging: CT CHEST ABDOMEN PELVIS W CONTRAST  Result Date: 10/05/2020 CLINICAL DATA:  Dyspnea on exertion and weight loss EXAM: CT CHEST, ABDOMEN, AND PELVIS WITH CONTRAST TECHNIQUE: Multidetector CT imaging of the chest, abdomen and pelvis was performed following the standard protocol during bolus administration of intravenous contrast. CONTRAST:  61mL ISOVUE-370 IOPAMIDOL  (ISOVUE-370) INJECTION 76% COMPARISON:  Chest CT 09/30/2013 FINDINGS: CT CHEST FINDINGS Cardiovascular: Normal heart size. No pericardial effusion. No significant vascular finding. Mediastinum/Nodes: Negative for adenopathy or mass. No chest wall mass. Lungs/Pleura: There is no edema, consolidation, effusion, or pneumothorax. No suspicious nodules. Minimal atelectasis. Musculoskeletal: No acute or aggressive finding. Spondylosis and mild scoliosis. CT ABDOMEN PELVIS FINDINGS Hepatobiliary: No focal liver abnormality.Cholecystectomy. Prominence of common bile duct diameter is likely reservoir effect. Pancreas: Unremarkable. Spleen: Unremarkable. Adrenals/Urinary Tract: Negative adrenals. No hydronephrosis or stone. Unremarkable bladder. Stomach/Bowel:  No obstruction. No appendicitis. Vascular/Lymphatic: No acute vascular abnormality. No mass or adenopathy. Reproductive:No pathologic findings. Other: No ascites or pneumoperitoneum. Fatty midline hernia with wide neck. Musculoskeletal: No acute abnormalities. Lumbar degeneration and scoliosis. IMPRESSION: 1. Benign CT  of the body. No evidence of mass or inflammation. 2. Fatty umbilical hernia. Electronically Signed   By: Tiburcio Pea M.D.   On: 10/05/2020 11:39     Assessment & Plan:   No problem-specific Assessment & Plan notes found for this encounter.     Glenford Bayley, NP 10/10/2020

## 2020-10-10 NOTE — Patient Instructions (Addendum)
Medication Instructions:  1) Decrease Lasix to 1 tablet daily   2) Decrease Potassium to 1 tablet daily   *If you need a refill on your cardiac medications before your next appointment, please call your pharmacy*   Lab Work: Bmp- today   If you have labs (blood work) drawn today and your tests are completely normal, you will receive your results only by: MyChart Message (if you have MyChart) OR A paper copy in the mail If you have any lab test that is abnormal or we need to change your treatment, we will call you to review the results.   Testing/Procedures: A zio monitor was ordered today. It will remain on for 14 days. You will then return monitor and event diary in provided box. It takes 1-2 weeks for report to be downloaded and returned to [place]. We will call you with the results. If monitor falls off or has orange flashing light, please call Zio for further instructions.     Follow-Up: At [facility], you and your health needs are our priority.  As part of our continuing mission to provide you with exceptional heart care, we have created designated Provider Care Teams.  These Care Teams include your primary Cardiologist (physician) and Advanced Practice Providers (APPs -  Physician Assistants and Nurse Practitioners) who all work together to provide you with the care you need, when you need it.  We recommend signing up for the patient portal called "MyChart".  Sign up information is provided on this After Visit Summary.  MyChart is used to connect with patients for Virtual Visits (Telemedicine).  Patients are able to view lab/test results, encounter notes, upcoming appointments, etc.  Non-urgent messages can be sent to your provider as well.   To learn more about what you can do with MyChart, go to [email or web address].    Your next appointment:   6 month(s)  The format for your next appointment:   In Person  Provider:   You may see Kristeen Miss, MD or one of the following  Advanced Practice Providers on your designated Care Team:   Tereso Newcomer, PA-C Vin Bhagat, [place]   Other Instructions [name]- Long Term Monitor Instructions  Your physician has requested you wear a ZIO patch monitor for 14 days.  This is a single patch monitor. [employer] supplies one patch monitor per enrollment. Additional stickers are not available. Please do not apply patch if you will be having a Nuclear Stress Test,  Echocardiogram, Cardiac CT, MRI, or Chest Xray during the period you would be wearing the  monitor. The patch cannot be worn during these tests. You cannot remove and re-apply the  ZIO XT patch monitor.  Your ZIO patch monitor will be mailed 3 day USPS to your address on file. It may take 3-5 days  to receive your monitor after you have been enrolled.  Once you have received your monitor, please review the enclosed instructions. Your monitor  has already been registered assigning a specific monitor serial # to you.  Billing and Patient Assistance Program Information  We have supplied [employer] with any of your insurance information on file for billing purposes. [employer] offers a sliding scale Patient Assistance Program for patients that do not have  insurance, or whose insurance does not completely cover the cost of the ZIO monitor.  You must apply for the Patient Assistance Program to qualify for this discounted rate.  To apply, please call [employer] at [phone], select option 4, select option 2, ask  to apply for  Patient Assistance Program. [name] will ask your household income, and how many people  are in your household. They will quote your out-of-pocket cost based on that information.  [employer] will also be able to set up a 85-month, interest-free payment plan if needed.  Applying the monitor   Shave hair from upper left chest.  Hold abrader disc by orange tab. Rub abrader in 40 strokes over the upper left chest as  indicated in your monitor instructions.  Clean  area with 4 enclosed alcohol pads. Let dry.  Apply patch as indicated in monitor instructions. Patch will be placed under collarbone on left  side of chest with arrow pointing upward.  Rub patch adhesive wings for 2 minutes. Remove white label marked "1". Remove the white  label marked "2". Rub patch adhesive wings for 2 additional minutes.  While looking in a mirror, press and release button in center of patch. A small green light will  flash 3-4 times. This will be your only indicator that the monitor has been turned on.  Do not shower for the first 24 hours. You may shower after the first 24 hours.  Press the button if you feel a symptom. You will hear a small click. Record Date, Time and  Symptom in the Patient Logbook.  When you are ready to remove the patch, follow instructions on the last 2 pages of Patient  Logbook. Stick patch monitor onto the last page of Patient Logbook.  Place Patient Logbook in the blue and white box. Use locking tab on box and tape box closed  securely. The blue and white box has prepaid postage on it. Please place it in the mailbox as  soon as possible. Your physician should have your test results approximately 7 days after the  monitor has been mailed back to [facility].  Call [facility] Customer Care at [phone] if you have questions regarding  your ZIO XT patch monitor. Call them immediately if you see an orange light blinking on your  monitor.  If your monitor falls off in less than 4 days, contact our Monitor department at [phone].  If your monitor becomes loose or falls off after 4 days call [employer] at [phone] for  suggestions on securing your monitor

## 2020-10-11 ENCOUNTER — Ambulatory Visit: Payer: Medicare PPO | Admitting: Primary Care

## 2020-10-11 ENCOUNTER — Encounter: Payer: Self-pay | Admitting: Primary Care

## 2020-10-11 VITALS — BP 120/70 | HR 54 | Temp 98.2°F | Ht 69.0 in | Wt 204.2 lb

## 2020-10-11 DIAGNOSIS — G4733 Obstructive sleep apnea (adult) (pediatric): Secondary | ICD-10-CM | POA: Diagnosis not present

## 2020-10-11 DIAGNOSIS — R0600 Dyspnea, unspecified: Secondary | ICD-10-CM

## 2020-10-11 NOTE — Patient Instructions (Addendum)
Recommendations: - Follow-up with neurology, if exam is normal would have you return to to see either Dr. Jenne Pane or Annalee Genta with ENT to assess vocal cord  - We will place an order to change DME companies to [facility]- there may be a little bit of a wait to get new CAP machine as there is a national backlog on resmed's, we ask that you be patient and follow-up with your company   Orders: - Please place order for new CPAP @ auto titrate 5-15cm h20 and mask fitting with [facility]. Original sleep study was in 2018  Follow-up: - 3 months with Dr. Vassie Loll or sooner if needed

## 2020-10-11 NOTE — Progress Notes (Signed)
@Patient  ID: , female    DOB: 1950-02-04, 70 y.o.   MRN: [id number]  Chief Complaint  Patient presents with   Follow-up    Pt states SOB, loosing voice.    Referring provider: [id number], MD  HPI: 70 year old female, never smoked. PMH significant for OSA, CHF, afib, cardiomyopathy, CKD stage 3, RLS, obesity. Patient of Dr. 66, last seen on 08/11/19. PSG 02/2016 showed mild OSA, AHI 8/hour/  Previous [facility] pulmonary encounter: 08/11/19- Dr. 08/13/19  70 yo never smoker, retired 78 for FU of dyspnea and Mild restriction on PFTs.   PMH -  atrial fibrillation controlled on Tikosyn. She has restrictive cardiomyopathy with negative cardiac biopsy at [facility] in the past. Essential tremor following with Neuro (neg aldolase/ck/acetylcholine recept)   CPAP is working well, has adjusted to full facemask, no problems with pressure. Download was reviewed.   She continues to have some shortness of breath on daily activities and has to take a break.  Feels chest tightness especially on days with high humidity, for the last 2 weeks.  No wheezing     10/11/2020- Interim hx  Patient saw cardiology yesterday. Reviewed their note. She saw Dr. 10/13/2020 in July 2022 with complaints of severe shortness of breath, possible neuromuscular disorder and advised to follow-up with neurologist and Dr. August 2022. She was seen in ED at [facility] on 10/02/20 after near syncopal episode while in pre-op area while waiting for her husband who was having surgery that day. EKG showed no changed, noted Sinus bradycardia. She goes in and out of aib which can last a couple of days. PFTs in July 2022 showed mild obstructive lung disease, normal diffusion capacity. Echocardiogram in January showed normal LV ejection fraction, grade 2 diastolic dysfunction and normal pulmonary artery systolic pressure. She loses her voice when talking or gets short of breath. She has lost 30 lbs without trying. She  has had a normal CT chest/abdomen/pelvis on 10/05/20, no suspicions nodules, minimal atelectasis. Cardiology ordered 14 day ZIO patch. Lasix has been decreased to 1 tablet daily along with her potassium. She has an apt in November with Dr. December neurology   Patient presents today for annual follow-up for OSA/ shortness of breath. Accompanied by her husband. She reports having worsening fatigue and non-restorative sleep. She wears CPAP at bedtime, due for a new machine and would like to be refitted for nwe mask. She would like to change DME companied from [facility] to [facility].   She has to sit down and rest quite often. At times she has some chest tightness and wheezing.  No significant cough. Previous PFTs testing did not reveal any overt obstructive airway disease. Albuterol has not helped in the past. She reports decreased appetite. No trouble swallowing or chocking. She loses her voice/gets winded while speaking. She saw ENT/ Dr. [facility] in July 2021 but does not want to return to see him.    Airview download 09/11/20-10/10/20 29/30 days used; 97% > 4 hours Average usage 7 hours 10/12/20 Pressure 5-10cm h20 (9.9cm h20- 95%) Airleak 9.2L/min (95%) AHI 6.0   Significant tests/ events reviewed CPET 03/2016 no clear indication for cardiopulmonary limitation. At peak exercise and with flat O2 pulse pattern, patient is likely limited due to diastolic dysfunction as a result of her body habitus. Expect weight loss and regular exercise would greatly improve exercise intolerance.   PFTs 01/2016 showed ratio 72, FVC 74%, FEV1 69%, TLC 91% and DLCO 73% suggesting moderate intraparenchymal restriction  sniff test >> no  diaphragmatic dysfunction   PSG 2008 at [facility] showed total sleep time 362 minutes and a low AHI PSG 02/2016 showed mild OSA with AHI 8/hour   Echo 01/2016 showed grade 2 diastolic dysfunction with RVSP 35   CT cors 02/2018 low risk CT angiogram 09/2013 was negative for pulmonary emboli    Allergies  Allergen Reactions   Topamax [Topiramate] Other (See Comments)    hallucinations    Codeine Nausea And Vomiting    VERTIGO   Oxycodone     Hives    Tramadol    Penicillins Rash    Immunization History  Administered Date(s) Administered   DTaP, 5 pertussis antigens 07/07/2014   Influenza Split 11/05/2010, 11/05/2011, 10/26/2012, 11/04/2013, 10/22/2015, 10/03/2017   Influenza, Quadrivalent, Recombinant, Inj, Pf 10/18/2017, 10/03/2018, 10/23/2019   Influenza-Unspecified 11/04/2015, 10/08/2016, 09/15/2018   PFIZER(Purple Top)SARS-COV-2 Vaccination 02/28/2019, 03/24/2019, 09/08/2019   Pneumococcal Conjugate-13 12/22/2015   Pneumococcal Polysaccharide-23 11/10/2008, 01/22/2015   Td 07/26/2003, 06/22/2013, 01/04/2014, 01/04/2014   Zoster, Live 11/29/2010    Past Medical History:  Diagnosis Date   Atrial fibrillation (HCC)    Back pain    nerve problems-had series of three injections   Bone spur    Spine   Chronic anticoagulation    Diastolic congestive heart failure (HCC)    Hearing loss    History of shingles    Hyperlipemia    Migraine headache    Obstructive sleep apnea    uses CPAP   OSA on CPAP    Paroxysmal atrial fibrillation (HCC)    Restrictive cardiomyopathy (HCC)    Tremor    Tubal pregnancy     Tobacco History: Social History   Tobacco Use  Smoking Status Never  Smokeless Tobacco Never   Counseling given: Not Answered   Outpatient Medications Prior to Visit  Medication Sig Dispense Refill   albuterol (VENTOLIN HFA) 108 (90 Base) MCG/ACT inhaler Inhale 2 puffs into the lungs every 6 (six) hours as needed for wheezing or shortness of breath. 18 g 12   atorvastatin (LIPITOR) 20 MG tablet TAKE 1 TABLET BY MOUTH DAILY AT 6PM 90 tablet 2   calcium-vitamin D (OSCAL WITH D) 500-200 MG-UNIT tablet Take 1 tablet by mouth.     dofetilide (TIKOSYN) 125 MCG capsule TAKE 3 CAPSULES BY MOUTH  TWICE DAILY 540 capsule 3   ezetimibe (ZETIA) 10 MG  tablet TAKE 1 TABLET EVERY DAY 90 tablet 3   furosemide (LASIX) 40 MG tablet Take 1 tablet (40 mg total) by mouth daily. 90 tablet 3   gabapentin (NEURONTIN) 600 MG tablet Take 900 mg by mouth at bedtime.     LIDOCAINE-MENTHOL, SPRAY, EX Apply topically.     Misc Natural Products (ECHINACEA GOLDENSEAL PLUS PO) take during the winter months     Multiple Vitamins-Minerals (ICAPS AREDS 2) CAPS Take 1 capsule by mouth 2 (two) times daily.     potassium chloride SA (KLOR-CON) 20 MEQ tablet Take 1 tablet (20 mEq total) by mouth daily. 90 tablet 3   propranolol (INDERAL) 10 MG tablet Take 10 mg by mouth 2 (two) times daily.     propranolol (INDERAL) 10 MG tablet Take 10 mg by mouth in the morning, at noon, in the evening, and at bedtime. From cardio for a fib     spironolactone (ALDACTONE) 25 MG tablet Take 1 tablet (25 mg total) by mouth daily. 90 tablet 2   XARELTO 20 MG TABS tablet TAKE 1 TABLET BY  MOUTH  DAILY WITH SUPPER 90 tablet 1   No facility-administered medications prior to visit.    Review of Systems  Review of Systems  Constitutional:  Positive for fatigue.  HENT: Negative.    Respiratory:  Positive for chest tightness and shortness of breath. Negative for cough and wheezing.   Cardiovascular:  Negative for leg swelling.  Neurological:  Positive for weakness.    Physical Exam  BP 120/70 (BP Location: Left Arm, Patient Position: Sitting, Cuff Size: Normal)   Pulse (!) 54   Temp 98.2 F (36.8 C) (Oral)   Ht 5\' 9"  (1.753 m)   Wt 204 lb 3.2 oz (92.6 kg)   LMP 01/21/1990   SpO2 99%   BMI 30.16 kg/m  Physical Exam Constitutional:      General: She is not in acute distress.    Appearance: Normal appearance. She is not ill-appearing.  HENT:     Head: Normocephalic and atraumatic.     Mouth/Throat:     Mouth: Mucous membranes are moist.     Pharynx: Oropharynx is clear.     Comments: Hoarseness/vocal fatigue  Cardiovascular:     Rate and Rhythm: Normal rate and regular  rhythm.  Pulmonary:     Effort: Pulmonary effort is normal.     Breath sounds: Normal breath sounds. No wheezing, rhonchi or rales.     Comments: CTA; Loses her breath while speaking Neurological:     General: No focal deficit present.     Mental Status: She is alert and oriented to person, place, and time. Mental status is at baseline.  Psychiatric:        Mood and Affect: Mood normal.        Behavior: Behavior normal.        Thought Content: Thought content normal.        Judgment: Judgment normal.     Lab Results:  CBC    Component Value Date/Time   WBC 8.1 07/10/2016 0854   WBC 9.4 09/30/2013 1655   RBC 4.77 07/10/2016 0854   RBC 4.41 09/30/2013 1655   HGB 13.8 07/10/2016 0854   HCT 40.1 07/10/2016 0854   PLT 273 07/10/2016 0854   MCV 84 07/10/2016 0854   MCH 28.9 07/10/2016 0854   MCH 27.0 09/30/2013 1655   MCHC 34.4 07/10/2016 0854   MCHC 32.2 09/30/2013 1655   RDW 14.3 07/10/2016 0854   LYMPHSABS 1.8 07/10/2016 0854   MONOABS 0.5 03/12/2007 1735   EOSABS 0.1 07/10/2016 0854   BASOSABS 0.0 07/10/2016 0854    BMET    Component Value Date/Time   NA 141 10/10/2020 1130   K 4.4 10/10/2020 1130   CL 98 10/10/2020 1130   CO2 26 10/10/2020 1130   GLUCOSE 91 10/10/2020 1130   GLUCOSE 101 (H) 04/03/2020 1000   BUN 16 10/10/2020 1130   CREATININE 0.91 10/10/2020 1130   CREATININE 0.95 01/01/2016 0839   CALCIUM 9.7 10/10/2020 1130   GFRNONAA >60 04/03/2020 1000   GFRAA 76 12/07/2019 0839    BNP No results found for: BNP  ProBNP    Component Value Date/Time   PROBNP 237 07/14/2020 0925   PROBNP 117.8 09/30/2013 1642    Imaging: CT CHEST ABDOMEN PELVIS W CONTRAST  Result Date: 10/05/2020 CLINICAL DATA:  Dyspnea on exertion and weight loss EXAM: CT CHEST, ABDOMEN, AND PELVIS WITH CONTRAST TECHNIQUE: Multidetector CT imaging of the chest, abdomen and pelvis was performed following the standard protocol during bolus administration of intravenous  contrast.  CONTRAST:  30mL ISOVUE-370 IOPAMIDOL (ISOVUE-370) INJECTION 76% COMPARISON:  Chest CT 09/30/2013 FINDINGS: CT CHEST FINDINGS Cardiovascular: Normal heart size. No pericardial effusion. No significant vascular finding. Mediastinum/Nodes: Negative for adenopathy or mass. No chest wall mass. Lungs/Pleura: There is no edema, consolidation, effusion, or pneumothorax. No suspicious nodules. Minimal atelectasis. Musculoskeletal: No acute or aggressive finding. Spondylosis and mild scoliosis. CT ABDOMEN PELVIS FINDINGS Hepatobiliary: No focal liver abnormality.Cholecystectomy. Prominence of common bile duct diameter is likely reservoir effect. Pancreas: Unremarkable. Spleen: Unremarkable. Adrenals/Urinary Tract: Negative adrenals. No hydronephrosis or stone. Unremarkable bladder. Stomach/Bowel:  No obstruction. No appendicitis. Vascular/Lymphatic: No acute vascular abnormality. No mass or adenopathy. Reproductive:No pathologic findings. Other: No ascites or pneumoperitoneum. Fatty midline hernia with wide neck. Musculoskeletal: No acute abnormalities. Lumbar degeneration and scoliosis. IMPRESSION: 1. Benign CT of the body. No evidence of mass or inflammation. 2. Fatty umbilical hernia. Electronically Signed   By: Tiburcio Pea M.D.   On: 10/05/2020 11:39     Assessment & Plan:   OSA (obstructive sleep apnea) - Patient reports non-resorative sleep despite CPAP compliant.Pressure 5-10cm h20 (average 9.9cm h20); AHI 6.0/hr. She is due for a new machine and wants to be fitted for new mask. We will change pressure to auto titrate 5-15cm h20 d/t residual AHI. They are requesting to change DME companied from [employer] to [facility]. We have placed these orders.   Dyspnea - Patient reports shortness of breath, albuterol provides no relief. She gets winded/loses her breath while speaking. Pulmonary function testing has shown no significant obstruction in her lung function. Concern for underlying neuromuscular disease. She will  be seeing neurology in November. She is also being set up for ZIO patch with cardiology. If exam is normal would refer to ENT for possible vocal cord dysfunction.   40 mins spent on case; 50% face to face   Glenford Bayley, NP 10/13/2020

## 2020-10-12 DIAGNOSIS — I480 Paroxysmal atrial fibrillation: Secondary | ICD-10-CM

## 2020-10-13 ENCOUNTER — Encounter: Payer: Self-pay | Admitting: Primary Care

## 2020-10-13 NOTE — Assessment & Plan Note (Signed)
-   Patient reports shortness of breath, albuterol provides no relief. She gets winded/loses her breath while speaking. Pulmonary function testing has shown no significant obstruction in her lung function. Concern for underlying neuromuscular disease. She will be seeing neurology in November. She is also being set up for ZIO patch with cardiology. If exam is normal would refer to ENT for possible vocal cord dysfunction.

## 2020-10-13 NOTE — Assessment & Plan Note (Signed)
-   Patient reports non-resorative sleep despite CPAP compliant.Pressure 5-10cm h20 (average 9.9cm h20); AHI 6.0/hr. She is due for a new machine and wants to be fitted for new mask. We will change pressure to auto titrate 5-15cm h20 d/t residual AHI. They are requesting to change DME companied from [employer] to [facility]. We have placed these orders.

## 2020-11-01 DIAGNOSIS — I480 Paroxysmal atrial fibrillation: Secondary | ICD-10-CM | POA: Diagnosis not present

## 2020-11-11 DIAGNOSIS — Z23 Encounter for immunization: Secondary | ICD-10-CM | POA: Diagnosis not present

## 2020-12-05 ENCOUNTER — Ambulatory Visit: Payer: Medicare PPO | Admitting: Diagnostic Neuroimaging

## 2020-12-05 ENCOUNTER — Encounter: Payer: Self-pay | Admitting: Diagnostic Neuroimaging

## 2020-12-05 VITALS — BP 124/71 | HR 47 | Ht 69.0 in | Wt 206.6 lb

## 2020-12-05 DIAGNOSIS — G250 Essential tremor: Secondary | ICD-10-CM | POA: Diagnosis not present

## 2020-12-05 NOTE — Progress Notes (Signed)
[facility]  PATIENT: [name] DOB: November 18, 1950  REFERRING CLINICIAN: Aviva Signs HISTORY FROM: patient  REASON FOR VISIT: follow up    HISTORICAL  CHIEF COMPLAINT:  Chief Complaint  Patient presents with   Essential Tremor    Rm 6, 6 month FU "taking propranolol twice a day now, had to stop for a while due to episode of passing out/collapsing"     HISTORY OF PRESENT ILLNESS:   UPDATE (12/05/20, VRP): Since last visit, doing well except had syncope event in pre-op area for husband surgery, in 10/02/20. Had prodromal sweating, heart racing, energy draining sensations. Occurred while sitting down. Then passed out and was taken to ER. Left before being seen officially, after waiting 13 hours.   UPDATE (05/19/20, VRP): Since last visit, tremor continues and now patient wants to try meds. Has propranolol as needed (rarely uses currently) for atrial fibrillation. Tremor worse with fatigue.  UPDATE 07/29/16: Since last visit, doing a little better. Now working in garden and around the home, and feeling better. Walking short distances at home (5-10 minutes). Tremor stable.   PRIOR HPI (03/25/16): 70 year old female here for evaluation of shortness of breath and dyspnea on exertion. Since January 2018 patient has had intermittent problems with shortness of breath and dyspnea on exertion, and has had thorough evaluation by pulmonary and cardiology physicians, without specific cause found. She does have history of atrial fibrillation, atrial flutter, congestive heart failure, but these do not appear to be explain patient's dyspnea symptoms adequately, and therefore possibility of an occult neuromuscular disease has been raised and therefore patient presenting to me for further evaluation. Patient requested for me to see her for this problem due to her meeting me while I was treating one of her relatives.  Patient reports a variety of constitutional symptoms include chest pain,  ringing in ears spinning sensation shortness of breath snoring tremor. Patient can feel shortness of breath when she exerts herself as well as at rest. Sometimes she thinks about a problem her symptoms can get worse. She denies any muscle twitching, muscle weakness focally.   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 Multiple family members on her father's side including father, sister, nephew and others have history of tremor. Some of them have tremor in chin, head and some in the hands. Patient has been tried on primidone in the past without relief and some aggravation of symptoms.    REVIEW OF SYSTEMS: Full 14 system review of systems performed and negative with exception of: hearing loss.   ALLERGIES: Allergies  Allergen Reactions   Topamax [Topiramate] Other (See Comments)    hallucinations    Codeine Nausea And Vomiting    VERTIGO   Oxycodone     Hives    Tramadol    Penicillins Rash    HOME MEDICATIONS: Outpatient Medications Prior to Visit  Medication Sig Dispense Refill   albuterol (VENTOLIN HFA) 108 (90 Base) MCG/ACT inhaler Inhale 2 puffs into the lungs every 6 (six) hours as needed for wheezing or shortness of breath. 18 g 12   atorvastatin (LIPITOR) 20 MG tablet TAKE 1 TABLET BY MOUTH DAILY AT 6PM 90 tablet 2   calcium-vitamin D (OSCAL WITH D) 500-200 MG-UNIT tablet Take 1 tablet by mouth. 1067mcg     dofetilide (TIKOSYN) 125 MCG capsule TAKE 3 CAPSULES BY MOUTH  TWICE DAILY 540 capsule 3   ezetimibe (ZETIA) 10 MG tablet TAKE 1  TABLET EVERY DAY 90 tablet 3   furosemide (LASIX) 40 MG tablet Take 1 tablet (40 mg total) by mouth daily. 90 tablet 3   gabapentin (NEURONTIN) 600 MG tablet Take 900 mg by mouth at bedtime.     LIDOCAINE-MENTHOL, SPRAY, EX Apply topically.     Misc Natural Products (ECHINACEA GOLDENSEAL PLUS PO) take during the winter months     Multiple Vitamins-Minerals (ICAPS AREDS 2) CAPS Take 1  capsule by mouth 2 (two) times daily.     potassium chloride SA (KLOR-CON) 20 MEQ tablet Take 1 tablet (20 mEq total) by mouth daily. 90 tablet 3   propranolol (INDERAL) 10 MG tablet Take 10 mg by mouth 2 (two) times daily.     spironolactone (ALDACTONE) 25 MG tablet Take 1 tablet (25 mg total) by mouth daily. 90 tablet 2   XARELTO 20 MG TABS tablet TAKE 1 TABLET BY MOUTH  DAILY WITH SUPPER 90 tablet 1   propranolol (INDERAL) 10 MG tablet Take 10 mg by mouth in the morning, at noon, in the evening, and at bedtime. From cardio for a fib (Patient not taking: No sig reported)     No facility-administered medications prior to visit.    PAST MEDICAL HISTORY: Past Medical History:  Diagnosis Date   Atrial fibrillation (HCC)    Back pain    nerve problems-had series of three injections   Bone spur    Spine   Chronic anticoagulation    Diastolic congestive heart failure (HCC)    Hearing loss    History of shingles    Hyperlipemia    Migraine headache    Obstructive sleep apnea    uses CPAP   OSA on CPAP    Paroxysmal atrial fibrillation (HCC)    Restrictive cardiomyopathy (HCC)    Tremor    Tubal pregnancy     PAST SURGICAL HISTORY: Past Surgical History:  Procedure Laterality Date   BREAST EXCISIONAL BIOPSY Left    benign   CARDIAC CATHETERIZATION     CARDIOVERSION     CHOLECYSTECTOMY     ECTOPIC PREGNANCY SURGERY  1980   EYE SURGERY     left eye injections x2-was having "stroke like" flashes of light   heart biopsy  2008   [name]   HYSTEROSCOPY  6/05   and D&C-simple hyperplasia secondary PMP bleeding   ROTATOR CUFF REPAIR     bilateral    FAMILY HISTORY: Family History  Problem Relation Age of Onset   Hypertension Father    Dementia Father    Seizures Father    Atrial fibrillation Mother    Cancer Maternal Grandmother        mouth   Cancer Maternal Uncle        unknown type   Thyroid cancer Sister    Breast cancer Sister        breast cancer   Heart disease  Sister    Atrial fibrillation Sister    Dementia Sister     SOCIAL HISTORY:  Social History   Socioeconomic History   Marital status: Married    Spouse name: [name]   Number of children: 0   Years of education: college   Highest education level: Not on file  Occupational History    Comment: retired  Tobacco Use   Smoking status: Never   Smokeless tobacco: Never  Vaping Use   Vaping Use: Never used  Substance and Sexual Activity   Alcohol use: Not Currently   Drug use:  No   Sexual activity: Not Currently    Birth control/protection: Post-menopausal  Other Topics Concern   Not on file  Social History Narrative   Patient lives in [place] with her husband Jenny Reichmann)  and she is retired. College education.   Caffeine- 2-3 cups tea daily .   Social Determinants of Health   Financial Resource Strain: Not on file  Food Insecurity: Not on file  Transportation Needs: Not on file  Physical Activity: Not on file  Stress: Not on file  Social Connections: Not on file  Intimate Partner Violence: Not on file     PHYSICAL EXAM  GENERAL EXAM/CONSTITUTIONAL: Vitals:  Vitals:   12/05/20 0856  BP: 124/71  Pulse: (!) 47  Weight: 206 lb 9.6 oz (93.7 kg)  Height: 5\' 9"  (1.753 m)   Wt Readings from Last 3 Encounters:  12/05/20 206 lb 9.6 oz (93.7 kg)  10/11/20 204 lb 3.2 oz (92.6 kg)  10/10/20 202 lb 9.6 oz (91.9 kg)   Body mass index is 30.51 kg/m. No results found. Patient is in no distress; well developed, nourished and groomed; neck is supple  CARDIOVASCULAR: Examination of carotid arteries is normal; no carotid bruits Regular rate and rhythm, no murmurs Examination of peripheral vascular system by observation and palpation is normal  EYES: Ophthalmoscopic exam of optic discs and posterior segments is normal; no papilledema or hemorrhages  MUSCULOSKELETAL: Gait, strength, tone, movements noted in Neurologic exam below  NEUROLOGIC: MENTAL STATUS:  No flowsheet  data found. awake, alert, oriented to person, place and time recent and remote memory intact normal attention and concentration language fluent, comprehension intact, naming intact,  fund of knowledge appropriate  CRANIAL NERVE:  2nd - no papilledema on fundoscopic exam 2nd, 3rd, 4th, 6th - pupils equal and reactive to light, visual fields full to confrontation, extraocular muscles intact, no nystagmus 5th - facial sensation symmetric 7th - facial strength symmetric 8th - hearing intact 9th - palate elevates symmetrically, uvula midline 11th - shoulder shrug symmetric 12th - tongue protrusion midline MILD HEAD TREMOR  MOTOR:  normal bulk and tone, full strength in the BUE, BLE MILD POSTURAL TREMOR IN BUE NO BRADYKINESIA  SENSORY:  normal and symmetric to light touch, temperature, vibration  COORDINATION:  finger-nose-finger, fine finger movements --> ACTION TREMOR  REFLEXES:  deep tendon reflexes present and symmetric TRACE AT ANKLES  GAIT/STATION:  narrow based gait    DIAGNOSTIC DATA (LABS, IMAGING, TESTING) - I reviewed patient records, labs, notes, testing and imaging myself where available.  Lab Results  Component Value Date   WBC 8.1 07/10/2016   HGB 13.8 07/10/2016   HCT 40.1 07/10/2016   MCV 84 07/10/2016   PLT 273 07/10/2016      Component Value Date/Time   NA 141 10/10/2020 1130   K 4.4 10/10/2020 1130   CL 98 10/10/2020 1130   CO2 26 10/10/2020 1130   GLUCOSE 91 10/10/2020 1130   GLUCOSE 101 (H) 04/03/2020 1000   BUN 16 10/10/2020 1130   CREATININE 0.91 10/10/2020 1130   CREATININE 0.95 01/01/2016 0839   CALCIUM 9.7 10/10/2020 1130   PROT 7.0 07/23/2017 0847   ALBUMIN 4.2 07/23/2017 0847   AST 14 07/23/2017 0847   ALT 9 07/23/2017 0847   ALKPHOS 127 (H) 07/23/2017 0847   BILITOT 0.4 07/23/2017 0847   GFRNONAA >60 04/03/2020 1000   GFRAA 76 12/07/2019 0839   Lab Results  Component Value Date   CHOL 134 07/23/2017   HDL 40  07/23/2017    LDLCALC 63 07/23/2017   TRIG 153 (H) 07/23/2017   CHOLHDL 3.4 07/23/2017   Lab Results  Component Value Date   HGBA1C 5.8 (H) 03/25/2016   No results found for: VITAMINB12 Lab Results  Component Value Date   TSH 1.020 07/14/2020    03/05/16 Dg Sniff chest - Normal diaphragmatic function.  06/12/12 MRI lumbar [I reviewed images myself and agree with interpretation. -VRP]  - No distinct cause of left hip pain is identified. - Curvature convex to the right with the apex at L2-3. - Shallow central disc herniation at L3-4 that contacts the thecal sac but does not appear to cause neural compression.  Mild facet arthropathy at this level that could conceivably be symptomatic.  - L4-5:  Bulging of the disc.  Bilateral facet degeneration.  No apparent compressive stenosis.  The facet disease could possibly be symptomatic. - L5 S1:  Right foraminal to extraforaminal osteophyte and bulging disc could possibly irritate the right L5 nerve root.  Definite neural compression is not demonstrated however.  04/18/16 exercise testing - Exercise testing with gas exchange demonstrates normal functional capacity when compared to matched sedentary norms. There is no clear indication for cardiopulmonary limitation. At peak exercise and with flat O2 pulse pattern, patient is likely limited due to diastolic dysfunction as a result of her body habitus. Expect weight loss and regular exercise would greatly improve exercise intolerance.  04/23/16 MRI brain (with and without)  1.   The brain parenchyma appears normal for age 65.   There is an air fluid level within the left maxillary sinus consistent with acute sinusitis 3.   No acute findings.  03/25/16 LABS - CK, aldolase, AchR, TSH --> all normal    ASSESSMENT AND PLAN  71 y.o. year old female here with here with new onset dyspnea on exertion, shortness of breath, fatigue, since January 2018. No evidence of underlying neuromuscular disorder.  Also with  history of essential tremor.  Dx: shortness of breath / fatigue (due to mild deconditioning and obesity) + ESSENTIAL TREMOR  1. Essential tremor       PLAN:  ESSENTIAL TREMOR - regarding tremor control, currently on propranolol 10mg  twice a day from cardiology for atrial fibrillation - may consider increasing gabapentin in future; may consider primidone as well - may consider deep brain stimulator in future  SYNCOPE (10/02/20) - per cardiology and PCP  Return for return to PCP, pending if symptoms worsen or fail to improve.    12/02/20, MD 12/05/2020, 9:26 AM Certified in Neurology, Neurophysiology and Neuroimaging  [facility] [address] [place], [place] [phone]

## 2020-12-18 DIAGNOSIS — M461 Sacroiliitis, not elsewhere classified: Secondary | ICD-10-CM | POA: Diagnosis not present

## 2020-12-27 ENCOUNTER — Other Ambulatory Visit: Payer: Self-pay | Admitting: Cardiovascular Disease

## 2021-01-04 ENCOUNTER — Ambulatory Visit: Payer: Medicare PPO | Admitting: Pulmonary Disease

## 2021-01-04 ENCOUNTER — Encounter: Payer: Self-pay | Admitting: Pulmonary Disease

## 2021-01-04 ENCOUNTER — Other Ambulatory Visit: Payer: Self-pay

## 2021-01-04 VITALS — BP 128/72 | HR 61 | Temp 98.2°F | Ht 69.0 in | Wt 207.2 lb

## 2021-01-04 DIAGNOSIS — G4733 Obstructive sleep apnea (adult) (pediatric): Secondary | ICD-10-CM

## 2021-01-04 DIAGNOSIS — Z9989 Dependence on other enabling machines and devices: Secondary | ICD-10-CM

## 2021-01-04 NOTE — Progress Notes (Signed)
Subjective:    Patient ID: [name], female    DOB: February 05, 1950, 70 y.o.   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   PMH -  atrial fibrillation controlled on Tikosyn. She has restrictive cardiomyopathy with negative cardiac biopsy at [facility] in the past. Essential tremor following with Neuro (neg aldolase/ck/acetylcholine recept)  She was seen in ED at [facility] on 10/02/20 after near syncopal episode while in pre-op area while waiting for her husband who was having cataract surgery that day. EKG showed no changed,Sinus bradycardia. She goes in and out of afib which can last a couple of days. She loses her voice when talking or gets short of breath. She has lost 30 lbs without trying. She has had a normal CT chest/abdomen/pelvis on 10/05/20, no suspicions nodules, minimal atelectasis.  Zio patch did not show any evidence of atrial fibrillation, very brief runs of atrial tachycardia lasting up to 14 seconds and considered benign, mild sinus bradycardia  I have reviewed cardiology and neurology consultations She is accompanied by her husband today who is on a CPAP machine.  He feels that she does not rest well at night he has witnessed apneas.  CPAP download was reviewed which shows residual AHI of 10/hour on auto settings 5 to 10 cm with average pressure of 10 cm, good compliance more than 7 hours every night and no leak  Medication review shows propranolol and Neurontin. She takes Lasix 40 mg daily and continues to complain of shortness of breath.  Her voice is weak and she cannot sustain talking for more than a few minutes  Regarding her weight loss, CT chest/abdomen/pelvis 09/2020 has been negative   Significant tests/ events reviewed CPET 03/2016 no clear indication for cardiopulmonary limitation. At peak exercise and with flat O2 pulse pattern, patient is likely limited due to diastolic dysfunction  as a result of her body habitus. Expect weight loss and regular exercise would greatly improve exercise intolerance.   PFT 07/2020 very mild restrictive lung disease, normal DLCO?  Extraparenchymal   PFTs 01/2016 showed ratio 72, FVC 74%, FEV1 69%, TLC 91% and DLCO 73% suggesting moderate intraparenchymal restriction sniff test >> no  diaphragmatic dysfunction     PSG 2008 at [facility] showed total sleep time 362 minutes and a low AHI PSG 02/2016 showed mild OSA with AHI 8/hour  Echo 01/2020 normal LVEF grade 2 diastolic dysfunction, normal RVSP Echo 01/2016 showed grade 2 diastolic dysfunction with RVSP 35   CT cors 02/2018 low risk CT angiogram 09/2013 was negative for pulmonary emboli  02/2016 sniff test normal diaphragmatic function   Review of Systems neg for any significant sore throat, dysphagia, itching, sneezing, nasal congestion or excess/ purulent secretions, fever, chills, sweats, unintended wt loss, pleuritic or exertional cp, hempoptysis, orthopnea pnd or change in chronic leg swelling. Also denies presyncope, palpitations, heartburn, abdominal pain, nausea, vomiting, diarrhea or change in bowel or urinary habits, dysuria,hematuria, rash, arthralgias, visual complaints, headache, numbness weakness or ataxia.     Objective:   Physical Exam  Gen. Pleasant, well-nourished, in no distress ENT - no thrush, no pallor/icterus,no post nasal drip Neck: No JVD, no thyromegaly, no carotid bruits Lungs: no use of accessory muscles, no dullness to percussion, clear without rales or rhonchi  Cardiovascular: Rhythm regular, heart sounds  normal, no murmurs or gallops, no peripheral edema Musculoskeletal: No deformities, no cyanosis or clubbing  Assessment & Plan:    She has acquired constellation of symptoms including severe weight loss, unexplained dyspnea and weak voice with essential tremors.  Previous work-up for neuromuscular disease has been negative and she has also had an  recent neurological exam.  PFTs show extraparenchymal restriction likely suggesting neuromuscular cause since she does not have any kyphoscoliosis.  She does not have any diaphragmatic dysfunction by previous sniff test and CPET has not shown any cardiopulmonary limitation.  She does not have any significant pulmonary vascular disease.  However once again question whether she has neuromuscular disease

## 2021-01-04 NOTE — Patient Instructions (Addendum)
°  X Rx for new full face mask to [facility]  X Increase CPAP to auto 5-15 cm , check DL in 2 weeks  X Schedule CPAP titration study

## 2021-01-04 NOTE — Assessment & Plan Note (Signed)
She does have residual events on her download, we will increase auto settings to 5-15 cm We will provide her with a new fullface mask and set her up for a titration study to see if she can also be fitted with a better mask and also to see whether she will need BiPAP.  I wonder about nocturnal hypoventilation, I do note that serum bicarbonate is not elevated so do not think that she has daytime hypercapnia

## 2021-01-08 ENCOUNTER — Other Ambulatory Visit: Payer: Self-pay | Admitting: *Deleted

## 2021-01-08 MED ORDER — DOFETILIDE 125 MCG PO CAPS: 375.0000 ug | ORAL_CAPSULE | Freq: Two times a day (BID) | ORAL | 3 refills | Status: DC

## 2021-01-08 MED ORDER — FUROSEMIDE 40 MG PO TABS: 40.0000 mg | ORAL_TABLET | Freq: Every day | ORAL | 2 refills | Status: DC

## 2021-01-10 DIAGNOSIS — E538 Deficiency of other specified B group vitamins: Secondary | ICD-10-CM | POA: Diagnosis not present

## 2021-01-16 ENCOUNTER — Other Ambulatory Visit: Payer: Self-pay | Admitting: Cardiovascular Disease

## 2021-01-25 DIAGNOSIS — M461 Sacroiliitis, not elsewhere classified: Secondary | ICD-10-CM | POA: Diagnosis not present

## 2021-02-04 ENCOUNTER — Other Ambulatory Visit: Payer: Self-pay | Admitting: Cardiovascular Disease

## 2021-02-09 ENCOUNTER — Ambulatory Visit: Payer: Medicare PPO | Admitting: Podiatry

## 2021-02-09 ENCOUNTER — Other Ambulatory Visit: Payer: Self-pay

## 2021-02-09 ENCOUNTER — Ambulatory Visit (INDEPENDENT_AMBULATORY_CARE_PROVIDER_SITE_OTHER): Payer: Medicare PPO

## 2021-02-09 DIAGNOSIS — M79674 Pain in right toe(s): Secondary | ICD-10-CM

## 2021-02-09 DIAGNOSIS — M2042 Other hammer toe(s) (acquired), left foot: Secondary | ICD-10-CM | POA: Diagnosis not present

## 2021-02-09 DIAGNOSIS — M79675 Pain in left toe(s): Secondary | ICD-10-CM | POA: Diagnosis not present

## 2021-02-09 DIAGNOSIS — B351 Tinea unguium: Secondary | ICD-10-CM

## 2021-02-09 DIAGNOSIS — M79672 Pain in left foot: Secondary | ICD-10-CM

## 2021-02-09 DIAGNOSIS — Z7901 Long term (current) use of anticoagulants: Secondary | ICD-10-CM | POA: Diagnosis not present

## 2021-02-09 MED ORDER — EFINACONAZOLE 10 % EX SOLN: 1.0000 [drp] | Freq: Every day | CUTANEOUS | 11 refills | Status: DC

## 2021-02-13 DIAGNOSIS — E538 Deficiency of other specified B group vitamins: Secondary | ICD-10-CM | POA: Diagnosis not present

## 2021-02-13 DIAGNOSIS — I2721 Secondary pulmonary arterial hypertension: Secondary | ICD-10-CM | POA: Diagnosis not present

## 2021-02-13 DIAGNOSIS — E785 Hyperlipidemia, unspecified: Secondary | ICD-10-CM | POA: Diagnosis not present

## 2021-02-13 DIAGNOSIS — E876 Hypokalemia: Secondary | ICD-10-CM | POA: Diagnosis not present

## 2021-02-13 NOTE — Progress Notes (Signed)
Subjective:   Patient ID: [name], female   DOB: 71 y.o.   MRN: [id number]   HPI 71 year old female presents the office today for concerns of pain mostly to her left second toe but she also gets pain in her third and fourth toes.  She said this started about a year ago after she had her toe on a chair.  She describes a sharp pain which has become more consistent.  No open lesions that she reports that she had no treatment.  After she has concerns about toenail fungus toenails becoming thick and elongated she cannot trim them herself.  Denies any swelling or redness or any signs of infection.   Review of Systems  All other systems reviewed and are negative.  Past Medical History:  Diagnosis Date   Atrial fibrillation (HCC)    Back pain    nerve problems-had series of three injections   Bone spur    Spine   Chronic anticoagulation    Diastolic congestive heart failure (HCC)    Hearing loss    History of shingles    Hyperlipemia    Migraine headache    Obstructive sleep apnea    uses CPAP   OSA on CPAP    Paroxysmal atrial fibrillation (HCC)    Restrictive cardiomyopathy (HCC)    Tremor    Tubal pregnancy     Past Surgical History:  Procedure Laterality Date   BREAST EXCISIONAL BIOPSY Left    benign   CARDIAC CATHETERIZATION     CARDIOVERSION     CHOLECYSTECTOMY     ECTOPIC PREGNANCY SURGERY  1980   EYE SURGERY     left eye injections x2-was having "stroke like" flashes of light   heart biopsy  2008   [facility]   HYSTEROSCOPY  6/05   and D&C-simple hyperplasia secondary PMP bleeding   ROTATOR CUFF REPAIR     bilateral     Current Outpatient Medications:    Efinaconazole 10 % SOLN, Apply 1 drop topically daily., Disp: 4 mL, Rfl: 11   albuterol (VENTOLIN HFA) 108 (90 Base) MCG/ACT inhaler, Inhale 2 puffs into the lungs every 6 (six) hours as needed for wheezing or shortness of breath., Disp: 18 g, Rfl: 12   atorvastatin (LIPITOR) 20 MG tablet, TAKE 1 TABLET  BY MOUTH DAILY AT 6PM, Disp: 90 tablet, Rfl: 2   calcium-vitamin D (OSCAL WITH D) 500-200 MG-UNIT tablet, Take 1 tablet by mouth. , Disp: , Rfl:    dofetilide (TIKOSYN) 125 MCG capsule, TAKE 3 CAPSULES (375 MCG TOTAL) BY MOUTH 2 (TWO) TIMES DAILY., Disp: 540 capsule, Rfl: 2   ezetimibe (ZETIA) 10 MG tablet, TAKE 1 TABLET EVERY DAY, Disp: 90 tablet, Rfl: 3   furosemide (LASIX) 40 MG tablet, Take 1 tablet (40 mg total) by mouth daily., Disp: 90 tablet, Rfl: 2   gabapentin (NEURONTIN) 600 MG tablet, Take 900 mg by mouth at bedtime., Disp: , Rfl:    LIDOCAINE-MENTHOL, SPRAY, EX, Apply topically., Disp: , Rfl:    Misc Natural Products (ECHINACEA GOLDENSEAL PLUS PO), take during the winter months, Disp: , Rfl:    Multiple Vitamins-Minerals (ICAPS AREDS 2) CAPS, Take 1 capsule by mouth 2 (two) times daily., Disp: , Rfl:    potassium chloride SA (KLOR-CON) 20 MEQ tablet, Take 1 tablet (20 mEq total) by mouth daily., Disp: 90 tablet, Rfl: 3   propranolol (INDERAL) 10 MG tablet, Take 10 mg by mouth 2 (two) times daily., Disp: , Rfl:  spironolactone (ALDACTONE) 25 MG tablet, Take 1 tablet (25 mg total) by mouth daily., Disp: 90 tablet, Rfl: 2   XARELTO 20 MG TABS tablet, TAKE 1 TABLET BY MOUTH  DAILY WITH SUPPER, Disp: 90 tablet, Rfl: 1  Allergies  Allergen Reactions   Topamax [Topiramate] Other (See Comments)    hallucinations    Codeine Nausea And Vomiting    VERTIGO   Oxycodone     Hives    Tramadol    Penicillins Rash          Objective:  Physical Exam  General: AAO x3, NAD  Dermatological: Nails appear to be hypertrophic, dystrophic with yellow to brown discoloration.  Tenderness nails 1-5 bilaterally.  No edema, erythema to the toenail sites.  There is no open lesions.  Vascular: Dorsalis Pedis artery and Posterior Tibial artery pedal pulses are 2/4 bilateral with immedate capillary fill time. There is no pain with calf compression, swelling, warmth, erythema.   Neruologic:  Grossly intact via light touch bilateral.   Musculoskeletal: Hammertoe contractures are present most notably on the left second toe which is bridging along the PIPJ.  Tenderness palpation on the PIPJ.  There is no other areas of tenderness.  Muscular strength 5/5 in all groups tested bilateral.  Gait: Unassisted, Nonantalgic.       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  Hammertoes are present there is arthritic changes mostly the left second PIPJ.  No evidence of acute fracture. -Regards to the hammertoes we discussed shoe modification, offloading pads.  Ultimately discussed surgical intervention if needed but he is going to hold off on that for now given other medical issues. -Sharply debrided the nails x10 without any complications or bleeding.  Discussed treatment options.  Prescribe Jublia.   [name]

## 2021-02-15 ENCOUNTER — Ambulatory Visit (HOSPITAL_BASED_OUTPATIENT_CLINIC_OR_DEPARTMENT_OTHER): Payer: Medicare PPO | Attending: Pulmonary Disease | Admitting: Pulmonary Disease

## 2021-02-15 ENCOUNTER — Other Ambulatory Visit: Payer: Self-pay

## 2021-02-15 DIAGNOSIS — G4733 Obstructive sleep apnea (adult) (pediatric): Secondary | ICD-10-CM | POA: Diagnosis not present

## 2021-02-15 DIAGNOSIS — Z9989 Dependence on other enabling machines and devices: Secondary | ICD-10-CM | POA: Insufficient documentation

## 2021-02-18 ENCOUNTER — Other Ambulatory Visit: Payer: Self-pay | Admitting: Cardiovascular Disease

## 2021-02-19 NOTE — Telephone Encounter (Signed)
Prescription refill request for Xarelto received.  Indication: Afib  Last office visit: 10/10/20 Geni Bers)  Weight: 91.2kg Age: 71 Scr: 0.91 (10/10/20)  CrCl: 82.71ml/min  Appropriate dose and refill sent to requested pharmacy.

## 2021-02-20 DIAGNOSIS — I5189 Other ill-defined heart diseases: Secondary | ICD-10-CM | POA: Diagnosis not present

## 2021-02-20 DIAGNOSIS — I480 Paroxysmal atrial fibrillation: Secondary | ICD-10-CM | POA: Diagnosis not present

## 2021-02-20 DIAGNOSIS — Z0000 Encounter for general adult medical examination without abnormal findings: Secondary | ICD-10-CM | POA: Diagnosis not present

## 2021-02-20 DIAGNOSIS — R5383 Other fatigue: Secondary | ICD-10-CM | POA: Diagnosis not present

## 2021-02-20 DIAGNOSIS — M2040 Other hammer toe(s) (acquired), unspecified foot: Secondary | ICD-10-CM | POA: Diagnosis not present

## 2021-02-20 DIAGNOSIS — N1831 Chronic kidney disease, stage 3a: Secondary | ICD-10-CM | POA: Diagnosis not present

## 2021-02-20 DIAGNOSIS — E785 Hyperlipidemia, unspecified: Secondary | ICD-10-CM | POA: Diagnosis not present

## 2021-02-20 DIAGNOSIS — Z7901 Long term (current) use of anticoagulants: Secondary | ICD-10-CM | POA: Diagnosis not present

## 2021-02-20 DIAGNOSIS — R82998 Other abnormal findings in urine: Secondary | ICD-10-CM | POA: Diagnosis not present

## 2021-02-20 DIAGNOSIS — R0609 Other forms of dyspnea: Secondary | ICD-10-CM | POA: Diagnosis not present

## 2021-02-21 ENCOUNTER — Telehealth: Payer: Self-pay | Admitting: Pulmonary Disease

## 2021-02-21 DIAGNOSIS — Z9989 Dependence on other enabling machines and devices: Secondary | ICD-10-CM | POA: Diagnosis not present

## 2021-02-21 DIAGNOSIS — G4733 Obstructive sleep apnea (adult) (pediatric): Secondary | ICD-10-CM | POA: Diagnosis not present

## 2021-02-21 NOTE — Procedures (Signed)
Patient Name: [name] [name] Date: 02/15/2021 Gender: Female D.O.B: 1950/05/12 Age (years): 87 Referring Provider: Cyril Mourning MD, ABSM Height (inches): 70 Interpreting Physician: Cyril Mourning MD, ABSM Weight (lbs): 201 RPSGT: Armen Pickup BMI: 29 MRN: [id number] Neck Size: 16.00 <br> <br>   CLINICAL INFORMATION The patient is referred for a CPAP titration to treat sleep apnea.    Date of NPSG:  02/2016 showed mild OSA with AHI 8/hour  SLEEP STUDY TECHNIQUE As per the AASM Manual for the Scoring of Sleep and Associated Events v2.3 (April 2016) with a hypopnea requiring 4% desaturations.  The channels recorded and monitored were frontal, central and occipital EEG, electrooculogram (EOG), submentalis EMG (chin), nasal and oral airflow, thoracic and abdominal wall motion, anterior tibialis EMG, snore microphone, electrocardiogram, and pulse oximetry. Continuous positive airway pressure (CPAP) was initiated at the beginning of the study and titrated to treat sleep-disordered breathing.  MEDICATIONS Medications self-administered by patient taken the night of the study : GABAPENTIN  TECHNICIAN COMMENTS Comments added by technician: No restroom visted Comments added by scorer: N/A RESPIRATORY PARAMETERS Optimal PAP Pressure (cm): 10 AHI at Optimal Pressure (/hr): 0 Overall Minimal O2 (%): 86.0 Supine % at Optimal Pressure (%): 7 Minimal O2 at Optimal Pressure (%): 86.0   SLEEP ARCHITECTURE The study was initiated at 9:43:37 PM and ended at 4:11:38 AM.  Sleep onset time was 12.9 minutes and the sleep efficiency was 56.7%%. The total sleep time was 220 minutes.  The patient spent 6.4%% of the night in stage N1 sleep, 72.3%% in stage N2 sleep, 2.0%% in stage N3 and 19.3% in REM.Stage REM latency was 218.5 minutes  Wake after sleep onset was 155.2. Alpha intrusion was absent. Supine sleep was 35.72%.  CARDIAC DATA The 2 lead EKG demonstrated sinus rhythm. The mean heart  rate was 46.3 beats per minute. Other EKG findings include: None.  LEG MOVEMENT DATA The total Periodic Limb Movements of Sleep (PLMS) were 0. The PLMS index was 0.0. A PLMS index of <15 is considered normal in adults.  IMPRESSIONS - The optimal PAP pressure was 10 cm of water. - Moderate oxygen desaturations were observed during this titration (min O2 = 86.0%). - No snoring was audible during this study. - No cardiac abnormalities were observed during this study. - Clinically significant periodic limb movements were not noted during this study. Arousals associated with PLMs were significant.   DIAGNOSIS - Obstructive Sleep Apnea (G47.33) - Significant limb movments with arousals.   RECOMMENDATIONS - Trial of CPAP therapy on 10 cm H2O with a Small-Medium size Fisher&Paykel Full Face Mask Evora Full Mask mask and heated humidification. - Avoid alcohol, sedatives and other CNS depressants that may worsen sleep apnea and disrupt normal sleep architecture. - Sleep hygiene should be reviewed to assess factors that may improve sleep quality. - Weight management and regular exercise should be initiated or continued. - Consider evaluation for restless legs syndrome - Return to Sleep Center for re-evaluation after 4 weeks of therapy   Cyril Mourning MD Board Certified in Sleep medicine

## 2021-02-21 NOTE — Telephone Encounter (Signed)
Titration study >> CPAP 10 cm seemed adequate, no oxygen was required No change to settings

## 2021-02-22 DIAGNOSIS — M47816 Spondylosis without myelopathy or radiculopathy, lumbar region: Secondary | ICD-10-CM | POA: Diagnosis not present

## 2021-02-22 DIAGNOSIS — M461 Sacroiliitis, not elsewhere classified: Secondary | ICD-10-CM | POA: Diagnosis not present

## 2021-02-22 DIAGNOSIS — M4126 Other idiopathic scoliosis, lumbar region: Secondary | ICD-10-CM | POA: Diagnosis not present

## 2021-02-27 NOTE — Telephone Encounter (Signed)
Spoke with pt and notified of results per Dr. Alva. Pt verbalized understanding and denied any questions. 

## 2021-03-01 DIAGNOSIS — H903 Sensorineural hearing loss, bilateral: Secondary | ICD-10-CM | POA: Diagnosis not present

## 2021-03-01 DIAGNOSIS — H8111 Benign paroxysmal vertigo, right ear: Secondary | ICD-10-CM | POA: Diagnosis not present

## 2021-03-01 DIAGNOSIS — Z974 Presence of external hearing-aid: Secondary | ICD-10-CM | POA: Diagnosis not present

## 2021-03-14 DIAGNOSIS — H25813 Combined forms of age-related cataract, bilateral: Secondary | ICD-10-CM | POA: Diagnosis not present

## 2021-03-14 DIAGNOSIS — H40003 Preglaucoma, unspecified, bilateral: Secondary | ICD-10-CM | POA: Diagnosis not present

## 2021-03-14 DIAGNOSIS — H353131 Nonexudative age-related macular degeneration, bilateral, early dry stage: Secondary | ICD-10-CM | POA: Diagnosis not present

## 2021-03-14 DIAGNOSIS — H35373 Puckering of macula, bilateral: Secondary | ICD-10-CM | POA: Diagnosis not present

## 2021-03-14 DIAGNOSIS — H35453 Secondary pigmentary degeneration, bilateral: Secondary | ICD-10-CM | POA: Diagnosis not present

## 2021-03-14 DIAGNOSIS — H35363 Drusen (degenerative) of macula, bilateral: Secondary | ICD-10-CM | POA: Diagnosis not present

## 2021-03-26 DIAGNOSIS — G43719 Chronic migraine without aura, intractable, without status migrainosus: Secondary | ICD-10-CM | POA: Diagnosis not present

## 2021-04-01 ENCOUNTER — Encounter: Payer: Self-pay | Admitting: Cardiovascular Disease

## 2021-04-01 NOTE — Progress Notes (Signed)
could go okay  Cardiology Office Note   Date:  04/02/2021   ID:  [name], DOB 1950-09-21, MRN [id number]  PCP:  Shon Baton, MD  Cardiologist:   Mertie Moores, MD   Chief Complaint  Patient presents with   Congestive Heart Failure        1. Atrial fibrillation 2. Hyperlipidemia 3. Diastolic congestive heart failure  71 year old female with a history of atrial fibrillation. She's been well controlled on Tikosyn. She also has a history of hyperlipidemia and is on Lipitor. She's been on chronic Pradaxa therapy for anticoagulation.  February 20, 2012 [name]  has done well since I last saw her. She retired from the [employer] [place] system last summer. She has occasional palpitations but overall she's doing quite well.  She is exercising ( water aerobics) twice a week.  Feb. 24, 2015:  [name] is doing well.   No problems.  Maintaining NSR.  Sept. 16, 2015:  She was in the ER this past week.  CP, dyspnea .. CT angio was negative for PE - showed ? Of pneumonia.   Was treated wit Abx. Does well in the am's .[name] and perhaps more short of breath in the afternoons.   April 05, 2014: [name] is a 71 y.o. female who presents for follow-up of her atrial fibrillation. She has continued to have some DOE,  Is concerned about the atorvastatin. Was seen in the lipid clinic.    Sept. 30, 2016:  Doing well.  No CP , no dyspnea. Has lost 11 lbs since last year.  Is in NSR  April 10, 2015:  [name] is doing ok from a cardiology standpoint. Has had some dental issues   Sept.  22, 2017:  [name] is seen for follow up of her atrial fib.    No  CP or dyspnea. No further episodes of atrial fib.   Dec. 11, 2017:  [name] is seen today for followup for her atrial fib  Has some DOE  Not exercising as much as she needs to  No CP   Jan. 16, 2018: [name] is seen today as a work in visit at the request of Dr. Virgina Jock. Been having more shortness of breath  and was thought to have worsening congestive heart failure.  Has severe DOE with any exertion. We performed a cardiac cath in 2008 which revealed normal coronaries.   She went to [facility] and had a heart biopsy.   Was not told of any abnormality.  Does have dull chest pain with exertion and with sitting   She had an echocardiogram performed yesterday which revealed normal left ventricular systolic function. She does have grade 2 diastolic dysfunction. Her Lasix was increased last week.   She has not noticed any increase in urine output.  No change in her breathing .  Has noticed that she loses her breath if she does any exertion .  Cannot lay supine - has severe dyspnea.   April 02, 2016:  [name] is seen today for follow up visit .    Seen with husband , Jenny Reichmann .  She had an episode of PAF yesterday  - lasted for several hours.   Is back in NSR currently .  Had an episode in Feb. That lasted 2-3 hours , HR of 129 ,  She was very short of breath with that.   -Sleep study on Feb. 20 shows mild OSA -PFTs 01/2016 showed ratio 72, FVC 74%, FEV1 69%, TLC  91% and DLCO 73% suggesting moderate intraparenchymal restriction  - echo shows normal LV systolic function with grade 2 diastolic dysfunction. She has an estimated PA pressure of 35.  Has developed a tremor.   Is seeing a neurologist .   July 10, 2016:  Had a CPX in March , Normal functional capacity compared to sedentary patients.  Still has some vocal issues ( when she gets fatigued, she loses her voice) struggles to talk  Able to get out and work in the garden Using CPAP - may have helped some   Dec. 19, 2018:  [name] is seen today for follow up of her chronic diastolic CHF [name] was seen at [facility] earlier this year.  She had a heart catheterization which revealed normal coronary arteries.  She has had a myocardial biopsy which was reportedly normal. History of paroxysmal atrial fibrillation which seems to  be well-controlled on  Tikosyn  PFTs has shown reduction of DLCO ( 73% pred.)   Is just getting over al cold  Has continued to have issues with vertigo  Not getting out to do any exercise   July 23, 2017:  Doing well,  No CP or dyspnea. Gets short of breath with the heat.  Started getting some exercise, yoga .  No cardiac exercise  Is having some joint issues,     February 02, 2018: He is seen today for follow-up visit.  She has a history of atrial fibrillation. She remains in normal sinus rhythm on Tikosyn.   She had an episode of paroxysmal atrial fibrillation this past Thursday.  The episode lasted for about 3 hours.   She took 1 of [name]'s propranolol tablets and went back into sinus rhythm fairly quickly.  Her QT intervals have remained good.  Sept. 15, 2020   [name] is seen today . caregiving for [name]'s sister.  Wt. Today is  222 ( previous wt from Jan. 2020 was 241 lbs)   Walking some , eating fewer carbs.   April 13, 2019  [name] is seen today for follow up of her atrial fib and chronic diastilic CHF Wt is 123456 lbs.  ( up 3 lbs )  Has chronic diastolic CHF Had a spinal injection last week..   Still has back and leg pain with walking  Has not been able to exercise.     Nov. 16, 2021 [name] is seen today for follow up of her chronic CHF, PAF  Wt today is 225 lbs.   Had an episode of PAF several weeks ago.   Lasted 2 days HR was 130s  Made her fatigued.  Has been walking ,  Had lost some weight, now has gained it back  Her Afib started while she was walking up her driveway  Is on xarelto   August 03, 2020: [name] is seen today for follow-up of her chronic congestive heart failure, paroxysmal atrial fibrillation.  Her weight today is211 lbs.   She was seen by Laurann Montana, NP for worsening shortness of breath.  She has been on Tikosyn for her atrial fibrillation. Her Lasix was increased to 80 mg a day for 3 days then she resumed 40 mg p.o. twice daily.  She was  last seen in A. fib clinic in March, 2022.  She had been maintaining sinus rhythm at that time.  Is not able to do much due to severe DOE . Loses her breath with any activity .  Does an activity , then has to  sit down Does another activity , then has to sit down  Walks the dog for a few minutes - perhaps 30 mintues.  Had CPX in 2018 ;-   Normal coronary CTA in Feb. 2020 Coronary calcium score is 0   She had a cardiopulmonary stress test in 2018.  It revealed normal functional capacity.  She also had pulmonary function test around 2018.  It revealed mildly reduced diffusion capacity  Voice is normal when she is rested,  has a very difficult time speaking when she is fatigued.  Normal TSH in June, 2022  Echo Jan. 2022:   EF 60-65%.,  Grade 2 diastolic dysfunction. Mild mitral regurgitation.  Saw Laurann Montana , NP on last June.  Trial of acid blockers to see if that would help with her hoarse voice No benefit according to [facility]  April 02, 2021 [name] is seen today for follow up of her chronic diastolic CHF Has mild MR She has a hx of episodic dyspnea and loses her voice regularly  I have encouraged her to discuss this with Dr Virgina Jock and a neurologist  Had a sleep study.  Now has a different CPAP mask ( sees Dr. Elsworth Soho )  [facility] is 201 lbs .   Wt Readings from Last 3 Encounters:  04/02/21 201 lb (91.2 kg)  02/15/21 201 lb (91.2 kg)  01/04/21 207 lb 3.2 oz (94 kg)      Past Medical History:  Diagnosis Date   Atrial fibrillation (HCC)    Back pain    nerve problems-had series of three injections   Bone spur    Spine   Chronic anticoagulation    Diastolic congestive heart failure (HCC)    Hearing loss    History of shingles    Hyperlipemia    Migraine headache    Obstructive sleep apnea    uses CPAP   OSA on CPAP    Paroxysmal atrial fibrillation (HCC)    Restrictive cardiomyopathy ([place])    Tremor    Tubal pregnancy     Past Surgical History:  Procedure Laterality  Date   BREAST EXCISIONAL BIOPSY Left    benign   CARDIAC CATHETERIZATION     CARDIOVERSION     CHOLECYSTECTOMY     ECTOPIC PREGNANCY SURGERY  1980   EYE SURGERY     left eye injections x2-was having "stroke like" flashes of light   heart biopsy  2008   [facility]   HYSTEROSCOPY  6/05   and D&C-simple hyperplasia secondary PMP bleeding   ROTATOR CUFF REPAIR     bilateral     Current Outpatient Medications  Medication Sig Dispense Refill   albuterol (VENTOLIN HFA) 108 (90 Base) MCG/ACT inhaler Inhale 2 puffs into the lungs every 6 (six) hours as needed for wheezing or shortness of breath. 18 g 12   atorvastatin (LIPITOR) 20 MG tablet TAKE 1 TABLET BY MOUTH DAILY AT 6PM 90 tablet 1   calcium-vitamin D (OSCAL WITH D) 500-200 MG-UNIT tablet Take 1 tablet by mouth. 104mcg     dofetilide (TIKOSYN) 125 MCG capsule TAKE 3 CAPSULES (375 MCG TOTAL) BY MOUTH 2 (TWO) TIMES DAILY. 540 capsule 2   ezetimibe (ZETIA) 10 MG tablet TAKE 1 TABLET EVERY DAY 90 tablet 3   furosemide (LASIX) 40 MG tablet Take 1 tablet (40 mg total) by mouth daily. 90 tablet 2   gabapentin (NEURONTIN) 600 MG tablet Take 900 mg by mouth at bedtime.     LIDOCAINE-MENTHOL, SPRAY, EX Apply  topically.     Misc Natural Products (ECHINACEA GOLDENSEAL PLUS PO) take during the winter months     Multiple Vitamins-Minerals (ICAPS AREDS 2) CAPS Take 1 capsule by mouth 2 (two) times daily.     potassium chloride SA (KLOR-CON) 20 MEQ tablet Take 1 tablet (20 mEq total) by mouth daily. 90 tablet 3   propranolol (INDERAL) 10 MG tablet Take 1 tablet (10 mg total) by mouth 4 (four) times daily as needed (for a-fib/palpitations).     spironolactone (ALDACTONE) 25 MG tablet Take 1 tablet (25 mg total) by mouth daily. 90 tablet 2   XARELTO 20 MG TABS tablet TAKE 1 TABLET BY MOUTH DAILY WITH SUPPER 90 tablet 1   No current facility-administered medications for this visit.    Allergies:   Topamax [topiramate], Codeine, Oxycodone, Tramadol, and  Penicillins    Social History:  The patient  reports that she has never smoked. She has never used smokeless tobacco. She reports that she does not currently use alcohol. She reports that she does not use drugs.   Family History:  [name] family history includes Atrial fibrillation in her mother and sister; Breast cancer in her sister; Cancer in her maternal grandmother and maternal uncle; Dementia in her father and sister; Heart disease in her sister; Hypertension in her father; Seizures in her father; Thyroid cancer in her sister.    ROS:   As noted in current hx. Otherwise negative    Physical Exam: Blood pressure 122/74, pulse (!) 58, height 5' 9.5" (1.765 m), weight 201 lb (91.2 kg), last menstrual period 01/21/1990, SpO2 99 %.  GEN:  Well nourished, well developed in no acute distress HEENT: Normal NECK: No JVD; No carotid bruits LYMPHATICS: No lymphadenopathy CARDIAC: RRR , no murmurs, rubs, gallops RESPIRATORY:  Clear to auscultation without rales, wheezing or rhonchi  ABDOMEN: Soft, non-tender, non-distended MUSCULOSKELETAL:  No edema; No deformity  SKIN: Warm and dry NEUROLOGIC:  Alert and oriented x 3   EKG: April 02, 2021: Sinus bradycardia 58.  No ST or T wave changes.  Recent Labs: 07/14/2020: NT-Pro BNP 237; TSH 1.020 08/03/2020: Magnesium 2.4 10/10/2020: BUN 16; Creatinine, Ser 0.91; Potassium 4.4; Sodium 141    Lipid Panel    Component Value Date/Time   CHOL 134 07/23/2017 0847   TRIG 153 (H) 07/23/2017 0847   HDL 40 07/23/2017 0847   CHOLHDL 3.4 07/23/2017 0847   CHOLHDL 4 10/22/2013 1501   VLDL 32.6 10/22/2013 1501   LDLCALC 63 07/23/2017 0847      Wt Readings from Last 3 Encounters:  04/02/21 201 lb (91.2 kg)  02/15/21 201 lb (91.2 kg)  01/04/21 207 lb 3.2 oz (94 kg)      Other studies Reviewed: Additional studies/ records that were reviewed today include: . Review of the above records demonstrates:    ASSESSMENT AND PLAN:  1. Atrial  fibrillation -  remains in NSR  Has has 2 episodes of Afib.  Takes propranolol 10 mg as needed.     2. Hyperlipidemia-managed by dr. Timothy Lasso   3.  Chest discomfort:  :  no CP   4. Diastolic congestive heart failure -  seems stable   Advised more exercise as tolerated.   5.  Mild TR :     6.  Shortness of breath:   still an issue    Current medicines are reviewed at length with the patient today.  The patient does not have concerns regarding medicines.  The following changes  have been made:  See above.   Labs/ tests ordered today include:   Orders Placed This Encounter  Procedures   EKG 12-Lead     Disposition:       Mertie Moores, MD  04/02/2021 [facility] [place], [place], [place]  [address] Phone: [phone]; Fax: [phone]

## 2021-04-02 ENCOUNTER — Encounter: Payer: Self-pay | Admitting: Cardiovascular Disease

## 2021-04-02 ENCOUNTER — Ambulatory Visit: Payer: Medicare PPO | Admitting: Cardiovascular Disease

## 2021-04-02 ENCOUNTER — Other Ambulatory Visit: Payer: Self-pay

## 2021-04-02 VITALS — BP 122/74 | HR 58 | Ht 69.5 in | Wt 201.0 lb

## 2021-04-02 DIAGNOSIS — I480 Paroxysmal atrial fibrillation: Secondary | ICD-10-CM

## 2021-04-02 DIAGNOSIS — I5032 Chronic diastolic (congestive) heart failure: Secondary | ICD-10-CM | POA: Diagnosis not present

## 2021-04-02 MED ORDER — PROPRANOLOL HCL 10 MG PO TABS: 10.0000 mg | ORAL_TABLET | Freq: Four times a day (QID) | ORAL | Status: DC | PRN

## 2021-04-02 NOTE — Patient Instructions (Signed)
Medication Instructions:  ?Your physician has recommended you make the following change in your medication: ? ?1) CHANGE Propranolol 10mg  four times daily as needed for a-fib/palpitations ? ?*If you need a refill on your cardiac medications before your next appointment, please call your pharmacy* ? ?Lab Work: ?NONE   ?If you have labs (blood work) drawn today and your tests are completely normal, you will receive your results only by: ?MyChart Message (if you have MyChart) OR ?A paper copy in the mail ?If you have any lab test that is abnormal or we need to change your treatment, we will call you to review the results. ? ?Testing/Procedures: ?NONE ? ?Follow-Up: ?At [facility], you and your health needs are our priority.  As part of our continuing mission to provide you with exceptional heart care, we have created designated Provider Care Teams.  These Care Teams include your primary Cardiologist (physician) and Advanced Practice Providers (APPs -  Physician Assistants and Nurse Practitioners) who all work together to provide you with the care you need, when you need it. ? ?Your next appointment:   ?6 month(s) ? ?The format for your next appointment:   ?In Person ? ?Provider:   ?[facility], PA-C or Chelsea Aus, PA-C ?

## 2021-04-04 ENCOUNTER — Encounter: Payer: Self-pay | Admitting: Pulmonary Disease

## 2021-04-04 ENCOUNTER — Ambulatory Visit: Payer: Medicare PPO | Admitting: Pulmonary Disease

## 2021-04-04 ENCOUNTER — Other Ambulatory Visit: Payer: Self-pay | Admitting: Cardiovascular Disease

## 2021-04-04 ENCOUNTER — Other Ambulatory Visit: Payer: Self-pay

## 2021-04-04 DIAGNOSIS — G4733 Obstructive sleep apnea (adult) (pediatric): Secondary | ICD-10-CM

## 2021-04-04 DIAGNOSIS — R0600 Dyspnea, unspecified: Secondary | ICD-10-CM

## 2021-04-04 NOTE — Assessment & Plan Note (Signed)
Persistent dyspnea and fatigue are not attributable to OSA anymore.  Etiology is unclear but does not point to any kind of pulmonary disease at this time. ?Neuromuscular cause has been entertained and investigated ?

## 2021-04-04 NOTE — Patient Instructions (Signed)
?  CPAP is working better on current settings ?CPAP supplies will be renewed x 1 year ?

## 2021-04-04 NOTE — Assessment & Plan Note (Signed)
CPAP download was reviewed which shows good control of events on 13 cm average with maximum pressure of 14 cm on auto settings 5 to 15 cm with large leak. ?Compliance is much improved more than 6 hours every night with only one last night for the last month. ?I feel that we are in a much better place in terms of comfort and tolerance, she has a better fitting mask although she still has a leak. ?We will continue on current auto settings ? ?Weight loss encouraged, compliance with goal of at least 4-6 hrs every night is the expectation. ?Advised against medications with sedative side effects ?Cautioned against driving when sleepy - understanding that sleepiness will vary on a day to day basis ? ?

## 2021-04-04 NOTE — Progress Notes (Signed)
? ?  Subjective:  ? ? Patient ID: [name], female    DOB: Jan 02, 1951, 71 y.o.   MRN: [id number] ? ?HPI ? ?71 yo never smoker, retired Psychiatrist for FU of dyspnea and Mild restriction on PFTs. ?  ?PMH -  atrial fibrillation controlled on Tikosyn. She has restrictive cardiomyopathy with negative cardiac biopsy at [facility] in the past. Essential tremor following with Neuro (neg aldolase/ck/acetylcholine recept) ? ?-constellation of symptoms including severe weight loss, unexplained dyspnea and weak voice with essential tremors.  Previous work-up for neuromuscular disease has been negative  ? ?36-month follow-up visit.  She underwent CPAP titration study which showed CPAP requirement of 10 cm.  They use a different type of fullface mask which she prefers. ?Reviewed cardiology consultation, she is undergoing audiology evaluation for decreased hearing and vertigo. ?She continues to remain tired, complains of dyspnea on exertion, fatigue and tremors.  Tremors are improved with beta-blockers. ?Husband hears a large leak on her CPAP machine.  She is sleeping more but "not resting well" ? ? ?Significant tests/ events reviewed ?CPET 03/2016 no clear indication for cardiopulmonary limitation. At peak exercise and with flat O2 pulse pattern, patient is likely limited due to diastolic dysfunction as a result of her body habitus. Expect weight loss and regular exercise would greatly improve exercise intolerance. ?  ?PFT 07/2020 very mild restrictive lung disease, normal DLCO?  Extraparenchymal ?  ?PFTs 01/2016 showed ratio 72, FVC 74%, FEV1 69%, TLC 91% and DLCO 73% suggesting moderate intraparenchymal restriction ?sniff test >> no  diaphragmatic dysfunction ?  ? ?CPAP ttiration 01/2021 >> 10 cm  ?PSG 2008 at [facility] showed total sleep time 362 minutes and a low AHI ?PSG 02/2016 showed mild OSA with AHI 8/hour ?  ?Echo 01/2020 normal LVEF grade 2 diastolic dysfunction, normal RVSP ?Echo 01/2016 showed grade 2  diastolic dysfunction with RVSP 35 ?  ?CT cors 02/2018 low risk ?CT angiogram 09/2013 was negative for pulmonary emboli ?  ?02/2016 sniff test normal diaphragmatic function ? ?Review of Systems ?neg for any significant sore throat, dysphagia, itching, sneezing, nasal congestion or excess/ purulent secretions, fever, chills, sweats, unintended wt loss, pleuritic or exertional cp, hempoptysis, orthopnea pnd or change in chronic leg swelling. Also denies presyncope, palpitations, heartburn, abdominal pain, nausea, vomiting, diarrhea or change in bowel or urinary habits, dysuria,hematuria, rash, arthralgias, visual complaints, headache, numbness weakness or ataxia. ? ?   ?Objective:  ? Physical Exam ? ?Gen. Pleasant, obese, in no distress ?ENT - no lesions, no post nasal drip ?Neck: No JVD, no thyromegaly, no carotid bruits ?Lungs: no use of accessory muscles, no dullness to percussion, decreased without rales or rhonchi  ?Cardiovascular: Rhythm regular, heart sounds  normal, no murmurs or gallops, no peripheral edema ?Musculoskeletal: No deformities, no cyanosis or clubbing ,  tremors + ? ? ? ?   ?Assessment & Plan:  ? ? ?

## 2021-04-05 ENCOUNTER — Other Ambulatory Visit: Payer: Self-pay

## 2021-04-05 MED ORDER — DOFETILIDE 125 MCG PO CAPS: 375.0000 ug | ORAL_CAPSULE | Freq: Two times a day (BID) | ORAL | 3 refills | Status: DC

## 2021-04-05 NOTE — Telephone Encounter (Signed)
Called pt and pt stated that she would like for her medication to be sent to [facility] mail order pharmacy. Medication sent, Confirmation received.  ?

## 2021-04-06 DIAGNOSIS — H903 Sensorineural hearing loss, bilateral: Secondary | ICD-10-CM | POA: Diagnosis not present

## 2021-04-06 DIAGNOSIS — H8111 Benign paroxysmal vertigo, right ear: Secondary | ICD-10-CM | POA: Diagnosis not present

## 2021-04-06 DIAGNOSIS — Z974 Presence of external hearing-aid: Secondary | ICD-10-CM | POA: Diagnosis not present

## 2021-04-06 DIAGNOSIS — H9313 Tinnitus, bilateral: Secondary | ICD-10-CM | POA: Diagnosis not present

## 2021-04-06 DIAGNOSIS — I4891 Unspecified atrial fibrillation: Secondary | ICD-10-CM | POA: Diagnosis not present

## 2021-04-06 DIAGNOSIS — I10 Essential (primary) hypertension: Secondary | ICD-10-CM | POA: Diagnosis not present

## 2021-04-06 DIAGNOSIS — G43909 Migraine, unspecified, not intractable, without status migrainosus: Secondary | ICD-10-CM | POA: Diagnosis not present

## 2021-04-06 DIAGNOSIS — R634 Abnormal weight loss: Secondary | ICD-10-CM | POA: Diagnosis not present

## 2021-04-16 ENCOUNTER — Emergency Department (HOSPITAL_COMMUNITY): Payer: Medicare PPO

## 2021-04-16 ENCOUNTER — Observation Stay (HOSPITAL_COMMUNITY)
Admission: EM | Admit: 2021-04-16 | Discharge: 2021-04-17 | Disposition: A | Discharge status: Home / Self Care | Payer: Medicare PPO | Attending: Family Medicine | Admitting: Family Medicine

## 2021-04-16 ENCOUNTER — Other Ambulatory Visit: Payer: Self-pay

## 2021-04-16 DIAGNOSIS — I480 Paroxysmal atrial fibrillation: Secondary | ICD-10-CM | POA: Diagnosis not present

## 2021-04-16 DIAGNOSIS — Z79899 Other long term (current) drug therapy: Secondary | ICD-10-CM | POA: Diagnosis not present

## 2021-04-16 DIAGNOSIS — N1831 Chronic kidney disease, stage 3a: Secondary | ICD-10-CM | POA: Diagnosis not present

## 2021-04-16 DIAGNOSIS — I429 Cardiomyopathy, unspecified: Secondary | ICD-10-CM

## 2021-04-16 DIAGNOSIS — Y92002 Bathroom of unspecified non-institutional (private) residence single-family (private) house as the place of occurrence of the external cause: Secondary | ICD-10-CM | POA: Insufficient documentation

## 2021-04-16 DIAGNOSIS — S0181XA Laceration without foreign body of other part of head, initial encounter: Secondary | ICD-10-CM | POA: Diagnosis not present

## 2021-04-16 DIAGNOSIS — J320 Chronic maxillary sinusitis: Secondary | ICD-10-CM | POA: Diagnosis not present

## 2021-04-16 DIAGNOSIS — G4489 Other headache syndrome: Secondary | ICD-10-CM | POA: Diagnosis not present

## 2021-04-16 DIAGNOSIS — S01112A Laceration without foreign body of left eyelid and periocular area, initial encounter: Principal | ICD-10-CM | POA: Insufficient documentation

## 2021-04-16 DIAGNOSIS — R55 Syncope and collapse: Secondary | ICD-10-CM

## 2021-04-16 DIAGNOSIS — I5030 Unspecified diastolic (congestive) heart failure: Secondary | ICD-10-CM | POA: Diagnosis not present

## 2021-04-16 DIAGNOSIS — R079 Chest pain, unspecified: Secondary | ICD-10-CM | POA: Diagnosis not present

## 2021-04-16 DIAGNOSIS — S0990XA Unspecified injury of head, initial encounter: Secondary | ICD-10-CM | POA: Diagnosis not present

## 2021-04-16 DIAGNOSIS — Z7901 Long term (current) use of anticoagulants: Secondary | ICD-10-CM | POA: Insufficient documentation

## 2021-04-16 DIAGNOSIS — I4891 Unspecified atrial fibrillation: Secondary | ICD-10-CM | POA: Diagnosis present

## 2021-04-16 DIAGNOSIS — W0110XA Fall on same level from slipping, tripping and stumbling with subsequent striking against unspecified object, initial encounter: Secondary | ICD-10-CM | POA: Insufficient documentation

## 2021-04-16 DIAGNOSIS — S0993XA Unspecified injury of face, initial encounter: Secondary | ICD-10-CM | POA: Diagnosis not present

## 2021-04-16 DIAGNOSIS — M852 Hyperostosis of skull: Secondary | ICD-10-CM | POA: Diagnosis not present

## 2021-04-16 DIAGNOSIS — W19XXXA Unspecified fall, initial encounter: Secondary | ICD-10-CM | POA: Diagnosis not present

## 2021-04-16 DIAGNOSIS — R0789 Other chest pain: Secondary | ICD-10-CM | POA: Diagnosis not present

## 2021-04-16 DIAGNOSIS — I4819 Other persistent atrial fibrillation: Secondary | ICD-10-CM | POA: Diagnosis present

## 2021-04-16 LAB — TROPONIN I (HIGH SENSITIVITY)
Troponin I (High Sensitivity): 9 ng/L (ref <18)
Troponin I (High Sensitivity): 9 ng/L (ref <18)

## 2021-04-16 LAB — CBC WITH DIFFERENTIAL/PLATELET
Abs Immature Granulocytes: 0.04 10*3/uL (ref 0.00–0.07)
Basophils Absolute: 0 10*3/uL (ref 0.0–0.1)
Basophils Relative: 0 %
Eosinophils Absolute: 0.1 10*3/uL (ref 0.0–0.5)
Eosinophils Relative: 1 %
HCT: 40.8 % (ref 36.0–46.0)
Hemoglobin: 13.3 g/dL (ref 12.0–15.0)
Immature Granulocytes: 0 %
Lymphocytes Relative: 14 %
Lymphs Abs: 1.6 10*3/uL (ref 0.7–4.0)
MCH: 29 pg (ref 26.0–34.0)
MCHC: 32.6 g/dL (ref 30.0–36.0)
MCV: 89.1 fL (ref 80.0–100.0)
Monocytes Absolute: 0.6 10*3/uL (ref 0.1–1.0)
Monocytes Relative: 5 %
Neutro Abs: 8.8 10*3/uL — ABNORMAL HIGH (ref 1.7–7.7)
Neutrophils Relative %: 80 %
Platelets: 243 10*3/uL (ref 150–400)
RBC: 4.58 MIL/uL (ref 3.87–5.11)
RDW: 14.3 % (ref 11.5–15.5)
WBC: 11.2 10*3/uL — ABNORMAL HIGH (ref 4.0–10.5)
nRBC: 0 % (ref 0.0–0.2)

## 2021-04-16 LAB — COMPREHENSIVE METABOLIC PANEL
ALT: 11 U/L (ref 0–44)
AST: 21 U/L (ref 15–41)
Albumin: 3.2 g/dL — ABNORMAL LOW (ref 3.5–5.0)
Alkaline Phosphatase: 91 U/L (ref 38–126)
Anion gap: 7 (ref 5–15)
BUN: 15 mg/dL (ref 8–23)
CO2: 25 mmol/L (ref 22–32)
Calcium: 9 mg/dL (ref 8.9–10.3)
Chloride: 108 mmol/L (ref 98–111)
Creatinine, Ser: 0.79 mg/dL (ref 0.44–1.00)
GFR, Estimated: >60 mL/min (ref >60)
Glucose, Bld: 107 mg/dL — ABNORMAL HIGH (ref 70–99)
Potassium: 4.1 mmol/L (ref 3.5–5.1)
Sodium: 140 mmol/L (ref 135–145)
Total Bilirubin: 0.5 mg/dL (ref 0.3–1.2)
Total Protein: 5.9 g/dL — ABNORMAL LOW (ref 6.5–8.1)

## 2021-04-16 LAB — TSH: TSH: 1.469 u[IU]/mL (ref 0.350–4.500)

## 2021-04-16 LAB — MAGNESIUM: Magnesium: 2.3 mg/dL (ref 1.7–2.4)

## 2021-04-16 LAB — HIV ANTIBODY (ROUTINE TESTING W REFLEX): HIV Screen 4th Generation wRfx: NONREACTIVE

## 2021-04-16 MED ORDER — ALBUTEROL SULFATE (2.5 MG/3ML) 0.083% IN NEBU: 2.5000 mg | INHALATION_SOLUTION | Freq: Four times a day (QID) | RESPIRATORY_TRACT | Status: DC | PRN

## 2021-04-16 MED ORDER — RIVAROXABAN 10 MG PO TABS: 20.0000 mg | ORAL_TABLET | Freq: Every day | ORAL | Status: DC

## 2021-04-16 MED ORDER — HEPARIN SODIUM (PORCINE) 5000 UNIT/ML IJ SOLN
5000.0000 [IU] | Freq: Three times a day (TID) | INTRAMUSCULAR | Status: DC
  Administered 2021-04-16 – 2021-04-17 (×2): 5000 [IU] via SUBCUTANEOUS
  Filled 2021-04-16 (×2): qty 1

## 2021-04-16 MED ORDER — POLYVINYL ALCOHOL 1.4 % OP SOLN
1.0000 [drp] | Freq: Every day | OPHTHALMIC | Status: DC | PRN
Start: 2021-04-16 — End: 2021-04-18
  Filled 2021-04-16: qty 15

## 2021-04-16 MED ORDER — ACETAMINOPHEN 650 MG RE SUPP: 650.0000 mg | Freq: Four times a day (QID) | RECTAL | Status: DC | PRN

## 2021-04-16 MED ORDER — SODIUM CHLORIDE 0.9 % IV BOLUS
500.0000 mL | Freq: Once | INTRAVENOUS | Status: AC
  Administered 2021-04-16: 500 mL via INTRAVENOUS

## 2021-04-16 MED ORDER — METOPROLOL TARTRATE 5 MG/5ML IV SOLN: 2.5000 mg | Freq: Four times a day (QID) | INTRAVENOUS | Status: DC | PRN

## 2021-04-16 MED ORDER — LIDOCAINE HCL (PF) 1 % IJ SOLN
5.0000 mL | Freq: Once | INTRAMUSCULAR | Status: AC
  Administered 2021-04-16: 5 mL
  Filled 2021-04-16: qty 5

## 2021-04-16 MED ORDER — METOCLOPRAMIDE HCL 5 MG PO TABS: 10.0000 mg | ORAL_TABLET | Freq: Four times a day (QID) | ORAL | Status: DC | PRN

## 2021-04-16 MED ORDER — DOFETILIDE 125 MCG PO CAPS
375.0000 ug | ORAL_CAPSULE | Freq: Two times a day (BID) | ORAL | Status: DC
  Administered 2021-04-16 – 2021-04-17 (×2): 375 ug via ORAL
  Filled 2021-04-16 (×3): qty 3

## 2021-04-16 MED ORDER — ACETAMINOPHEN 325 MG PO TABS: 650.0000 mg | ORAL_TABLET | Freq: Four times a day (QID) | ORAL | Status: DC | PRN

## 2021-04-16 MED ORDER — SODIUM CHLORIDE 0.9% FLUSH: 3.0000 mL | Freq: Two times a day (BID) | INTRAVENOUS | Status: DC

## 2021-04-16 MED ORDER — EZETIMIBE 10 MG PO TABS
10.0000 mg | ORAL_TABLET | Freq: Every day | ORAL | Status: DC
  Administered 2021-04-17: 10 mg via ORAL
  Filled 2021-04-16: qty 1

## 2021-04-16 MED ORDER — SODIUM CHLORIDE 0.9 % IV SOLN: INTRAVENOUS | Status: DC

## 2021-04-16 MED ORDER — GABAPENTIN 300 MG PO CAPS
900.0000 mg | ORAL_CAPSULE | Freq: Every day | ORAL | Status: DC
  Administered 2021-04-16: 900 mg via ORAL
  Filled 2021-04-16: qty 3

## 2021-04-16 MED ORDER — ATORVASTATIN CALCIUM 10 MG PO TABS
20.0000 mg | ORAL_TABLET | Freq: Every day | ORAL | Status: DC
  Administered 2021-04-16 – 2021-04-17 (×2): 20 mg via ORAL
  Filled 2021-04-16 (×2): qty 2

## 2021-04-16 NOTE — ED Triage Notes (Signed)
Pt BIB [facility] from home after fall in bathroom on thinners. Pt on thinners for afib A&Ox4 at this time ?

## 2021-04-16 NOTE — ED Provider Notes (Signed)
?[facility] EMERGENCY DEPARTMENT ?Provider Note ? ? ?CSN: [id number] ?Arrival date & time: 04/16/21  1123 ? ?  ? ?History ? ?Chief Complaint  ?Patient presents with  ? Fall  ? ? ?[name] is a 71 y.o. female. ? ? ?Fall ?Pertinent negatives include no shortness of breath. Patient presents after syncope and fall.  Was at home.  Has been feeling a little bad since yesterday.  Is in atrial fibrillation.  Came in as a level 2 trauma due to fall with head injury on anticoagulation.  Hit head.  Patient states she was out briefly but her husband was out for couple minutes.  Laceration near her eye.  No vision changes.  Does have a history of A-fib that will somewhat come and go.  States she thinks she went into it yesterday.  It has been in and out of it before these last 2 days however. ? ?  ? ?Home Medications ?Prior to Admission medications   ?Medication Sig Start Date End Date Taking? Authorizing Provider  ?albuterol (VENTOLIN HFA) 108 (90 Base) MCG/ACT inhaler Inhale 2 puffs into the lungs every 6 (six) hours as needed for wheezing or shortness of breath. 08/11/19  Yes Oretha Milch, MD  ?atorvastatin (LIPITOR) 20 MG tablet TAKE 1 TABLET BY MOUTH DAILY AT 6PM ?Patient taking differently: Take 20 mg by mouth daily. 02/19/21  Yes Nahser, Deloris Ping, MD  ?calcium-vitamin D (OSCAL WITH D) 500-200 MG-UNIT tablet Take 1 tablet by mouth.   Yes [provider]  ?dofetilide (TIKOSYN) 125 MCG capsule Take 3 capsules (375 mcg total) by mouth 2 (two) times daily. 04/05/21  Yes Nahser, Deloris Ping, MD  ?ezetimibe (ZETIA) 10 MG tablet TAKE 1 TABLET EVERY DAY ?Patient taking differently: Take 10 mg by mouth daily. 04/26/20  Yes Nahser, Deloris Ping, MD  ?furosemide (LASIX) 40 MG tablet Take 1 tablet (40 mg total) by mouth daily. 01/08/21  Yes Nahser, Deloris Ping, MD  ?gabapentin (NEURONTIN) 600 MG tablet Take 900 mg by mouth at bedtime.   Yes [provider]  ?metoCLOPramide (REGLAN) 10 MG tablet  Take 10 mg by mouth 4 (four) times daily as needed for nausea.   Yes [provider]  ?Multiple Vitamins-Minerals (PRESERVISION AREDS 2 PO) Take 1 capsule by mouth 2 (two) times daily.   Yes [provider]  ?Polyethyl Glycol-Propyl Glycol (SYSTANE OP) Apply 1 drop to eye daily as needed (dry eyes).   Yes [provider]  ?potassium chloride SA (KLOR-CON) 20 MEQ tablet Take 1 tablet (20 mEq total) by mouth daily. 10/10/20  Yes Dyann Kief, PA-C  ?propranolol (INDERAL) 10 MG tablet Take 1 tablet (10 mg total) by mouth 4 (four) times daily as needed (for a-fib/palpitations). ?Patient taking differently: Take 10 mg by mouth See admin instructions. Take 10mg  twice daily, and an additional 10mg  as needed for a-fib/palpitations 04/02/21  Yes Nahser, , MD  ?spironolactone (ALDACTONE) 25 MG tablet Take 1 tablet (25 mg total) by mouth daily. 02/01/20  Yes Nahser, Deloris Ping, MD  ?03/31/20 20 MG TABS tablet TAKE 1 TABLET BY MOUTH DAILY WITH SUPPER ?Patient taking differently: Take 20 mg by mouth daily. 02/19/21  Yes Nahser, Carlena Hurl, MD  ?   ? ?Allergies    ?Oxycodone, Topamax [topiramate], Codeine, Penicillins, and Tramadol   ? ?Review of Systems   ?Review of Systems  ?Constitutional:  Negative for appetite change.  ?Respiratory:  Negative for shortness of breath.   ? ?  Physical Exam ?Updated Vital Signs ?BP 103/61   Pulse 78   Temp (!) 97.4 ?F (36.3 ?C) (Oral)   Resp 14   Ht 5' 9.5" (1.765 m)   Wt 90.5 kg   LMP 01/21/1990   SpO2 100%   BMI 29.04 kg/m?  ?Physical Exam ?HENT:  ?   Head:  ?   Comments: Laceration just lateral to the left eye near the corner of the lids.  Approximately 6 mm long.  Does not actually involve the edge of the lid.  Eye movements intact.  No hyphema. ?Eyes:  ?   Pupils: Pupils are equal, round, and reactive to light.  ?Cardiovascular:  ?   Rate and Rhythm: Normal rate. Rhythm irregular.  ?Pulmonary:  ?   Effort: Pulmonary effort is normal.  ?Abdominal:  ?    Tenderness: There is no abdominal tenderness.  ?Musculoskeletal:     ?   General: No tenderness.  ?Skin: ?   General: Skin is warm.  ?   Capillary Refill: Capillary refill takes less than 2 seconds.  ?Neurological:  ?   Mental Status: She is alert and oriented to person, place, and time.  ? ? ?ED Results / Procedures / Treatments   ?Labs ?(all labs ordered are listed, but only abnormal results are displayed) ?Labs Reviewed  ?CBC WITH DIFFERENTIAL/PLATELET - Abnormal; Notable for the following components:  ?    Result Value  ? WBC 11.2 (*)   ? Neutro Abs 8.8 (*)   ? All other components within normal limits  ?COMPREHENSIVE METABOLIC PANEL - Abnormal; Notable for the following components:  ? Glucose, Bld 107 (*)   ? Total Protein 5.9 (*)   ? Albumin 3.2 (*)   ? All other components within normal limits  ?TROPONIN I (HIGH SENSITIVITY)  ?TROPONIN I (HIGH SENSITIVITY)  ? ? ?EKG ?EKG Interpretation ? ?Date/Time:  Monday April 16 2021 12:04:22 EDT ?Ventricular Rate:  76 ?PR Interval:    ?QRS Duration: 106 ?QT Interval:  417 ?QTC Calculation: 469 ?R Axis:   59 ?Text Interpretation: Atrial fibrillation Low voltage, precordial leads Confirmed by Benjiman Core [id number]) on 04/16/2021 12:39:10 PM ? ?Radiology ?CT HEAD WO CONTRAST ( ) ? ?Result Date: 04/16/2021 ?CLINICAL DATA:  Trauma, fall EXAM: CT HEAD WITHOUT CONTRAST TECHNIQUE: Contiguous axial images were obtained from the base of the skull through the vertex without intravenous contrast. RADIATION DOSE REDUCTION: This exam was performed according to the departmental dose-optimization program which includes automated exposure control, adjustment of the mA and/or kV according to patient size and/or use of iterative reconstruction technique. COMPARISON:  None. FINDINGS: Brain: No acute intracranial findings are seen in noncontrast CT brain. Cortical sulci are prominent. Ventricles are not dilated. There is no shift of midline structures. There are no epidural or subdural  fluid collections. Vascular: Unremarkable. Skull: No fracture is seen in the calvarium. Hyperostosis frontalis interna is seen. Sinuses/Orbits: There is calcific density in the anterior aspect of right maxillary antrum, possibly osteoma. Other: None. IMPRESSION: No acute intracranial findings are seen in noncontrast CT brain. Atrophy. Electronically Signed   By: Ernie Avena M.D.   On: 04/16/2021 12:07  ? ?DG Chest Portable 1 View ? ?Result Date: 04/16/2021 ?CLINICAL DATA:  Syncope in a 71 year old female. EXAM: PORTABLE CHEST 1 VIEW COMPARISON:  April 03, 2020. FINDINGS: EKG leads project over the patient's chest. Image rotated slightly to the RIGHT. Cardiomediastinal contours and hilar structures are stable. Lungs are clear.  No sign of effusion on frontal  radiograph. On limited assessment there is no acute skeletal process. IMPRESSION: No acute cardiopulmonary disease. Electronically Signed   By: Donzetta Kohut M.D.   On: 04/16/2021 11:53  ? ?CT Maxillofacial Wo Contrast ? ?Result Date: 04/16/2021 ?CLINICAL DATA:  Trauma EXAM: CT MAXILLOFACIAL WITHOUT CONTRAST TECHNIQUE: Multidetector CT imaging of the maxillofacial structures was performed. Multiplanar CT image reconstructions were also generated. RADIATION DOSE REDUCTION: This exam was performed according to the departmental dose-optimization program which includes automated exposure control, adjustment of the mA and/or kV according to patient size and/or use of iterative reconstruction technique. COMPARISON:  None. FINDINGS: Osseous: No displaced fractures are seen. There is 13 mm calcific density in the anterior inferior aspect of right maxillary antrum. There is dental implant projecting into this calcific density. This may be related to previous dental implant placement. Orbits: Optic globes are symmetrical. Retrobulbar soft tissues are unremarkable. Sinuses: There are no air-fluid levels or significant mucosal thickening. Soft tissues: Unremarkable.  Limited intracranial: Unremarkable. IMPRESSION: No recent fracture is seen in the facial bones. There are no air-fluid levels in the paranasal sinuses. No focal abnormality is seen in the orbits. Other findings a

## 2021-04-16 NOTE — Consult Note (Addendum)
? ?The patient has been seen in conjunction with Sande Rives, PAC. All aspects of care have been considered and discussed. The patient has been personally interviewed, examined, and all clinical data has been reviewed. ? ?Recurrent syncope 09/2020 and again this morning. Both episodes occurred in setting of Afib. Today, she used Propranolol 10 mg x 2 within 2 hours of each other along with her usual dose of furosemide and spironolactone. Additionally, she has had > 35 lb weight loss unintentionally in 6 months. She did not have nausea but felt weak and when she stood up she collapsed and sustained a laceration over the left eye. Husband confirms 45-60  seconds before she began responding.  No tonic clonic activity.  ?Orthostatic BP not recorded but was hypotensive with SPB < 100 mmHg. ?CT head - for bleed/hematoma ?ECG reveals AF with controlled rate. ?SYNCOPE: Uncertain etiology. Possibly  combination of weight loss and medications causing hypotension, +/- decreased cardiac output from AF. ?Plan: Replete IV volume. Adjust meds. Monitor for self terminating Torsades due to Tikosyn. May need EP consult. May need Loop recorder. 2 D Echo. Recor orthostatic BP's. Elecetrical cardioversion in AM if able to schedule. ? ? ? ? ? ?Cardiology Consultation:  ? ?Patient ID: [name] ?MRN: [id number]; DOB: 05-17-1950 ? ?Admit date: 04/16/2021 ?Date of Consult: 04/16/2021 ? ?PCP:  Shon Baton, MD ?  ?[facility] Prov[facility] ?Cardiologist:  Mertie Moores, MD  ? ?Patient Profile:  ? ?[name] is a 71 y.o. female with a history of normal coronaries on coronary CTA in 02/2018, paroxysmal atrial fibrillation on Tikosyn, chronic diastolic CHF, hyperlipidemia, obstructive sleep apnea on CPAP, and migraines who is being seen today for the evaluation of  syncope at the request of Dr. Roosevelt Locks. ? ?History of Present Illness:  ? ?Ms. [name] is a 71 year old female with the above history who is followed by Dr. Acie Fredrickson.  She  has been maintained on Tikosyn and Xarelto for atrial fibrillation.  Last Echo in 01/2020 showed LVEF of 60-65% with normal wall motion, grade 2 diastolic dysfunction, and mild MR.  Monitor in 09/2020 showed underlying sinus rhythm with rare episodes of atrial tachycardia but no atrial fibrillation.  Patient was recently seen by Dr. Acie Fredrickson on 04/02/2021 at which time she reported episodic dyspnea was overall stable from a cardiac standpoint.  She was maintaining sinus rhythm at that time. ? ?Patient presents to the ED today for further evaluation of syncope and fall.  Patient states she felt like she went into atrial fibrillation yesterday evening.  She reports palpitations and heart rates in the low 100s (baseline heart rates usually in the 50s).  She takes propranolol twice daily for tremors but was advised that she could take 2 additional doses throughout the day as needed for palpitations.  Therefore, she took a dose at 6 PM and then another dose at 8 PM last night.  This morning she still felt like she was in atrial fibrillation so she took a dose of propranolol around 6 AM and then another 1 around 8 AM.  She then went to the bathroom and had a bowel movement and when she got up she felt lightheaded/dizzy and short of breath. She felt like she was going to pass out and so she called for her husband but before he got there she passed out.  Husband, who is an Product/process development scientist, estimates that she was unconscious for about a minute.  When he arrived, he sternal rub to  her with little response the patient then came to on her own.  Husband also states that it sounded like she was having some agonal breathing but she never lost a pulse.  She describes some chest discomfort from the palpitations that she describes as feeling like she is running a marathon but no real chest pain.  She does describe dyspnea on exertion for the past few months but no significant shortness of breath at rest.  She has stable orthopnea.  No  PND or lower extremity edema.  She denies any weight gain and states she has actually had about 35 to 40 lbs of unintentional weight loss.  She denies any night sweats or chills and denies any abnormal bleeding.  She states she is only had one other episode of syncope and that was in 09/2020.  This occurred when her husband was about to have eye surgery.  She was at his bedside and also passed out.  Monitor at this time showed no significant arrhythmias. She also has a history of vertigo but states this was different. No other recent fevers or illnesses. ? ?Upon arrival to the ED, patient hypotensive with BP as low as 86/57. EKG shows atrial fibrillation, with no acute ST/T changes.  High-sensitivity troponin negative x2.  Shows no acute findings.  CT shows no acute intracranial findings and no fracture to facial bones. WBC 11.2, Hgb 13.3, Plts 243. Na 140, K 4.1, Glucose 107, BUN 15, Cr 0.79. Albumin 3.2, AST 21, ALT 11, Alk Phos 91. Total Bili 0.5. She had a left eye laceration that required sutures. She  was seen by Dr. Bobbye Morton and cleared from a trauma standpoint. She was started on IV fluids and Cardiology consulted for further evaluation.  ? ?At the time of this evaluation, BP still soft and patient continues to have some lightheadedness/dizziness.  ? ?Of note, she has underlying sleep apnea but has had problems with her CPAP mask not fitting of there past month. ? ?Past Medical History:  ?Diagnosis Date  ? Atrial fibrillation ([place])   ? Back pain   ? nerve problems-had series of three injections  ? Bone spur   ? Spine  ? Chronic anticoagulation   ? Diastolic congestive heart failure ([place])   ? Hearing loss   ? History of shingles   ? Hyperlipemia   ? Migraine headache   ? Obstructive sleep apnea   ? uses CPAP  ? OSA on CPAP   ? Paroxysmal atrial fibrillation ([facility])   ? Restrictive cardiomyopathy ([place])   ? Tremor   ? Tubal pregnancy   ? ? ?Past Surgical History:  ?Procedure Laterality Date  ? BREAST EXCISIONAL  BIOPSY Left   ? benign  ? CARDIAC CATHETERIZATION    ? CARDIOVERSION    ? CHOLECYSTECTOMY    ? [place]  ? EYE SURGERY    ? left eye injections x2-was having "stroke like" flashes of light  ? heart biopsy  2008  ? [facility]  ? HYSTEROSCOPY  6/05  ? and D&C-simple hyperplasia secondary PMP bleeding  ? ROTATOR CUFF REPAIR    ? bilateral  ?  ? ?Home Medications:  ?Prior to Admission medications   ?Medication Sig Start Date End Date Taking? Authorizing Provider  ?albuterol (VENTOLIN HFA) 108 (90 Base) MCG/ACT inhaler Inhale 2 puffs into the lungs every 6 (six) hours as needed for wheezing or shortness of breath. 08/11/19  Yes Rigoberto Noel, MD  ?atorvastatin (LIPITOR) 20 MG tablet  TAKE 1 TABLET BY MOUTH DAILY AT 6PM ?Patient taking differently: Take 20 mg by mouth daily. 02/19/21  Yes Nahser, Wonda Cheng, MD  ?calcium-vitamin D (OSCAL WITH D) 500-200 MG-UNIT tablet Take 1 tablet by mouth. 1023mg   Yes [provider]  ?dofetilide (TIKOSYN) 125 MCG capsule Take 3 capsules (375 mcg total) by mouth 2 (two) times daily. 04/05/21  Yes Nahser, PWonda Cheng MD  ?ezetimibe (ZETIA) 10 MG tablet TAKE 1 TABLET EVERY DAY ?Patient taking differently: Take 10 mg by mouth daily. 04/26/20  Yes Nahser, PWonda Cheng MD  ?furosemide (LASIX) 40 MG tablet Take 1 tablet (40 mg total) by mouth daily. 01/08/21  Yes Nahser, PWonda Cheng MD  ?gabapentin (NEURONTIN) 600 MG tablet Take 900 mg by mouth at bedtime.   Yes [provider]  ?metoCLOPramide (REGLAN) 10 MG tablet Take 10 mg by mouth 4 (four) times daily as needed for nausea.   Yes [provider]  ?Multiple Vitamins-Minerals (PRESERVISION AREDS 2 PO) Take 1 capsule by mouth 2 (two) times daily.   Yes [provider]  ?Polyethyl Glycol-Propyl Glycol (SYSTANE OP) Apply 1 drop to eye daily as needed (dry eyes).   Yes [provider]  ?potassium chloride SA (KLOR-CON) 20 MEQ tablet Take 1 tablet (20 mEq total) by mouth daily. 10/10/20  Yes  LImogene Burn PA-C  ?propranolol (INDERAL) 10 MG tablet Take 1 tablet (10 mg total) by mouth 4 (four) times daily as needed (for a-fib/palpitations). ?Patient taking differently: Take 10 mg by mouth See ad

## 2021-04-16 NOTE — H&P (Signed)
? ? ?Reason for Consult/Chief Complaint:fall on blood thinners, hypotension ?Consultant: Alvino Chapel, MD ? ?[name] is an 71 y.o. female.  ? ?HPI: 33F with a history of pAF with physical symptoms of chest tightness and SOB with onset, had an episode 3/27 AM. She has a prn propranolol in addition to scheduled dose that she took two prn doses of in response to her symptoms. She remembers feeling like she was going to fall and yelled for her husband. Hit her head at the time of the fall and had LOC unresponsive to sternal rub for ~25m per husband. Endorses HA. Still endorsing CP and dyspnea. No new neck, back, abdominal, extremity pain. No blurry vision/diplopia. Baseline vertigo, stable. Takes Xarelto, last dose 3/27 AM. ? ?Past Medical History:  ?Diagnosis Date  ? Atrial fibrillation ([place])   ? Back pain   ? nerve problems-had series of three injections  ? Bone spur   ? Spine  ? Chronic anticoagulation   ? Diastolic congestive heart failure ([place])   ? Hearing loss   ? History of shingles   ? Hyperlipemia   ? Migraine headache   ? Obstructive sleep apnea   ? uses CPAP  ? OSA on CPAP   ? Paroxysmal atrial fibrillation ([facility])   ? Restrictive cardiomyopathy ([place])   ? Tremor   ? Tubal pregnancy   ? ? ?Past Surgical History:  ?Procedure Laterality Date  ? BREAST EXCISIONAL BIOPSY Left   ? benign  ? CARDIAC CATHETERIZATION    ? CARDIOVERSION    ? CHOLECYSTECTOMY    ? [place]  ? EYE SURGERY    ? left eye injections x2-was having "stroke like" flashes of light  ? heart biopsy  2008  ? [facility]  ? HYSTEROSCOPY  6/05  ? and D&C-simple hyperplasia secondary PMP bleeding  ? ROTATOR CUFF REPAIR    ? bilateral  ? ? ?Family History  ?Problem Relation Age of Onset  ? Hypertension Father   ? Dementia Father   ? Seizures Father   ? Atrial fibrillation Mother   ? Cancer Maternal Grandmother   ?     mouth  ? Cancer Maternal Uncle   ?     unknown type  ? Thyroid cancer Sister   ? Breast cancer Sister   ?      breast cancer  ? Heart disease Sister   ? Atrial fibrillation Sister   ? Dementia Sister   ? ? ?Social History:  reports that she has never smoked. She has never used smokeless tobacco. She reports that she does not currently use alcohol. She reports that she does not use drugs. ? ?Allergies:  ?Allergies  ?Allergen Reactions  ? Oxycodone Hives  ?  Hives   ? Topamax [Topiramate] Other (See Comments)  ?  hallucinations   ? Codeine Nausea And Vomiting  ?  VERTIGO  ? Penicillins Rash  ? Tramadol   ?  dizziness  ? ? ?Medications: I have reviewed the patient's current medications. ? ?Results for orders placed or performed during the hospital encounter of 04/16/21 (from the past 48 hour(s))  ?CBC with Differential     Status: Abnormal  ? Collection Time: 04/16/21 11:43 AM  ?Result Value Ref Range  ? WBC 11.2 (H) 4.0 - 10.5 K/uL  ? RBC 4.58 3.87 - 5.11 MIL/uL  ? Hemoglobin 13.3 12.0 - 15.0 g/dL  ? HCT 40.8 36.0 - 46.0 %  ? MCV 89.1 80.0 - 100.0 fL  ?  MCH 29.0 26.0 - 34.0 pg  ? MCHC 32.6 30.0 - 36.0 g/dL  ? RDW 14.3 11.5 - 15.5 %  ? Platelets 243 150 - 400 K/uL  ? nRBC 0.0 0.0 - 0.2 %  ? Neutrophils Relative % 80 %  ? Neutro Abs 8.8 (H) 1.7 - 7.7 K/uL  ? Lymphocytes Relative 14 %  ? Lymphs Abs 1.6 0.7 - 4.0 K/uL  ? Monocytes Relative 5 %  ? Monocytes Absolute 0.6 0.1 - 1.0 K/uL  ? Eosinophils Relative 1 %  ? Eosinophils Absolute 0.1 0.0 - 0.5 K/uL  ? Basophils Relative 0 %  ? Basophils Absolute 0.0 0.0 - 0.1 K/uL  ? Immature Granulocytes 0 %  ? Abs Immature Granulocytes 0.04 0.00 - 0.07 K/uL  ?  Comment: Performed at [facility], [place] [address] [place], [place] [address]  ?Comprehensive metabolic panel     Status: Abnormal  ? Collection Time: 04/16/21 11:43 AM  ?Result Value Ref Range  ? Sodium 140 135 - 145 mmol/L  ? Potassium 4.1 3.5 - 5.1 mmol/L  ? Chloride 108 98 - 111 mmol/L  ? CO2 25 22 - 32 mmol/L  ? Glucose, Bld 107 (H) 70 - 99 mg/dL  ?  Comment: Glucose reference range applies only to samples taken after  fasting for at least 8 hours.  ? BUN 15 8 - 23 mg/dL  ? Creatinine, Ser 0.79 0.44 - 1.00 mg/dL  ? Calcium 9.0 8.9 - 10.3 mg/dL  ? Total Protein 5.9 (L) 6.5 - 8.1 g/dL  ? Albumin 3.2 (L) 3.5 - 5.0 g/dL  ? AST 21 15 - 41 U/L  ? ALT 11 0 - 44 U/L  ? Alkaline Phosphatase 91 38 - 126 U/L  ? Total Bilirubin 0.5 0.3 - 1.2 mg/dL  ? GFR, Estimated >60 >60 mL/min  ?  Comment: (NOTE) ?Calculated using the CKD-EPI Creatinine Equation (2021) ?  ? Anion gap 7 5 - 15  ?  Comment: Performed at [facility], [place] [address] [place], [place] [address]  ?Troponin I (High Sensitivity)     Status: None  ? Collection Time: 04/16/21 11:43 AM  ?Result Value Ref Range  ? Troponin I (High Sensitivity) 9 <18 ng/L  ?  Comment: (NOTE) ?Elevated high sensitivity troponin I (hsTnI) values and significant  ?changes across serial measurements may suggest ACS but many other  ?chronic and acute conditions are known to elevate hsTnI results.  ?Refer to the "Links" section for chest pain algorithms and additional  ?guidance. ?Performed at [facility], [place] [address] [place], [place] ?[address] ?  ? ? ?CT HEAD WO CONTRAST (5MM) ? ?Result Date: 04/16/2021 ?CLINICAL DATA:  Trauma, fall EXAM: CT HEAD WITHOUT CONTRAST TECHNIQUE: Contiguous axial images were obtained from the base of the skull through the vertex without intravenous contrast. RADIATION DOSE REDUCTION: This exam was performed according to the departmental dose-optimization program which includes automated exposure control, adjustment of the mA and/or kV according to patient size and/or use of iterative reconstruction technique. COMPARISON:  None. FINDINGS: Brain: No acute intracranial findings are seen in noncontrast CT brain. Cortical sulci are prominent. Ventricles are not dilated. There is no shift of midline structures. There are no epidural or subdural fluid collections. Vascular: Unremarkable. Skull: No fracture is seen in the calvarium. Hyperostosis frontalis interna is  seen. Sinuses/Orbits: There is calcific density in the anterior aspect of right maxillary antrum, possibly osteoma. Other: None. IMPRESSION: No acute intracranial  findings are seen in noncontrast CT brain. Atrophy. Electronically Signed   By: Elmer Picker M.D.   On: 04/16/2021 12:07  ? ?DG Chest Portable 1 View ? ?Result Date: 04/16/2021 ?CLINICAL DATA:  Syncope in a 71 year old female. EXAM: PORTABLE CHEST 1 VIEW COMPARISON:  April 03, 2020. FINDINGS: EKG leads project over the patient's chest. Image rotated slightly to the RIGHT. Cardiomediastinal contours and hilar structures are stable. Lungs are clear.  No sign of effusion on frontal radiograph. On limited assessment there is no acute skeletal process. IMPRESSION: No acute cardiopulmonary disease. Electronically Signed   By: Zetta Bills M.D.   On: 04/16/2021 11:53   ? ?ROS ?10 point review of systems is negative except as listed above in HPI.  ? ?Physical Exam ?Blood pressure (!) 91/54, pulse (!) 53, temperature (!) 97.4 ?F (36.3 ?C), temperature source Oral, resp. rate 16, height 5' 9.5" (1.765 m), weight 90.5 kg, last menstrual period 01/21/1990, SpO2 97 %. ?Constitutional: well-developed, well-nourished ?HEENT: pupils equal, round, reactive to light, 50mm b/l, moist conjunctiva, external inspection of ears and nose normal, hearing intact, 2cm laceration to L lateral eye ?Oropharynx: normal oropharyngeal mucosa, poor dentition ?Neck: no thyromegaly, trachea midline, no midline cervical tenderness to palpation ?Chest: breath sounds equal bilaterally, normal respiratory effort, + midline upper chest wall tenderness to palpation without deformity, reports this is 2/2 prior external heart monitor use and/or sternal rub by husband ?Abdomen: soft, NT, UH, no bruising, no hepatosplenomegaly ?GU: normal female genitalia  ?Back: no wounds, no thoracic/lumbar spine tenderness to palpation, no thoracic/lumbar spine stepoffs ?Rectal: deferred ?Extremities: 2+  radial and pedal pulses bilaterally, intact motor and sensation bilateral UE and LE, no peripheral edema ?MSK: unable to assess gait/station, no clubbing/cyanosis of fingers/toes, normal ROM of all four extremit

## 2021-04-16 NOTE — ED Notes (Signed)
Patient transported to CT 

## 2021-04-16 NOTE — Progress Notes (Signed)
?  04/16/21 1105  ?Clinical Encounter Type  ?Visited With Patient and family together;Health care provider  ?Visit Type ED;Trauma;Initial  ?Referral From Nurse  ?Consult/Referral To Chaplain  ? ?Responded to page in E.D. Room 30 for Level 2 Trauma. Met [name] and her husband, [name] at patient's bedside. Chaplain provided meaningful presence; offering hospitality and support to [name] and her husband. Husband spoke of their [employer] in [place], [place]. Nurse will page Chaplain if further care is requestd of patient or family. Chaplain Dyanara Cozza, M.Min., [phone].    ?

## 2021-04-16 NOTE — H&P (Signed)
?History and Physical  ? ? ?[name] QQI:[id number] DOB: Jun 08, 1950 DOA: 04/16/2021 ? ?PCP: [name] Corn, MD (Confirm with patient/family/NH records and if not entered, this has to be entered at [facility] point of entry) ?Patient coming from: Home ? ?I have personally briefly reviewed patient's old medical records in [facility] ? ?Chief Complaint: I passed out ? ?HPI: [name] is a 71 y.o. female with medical history significant of PAF on Tikosyn, chronic diastolic CHF, essential tremors, OSA on CPAP, HLD, presented with syncope. ? ?Patient has a history of PAF status post cardioversion, she has been on Tikosyn and propanolol twice daily for rate control.  She has remained in sinus rhythm until Friday.  Then patient started to feel episode of palpitations, she uses occasional propanolol over the weekend with some success.  This morning, patient woke up with feeling of nausea, again she started to feel palpitations and she took 2 propranolol at once.  Then the next thing patient remembers was she was on the floor.  Denies any prodromes of lightheadedness or blurry vision.  She did hit her left for side and eye lid lacerated.  Husband outside the room heard the patient fall and came in and saw the patient unconscious with agonal breathing but pulses could be felt easily.  And patient did look pale and clammy. Husband performed sternal rub x2, patient slowly recovered consciousness.  And patient claims feeling nauseous.  Her apple watch did not record A-fib and bradycardia of 39 bpm.  She denies any chest pain, no shortness of breath, no cough no urinary symptoms. ? ?ED Course: Patient was found to be in rapid A-fib, blood pressure on the lower side.  Mentating well. ? ?Left eyelid laceration was sutured. ? ?Review of Systems: As per HPI otherwise 14 point review of systems negative.  ? ? ?Past Medical History:  ?Diagnosis Date  ? Atrial fibrillation ([facility])   ? Back pain   ? nerve problems-had series  of three injections  ? Bone spur   ? Spine  ? Chronic anticoagulation   ? Diastolic congestive heart failure ([facility])   ? Hearing loss   ? History of shingles   ? Hyperlipemia   ? Migraine headache   ? Obstructive sleep apnea   ? uses CPAP  ? OSA on CPAP   ? Paroxysmal atrial fibrillation ([facility])   ? Restrictive cardiomyopathy ([facility])   ? Tremor   ? Tubal pregnancy   ? ? ?Past Surgical History:  ?Procedure Laterality Date  ? BREAST EXCISIONAL BIOPSY Left   ? benign  ? CARDIAC CATHETERIZATION    ? CARDIOVERSION    ? CHOLECYSTECTOMY    ? ECTOPIC PREGNANCY SURGERY  1980  ? EYE SURGERY    ? left eye injections x2-was having "stroke like" flashes of light  ? heart biopsy  2008  ? [facility]  ? HYSTEROSCOPY  6/05  ? and D&C-simple hyperplasia secondary PMP bleeding  ? ROTATOR CUFF REPAIR    ? bilateral  ? ? ? reports that she has never smoked. She has never used smokeless tobacco. She reports that she does not currently use alcohol. She reports that she does not use drugs. ? ?Allergies  ?Allergen Reactions  ? Oxycodone Hives  ?  Hives   ? Topamax [Topiramate] Other (See Comments)  ?  hallucinations   ? Codeine Nausea And Vomiting  ?  VERTIGO  ? Penicillins Rash  ? Tramadol   ?  dizziness  ? ? ?  Family History  ?Problem Relation Age of Onset  ? Hypertension Father   ? Dementia Father   ? Seizures Father   ? Atrial fibrillation Mother   ? Cancer Maternal Grandmother   ?     mouth  ? Cancer Maternal Uncle   ?     unknown type  ? Thyroid cancer Sister   ? Breast cancer Sister   ?     breast cancer  ? Heart disease Sister   ? Atrial fibrillation Sister   ? Dementia Sister   ? ? ?Prior to Admission medications   ?Medication Sig Start Date End Date Taking? Authorizing Provider  ?albuterol (VENTOLIN HFA) 108 (90 Base) MCG/ACT inhaler Inhale 2 puffs into the lungs every 6 (six) hours as needed for wheezing or shortness of breath. 08/11/19  Yes [name] Milch, MD  ?atorvastatin (LIPITOR) 20 MG tablet TAKE 1 TABLET BY MOUTH DAILY AT 6PM ?Patient  taking differently: Take 20 mg by mouth daily. 02/19/21  Yes [name], Deloris Kemya Shed, MD  ?calcium-vitamin D (OSCAL WITH D) 500-200 MG-UNIT tablet Take 1 tablet by mouth.   Yes [provider]  ?dofetilide (TIKOSYN) 125 MCG capsule Take 3 capsules (375 mcg total) by mouth 2 (two) times daily. 04/05/21  Yes [name], Deloris Fynlee Rowlands, MD  ?ezetimibe (ZETIA) 10 MG tablet TAKE 1 TABLET EVERY DAY ?Patient taking differently: Take 10 mg by mouth daily. 04/26/20  Yes [name], Deloris Brayla Pat, MD  ?furosemide (LASIX) 40 MG tablet Take 1 tablet (40 mg total) by mouth daily. 01/08/21  Yes [name], Deloris Lakyn Alsteen, MD  ?gabapentin (NEURONTIN) 600 MG tablet Take 900 mg by mouth at bedtime.   Yes [provider]  ?metoCLOPramide (REGLAN) 10 MG tablet Take 10 mg by mouth 4 (four) times daily as needed for nausea.   Yes [provider]  ?Multiple Vitamins-Minerals (PRESERVISION AREDS 2 PO) Take 1 capsule by mouth 2 (two) times daily.   Yes [provider]  ?Polyethyl Glycol-Propyl Glycol (SYSTANE OP) Apply 1 drop to eye daily as needed (dry eyes).   Yes [provider]  ?potassium chloride SA (KLOR-CON) 20 MEQ tablet Take 1 tablet (20 mEq total) by mouth daily. 10/10/20  Yes [name] Kief, PA-C  ?propranolol (INDERAL) 10 MG tablet Take 1 tablet (10 mg total) by mouth 4 (four) times daily as needed (for a-fib/palpitations). ?Patient taking differently: Take 10 mg by mouth See admin instructions. Take 10mg  twice daily, and an additional 10mg  as needed for a-fib/palpitations 04/02/21  Yes [name], , MD  ?spironolactone (ALDACTONE) 25 MG tablet Take 1 tablet (25 mg total) by mouth daily. 02/01/20  Yes [name], Deloris Tylisha Danis, MD  ?03/31/20 20 MG TABS tablet TAKE 1 TABLET BY MOUTH DAILY WITH SUPPER ?Patient taking differently: Take 20 mg by mouth daily. 02/19/21  Yes [name], Carlena Hurl, MD  ? ? ?Physical Exam: ?Vitals:  ? 04/16/21 1315 04/16/21 1415 04/16/21 1515 04/16/21 1545  ?BP: 107/70 103/61 114/85 (!) 86/57  ?Pulse:  73 78 (!) 113 85  ?Resp: (!) 21 14 13 14   ?Temp:      ?TempSrc:      ?SpO2: 98% 100% 98% 97%  ?Weight:      ?Height:      ? ? ?Constitutional: NAD, calm, comfortable ?Vitals:  ? 04/16/21 1315 04/16/21 1415 04/16/21 1515 04/16/21 1545  ?BP: 107/70 103/61 114/85 (!) 86/57  ?Pulse: 73 78 (!) 113 85  ?Resp: (!) 21 14 13 14   ?Temp:      ?  TempSrc:      ?SpO2: 98% 100% 98% 97%  ?Weight:      ?Height:      ? ?Eyes: PERRL, lids and conjunctivae normal ?ENMT: Mucous membranes are dry. Posterior pharynx clear of any exudate or lesions.Normal dentition.  ?Neck: normal, supple, no masses, no thyromegaly ?Respiratory: clear to auscultation bilaterally, no wheezing, no crackles. Normal respiratory effort. No accessory muscle use.  ?Cardiovascular: Irregular heart rate, no murmurs / rubs / gallops. No extremity edema. 2+ pedal pulses. No carotid bruits.  ?Abdomen: no tenderness, no masses palpated. No hepatosplenomegaly. Bowel sounds positive.  ?Musculoskeletal: no clubbing / cyanosis. No joint deformity upper and lower extremities. Good ROM, no contractures. Normal muscle tone.  ?Skin: no rashes, lesions, ulcers. No induration ?Neurologic: CN 2-12 grossly intact. Sensation intact, DTR normal. Strength 5/5 in all 4.  ?Psychiatric: Normal judgment and insight. Alert and oriented x 3. Normal mood.  ? ? ? ?Labs on Admission: I have personally reviewed following labs and imaging studies ? ?CBC: ?Recent Labs  ?Lab 04/16/21 ?1143  ?WBC 11.2*  ?NEUTROABS 8.8*  ?HGB 13.3  ?HCT 40.8  ?MCV 89.1  ?PLT 243  ? ?Basic Metabolic Panel: ?Recent Labs  ?Lab 04/16/21 ?1143  ?NA 140  ?K 4.1  ?CL 108  ?CO2 25  ?GLUCOSE 107*  ?BUN 15  ?CREATININE 0.79  ?CALCIUM 9.0  ? ?GFR: ?Estimated Creatinine Clearance: 79.1 mL/min (by C-G formula based on SCr of 0.79 mg/dL). ?Liver Function Tests: ?Recent Labs  ?Lab 04/16/21 ?1143  ?AST 21  ?ALT 11  ?ALKPHOS 91  ?BILITOT 0.5  ?PROT 5.9*  ?ALBUMIN 3.2*  ? ?No results for input(s): LIPASE, AMYLASE in the last 168  hours. ?No results for input(s): AMMONIA in the last 168 hours. ?Coagulation Profile: ?No results for input(s): INR, PROTIME in the last 168 hours. ?Cardiac Enzymes: ?No results for input(s): CKTOTAL, CKM

## 2021-04-16 NOTE — Progress Notes (Signed)
Patient declined CPAP for the night  

## 2021-04-16 NOTE — ED Notes (Signed)
[facility] at bedside

## 2021-04-16 NOTE — Progress Notes (Signed)
Orthopedic Tech Progress Note ?Patient Details:  ?[name] ?[birth year] ?[id number] ? ?Level 2 trauma  ? ?Patient ID: [name], female   DOB: [birth year], 71 y.o.   MRN: [id number] ? ?[name] ?04/16/2021, 11:31 AM ? ?

## 2021-04-17 ENCOUNTER — Encounter (HOSPITAL_COMMUNITY)
Admission: EM | Disposition: A | Discharge status: Home / Self Care | Payer: Self-pay | Source: Home / Self Care | Attending: Emergency Medicine

## 2021-04-17 ENCOUNTER — Encounter (HOSPITAL_COMMUNITY): Payer: Self-pay | Admitting: Certified Registered"

## 2021-04-17 ENCOUNTER — Observation Stay (HOSPITAL_BASED_OUTPATIENT_CLINIC_OR_DEPARTMENT_OTHER): Payer: Medicare PPO

## 2021-04-17 DIAGNOSIS — R55 Syncope and collapse: Secondary | ICD-10-CM

## 2021-04-17 DIAGNOSIS — S01112A Laceration without foreign body of left eyelid and periocular area, initial encounter: Secondary | ICD-10-CM | POA: Diagnosis not present

## 2021-04-17 DIAGNOSIS — Z7901 Long term (current) use of anticoagulants: Secondary | ICD-10-CM | POA: Diagnosis not present

## 2021-04-17 DIAGNOSIS — I5030 Unspecified diastolic (congestive) heart failure: Secondary | ICD-10-CM | POA: Diagnosis not present

## 2021-04-17 DIAGNOSIS — Z79899 Other long term (current) drug therapy: Secondary | ICD-10-CM | POA: Diagnosis not present

## 2021-04-17 DIAGNOSIS — N1831 Chronic kidney disease, stage 3a: Secondary | ICD-10-CM

## 2021-04-17 DIAGNOSIS — I429 Cardiomyopathy, unspecified: Secondary | ICD-10-CM | POA: Diagnosis not present

## 2021-04-17 DIAGNOSIS — I480 Paroxysmal atrial fibrillation: Secondary | ICD-10-CM | POA: Diagnosis not present

## 2021-04-17 LAB — CBC
HCT: 40 % (ref 36.0–46.0)
Hemoglobin: 12.6 g/dL (ref 12.0–15.0)
MCH: 28.6 pg (ref 26.0–34.0)
MCHC: 31.5 g/dL (ref 30.0–36.0)
MCV: 90.9 fL (ref 80.0–100.0)
Platelets: 209 10*3/uL (ref 150–400)
RBC: 4.4 MIL/uL (ref 3.87–5.11)
RDW: 14.4 % (ref 11.5–15.5)
WBC: 7.2 10*3/uL (ref 4.0–10.5)
nRBC: 0 % (ref 0.0–0.2)

## 2021-04-17 LAB — ECHOCARDIOGRAM COMPLETE
AR max vel: 1.51 cm2
AV Area VTI: 1.51 cm2
AV Area mean vel: 1.43 cm2
AV Mean grad: 3 mmHg
AV Peak grad: 6.3 mmHg
Ao pk vel: 1.25 m/s
Calc EF: 36.6 %
Height: 69.5 in
S' Lateral: 1.8 cm
Single Plane A2C EF: 35 %
Single Plane A4C EF: 38.8 %
Weight: 3192 oz

## 2021-04-17 LAB — PROTIME-INR
INR: 1.8 — ABNORMAL HIGH (ref 0.8–1.2)
Prothrombin Time: 21.2 seconds — ABNORMAL HIGH (ref 11.4–15.2)

## 2021-04-17 LAB — CBG MONITORING, ED
Glucose-Capillary: 90 mg/dL (ref 70–99)
Glucose-Capillary: 126 mg/dL — ABNORMAL HIGH (ref 70–99)

## 2021-04-17 LAB — BASIC METABOLIC PANEL
Anion gap: 5 (ref 5–15)
BUN: 12 mg/dL (ref 8–23)
CO2: 26 mmol/L (ref 22–32)
Calcium: 9 mg/dL (ref 8.9–10.3)
Chloride: 110 mmol/L (ref 98–111)
Creatinine, Ser: 0.77 mg/dL (ref 0.44–1.00)
GFR, Estimated: >60 mL/min (ref >60)
Glucose, Bld: 98 mg/dL (ref 70–99)
Potassium: 4 mmol/L (ref 3.5–5.1)
Sodium: 141 mmol/L (ref 135–145)

## 2021-04-17 LAB — MAGNESIUM: Magnesium: 2.1 mg/dL (ref 1.7–2.4)

## 2021-04-17 SURGERY — CANCELLED PROCEDURE

## 2021-04-17 MED ORDER — SODIUM CHLORIDE 0.9 % IV SOLN
INTRAVENOUS | Status: DC
Start: 2021-04-17 — End: 2021-04-18

## 2021-04-17 MED ORDER — RIVAROXABAN 20 MG PO TABS
20.0000 mg | ORAL_TABLET | Freq: Every day | ORAL | Status: DC
  Administered 2021-04-17: 20 mg via ORAL
  Filled 2021-04-17: qty 2

## 2021-04-17 NOTE — TOC CAGE-AID Note (Signed)
Transition of Care (TOC) - CAGE-AID Screening ? ? ?Patient Details  ?Name: [name] ?MRN: [id number] ?Date of Birth: 1950-10-03 ? ?Transition of Care (TOC) CM/SW Contact:    ?Tashawna Thom C Tarpley-Carter, LCSWA ?Phone Number: ?04/17/2021, 11:09 AM ? ? ?Clinical Narrative: ?Pt participated in Cage-Aid.  Pt stated she does not use substance or ETOH.  Pt was not offered resources, due to no usage of substance or ETOH.    ? ?Insurance underwriter, MSW, LCSW-A ?Pronouns:  She/Her/Hers ?[facility] HealthTransitions of Care ?Clinical Social Worker ?Direct Number:  [phone] ?[email or web address] ? ?CAGE-AID Screening: ?  ? ?Have You Ever Felt You Ought to Cut Down on Your Drinking or Drug Use?: No ?Have People Annoyed You By Critizing Your Drinking Or Drug Use?: No ?Have You Felt Bad Or Guilty About Your Drinking Or Drug Use?: No ?Have You Ever Had a Drink or Used Drugs First Thing In The Morning to Steady Your Nerves or to Get Rid of a Hangover?: No ?CAGE-AID Score: 0 ? ?Substance Abuse Education Offered: No ? ?  ? ? ? ? ? ? ?

## 2021-04-17 NOTE — Discharge Summary (Signed)
?Physician Discharge Summary ?  ?Patient: [name] MRN: [id number] DOB: [birth year]  ?Admit date:     04/16/2021  ?Discharge date: 04/17/21  ?Discharge Physician: Edwin Dada  ? ?PCP: Shon Baton, MD  ? ?Recommendations at discharge:  ?Follow up with PCP Dr. Virgina Jock in 1 week for removal of 3 sutures over left eye on Apr 1-3 ?Follow up with Cardiology Dr. Acie Fredrickson as directed ? ? ? ? ?Discharge Diagnoses: ?Principal Problem: ?  Syncope ?Active Problems: ?  ATRIAL FIBRILLATION paroxysmal ?  Chronic kidney disease, stage 3a ([place]) ?  ? ?Hospital Course: ?[name] is a 71 y.o. F with pAF on Tikosyn, OSA on CPAP who presented with syncope. ? ?Since her syncope at [facility] last Sep, she has been experiencing an increased symptom burden that limits her functional status.  Some of these symptoms include palpitations and exercise intolerance, which could potentially be related to her paroxysmal atrial fibrillation.  Some of the symptoms of loss of voice or vertigo likely are not related. ? ?She, Dr. Virgina Jock and Dr. Acie Fredrickson appear to be working diligently to dig to the bottom of these symptoms. ? ?In this context, the patient had a syncopal episode after taking an extra dose of her propranolol, then getting up from the toilet.  Felt lightheaded, short of breath, then passed out, striking her eye. ? ?Here, she was found to be in Afib, rates around 100bpm.  Troponin normal and ECG without ischemic change.  CT head unremarkable, electrolytes and renal function and hemogram normal.  ? ?Observed ovenright.  Echocardiogram with normal EF, no valvular disease or RWMA.  Stable rate controlled Afib overnight on tele.  In the morning, she was asymptomatic.  Cardiology planned cardioversion but she converted spontaneously to sinus rhythm. ? ?An appointment with Electrophysiology Dr. Rayann Heman was made prior to her discharge, and Dr. Tamala Julian of Cardiology and I advised her to follow up with Dr. Acie Fredrickson for consideration of ILR  given the recurrence of this episode, her substantial fear of these episodes and the desire for more clarity about their origin.  No changes to medications at this time. ? ? ? ? ? ? ? ?  ? ?Pain control - [employer] Controlled Substance Reporting System database was reviewed.  ? ? ?  ?Consultants: Cardiology ?Procedures performed: Echocardiogram  ?Disposition: Home ?Diet recommendation:  ?Discharge Diet Orders (From admission, onward)  ? ?  Start     Ordered  ? 04/17/21 0000  Diet - low sodium heart healthy       ? 04/17/21 1527  ? ?  ?  ? ?  ? ? ?DISCHARGE MEDICATION: ?Allergies as of 04/17/2021   ? ?   Reactions  ? Oxycodone Hives  ? Hives   ? Topamax [topiramate] Other (See Comments)  ? hallucinations   ? Codeine Nausea And Vomiting  ? VERTIGO  ? Penicillins Rash  ? Tramadol   ? dizziness  ? ?  ? ?  ?Medication List  ?  ? ?TAKE these medications   ? ?albuterol 108 (90 Base) MCG/ACT inhaler ?Commonly known as: VENTOLIN HFA ?Inhale 2 puffs into the lungs every 6 (six) hours as needed for wheezing or shortness of breath. ?  ?atorvastatin 20 MG tablet ?Commonly known as: LIPITOR ?TAKE 1 TABLET BY MOUTH DAILY AT 6PM ?What changed: See the new instructions. ?  ?calcium-vitamin D 500-200 MG-UNIT tablet ?Commonly known as: OSCAL WITH D ?Take 1 tablet by mouth. 1040mcg ?  ?dofetilide 125 MCG capsule ?Commonly  known as: TIKOSYN ?Take 3 capsules (375 mcg total) by mouth 2 (two) times daily. ?  ?ezetimibe 10 MG tablet ?Commonly known as: ZETIA ?TAKE 1 TABLET EVERY DAY ?  ?furosemide 40 MG tablet ?Commonly known as: LASIX ?Take 1 tablet (40 mg total) by mouth daily. ?  ?gabapentin 600 MG tablet ?Commonly known as: NEURONTIN ?Take 900 mg by mouth at bedtime. ?  ?metoCLOPramide 10 MG tablet ?Commonly known as: REGLAN ?Take 10 mg by mouth 4 (four) times daily as needed for nausea. ?  ?potassium chloride SA 20 MEQ tablet ?Commonly known as: KLOR-CON M ?Take 1 tablet (20 mEq total) by mouth daily. ?  ?PRESERVISION AREDS 2  PO ?Take 1 capsule by mouth 2 (two) times daily. ?  ?propranolol 10 MG tablet ?Commonly known as: INDERAL ?Take 1 tablet (10 mg total) by mouth 4 (four) times daily as needed (for a-fib/palpitations). ?What changed:  ?when to take this ?additional instructions ?  ?spironolactone 25 MG tablet ?Commonly known as: ALDACTONE ?Take 1 tablet (25 mg total) by mouth daily. ?  ?SYSTANE OP ?Apply 1 drop to eye daily as needed (dry eyes). ?  ?Xarelto 20 MG Tabs tablet ?Generic drug: rivaroxaban ?TAKE 1 TABLET BY MOUTH DAILY WITH SUPPER ?What changed:  ?how much to take ?when to take this ?  ? ?  ? ? Follow-up Information   ? ? Thompson Grayer, MD Follow up on 05/04/2021.   ?Specialty: Cardiology ?Why: at 11:15am for your follow up appt ?Contact information: ?[id number] ?[place] [address] ?[phone] ? ? ?  ?  ? ? Nahser, Wonda Cheng, MD. Schedule an appointment as soon as possible for a visit.   ?Specialty: Cardiology ?Contact information: ?[address] ?[address] ?[place] [address] ?[phone] ? ? ?  ?  ? ?  ?  ? ?  ? ?Discharge Instructions   ? ? Diet - low sodium heart healthy   Complete by: As directed ?  ? Discharge instructions   Complete by: As directed ?  ? From Dr. Loleta Books: ?You were admitted overnight for a passing out episode. ?This type of episode is called "syncope" and it refers to a complete loss of strength (like slumping over) and often losing consciousness.  ? ?It is caused by a wide range of things, including "vasovagal syncope", extremely slow heart rates, extremely fast heart rates, dehydration or orthostatic hypotension. ? ?In your case, you were observed overnight, and had atrial fibrillation with controlled rate.  You had an ultrasound of your heart with was again normal.  Your electrolytes, kidney function, and blood counts were all reassuring that nothing else serious was happening. ? ?We planned for a procedure to "cardiovert" your heart from Afib back to normal rhythm, but it did  this spontaneously for [place]! ? ?Follow up as directed by Dr. Loleta Books. ? ?Resume your normal home medicines. ? ? ?For your eye:  ?Ask Dr. Virgina Jock or his office to take your 3 sutures out in 5-7 days (so Saturday or Monday) ?And if you start to have issues with watery eye and tearing in that eye, call your Ophthalmologist Dr. Silvestre Gunner at the [facility] for an appointment (or ask Dr. Virgina Jock)  ? Increase activity slowly   Complete by: As directed ?  ? ?  ? ? ?Discharge Exam: ?Filed Weights  ? 04/16/21 1127 04/17/21 1242  ?Weight: 90.5 kg 92.7 kg  ? ?General: Pt is alert, awake, not in acute distress, laceration over left eye, hemostatic ?Cardiovascular:  RRR, nl S1-S2, no murmurs appreciated.   No LE edema.   ?Respiratory: Normal respiratory rate and rhythm.  CTAB without rales or wheezes. ?Abdominal: Abdomen soft and non-tender.  No distension or HSM.   ?Neuro/Psych: Strength symmetric in upper and lower extremities.  Judgment and insight appear normal. ? ? ?Condition at discharge: good ? ?The results of significant diagnostics from this hospitalization (including imaging, microbiology, ancillary and laboratory) are listed below for reference.  ? ?Imaging Studies: ?CT HEAD WO CONTRAST (5MM) ? ?Result Date: 04/16/2021 ?CLINICAL DATA:  Trauma, fall EXAM: CT HEAD WITHOUT CONTRAST TECHNIQUE: Contiguous axial images were obtained from the base of the skull through the vertex without intravenous contrast. RADIATION DOSE REDUCTION: This exam was performed according to the departmental dose-optimization program which includes automated exposure control, adjustment of the mA and/or kV according to patient size and/or use of iterative reconstruction technique. COMPARISON:  None. FINDINGS: Brain: No acute intracranial findings are seen in noncontrast CT brain. Cortical sulci are prominent. Ventricles are not dilated. There is no shift of midline structures. There are no epidural or subdural fluid collections. Vascular: Unremarkable. Skull:  No fracture is seen in the calvarium. Hyperostosis frontalis interna is seen. Sinuses/Orbits: There is calcific density in the anterior aspect of right maxillary antrum, possibly osteoma. Other: None. IMPRESSI

## 2021-04-17 NOTE — ED Notes (Signed)
Consent form signed.

## 2021-04-17 NOTE — ED Notes (Signed)
ED TO INPATIENT HANDOFF REPORT ? ?ED Nurse Name and Phone #: Baxter Flattery, RN ? ?S ?Name/Age/Gender ?[name] ?71 y.o. ?female ?Room/Bed: 045C/045C ? ?Code Status ?  Code Status: Full Code ? ?Home/SNF/Other ?Home ?Patient oriented to: self, place, time, and situation ?Is this baseline? Yes  ? ?Triage Complete: Triage complete  ?Chief Complaint ?Syncope [R55] ? ?Triage Note ?Pt BIB [facility] from home after fall in bathroom on thinners. Pt on thinners for afib A&Ox4 at this time  ? ?Allergies ?Allergies  ?Allergen Reactions  ? Oxycodone Hives  ?  Hives   ? Topamax [Topiramate] Other (See Comments)  ?  hallucinations   ? Codeine Nausea And Vomiting  ?  VERTIGO  ? Penicillins Rash  ? Tramadol   ?  dizziness  ? ? ?Level of Care/Admitting Diagnosis ?ED Disposition   ? ? ED Disposition  ?Admit  ? Condition  ?--  ? Comment  ?Hospital Area: [facility] [id number] ? Level of Care: Telemetry Medical [104] ? May place patient in observation at [facility] or [place] if equivalent level of care is available:: No ? Covid Evaluation: Asymptomatic - no recent exposure (last 10 days) testing not required ? Diagnosis: Syncope [206001] ? Admitting Physician: Lequita Halt [id number] ? Attending Physician: Lequita Halt [id number] ?  ?  ? ?  ? ? ?B ?Medical/Surgery History ?Past Medical History:  ?Diagnosis Date  ? Atrial fibrillation ([place])   ? Back pain   ? nerve problems-had series of three injections  ? Bone spur   ? Spine  ? Chronic anticoagulation   ? Diastolic congestive heart failure ([place])   ? Hearing loss   ? History of shingles   ? Hyperlipemia   ? Migraine headache   ? Obstructive sleep apnea   ? uses CPAP  ? OSA on CPAP   ? Paroxysmal atrial fibrillation ([facility])   ? Restrictive cardiomyopathy ([place])   ? Tremor   ? Tubal pregnancy   ? ?Past Surgical History:  ?Procedure Laterality Date  ? BREAST EXCISIONAL BIOPSY Left   ? benign  ? CARDIAC CATHETERIZATION    ? CARDIOVERSION    ? CHOLECYSTECTOMY    ? [place]  ? EYE SURGERY    ? left eye injections x2-was having "stroke like" flashes of light  ? heart biopsy  2008  ? [facility]  ? HYSTEROSCOPY  6/05  ? and D&C-simple hyperplasia secondary PMP bleeding  ? ROTATOR CUFF REPAIR    ? bilateral  ?  ? ?A ?IV Location/Drains/Wounds ?Patient Lines/Drains/Airways Status   ? ? Active Line/Drains/Airways   ? ? Name Placement date Placement time Site Days  ? Peripheral IV 04/16/21 20 G Left Antecubital 04/16/21  1124  Antecubital  1  ? ?  ?  ? ?  ? ? ?Intake/Output Last 24 hours ? ?Intake/Output Summary (Last 24 hours) at 04/17/2021 1107 ?Last data filed at 04/17/2021 [phone] ?Gross per 24 hour  ?Intake 2565.05 ml  ?Output --  ?Net 2565.05 ml  ? ? ?Labs/Imaging ?Results for orders placed or performed during the hospital encounter of 04/16/21 (from the past 48 hour(s))  ?CBC with Differential     Status: Abnormal  ? Collection Time: 04/16/21 11:43 AM  ?Result Value Ref Range  ? WBC 11.2 (H) 4.0 - 10.5 K/uL  ? RBC 4.58 3.87 - 5.11 MIL/uL  ? Hemoglobin 13.3 12.0 - 15.0 g/dL  ? HCT 40.8 36.0 - 46.0 %  ?  MCV 89.1 80.0 - 100.0 fL  ? MCH 29.0 26.0 - 34.0 pg  ? MCHC 32.6 30.0 - 36.0 g/dL  ? RDW 14.3 11.5 - 15.5 %  ? Platelets 243 150 - 400 K/uL  ? nRBC 0.0 0.0 - 0.2 %  ? Neutrophils Relative % 80 %  ? Neutro Abs 8.8 (H) 1.7 - 7.7 K/uL  ? Lymphocytes Relative 14 %  ? Lymphs Abs 1.6 0.7 - 4.0 K/uL  ? Monocytes Relative 5 %  ? Monocytes Absolute 0.6 0.1 - 1.0 K/uL  ? Eosinophils Relative 1 %  ? Eosinophils Absolute 0.1 0.0 - 0.5 K/uL  ? Basophils Relative 0 %  ? Basophils Absolute 0.0 0.0 - 0.1 K/uL  ? Immature Granulocytes 0 %  ? Abs Immature Granulocytes 0.04 0.00 - 0.07 K/uL  ?  Comment: Performed at [facility], [place] [address] [place], [place] [address]  ?Comprehensive metabolic panel     Status: Abnormal  ? Collection Time: 04/16/21 11:43 AM  ?Result Value Ref Range  ? Sodium 140 135 - 145 mmol/L  ? Potassium 4.1 3.5 - 5.1 mmol/L  ? Chloride 108 98 - 111 mmol/L  ? CO2  25 22 - 32 mmol/L  ? Glucose, Bld 107 (H) 70 - 99 mg/dL  ?  Comment: Glucose reference range applies only to samples taken after fasting for at least 8 hours.  ? BUN 15 8 - 23 mg/dL  ? Creatinine, Ser 0.79 0.44 - 1.00 mg/dL  ? Calcium 9.0 8.9 - 10.3 mg/dL  ? Total Protein 5.9 (L) 6.5 - 8.1 g/dL  ? Albumin 3.2 (L) 3.5 - 5.0 g/dL  ? AST 21 15 - 41 U/L  ? ALT 11 0 - 44 U/L  ? Alkaline Phosphatase 91 38 - 126 U/L  ? Total Bilirubin 0.5 0.3 - 1.2 mg/dL  ? GFR, Estimated >60 >60 mL/min  ?  Comment: (NOTE) ?Calculated using the CKD-EPI Creatinine Equation (2021) ?  ? Anion gap 7 5 - 15  ?  Comment: Performed at [facility], [place] [address], [place], [place] [address]  ?Troponin I (High Sensitivity)     Status: None  ? Collection Time: 04/16/21 11:43 AM  ?Result Value Ref Range  ? Troponin I (High Sensitivity) 9 <18 ng/L  ?  Comment: (NOTE) ?Elevated high sensitivity troponin I (hsTnI) values and significant  ?changes across serial measurements may suggest ACS but many other  ?chronic and acute conditions are known to elevate hsTnI results.  ?Refer to the "Links" section for chest pain algorithms and additional  ?guidance. ?Performed at [facility], [place] [address] [place], [place] ?[address] ?  ?Troponin I (High Sensitivity)     Status: None  ? Collection Time: 04/16/21  1:28 PM  ?Result Value Ref Range  ? Troponin I (High Sensitivity) 9 <18 ng/L  ?  Comment: (NOTE) ?Elevated high sensitivity troponin I (hsTnI) values and significant  ?changes across serial measurements may suggest ACS but many other  ?chronic and acute conditions are known to elevate hsTnI results.  ?Refer to the "Links" section for chest pain algorithms and additional  ?guidance. ?Performed at [facility], [place] [address], [place], [place] ?[address] ?  ?HIV Antibody (routine testing w rflx)     Status: None  ? Collection Time: 04/16/21  5:46 PM  ?Result Value Ref Range  ? HIV Screen 4th Generation wRfx Non Reactive Non Reactive  ?   Comment: Performed at  [facility], [place] [address], [place], [place] [address]  ?TSH     Status: None  ? Collection Time: 04/16/21  5:46 PM  ?Result Value Ref Range  ? TSH 1.469 0.350 - 4.500 uIU/mL  ?  Comment: Performed by a 3rd Generation assay with a functional sensitivity of <=0.01 uIU/mL. ?Performed at [facility], [place] [address] [place], [place] [address] ?  ?Magnesium     Status: None  ? Collection Time: 04/16/21  5:46 PM  ?Result Value Ref Range  ? Magnesium 2.3 1.7 - 2.4 mg/dL  ?  Comment: Performed at [facility], [place] [address] [place], [place] [address]  ?CBG monitoring, ED     Status: None  ? Collection Time: 04/17/21  5:57 AM  ?Result Value Ref Range  ? Glucose-Capillary 90 70 - 99 mg/dL  ?  Comment: Glucose reference range applies only to samples taken after fasting for at least 8 hours.  ? Comment 1 Notify RN   ? Comment 2 Document in Chart   ?Basic metabolic panel     Status: None  ? Collection Time: 04/17/21  6:03 AM  ?Result Value Ref Range  ? Sodium 141 135 - 145 mmol/L  ? Potassium 4.0 3.5 - 5.1 mmol/L  ? Chloride 110 98 - 111 mmol/L  ? CO2 26 22 - 32 mmol/L  ? Glucose, Bld 98 70 - 99 mg/dL  ?  Comment: Glucose reference range applies only to samples taken after fasting for at least 8 hours.  ? BUN 12 8 - 23 mg/dL  ? Creatinine, Ser 0.77 0.44 - 1.00 mg/dL  ? Calcium 9.0 8.9 - 10.3 mg/dL  ? GFR, Estimated >60 >60 mL/min  ?  Comment: (NOTE) ?Calculated using the CKD-EPI Creatinine Equation (2021) ?  ? Anion gap 5 5 - 15  ?  Comment: Performed at [facility], [place] [address] [place], [place] [address]  ?CBC     Status: None  ? Collection Time: 04/17/21  6:03 AM  ?Result Value Ref Range  ? WBC 7.2 4.0 - 10.5 K/uL  ? RBC 4.40 3.87 - 5.11 MIL/uL  ? Hemoglobin 12.6 12.0 - 15.0 g/dL  ? HCT 40.0 36.0 - 46.0 %  ? MCV 90.9 80.0 - 100.0 fL  ? MCH 28.6 26.0 - 34.0 pg  ? MCHC 31.5 30.0 - 36.0 g/dL  ? RDW 14.4 11.5 - 15.5 %  ? Platelets 209 150 - 400 K/uL  ? nRBC 0.0 0.0 - 0.2 %   ?  Comment: Performed at [facility], [place] [address], [place], [place] [address]  ?CBG monitoring, ED     Status: Abnormal  ? Collection Time: 04/17/21  8:33 AM  ?Result Value Ref Range  ? Glucose-Ca

## 2021-04-17 NOTE — Evaluation (Signed)
Physical Therapy Evaluation ?Patient Details ?Name: [name] ?MRN: [id number] ?DOB: 12-Jul-1950 ?Today's Date: 04/17/2021 ? ?History of Present Illness ? Pt is a 71 yo female presenting to the ED 04/16/21 following a syncopal event at home where she was found down after experiencing chest tightness and SOB. Imaging is negative. Pt did strike head and has laceration near L eye. Found to have A-fib. PMH includes A-fib, diastolic CHF, restrictive cardiomyopathy, migraines, and OSA on CPAP. ?  ?Clinical Impression ? Patient presented to the ED on 04/16/21 after a LOB at home and a fall consistent with patient's diagnosis of syncope and A-fib. Pt's impairments include decreased safety awareness, strength, and balance that are limiting her ability to safely and independently transfer, ambulate, and navigate stairs. Patient mod I to min guard A with all functional mobility. Pt BLEs shaky with ambulation with use of RW for BUE support. Her HR was stable with mobility. Pt reports she will follow up with MD about vertigo, however, pt may benefit from vestibular PT once cleared by MD. PT will continue to follow acutely to maximize pt's safety and independence with functional mobility. ?   ?   ? ?Recommendations for follow up therapy are one component of a multi-disciplinary discharge planning process, led by the attending physician.  Recommendations may be updated based on patient status, additional functional criteria and insurance authorization. ? ?Follow Up Recommendations Other (comment) (vestibular PT for vertigo once cleared by MD.) ? ?  ?Assistance Recommended at Discharge Intermittent Supervision/Assistance  ?Patient can return home with the following ? A little help with walking and/or transfers;A little help with bathing/dressing/bathroom;Assist for transportation;Help with stairs or ramp for entrance ? ?  ?Equipment Recommendations None recommended by PT  ?Recommendations for Other Services ?    ?  ?Functional  Status Assessment Patient has had a recent decline in their functional status and demonstrates the ability to make significant improvements in function in a reasonable and predictable amount of time.  ? ?  ?Precautions / Restrictions Precautions ?Precautions: Fall ?Restrictions ?Weight Bearing Restrictions: No  ? ?  ? ?Mobility ? Bed Mobility ?Overal bed mobility: Modified Independent ?  ?  ?  ?  ?  ?  ?General bed mobility comments: pt mod I to sit at EOB with use of hand rails. She needed increased time to come to sitting. ?  ? ?Transfers ?Overall transfer level: Needs assistance ?Equipment used: Rolling walker (2 wheels) ?Transfers: Sit to/from Stand ?Sit to Stand: Min guard ?  ?  ?  ?  ?  ?General transfer comment: pt required min guard for safety and steadying during transfer due to LE weakness and shakiness. Pt required cues for hand placement and knowledge of use of RW with transfers to and from toilet. ?  ? ?Ambulation/Gait ?Ambulation/Gait assistance: Min guard ?Gait Distance (Feet): 15 Feet ?Assistive device: Rolling walker (2 wheels) ?Gait Pattern/deviations: Step-through pattern, Decreased stride length ?Gait velocity: decreased ?Gait velocity interpretation: 1.31 - 2.62 ft/sec, indicative of limited community ambulator ?  ?General Gait Details: pt required min guard A for safety and steadying with ambulation to and from bathroom. She had BLE shakiness during mobility tasks ? ?Stairs ?  ?  ?  ?  ?  ? ?Wheelchair Mobility ?  ? ?Modified Rankin (Stroke Patients Only) ?  ? ?  ? ?Balance Overall balance assessment: Needs assistance ?Sitting-balance support: No upper extremity supported, Feet supported ?Sitting balance-Leahy Scale: Fair ?Sitting balance - Comments: Pt able to sit without support  but preferred UE support due to history of vertigo and being in bed for a couple days. ?  ?Standing balance support: During functional activity, Reliant on assistive device for balance, Bilateral upper extremity  supported ?Standing balance-Leahy Scale: Fair ?Standing balance comment: pt requires UE support using RW with standing and ambulation ?  ?  ?  ?  ?  ?  ?  ?  ?  ?  ?  ?   ? ? ? ?Pertinent Vitals/Pain Pain Assessment ?Pain Assessment: Faces ?Faces Pain Scale: No hurt  ? ? ?Home Living Family/patient expects to be discharged to:: Private residence ?Living Arrangements: Spouse/significant other ?Available Help at Discharge: Family;Available 24 hours/day ?Type of Home: House ?Home Access: Stairs to enter ?Entrance Stairs-Rails: None ?Entrance Stairs-Number of Steps: 2 ?Alternate Level Stairs-Number of Steps: flight ?Home Layout: Two level;Able to live on main level with bedroom/bathroom ?Home Equipment: Other (comment) (walking stick) ?   ?  ?Prior Function Prior Level of Function : Independent/Modified Independent ?  ?  ?  ?  ?  ?  ?Mobility Comments: pt uses walking stick occassionally; reports she uses the walls and furniture in the house when she has vertigo. ?ADLs Comments: able to bathe, dress, and feed herself ?  ? ? ?Hand Dominance  ?   ? ?  ?Extremity/Trunk Assessment  ? Upper Extremity Assessment ?Upper Extremity Assessment: Overall WFL for tasks assessed ?  ? ?Lower Extremity Assessment ?Lower Extremity Assessment: Generalized weakness ?  ? ?Cervical / Trunk Assessment ?Cervical / Trunk Assessment: Normal  ?Communication  ? Communication: HOH  ?Cognition Arousal/Alertness: Awake/alert ?Behavior During Therapy: [facility] for tasks assessed/performed ?Overall Cognitive Status: Within Functional Limits for tasks assessed ?  ?  ?  ?  ?  ?  ?  ?  ?  ?  ?  ?  ?  ?  ?  ?  ?  ?  ?  ? ?  ?General Comments General comments (skin integrity, edema, etc.): HR remained below 75bpm with mobility. Pt also had BUE and BLE tremors; per patient, tremors are at their baseline and greatly increase when her medicines wear off. ? ?  ?Exercises    ? ?Assessment/Plan  ?  ?PT Assessment Patient needs continued PT services  ?PT Problem List  Decreased strength;Decreased activity tolerance;Decreased balance;Decreased mobility;Decreased coordination;Decreased knowledge of use of DME;Decreased safety awareness ? ?   ?  ?PT Treatment Interventions DME instruction;Gait training;Stair training;Functional mobility training;Therapeutic activities;Therapeutic exercise;Balance training;Neuromuscular re-education;Patient/family education   ? ?PT Goals (Current goals can be found in the Care Plan section)  ?Acute Rehab PT Goals ?Patient Stated Goal: to go home ?PT Goal Formulation: With patient/family ?Time For Goal Achievement: 05/01/21 ?Potential to Achieve Goals: Good ? ?  ?Frequency Min 3X/week ?  ? ? ?Co-evaluation   ?  ?  ?  ?  ? ? ?  ?AM-PAC PT "6 Clicks" Mobility  ?Outcome Measure Help needed turning from your back to your side while in a flat bed without using bedrails?: None ?Help needed moving from lying on your back to sitting on the side of a flat bed without using bedrails?: None ?Help needed moving to and from a bed to a chair (including a wheelchair)?: A Little ?Help needed standing up from a chair using your arms (e.g., wheelchair or bedside chair)?: A Little ?Help needed to walk in hospital room?: A Little ?Help needed climbing 3-5 steps with a railing? : A Lot ?6 Click Score: 19 ? ?  ?End of  Session Equipment Utilized During Treatment: Gait belt ?Activity Tolerance: Patient tolerated treatment well ?Patient left: in bed;with call bell/phone within reach;with family/visitor present;with bed alarm set ?Nurse Communication: Mobility status ?PT Visit Diagnosis: Unsteadiness on feet (R26.81);Other abnormalities of gait and mobility (R26.89);Muscle weakness (generalized) (M62.81);Dizziness and giddiness (R42) ?  ? ?Time: [id number] ?PT Time Calculation (min) (ACUTE ONLY): 22 min ? ? ?Charges:   PT Evaluation ?$PT Eval Low Complexity: 1 Low ?  ?  ?   ? ? ?Jonne Ply, SPT ? ?Jonne Ply ?04/17/2021, 5:03 PM ? ?

## 2021-04-17 NOTE — Progress Notes (Signed)
Feels well this morning.  Still in atrial fibrillation.  She ate breakfast this morning but we are able to attempt cardioversion at 4 PM. ?Transthoracic echo is unremarkable with normal EF. ?Etiology of syncope is still somewhat obscure.  Could be a combination of weight loss, lower blood pressures, atrial fibrillation with decreased cardiac output leading to her 2 syncopal episodes.  There is an outside chance that she has tachybradycardia syndrome and has fainted because of transient bradycardia, transient polymorphic VT related to dofetilide, or potentially even a neurally mediated mechanism. ?After successful cardioversion, if all goes well she can probably be discharged later today. ?We will need an EP consultation to consider ablation (and potentially removed dofetilide therapy) +/- loop recorder to further define etiology of syncope. ? ?

## 2021-04-17 NOTE — Progress Notes (Addendum)
? ?The patient has been seen in conjunction with Rick Duff, MD. All aspects of care have been considered and discussed. The patient has been personally interviewed, examined, and all clinical data has been reviewed. ? ?See my earlier note. ? ? ?Progress Note ? ?Patient Name: [name] ?Date of Encounter: 04/17/2021 ? ?[facility] Cardiologist: Mertie Moores, MD  ? ?Subjective  ? ?Continues to have palpitations that are persistent. No chest pain or shortness of breath.  ? ?Inpatient Medications  ?  ?Scheduled Meds: ? atorvastatin  20 mg Oral Daily  ? dofetilide  375 mcg Oral BID  ? ezetimibe  10 mg Oral Daily  ? gabapentin  900 mg Oral QHS  ? rivaroxaban  20 mg Oral Q supper  ? sodium chloride flush  3 mL Intravenous Q12H  ? ?Continuous Infusions: ? ?PRN Meds: ?acetaminophen **OR** acetaminophen, albuterol, metoCLOPramide, metoprolol tartrate, polyvinyl alcohol  ? ?Vital Signs  ?  ?Vitals:  ? 04/17/21 0900 04/17/21 0930 04/17/21 1100 04/17/21 1130  ?BP: 107/85 112/66 118/73 117/67  ?Pulse: 84 83 81 79  ?Resp:  17 14 15   ?Temp:      ?TempSrc:      ?SpO2: 96% 99% 96% 96%  ?Weight:      ?Height:      ? ? ?Intake/Output Summary (Last 24 hours) at 04/17/2021 1201 ?Last data filed at 04/17/2021 [phone] ?Gross per 24 hour  ?Intake 2565.05 ml  ?Output --  ?Net 2565.05 ml  ? ? ?  04/16/2021  ? 11:27 AM 04/04/2021  ?  8:59 AM 04/02/2021  ?  9:04 AM  ?Last 3 Weights  ?Weight (lbs) 199 lb 8 oz 202 lb 201 lb  ?Weight (kg) 90.493 kg 91.627 kg 91.173 kg  ?   ? ?Telemetry  ?  ?Atrial fibrillation, HR mostly 80s to 90s. Occasional PVCs- Personally Reviewed ? ?ECG  ?  ?No new EKG- Personally Reviewed ? ?Physical Exam  ?GEN: No acute distress.   ?Neck: No JVD ?Cardiac: irregularly irregular, no murmurs, rubs, or gallops.  ?Respiratory: Clear to auscultation bilaterally. ?GI: Soft, nontender, non-distended  ?MS: No edema; No deformity. ?Neuro:  Nonfocal  ?Psych: Normal affect  ? ?Labs  ?  ?High Sensitivity Troponin:    ?Recent Labs  ?Lab 04/16/21 ?1143 04/16/21 ?1328  ?TROPONINIHS 9 9  ?   ?Chemistry ?Recent Labs  ?Lab 04/16/21 ?1143 04/16/21 ?1746 04/17/21 ?0603  ?NA 140  --  141  ?K 4.1  --  4.0  ?CL 108  --  110  ?CO2 25  --  26  ?GLUCOSE 107*  --  98  ?BUN 15  --  12  ?CREATININE 0.79  --  0.77  ?CALCIUM 9.0  --  9.0  ?MG  --  2.3  --   ?PROT 5.9*  --   --   ?ALBUMIN 3.2*  --   --   ?AST 21  --   --   ?ALT 11  --   --   ?ALKPHOS 91  --   --   ?BILITOT 0.5  --   --   ?GFRNONAA >60  --  >60  ?ANIONGAP 7  --  5  ?  ?Lipids No results for input(s): CHOL, TRIG, HDL, LABVLDL, LDLCALC, CHOLHDL in the last 168 hours.  ?Hematology ?Recent Labs  ?Lab 04/16/21 ?1143 04/17/21 ?0603  ?WBC 11.2* 7.2  ?RBC 4.58 4.40  ?HGB 13.3 12.6  ?HCT 40.8 40.0  ?MCV 89.1 90.9  ?MCH 29.0 28.6  ?MCHC  32.6 31.5  ?RDW 14.3 14.4  ?PLT 243 209  ? ?Thyroid  ?Recent Labs  ?Lab 04/16/21 ?1746  ?TSH 1.469  ?  ?BNPNo results for input(s): BNP, PROBNP in the last 168 hours.  ?DDimer No results for input(s): DDIMER in the last 168 hours.  ? ?Radiology  ?  ?CT HEAD WO CONTRAST (5MM) ? ?Result Date: 04/16/2021 ?CLINICAL DATA:  Trauma, fall EXAM: CT HEAD WITHOUT CONTRAST TECHNIQUE: Contiguous axial images were obtained from the base of the skull through the vertex without intravenous contrast. RADIATION DOSE REDUCTION: This exam was performed according to the departmental dose-optimization program which includes automated exposure control, adjustment of the mA and/or kV according to patient size and/or use of iterative reconstruction technique. COMPARISON:  None. FINDINGS: Brain: No acute intracranial findings are seen in noncontrast CT brain. Cortical sulci are prominent. Ventricles are not dilated. There is no shift of midline structures. There are no epidural or subdural fluid collections. Vascular: Unremarkable. Skull: No fracture is seen in the calvarium. Hyperostosis frontalis interna is seen. Sinuses/Orbits: There is calcific density in the anterior aspect of  right maxillary antrum, possibly osteoma. Other: None. IMPRESSION: No acute intracranial findings are seen in noncontrast CT brain. Atrophy. Electronically Signed   By: Elmer Picker M.D.   On: 04/16/2021 12:07  ? ?DG Chest Portable 1 View ? ?Result Date: 04/16/2021 ?CLINICAL DATA:  Syncope in a 71 year old female. EXAM: PORTABLE CHEST 1 VIEW COMPARISON:  April 03, 2020. FINDINGS: EKG leads project over the patient's chest. Image rotated slightly to the RIGHT. Cardiomediastinal contours and hilar structures are stable. Lungs are clear.  No sign of effusion on frontal radiograph. On limited assessment there is no acute skeletal process. IMPRESSION: No acute cardiopulmonary disease. Electronically Signed   By: Zetta Bills M.D.   On: 04/16/2021 11:53  ? ?ECHOCARDIOGRAM COMPLETE ? ?Result Date: 04/17/2021 ?   ECHOCARDIOGRAM REPORT   Patient Name:   [name] Date of Exam: 04/17/2021 Medical Rec #:  [id number]           Height:       69.5 in Accession #:    [id number]          Weight:       199.5 lb Date of Birth:  [birth year]           BSA:          2.074 m? Patient Age:    71 years            BP:           123/73 mmHg Patient Gender: F                   HR:           78 bpm. Exam Location:  Inpatient Procedure: 2D Echo, Cardiac Doppler and Color Doppler Indications:    R55 SYNCOPE  History:        Patient has prior history of Echocardiogram examinations, most                 recent 01/28/2020. CHF and Cardiomyopathy, Arrythmias:Atrial                 Fibrillation; Risk Factors:Dyslipidemia. RESTRICTIVE                 CARDIOMYOPATHY.  Sonographer:    Beryle Beams Referring Phys: [id number] [facility]  1. Left ventricular ejection fraction, by estimation, is 55 to 60%. The left  ventricle has normal function. Left ventricular endocardial border not optimally defined to evaluate regional wall motion. Left ventricular diastolic function could not be evaluated.  2. Right ventricular systolic function  is normal. The right ventricular size is mildly enlarged. There is normal pulmonary artery systolic pressure. The estimated right ventricular systolic pressure is 99991111 mmHg.  3. The mitral valve is grossly normal. No evidence of mitral valve regurgitation. No evidence of mitral stenosis.  4. The aortic valve is tricuspid. Aortic valve regurgitation is not visualized. No aortic stenosis is present.  5. The inferior vena cava is normal in size with greater than 50% respiratory variability, suggesting right atrial pressure of 3 mmHg. Comparison(s): No significant change from prior study. FINDINGS  Left Ventricle: Left ventricular ejection fraction, by estimation, is 55 to 60%. The left ventricle has normal function. Left ventricular endocardial border not optimally defined to evaluate regional wall motion. The left ventricular internal cavity size was normal in size. There is no left ventricular hypertrophy. Left ventricular diastolic function could not be evaluated due to atrial fibrillation. Left ventricular diastolic function could not be evaluated. Right Ventricle: The right ventricular size is mildly enlarged. No increase in right ventricular wall thickness. Right ventricular systolic function is normal. There is normal pulmonary artery systolic pressure. The tricuspid regurgitant velocity is 2.35  m/s, and with an assumed right atrial pressure of 3 mmHg, the estimated right ventricular systolic pressure is 99991111 mmHg. Left Atrium: Left atrial size was normal in size. Right Atrium: Right atrial size was normal in size. Pericardium: There is no evidence of pericardial effusion. Mitral Valve: The mitral valve is grossly normal. No evidence of mitral valve regurgitation. No evidence of mitral valve stenosis. Tricuspid Valve: The tricuspid valve is grossly normal. Tricuspid valve regurgitation is mild . No evidence of tricuspid stenosis. Aortic Valve: The aortic valve is tricuspid. Aortic valve regurgitation is not  visualized. No aortic stenosis is present. Aortic valve mean gradient measures 3.0 mmHg. Aortic valve peak gradient measures 6.2 mmHg. Aortic valve area, by VTI measures 1.51 cm?. Pulmonic Valve: The pulmonic valv

## 2021-04-17 NOTE — ED Notes (Signed)
This tech performed orthostatic vitals. Pt pulse while standing went to 143. Pt voiced no concerns or showed no signs of discomfort. Assisted pt with bedside commode while standing. Pt helped back into bed. Call bell in reach ?

## 2021-04-17 NOTE — Progress Notes (Signed)
? ?  Echocardiogram ?2D Echocardiogram has been performed. ? ?Festus Barren ?04/17/2021, 10:10 AM ?

## 2021-04-17 NOTE — Care Management Obs Status (Signed)
MEDICARE OBSERVATION STATUS NOTIFICATION ? ? ?Patient Details  ?Name: [name] ?MRN: [id number] ?Date of Birth: 1950/12/01 ? ? ?Medicare Observation Status Notification Given:  Yes ? ? ? ?Leone Haven, RN ?04/17/2021, 4:01 PM ?

## 2021-04-17 NOTE — TOC Transition Note (Addendum)
Transition of Care (TOC) - CM/SW Discharge Note ? ? ?Patient Details  ?Name: [name] ?MRN: [id number] ?Date of Birth: Dec 19, 1950 ? ?Transition of Care (TOC) CM/SW Contact:  ?Leone Haven, RN ?Phone Number: ?04/17/2021, 4:03 PM ? ? ?Clinical Narrative:    ?Patient is for dc today, she has no needs. Spouse is at bedside and will be transporting her home. Patient does not want outpatient vestibular pt.  ? ? ?  ?  ? ? ?Patient Goals and CMS Choice ?  ?  ?  ? ?Discharge Placement ?  ?           ?  ?  ?  ?  ? ?Discharge Plan and Services ?  ?  ?           ?  ?  ?  ?  ?  ?  ?  ?  ?  ?  ? ?Social Determinants of Health (SDOH) Interventions ?  ? ? ?Readmission Risk Interventions ?   ? View : No data to display.  ?  ?  ?  ? ? ? ? ? ?

## 2021-04-23 ENCOUNTER — Encounter: Payer: Self-pay | Admitting: Cardiovascular Disease

## 2021-04-23 DIAGNOSIS — R251 Tremor, unspecified: Secondary | ICD-10-CM | POA: Diagnosis not present

## 2021-04-23 DIAGNOSIS — E860 Dehydration: Secondary | ICD-10-CM | POA: Diagnosis not present

## 2021-04-23 DIAGNOSIS — N1831 Chronic kidney disease, stage 3a: Secondary | ICD-10-CM | POA: Diagnosis not present

## 2021-04-23 DIAGNOSIS — I425 Other restrictive cardiomyopathy: Secondary | ICD-10-CM | POA: Diagnosis not present

## 2021-04-23 DIAGNOSIS — I5189 Other ill-defined heart diseases: Secondary | ICD-10-CM | POA: Diagnosis not present

## 2021-04-23 DIAGNOSIS — I480 Paroxysmal atrial fibrillation: Secondary | ICD-10-CM | POA: Diagnosis not present

## 2021-04-23 DIAGNOSIS — I2721 Secondary pulmonary arterial hypertension: Secondary | ICD-10-CM | POA: Diagnosis not present

## 2021-04-23 DIAGNOSIS — M25512 Pain in left shoulder: Secondary | ICD-10-CM | POA: Diagnosis not present

## 2021-04-23 DIAGNOSIS — Z7901 Long term (current) use of anticoagulants: Secondary | ICD-10-CM | POA: Diagnosis not present

## 2021-04-23 NOTE — Progress Notes (Signed)
could go okay ? ?Cardiology Office Note ? ? ?Date:  04/24/2021  ? ?ID:  JAEDA YELINEK, DOB 1950-08-07, MRN [id number] ? ?PCP:  Shon Baton, MD  ?Cardiologist:   Mertie Moores, MD  ? ?Chief Complaint  ?Patient presents with  ? Atrial Fibrillation  ? Congestive Heart Failure  ?   ? ?  ? ?1. Atrial fibrillation ?2. Hyperlipidemia ?3. Diastolic congestive heart failure ? ?71 year old female with a history of atrial fibrillation. She's been well controlled on Tikosyn. She also has a history of hyperlipidemia and is on Lipitor. She's been on chronic Pradaxa therapy for anticoagulation. ? ?February 20, 2012 ?[name]  has done well since I last saw her. She retired from the [employer] system last summer. She has occasional palpitations but overall she's doing quite well.  She is exercising ( water aerobics) twice a week. ? ?Feb. 24, 2015: ? ?[name] is doing well.   No problems.  Maintaining NSR. ? ?Sept. 16, 2015: ? ?She was in the ER this past week.  CP, dyspnea .. CT angio was negative for PE - showed ? Of pneumonia.   Was treated wit Abx. ?Does well in the am's .[name] and perhaps more short of breath in the afternoons. ? ? ?April 05, 2014: ?[name] is a 71 y.o. female who presents for follow-up of her atrial fibrillation. ?She has continued to have some DOE,  ?Is concerned about the atorvastatin. Was seen in the lipid clinic.   ? ?Sept. 30, 2016: ? ?Doing well.  ?No CP , no dyspnea. ?Has lost 11 lbs since last year.  ?Is in NSR ? ?April 10, 2015: ? ?[name] is doing ok from a cardiology standpoint. ?Has had some dental issues  ? ?Sept.  22, 2017: ? ?[name] is seen for follow up of her atrial fib.   ? ?No  CP or dyspnea. ?No further episodes of atrial fib.  ? ?Dec. 11, 2017: ? ?[name] is seen today for followup for her atrial fib  ?Has some DOE  ?Not exercising as much as she needs to  ?No CP  ? ?Jan. 16, 2018: [name] is seen today as a work in visit at the request of Dr. Virgina Jock. ?Been having  more shortness of breath and was thought to have worsening congestive heart failure.  ?Has severe DOE with any exertion. ?We performed a cardiac cath in 2008 which revealed normal coronaries.   She went to [facility] and had a heart biopsy.   Was not told of any abnormality. ? ?Does have dull chest pain with exertion and with sitting  ? ?She had an echocardiogram performed yesterday which revealed normal left ventricular systolic function. She does have grade 2 diastolic dysfunction. ?Her Lasix was increased last week.   She has not noticed any increase in urine output.  No change in her breathing .  ?Has noticed that she loses her breath if she does any exertion .  ?Cannot lay supine - has severe dyspnea.  ? ?April 02, 2016: ? ?[name] is seen today for follow up visit .    Seen with husband , Jenny Reichmann .  ?She had an episode of PAF yesterday  - lasted for several hours.   Is back in NSR currently .  ?Had an episode in Feb. That lasted 2-3 hours , HR of 129 ,  She was very short of breath with that.  ? ?-Sleep study on Feb. 20 shows mild OSA ?-PFTs 01/2016 showed ratio 72,  FVC 74%, FEV1 69%, TLC 91% and DLCO 73% suggesting moderate intraparenchymal restriction ? ?- echo shows normal LV systolic function with grade 2 diastolic dysfunction. She has an estimated PA pressure of 35. ? ?Has developed a tremor.   Is seeing a neurologist .  ? ?July 10, 2016: ? ?Had a CPX in March , ?Normal functional capacity compared to sedentary patients.  ?Still has some vocal issues ( when she gets fatigued, she loses her voice) struggles to talk  ?Able to get out and work in the garden ?Using CPAP - may have helped some  ? ?Dec. 19, 2018: ? ?[name] is seen today for follow up of her chronic diastolic CHF ?[name] was seen at [place] [facility] earlier this year.  She had a heart catheterization which revealed normal coronary arteries.  She has had a myocardial biopsy which was reportedly normal. ?History of paroxysmal atrial  fibrillation which seems to be well-controlled on  Tikosyn  ?PFTs has shown reduction of DLCO ( 73% pred.)  ? ?Is just getting over al cold  ?Has continued to have issues with vertigo  ?Not getting out to do any exercise  ? ?July 23, 2017: ? ?Doing well,  No CP or dyspnea. ?Gets short of breath with the heat.  ?Started getting some exercise, yoga .  ?No cardiac exercise  ?Is having some joint issues,    ? ?February 02, 2018: ?He is seen today for follow-up visit.  She has a history of atrial fibrillation. ?She remains in normal sinus rhythm on Tikosyn.   She had an episode of paroxysmal atrial fibrillation this past Thursday.  The episode lasted for about 3 hours.   She took 1 of [name]'s propranolol tablets and went back into sinus rhythm fairly quickly.  Her QT intervals have remained good. ? ?Sept. 15, 2020  ? ?[name] is seen today . ?caregiving for [name]'s sister.  ?Wt. Today is  222 ( previous wt from Jan. 2020 was 241 lbs)  ? ?Walking some , eating fewer carbs.  ? ?April 13, 2019 ? ?[name] is seen today for follow up of her atrial fib and chronic diastilic CHF ?Wt is 225 lbs.  ( up 3 lbs )  ?Has chronic diastolic CHF ?Had a spinal injection last week..   Still has back and leg pain with walking  ?Has not been able to exercise.   ? ? ?Nov. 16, 2021 ?[name] is seen today for follow up of her chronic CHF, PAF  ?Wt today is 225 lbs.  ? ?Had an episode of PAF several weeks ago.   Lasted 2 days ?HR was 130s  ?Made her fatigued.  ?Has been walking ,  Had lost some weight, now has gained it back  ?Her Afib started while she was walking up her driveway  ?Is on xarelto  ? ?August 03, 2020: ?[name] is seen today for follow-up of her chronic congestive heart failure, paroxysmal atrial fibrillation.  Her weight today is211 lbs.  ? ?She was seen by Laurann Montana, NP for worsening shortness of breath.  She has been on Tikosyn for her atrial fibrillation. ?Her Lasix was increased to 80 mg a day for 3 days then she resumed 40 mg  p.o. twice daily. ? ?She was last seen in A. fib clinic in March, 2022.  She had been maintaining sinus rhythm at that time. ? ?Is not able to do much due to severe DOE . ?Loses her breath with any activity .  ?Does an  activity , then has to sit down ?Does another activity , then has to sit down  ?Walks the dog for a few minutes - perhaps 30 mintues.  ?Had CPX in 2018 ;-  ? ?Normal coronary CTA in Feb. 2020 ?Coronary calcium score is 0 ? ? ?She had a cardiopulmonary stress test in 2018.  It revealed normal functional capacity.  She also had pulmonary function test around 2018.  It revealed mildly reduced diffusion capacity  ?Voice is normal when she is rested,  has a very difficult time speaking when she is fatigued.  ?Normal TSH in June, 2022 ? ?Echo Jan. 2022:   EF 60-65%.,  Grade 2 diastolic dysfunction. ?Mild mitral regurgitation. ? ?Saw Laurann Montana , NP on last June.  ?Trial of acid blockers to see if that would help with her hoarse voice ?No benefit according to [place] ? ?April 02, 2021 ?[name] is seen today for follow up of her chronic diastolic CHF ?Has mild MR ?She has a hx of episodic dyspnea and loses her voice regularly  ?I have encouraged her to discuss this with Dr Virgina Jock and a neurologist ? ?Had a sleep study.  Now has a different CPAP mask ( sees Dr. Elsworth Soho )  ?Wt is 201 lbs .  ? ?April 24, 2021 ?[name] is seen today for follow up of her chronic diastolic CHF, mild MR ?She was admitted to the hospital on April 16, 2021 with syncope - occurred at 10 AM . ?  She had developed atrial fib 2 days prior to her admission.   She had taken 2 propranolol prior to passing out  ?The syncope was likely orthostatic hypotension by hx . ?She had just been to the bathroom, stood up , took several steps  ?She had eaten breakfast, coffee, juice. ? ?Takes propranolol 10 mg BID ( breakfast and dinner ) for her tremor.  ? ?Has had 35 lb weight loss ?She was out for 45-60 seconds ?Was in atrial fib.    ?Was scheduled for  TEE /cv but she converted on her own ? ?She is scheduled to see [place] for consideration of implantable loop recorder  ? ?Wt is 200 lbs .  ?She has lost 35 lbs unintentionally . ?She has discussed with dr. Virgina Jock. ?

## 2021-04-24 ENCOUNTER — Encounter: Payer: Self-pay | Admitting: Cardiovascular Disease

## 2021-04-24 ENCOUNTER — Ambulatory Visit: Payer: Medicare PPO | Admitting: Cardiovascular Disease

## 2021-04-24 VITALS — BP 118/68 | HR 47 | Ht 69.0 in | Wt 200.8 lb

## 2021-04-24 DIAGNOSIS — I480 Paroxysmal atrial fibrillation: Secondary | ICD-10-CM

## 2021-04-24 DIAGNOSIS — I5032 Chronic diastolic (congestive) heart failure: Secondary | ICD-10-CM | POA: Diagnosis not present

## 2021-04-24 MED ORDER — PROPRANOLOL HCL 10 MG PO TABS: 10.0000 mg | ORAL_TABLET | Freq: Four times a day (QID) | ORAL | Status: AC | PRN

## 2021-04-24 NOTE — Patient Instructions (Signed)
Medication Instructions:  ?Your physician has recommended you make the following change in your medication:  ? ?1) STOP Lasix 40mg  ?2) STOP Potassium 20 meq ?3) CHANGE Propranolol 10 mg every 6 hours as needed ? ?*If you need a refill on your cardiac medications before your next appointment, please call your pharmacy* ? ?Lab Work: ?NONE ?If you have labs (blood work) drawn today and your tests are completely normal, you will receive your results only by: ?MyChart Message (if you have MyChart) OR ?A paper copy in the mail ?If you have any lab test that is abnormal or we need to change your treatment, we will call you to review the results. ? ?Testing/Procedures: ?NONE ? ?Follow-Up: ?At [facility], you and your health needs are our priority.  As part of our continuing mission to provide you with exceptional heart care, we have created designated Provider Care Teams.  These Care Teams include your primary Cardiologist (physician) and Advanced Practice Providers (APPs -  Physician Assistants and Nurse Practitioners) who all work together to provide you with the care you need, when you need it. ? ?Your next appointment:   ?2 month(s) ? ?The format for your next appointment:   ?In Person ? ?Provider:   ?[facility], PA-C or Chelsea Aus, PA-C   ? ?Other Instructions ?Your physician has referred you to our Electrophysiology Clinic. They will reach out to you to schedule an appointment. ?

## 2021-04-26 DIAGNOSIS — M47816 Spondylosis without myelopathy or radiculopathy, lumbar region: Secondary | ICD-10-CM | POA: Diagnosis not present

## 2021-04-30 DIAGNOSIS — H903 Sensorineural hearing loss, bilateral: Secondary | ICD-10-CM | POA: Diagnosis not present

## 2021-04-30 DIAGNOSIS — H9313 Tinnitus, bilateral: Secondary | ICD-10-CM | POA: Diagnosis not present

## 2021-05-04 ENCOUNTER — Ambulatory Visit (HOSPITAL_BASED_OUTPATIENT_CLINIC_OR_DEPARTMENT_OTHER): Payer: Medicare PPO | Admitting: Internal Medicine

## 2021-05-12 ENCOUNTER — Other Ambulatory Visit: Payer: Self-pay | Admitting: Cardiovascular Disease

## 2021-06-01 DIAGNOSIS — Z6829 Body mass index (BMI) 29.0-29.9, adult: Secondary | ICD-10-CM | POA: Diagnosis not present

## 2021-06-01 DIAGNOSIS — M461 Sacroiliitis, not elsewhere classified: Secondary | ICD-10-CM | POA: Diagnosis not present

## 2021-06-12 ENCOUNTER — Other Ambulatory Visit: Payer: Self-pay | Admitting: Cardiovascular Disease

## 2021-06-13 ENCOUNTER — Encounter: Payer: Self-pay | Admitting: Internal Medicine

## 2021-06-13 ENCOUNTER — Ambulatory Visit: Payer: Medicare PPO | Admitting: Internal Medicine

## 2021-06-13 VITALS — BP 112/6 | HR 58 | Ht 69.5 in | Wt 202.0 lb

## 2021-06-13 DIAGNOSIS — I5032 Chronic diastolic (congestive) heart failure: Secondary | ICD-10-CM | POA: Diagnosis not present

## 2021-06-13 DIAGNOSIS — I480 Paroxysmal atrial fibrillation: Secondary | ICD-10-CM

## 2021-06-13 DIAGNOSIS — R55 Syncope and collapse: Secondary | ICD-10-CM

## 2021-06-13 HISTORY — PX: OTHER SURGICAL HISTORY: SHX169

## 2021-06-13 NOTE — Patient Instructions (Signed)
Medication Instructions:  Your physician recommends that you continue on your current medications as directed. Please refer to the Current Medication list given to you today.  *If you need a refill on your cardiac medications before your next appointment, please call your pharmacy*   Lab Work: None ordered   Testing/Procedures: None ordered   Follow-Up: At [facility], you and your health needs are our priority.  As part of our continuing mission to provide you with exceptional heart care, we have created designated Provider Care Teams.  These Care Teams include your primary Cardiologist (physician) and Advanced Practice Providers (APPs -  Physician Assistants and Nurse Practitioners) who all work together to provide you with the care you need, when you need it.   Your next appointment:   2 month(s)  The format for your next appointment:   In Person  Provider:   You will follow up in the [facility] located at [facility]. Your provider will be: Roderic Palau, NP or Clint R. Fenton, PA-C{  Thank you for choosing [facility]!!   [phone]  Other Instructions   Important Information About Sugar       Implantable Loop Recorder Placement, Care After This sheet gives you information about how to care for yourself after your procedure. Your health care provider may also give you more specific instructions. If you have problems or questions, contact your health care provider. What can I expect after the procedure? After the procedure, it is common to have: Soreness or discomfort near the incision. Some swelling or bruising near the incision.  Follow these instructions at home: Incision care  Monitor your cardiac device site for redness, swelling, and drainage. Call the device clinic at [phone] if you experience these symptoms or fever/chills.  Keep the large square bandage on your site for 24 hours and then you may remove it  yourself. Keep the steri-strips underneath in place.   You may shower after 24 hours with the steri-strips in place. They will usually fall off on their own, or may be removed after 10 days. Pat dry.   Avoid lotions, ointments, or perfumes over your incision until it is well-healed.  Please do not submerge in water until your site is completely healed.   Your device is MRI compatible.   Remote monitoring is used to monitor your cardiac device from home. This monitoring is scheduled every month by our office. It allows [place] to keep an eye on the function of your device to ensure it is working properly.  If your wound site starts to bleed apply pressure.      If you have any questions/concerns please call the device clinic at [phone].  Activity  Return to your normal activities.  General instructions Follow instructions from your health care provider about how to manage your implantable loop recorder and transmit the information. Learn how to activate a recording if this is necessary for your type of device. You may go through a metal detection gate, and you may let someone hold a metal detector over your chest. Show your ID card if needed. Do not have an MRI unless you check with your health care provider first. Take over-the-counter and prescription medicines only as told by your health care provider. Keep all follow-up visits as told by your health care provider. This is important. Contact a health care provider if: You have redness, swelling, or pain around your incision. You have a fever. You have pain that is not  relieved by your pain medicine. You have triggered your device because of fainting (syncope) or because of a heartbeat that feels like it is racing, slow, fluttering, or skipping (palpitations). Get help right away if you have: Chest pain. Difficulty breathing. Summary After the procedure, it is common to have soreness or discomfort near the incision. Change your  dressing as told by your health care provider. Follow instructions from your health care provider about how to manage your implantable loop recorder and transmit the information. Keep all follow-up visits as told by your health care provider. This is important. This information is not intended to replace advice given to you by your health care provider. Make sure you discuss any questions you have with your health care provider. Document Released: 12/19/2014 Document Revised: 02/22/2017 Document Reviewed: 02/22/2017 Elsevier Patient Education  2020 [employer].

## 2021-06-13 NOTE — Progress Notes (Signed)
PCP: Creola Corn, MD Primary Cardiologist: Dr Elease Hashimoto Primary EP: Dr Johney Frame  [name] is a 71 y.o. female who presents today for electrophysiology followup.  Since last being seen in our clinic, the patient reports doing reasonably well.  She has had more than 30 lb weight loss.  She continues to have occasional afib.  She had hospitalization in march (personally reviewed) with syncope.  No objective findings or cause was found.  She was felt to likely have orthostasis as the cause.  Today, she denies symptoms of palpitations, chest pain, shortness of breath,  lower extremity edema, dizziness, recurrent syncope.  The patient is otherwise without complaint today.   Past Medical History:  Diagnosis Date   Atrial fibrillation (HCC)    Back pain    nerve problems-had series of three injections   Bone spur    Spine   Chronic anticoagulation    Diastolic congestive heart failure (HCC)    Hearing loss    History of shingles    Hyperlipemia    Migraine headache    Obstructive sleep apnea    uses CPAP   OSA on CPAP    Paroxysmal atrial fibrillation (HCC)    Restrictive cardiomyopathy (HCC)    Tremor    Tubal pregnancy    Past Surgical History:  Procedure Laterality Date   BREAST EXCISIONAL BIOPSY Left    benign   CARDIAC CATHETERIZATION     CARDIOVERSION     CHOLECYSTECTOMY     ECTOPIC PREGNANCY SURGERY  1980   EYE SURGERY     left eye injections x2-was having "stroke like" flashes of light   heart biopsy  2008   [facility]   HYSTEROSCOPY  6/05   and D&C-simple hyperplasia secondary PMP bleeding   ROTATOR CUFF REPAIR     bilateral    ROS- all systems are reviewed and negatives except as per HPI above  Current Outpatient Medications  Medication Sig Dispense Refill   albuterol (VENTOLIN HFA) 108 (90 Base) MCG/ACT inhaler Inhale 2 puffs into the lungs every 6 (six) hours as needed for wheezing or shortness of breath. 18 g 12   atorvastatin (LIPITOR) 20 MG tablet TAKE 1  TABLET BY MOUTH DAILY AT 6PM 90 tablet 1   calcium-vitamin D (OSCAL WITH D) 500-200 MG-UNIT tablet Take 1 tablet by mouth.     dofetilide (TIKOSYN) 125 MCG capsule Take 3 capsules (375 mcg total) by mouth 2 (two) times daily. 540 capsule 3   ezetimibe (ZETIA) 10 MG tablet TAKE 1 TABLET EVERY DAY 90 tablet 3   gabapentin (NEURONTIN) 600 MG tablet Take 900 mg by mouth at bedtime.     Multiple Vitamins-Minerals (PRESERVISION AREDS 2 PO) Take 1 capsule by mouth 2 (two) times daily.     Polyethyl Glycol-Propyl Glycol (SYSTANE OP) Apply 1 drop to eye daily as needed (dry eyes).     propranolol (INDERAL) 10 MG tablet Take 1 tablet (10 mg total) by mouth every 6 (six) hours as needed (for palpitations).     spironolactone (ALDACTONE) 25 MG tablet TAKE 1 TABLET EVERY DAY 90 tablet 2   XARELTO 20 MG TABS tablet TAKE 1 TABLET BY MOUTH DAILY WITH SUPPER 90 tablet 1   No current facility-administered medications for this visit.    Physical Exam: Vitals:   06/13/21 0905  BP: (!) 112/6  Pulse: (!) 58  SpO2: 96%  Weight: 202 lb (91.6 kg)  Height: 5' 9.5" (1.765 m)    GEN-  The patient is well appearing, alert and oriented x 3 today.   Head- normocephalic, atraumatic Eyes-  Sclera clear, conjunctiva pink Ears- hearing intact Oropharynx- clear Lungs- Clear to ausculation bilaterally, normal work of breathing Heart- Regular rate and rhythm, no murmurs, rubs or gallops, PMI not laterally displaced GI- soft, NT, ND, + BS Extremities- no clubbing, cyanosis, or edema  Wt Readings from Last 3 Encounters:  06/13/21 202 lb (91.6 kg)  04/24/21 200 lb 12.8 oz (91.1 kg)  04/17/21 204 lb 5.9 oz (92.7 kg)    EKG tracing ordered today is personally reviewed and shows sinus rhythm 58 bpm, Qtc 414 msec.  Assessment and Plan:  Paroxysmal atrial fibrillation Reasonably well controlled with tikosyn Repeat bmet, mg on return to the AF clinic We will monitor her AF burden by ILR and use burden to  guide further therapy.  2. Recurrent syncope Unclear etiology, though likely due to orthostasis.  Echo shows no structural heart disease.   Qt has been stable. Monitor 10/22 reviewed I would therefore advise implantation of an implantable loop recorder for long term arrhythmia monitoring and also to further evaluate syncope.  Risks and benefits to ILR were discussed at length with the patient today, including but not limited to risks of bleeding and infection.  Extensive device education was performed.  Remote monitoring was also discussed at length today.  The patient understands and wishes to proceed.  We will proceed at this time with ILR implantation.  3. Diastolic dysfunction Clinically stable No changes  [name] MD, [facility] 06/13/2021 9:12 AM      DESCRIPTION OF PROCEDURE:  Informed written consent was obtained.  The patient required no sedation for the procedure today.  The patients left chest was prepped and draped. Mapping over the patient's chest was performed to identify the appropriate ILR site.  This area was found to be the left parasternal region over the 3rd-4th intercostal space.  The skin overlying this region was infiltrated with lidocaine for local analgesia.  A 0.5-cm incision was made at the implant site.  A subcutaneous ILR pocket was fashioned using a combination of sharp and blunt dissection.  A Medtronic Reveal Linq model LNQ 22 (SN [id number]) implantable loop recorder was then placed into the pocket R waves were very prominent and measured > 0.2 mV. EBL<1 ml.  Steri- Strips and a sterile dressing were then applied.  There were no early apparent complications.     CONCLUSIONS:   1. Successful implantation of a Medtronic Reveal LINQ implantable loop recorder for syncope  2. No early apparent complications.   No driving x 6 months post syncope (Pt aware)  Follow-up in AF clinic in 2 months and with Dr Elease Hashimoto as scheduled  [name] MD, [facility] 06/13/2021 9:12  AM

## 2021-06-14 DIAGNOSIS — R9089 Other abnormal findings on diagnostic imaging of central nervous system: Secondary | ICD-10-CM | POA: Diagnosis not present

## 2021-06-14 DIAGNOSIS — M461 Sacroiliitis, not elsewhere classified: Secondary | ICD-10-CM | POA: Diagnosis not present

## 2021-06-22 DIAGNOSIS — M5416 Radiculopathy, lumbar region: Secondary | ICD-10-CM | POA: Diagnosis not present

## 2021-06-22 DIAGNOSIS — M461 Sacroiliitis, not elsewhere classified: Secondary | ICD-10-CM | POA: Diagnosis not present

## 2021-06-22 DIAGNOSIS — M6281 Muscle weakness (generalized): Secondary | ICD-10-CM | POA: Diagnosis not present

## 2021-06-27 DIAGNOSIS — M5416 Radiculopathy, lumbar region: Secondary | ICD-10-CM | POA: Diagnosis not present

## 2021-06-27 DIAGNOSIS — M461 Sacroiliitis, not elsewhere classified: Secondary | ICD-10-CM | POA: Diagnosis not present

## 2021-06-27 DIAGNOSIS — M6281 Muscle weakness (generalized): Secondary | ICD-10-CM | POA: Diagnosis not present

## 2021-06-29 DIAGNOSIS — M6281 Muscle weakness (generalized): Secondary | ICD-10-CM | POA: Diagnosis not present

## 2021-06-29 DIAGNOSIS — M461 Sacroiliitis, not elsewhere classified: Secondary | ICD-10-CM | POA: Diagnosis not present

## 2021-06-29 DIAGNOSIS — M5416 Radiculopathy, lumbar region: Secondary | ICD-10-CM | POA: Diagnosis not present

## 2021-07-02 DIAGNOSIS — M6281 Muscle weakness (generalized): Secondary | ICD-10-CM | POA: Diagnosis not present

## 2021-07-02 DIAGNOSIS — M461 Sacroiliitis, not elsewhere classified: Secondary | ICD-10-CM | POA: Diagnosis not present

## 2021-07-02 DIAGNOSIS — M5416 Radiculopathy, lumbar region: Secondary | ICD-10-CM | POA: Diagnosis not present

## 2021-07-02 NOTE — Progress Notes (Signed)
Cardiology Office Note:    Date:  07/03/2021   ID:  [name], DOB [birth year], MRN [id number]  PCP:  Creola Corn, MD  [facility] Providers Cardiologist:  Kristeen Miss, MD    Referring MD: Creola Corn, MD   Chief Complaint:  F/u for CHF, AFib    Patient Profile: Persistent atrial fibrillation Rx: Dofetilide  (HFpEF) heart failure with preserved ejection fraction  Syncope S/p ILR in 05/2021  Hyperlipidemia Cardiac catheterization in 2008: Normal coronary arteries CCTA in 2020:  CAC score 0, no CAD  Mild mitral regurgitation  Sleep apnea Tremor Chronic shortness of breath  Possible neuromuscular disease as a cause (no cause found by pulmonology)  Prior CV Studies: ECHO COMPLETE WO IMAGING ENHANCING AGENT 04/17/2021 EF 55-60, normal RVSF, RVSP 25.1, normal PASP  LONG TERM MONITOR (8-14 DAYS) INTERPRETATION 11/01/2020  Sinus rhythm  Rare episodes of atrial tachycardia  No atrial fib observed.   CT CORONARY MORPH W/CTA COR W/SCORE W/CA 02/25/2018 Calcium Score: 0 Agatston units. Coronary Arteries: Right dominant with no anomalies LM: No plaque or stenosis. LAD system:  No plaque or stenosis. Circumflex system: Moderate ramus. No plaque or stenosis in the LCx system. RCA system: No plaque or stenosis. IMPRESSION: 1. Coronary artery calcium score 0 Agatston units, suggesting low risk for future cardiac events. 2.  No significant coronary disease noted.    CPET 04/18/16 Conclusion: Exercise testing with gas exchange demonstrates normal functional capacity when compared to matched sedentary norms. There is no clear indication for cardiopulmonary limitation. At peak exercise and with flat O2 pulse pattern, patient is likely limited due to diastolic dysfunction as a result of [name] body habitus. Expect weight loss and regular exercise would greatly improve exercise intolerance.  GATED SPECT MYO PERF W/LEXISCAN STRESS 2D 02/15/2016 EF: 75%. Normal pharmacologic  nuclear stress test. No ischemia. There is a small fixed defect in the apical wall most probably sec to shifting breast artifact.  History of Present Illness:   [name] is a 71 y.o. [name] with the above problem list.  [name] was last seen by Dr. Elease Hashimoto in April 2023 after an admission with syncope and recurrent AFib.  [name] converted back to NSR on [name] own.  [name] propranolol was changed to as needed.  [name] had lost a lot of weight and there was concern for orthostasis as a cause of syncope.  [name] Lasix and K+ was DC'd.  [name] saw Dr. Johney Frame and underwent implantation of an ILR.  [name] returns for f/u.  [name] is here with [name] husband.  Since stopping furosemide and potassium, [name] did have some increase swelling in [name] legs.  However, this resolved.  [name] breathing is unchanged.  [name] has not had chest pain, syncope, orthopnea.  [name] did have an abnormal MRI of [name] brain recently.  [name] primary care provider has arranged for follow-up in 2 to 3 months.        Past Medical History:  Diagnosis Date   Atrial fibrillation (HCC)    Back pain    nerve problems-had series of three injections   Bone spur    Spine   Chronic anticoagulation    Diastolic congestive heart failure (HCC)    Hearing loss    History of shingles    Hyperlipemia    Migraine headache    Obstructive sleep apnea    uses CPAP   OSA on CPAP    Paroxysmal atrial fibrillation (HCC)    Restrictive cardiomyopathy (HCC)  Tremor    Tubal pregnancy    Current Medications: Current Meds  Medication Sig   albuterol (VENTOLIN HFA) 108 (90 Base) MCG/ACT inhaler Inhale 2 puffs into the lungs every 6 (six) hours as needed for wheezing or shortness of breath.   atorvastatin (LIPITOR) 20 MG tablet TAKE 1 TABLET BY MOUTH DAILY AT 6PM   calcium-vitamin D (OSCAL WITH D) 500-200 MG-UNIT tablet Take 1 tablet by mouth. 1059mcg   dofetilide (TIKOSYN) 125 MCG capsule Take 3 capsules (375 mcg total) by mouth 2 (two) times daily.   ezetimibe (ZETIA)  10 MG tablet TAKE 1 TABLET EVERY DAY   gabapentin (NEURONTIN) 600 MG tablet Take 900 mg by mouth at bedtime.   Multiple Vitamins-Minerals (PRESERVISION AREDS 2 PO) Take 1 capsule by mouth 2 (two) times daily.   Polyethyl Glycol-Propyl Glycol (SYSTANE OP) Apply 1 drop to eye daily as needed (dry eyes).   propranolol (INDERAL) 10 MG tablet Take 1 tablet (10 mg total) by mouth every 6 (six) hours as needed (for palpitations).   spironolactone (ALDACTONE) 25 MG tablet TAKE 1 TABLET EVERY DAY   XARELTO 20 MG TABS tablet TAKE 1 TABLET BY MOUTH DAILY WITH SUPPER    Allergies:   Oxycodone, Topamax [topiramate], Codeine, Penicillins, and Tramadol   Social History   Tobacco Use   Smoking status: Never   Smokeless tobacco: Never  Vaping Use   Vaping Use: Never used  Substance Use Topics   Alcohol use: Not Currently   Drug use: No    Family Hx: [name] family history includes Atrial fibrillation in [name] mother and sister; Breast cancer in [name] sister; Cancer in [name] maternal grandmother and maternal uncle; Dementia in [name] father and sister; Heart disease in [name] sister; Hypertension in [name] father; Seizures in [name] father; Thyroid cancer in [name] sister.  Review of Systems  Gastrointestinal:  Negative for hematochezia.  Genitourinary:  Negative for hematuria.     EKGs/Labs/Other Test Reviewed:    EKG:  EKG is not ordered today.  The ekg ordered today demonstrates n/a  Recent Labs: 07/14/2020: NT-Pro BNP 237 04/16/2021: ALT 11; TSH 1.469 04/17/2021: BUN 12; Creatinine, Ser 0.77; Hemoglobin 12.6; Magnesium 2.1; Platelets 209; Potassium 4.0; Sodium 141   Recent Lipid Panel No results for input(s): "CHOL", "TRIG", "HDL", "VLDL", "LDLCALC", "LDLDIRECT" in the last 8760 hours.   Risk Assessment/Calculations/Metrics:    CHA2DS2-VASc Score = 3   This indicates a 3.2% annual risk of stroke. [name] score is based upon: CHF History: 1 HTN History: 0 Diabetes History: 0 Stroke History:  0 Vascular Disease History: 0 Age Score: 1 Gender Score: 1             Physical Exam:    VS:  BP 130/80   Pulse 63   Ht 5\' 9"  (1.753 m)   Wt 194 lb (88 kg)   LMP 01/21/1990   SpO2 97%   BMI 28.65 kg/m     Wt Readings from Last 3 Encounters:  07/03/21 194 lb (88 kg)  06/13/21 202 lb (91.6 kg)  04/24/21 200 lb 12.8 oz (91.1 kg)    Constitutional:      Appearance: Healthy appearance. Not in distress.  Neck:     Vascular: No JVR. JVD normal.  Pulmonary:     Effort: Pulmonary effort is normal.     Breath sounds: No wheezing. No rales.  Cardiovascular:     Normal rate. Regular rhythm. Normal S1. Normal S2.  Murmurs: There is no murmur.  Edema:    Peripheral edema absent.  Abdominal:     Palpations: Abdomen is soft.  Skin:    General: Skin is warm and dry.  Neurological:     Mental Status: Alert and oriented to person, place and time.         ASSESSMENT & PLAN:   (HFpEF) heart failure with preserved ejection fraction (HCC) [name] furosemide and potassium were stopped at last visit due to concerns for orthostatic hypotension as a cause for [name] syncope.  [name] has been doing well since stopping the furosemide.  [name] has had chronic shortness of breath for some time now of questionable etiology.  This is unchanged.  [name] volume status appears stable on exam.  [name] has lost a lot of weight and [name] BP was lower.  It is fairly stable now.  Continue Spironolactone 25 mg once daily. F/u in 6 mos.   Persistent atrial fibrillation (HCC) Maintaining normal sinus rhythm on exam.  [name] is tolerating anticoagulation.  CrCl 93 mL/min.  Continue Rivaroxaban 20 mg once daily.  Creatinine, K+, Mg2+ normal in March 2023.  QTc was also ok on EKG in May 2023.  Continue Dofetilide 375 mcg twice daily.   Syncope Unexplained.  [name] is s/p ILR.  [name] has not had any further syncopal episodes since [name] ILR was implanted.             Dispo:  Return in about 6 months (around 01/02/2022) for Routine  follow up in 6 months with Dr. Acie Fredrickson.   Medication Adjustments/Labs and Tests Ordered: Current medicines are reviewed at length with the patient today.  Concerns regarding medicines are outlined above.  Tests Ordered: No orders of the defined types were placed in this encounter.  Medication Changes: No orders of the defined types were placed in this encounter.  Signed, Richardson Dopp, PA-C  07/03/2021 12:45 PM    [facility] [address], [place], [place]  [address] Phone: [phone]; Fax: [phone]

## 2021-07-03 ENCOUNTER — Encounter: Payer: Self-pay | Admitting: Physician Assistant

## 2021-07-03 ENCOUNTER — Ambulatory Visit: Payer: Medicare PPO | Admitting: Physician Assistant

## 2021-07-03 VITALS — BP 130/80 | HR 63 | Ht 69.0 in | Wt 194.0 lb

## 2021-07-03 DIAGNOSIS — I5032 Chronic diastolic (congestive) heart failure: Secondary | ICD-10-CM | POA: Diagnosis not present

## 2021-07-03 DIAGNOSIS — R55 Syncope and collapse: Secondary | ICD-10-CM | POA: Diagnosis not present

## 2021-07-03 DIAGNOSIS — I4819 Other persistent atrial fibrillation: Secondary | ICD-10-CM

## 2021-07-03 NOTE — Patient Instructions (Signed)
Medication Instructions:   Your physician recommends that you continue on your current medications as directed. Please refer to the Current Medication list given to you today.   *If you need a refill on your cardiac medications before your next appointment, please call your pharmacy*   Lab Work:  None ordered  If you have labs (blood work) drawn today and your tests are completely normal, you will receive your results only by: MyChart Message (if you have MyChart) OR A paper copy in the mail If you have any lab test that is abnormal or we need to change your treatment, we will call you to review the results.   Testing/Procedures:  None ordered   Follow-Up: At [facility], you and your health needs are our priority.  As part of our continuing mission to provide you with exceptional heart care, we have created designated Provider Care Teams.  These Care Teams include your primary Cardiologist (physician) and Advanced Practice Providers (APPs -  Physician Assistants and Nurse Practitioners) who all work together to provide you with the care you need, when you need it.  We recommend signing up for the patient portal called "MyChart".  Sign up information is provided on this After Visit Summary.  MyChart is used to connect with patients for Virtual Visits (Telemedicine).  Patients are able to view lab/test results, encounter notes, upcoming appointments, etc.  Non-urgent messages can be sent to your provider as well.   To learn more about what you can do with MyChart, go to [email or web address].    Your next appointment:   6 month(s)  The format for your next appointment:   In Person  Provider:   Kristeen Miss, MD    Important Information About Sugar

## 2021-07-03 NOTE — Assessment & Plan Note (Signed)
Her furosemide and potassium were stopped at last visit due to concerns for orthostatic hypotension as a cause for her syncope.  She has been doing well since stopping the furosemide.  She has had chronic shortness of breath for some time now of questionable etiology.  This is unchanged.  Her volume status appears stable on exam.  She has lost a lot of weight and her BP was lower.  It is fairly stable now.  Continue Spironolactone 25 mg once daily. F/u in 6 mos.

## 2021-07-03 NOTE — Assessment & Plan Note (Addendum)
Maintaining normal sinus rhythm on exam.  She is tolerating anticoagulation.  CrCl 93 mL/min.  Continue Rivaroxaban 20 mg once daily.  Creatinine, K+, Mg2+ normal in March 2023.  QTc was also ok on EKG in May 2023.  Continue Dofetilide 375 mcg twice daily.

## 2021-07-03 NOTE — Assessment & Plan Note (Signed)
Unexplained.  She is s/p ILR.  She has not had any further syncopal episodes since her ILR was implanted.

## 2021-07-05 DIAGNOSIS — M461 Sacroiliitis, not elsewhere classified: Secondary | ICD-10-CM | POA: Diagnosis not present

## 2021-07-05 DIAGNOSIS — M6281 Muscle weakness (generalized): Secondary | ICD-10-CM | POA: Diagnosis not present

## 2021-07-05 DIAGNOSIS — M5416 Radiculopathy, lumbar region: Secondary | ICD-10-CM | POA: Diagnosis not present

## 2021-07-09 DIAGNOSIS — M5416 Radiculopathy, lumbar region: Secondary | ICD-10-CM | POA: Diagnosis not present

## 2021-07-09 DIAGNOSIS — M461 Sacroiliitis, not elsewhere classified: Secondary | ICD-10-CM | POA: Diagnosis not present

## 2021-07-09 DIAGNOSIS — M6281 Muscle weakness (generalized): Secondary | ICD-10-CM | POA: Diagnosis not present

## 2021-07-12 DIAGNOSIS — M5416 Radiculopathy, lumbar region: Secondary | ICD-10-CM | POA: Diagnosis not present

## 2021-07-12 DIAGNOSIS — M6281 Muscle weakness (generalized): Secondary | ICD-10-CM | POA: Diagnosis not present

## 2021-07-12 DIAGNOSIS — Z6829 Body mass index (BMI) 29.0-29.9, adult: Secondary | ICD-10-CM | POA: Diagnosis not present

## 2021-07-12 DIAGNOSIS — M461 Sacroiliitis, not elsewhere classified: Secondary | ICD-10-CM | POA: Diagnosis not present

## 2021-07-16 ENCOUNTER — Ambulatory Visit (INDEPENDENT_AMBULATORY_CARE_PROVIDER_SITE_OTHER): Payer: Medicare PPO

## 2021-07-16 DIAGNOSIS — R55 Syncope and collapse: Secondary | ICD-10-CM

## 2021-07-16 DIAGNOSIS — M5416 Radiculopathy, lumbar region: Secondary | ICD-10-CM | POA: Diagnosis not present

## 2021-07-16 DIAGNOSIS — M6281 Muscle weakness (generalized): Secondary | ICD-10-CM | POA: Diagnosis not present

## 2021-07-16 DIAGNOSIS — M461 Sacroiliitis, not elsewhere classified: Secondary | ICD-10-CM | POA: Diagnosis not present

## 2021-07-17 LAB — CUP PACEART REMOTE DEVICE CHECK
Date Time Interrogation Session: 20230626183324
Implantable Pulse Generator Implant Date: 20230524

## 2021-07-18 DIAGNOSIS — M461 Sacroiliitis, not elsewhere classified: Secondary | ICD-10-CM | POA: Diagnosis not present

## 2021-07-18 DIAGNOSIS — M6281 Muscle weakness (generalized): Secondary | ICD-10-CM | POA: Diagnosis not present

## 2021-07-18 DIAGNOSIS — M5416 Radiculopathy, lumbar region: Secondary | ICD-10-CM | POA: Diagnosis not present

## 2021-07-25 ENCOUNTER — Telehealth: Payer: Self-pay | Admitting: Diagnostic Neuroimaging

## 2021-07-25 ENCOUNTER — Other Ambulatory Visit: Payer: Self-pay | Admitting: Obstetrics & Gynecology

## 2021-07-25 ENCOUNTER — Other Ambulatory Visit: Payer: Self-pay | Admitting: Internal Medicine

## 2021-07-25 DIAGNOSIS — M461 Sacroiliitis, not elsewhere classified: Secondary | ICD-10-CM | POA: Diagnosis not present

## 2021-07-25 DIAGNOSIS — M5416 Radiculopathy, lumbar region: Secondary | ICD-10-CM | POA: Diagnosis not present

## 2021-07-25 DIAGNOSIS — Z1231 Encounter for screening mammogram for malignant neoplasm of breast: Secondary | ICD-10-CM

## 2021-07-25 DIAGNOSIS — M6281 Muscle weakness (generalized): Secondary | ICD-10-CM | POA: Diagnosis not present
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
## 2021-07-30 DIAGNOSIS — M6281 Muscle weakness (generalized): Secondary | ICD-10-CM | POA: Diagnosis not present

## 2021-07-30 DIAGNOSIS — M461 Sacroiliitis, not elsewhere classified: Secondary | ICD-10-CM | POA: Diagnosis not present

## 2021-07-30 DIAGNOSIS — M5416 Radiculopathy, lumbar region: Secondary | ICD-10-CM | POA: Diagnosis not present

## 2021-07-31 ENCOUNTER — Ambulatory Visit
Admission: RE | Admit: 2021-07-31 | Discharge: 2021-07-31 | Disposition: A | Discharge status: Home / Self Care | Payer: Medicare PPO | Source: Ambulatory Visit | Attending: Internal Medicine | Admitting: Internal Medicine

## 2021-07-31 DIAGNOSIS — Z1231 Encounter for screening mammogram for malignant neoplasm of breast: Secondary | ICD-10-CM

## 2021-08-02 DIAGNOSIS — M461 Sacroiliitis, not elsewhere classified: Secondary | ICD-10-CM | POA: Diagnosis not present

## 2021-08-02 DIAGNOSIS — M6281 Muscle weakness (generalized): Secondary | ICD-10-CM | POA: Diagnosis not present

## 2021-08-02 DIAGNOSIS — M5416 Radiculopathy, lumbar region: Secondary | ICD-10-CM | POA: Diagnosis not present

## 2021-08-08 DIAGNOSIS — M461 Sacroiliitis, not elsewhere classified: Secondary | ICD-10-CM | POA: Diagnosis not present

## 2021-08-08 DIAGNOSIS — M5416 Radiculopathy, lumbar region: Secondary | ICD-10-CM | POA: Diagnosis not present

## 2021-08-08 DIAGNOSIS — M6281 Muscle weakness (generalized): Secondary | ICD-10-CM | POA: Diagnosis not present

## 2021-08-09 NOTE — Progress Notes (Signed)
Carelink Summary Report / Loop Recorder 

## 2021-08-10 DIAGNOSIS — M5416 Radiculopathy, lumbar region: Secondary | ICD-10-CM | POA: Diagnosis not present

## 2021-08-10 DIAGNOSIS — M461 Sacroiliitis, not elsewhere classified: Secondary | ICD-10-CM | POA: Diagnosis not present

## 2021-08-10 DIAGNOSIS — M6281 Muscle weakness (generalized): Secondary | ICD-10-CM | POA: Diagnosis not present

## 2021-08-14 DIAGNOSIS — H903 Sensorineural hearing loss, bilateral: Secondary | ICD-10-CM | POA: Diagnosis not present

## 2021-08-14 DIAGNOSIS — H9313 Tinnitus, bilateral: Secondary | ICD-10-CM | POA: Diagnosis not present

## 2021-08-20 ENCOUNTER — Ambulatory Visit (INDEPENDENT_AMBULATORY_CARE_PROVIDER_SITE_OTHER): Payer: Medicare PPO

## 2021-08-20 DIAGNOSIS — R55 Syncope and collapse: Secondary | ICD-10-CM | POA: Diagnosis not present

## 2021-08-20 LAB — CUP PACEART REMOTE DEVICE CHECK
Date Time Interrogation Session: 20230729183142
Implantable Pulse Generator Implant Date: 20230524

## 2021-08-21 ENCOUNTER — Encounter (HOSPITAL_COMMUNITY): Payer: Self-pay | Admitting: Nurse Practitioner

## 2021-08-21 ENCOUNTER — Ambulatory Visit (HOSPITAL_COMMUNITY)
Admission: RE | Admit: 2021-08-21 | Discharge: 2021-08-21 | Disposition: A | Discharge status: Home / Self Care | Payer: Medicare PPO | Source: Ambulatory Visit | Attending: Nurse Practitioner | Admitting: Nurse Practitioner

## 2021-08-21 VITALS — BP 130/78 | HR 53 | Ht 69.0 in | Wt 199.0 lb

## 2021-08-21 DIAGNOSIS — I480 Paroxysmal atrial fibrillation: Secondary | ICD-10-CM | POA: Diagnosis not present

## 2021-08-21 DIAGNOSIS — G4733 Obstructive sleep apnea (adult) (pediatric): Secondary | ICD-10-CM | POA: Insufficient documentation

## 2021-08-21 DIAGNOSIS — R0602 Shortness of breath: Secondary | ICD-10-CM | POA: Diagnosis not present

## 2021-08-21 DIAGNOSIS — I4819 Other persistent atrial fibrillation: Secondary | ICD-10-CM

## 2021-08-21 DIAGNOSIS — D6869 Other thrombophilia: Secondary | ICD-10-CM | POA: Diagnosis not present

## 2021-08-21 DIAGNOSIS — Z7901 Long term (current) use of anticoagulants: Secondary | ICD-10-CM | POA: Insufficient documentation

## 2021-08-21 LAB — BASIC METABOLIC PANEL
Anion gap: 5 (ref 5–15)
BUN: 14 mg/dL (ref 8–23)
CO2: 29 mmol/L (ref 22–32)
Calcium: 9.4 mg/dL (ref 8.9–10.3)
Chloride: 107 mmol/L (ref 98–111)
Creatinine, Ser: 0.9 mg/dL (ref 0.44–1.00)
GFR, Estimated: >60 mL/min (ref >60)
Glucose, Bld: 100 mg/dL — ABNORMAL HIGH (ref 70–99)
Potassium: 4.4 mmol/L (ref 3.5–5.1)
Sodium: 141 mmol/L (ref 135–145)

## 2021-08-21 LAB — MAGNESIUM: Magnesium: 2.1 mg/dL (ref 1.7–2.4)

## 2021-08-21 NOTE — Progress Notes (Signed)
Primary Care Physician: Creola Corn, MD Referring Physician: Dr. Johney Frame Cardiologist: Dr. Elease Hashimoto Pulmonologist: Dr. Waldemar Dickens is a 71 y.o. female with a h/o paroxysmal afib that is in  the afib clinic for Tikosyn surveillance. She  had a run of Afib last  October that settled down with prn use of propanolol. She is in SR today, has not noted any heart irregularity. She is c/o shortness of breath with exertion and losing her voice form shortness of breath if she speaks too long. She had been sedentary most of the fall and winter, but is trying to walk 1/2 mile 2x a day. Her echo in January showed normal EF. CT coronary  from 02/2018 showed 0 calcium score and normal arteries. She does have bilateral hand tremors and Neurology appointment is pending in April. She sees Dr. Vassie Loll for surveillance of her OSA. She is using her CPAP. CT of chest was normal at that time.   F/u in afib clinic 08/21/21 per f/u at Dr. Jenel Lucks visit this past May.  She remains on tikosyn.She had an hospitalization in March for syncope, he thought may have been 2/2 orthostatic changes. He did discuss placement of a Linq monitor  and she  had that implanted shortly after.  She remains on tikosyn. Paceart report as of 7/29 showed one hour of afib with burden at 0.1%. She c/o of chronic shortness of breath. She has back issues and has to do activities in spurts. She also mentions that she is losing weight without trying but admits her appetite has fallen off. Her last echo in March showed a normal EF. No CAD by cardiac CT in the past.  She   is followed by Dr. Melburn Popper. Her PCP and Dr. Melburn Popper are aware of her symptoms.    Today, she denies symptoms of palpitations, chest pain, shortness of breath, orthopnea, PND, lower extremity edema, dizziness, presyncope, syncope, or neurologic sequela. The patient is tolerating medications without difficulties and is otherwise without complaint today.   Past Medical History:   Diagnosis Date   Atrial fibrillation (HCC)    Back pain    nerve problems-had series of three injections   Bone spur    Spine   Chronic anticoagulation    Diastolic congestive heart failure (HCC)    Hearing loss    History of shingles    Hyperlipemia    Migraine headache    Obstructive sleep apnea    uses CPAP   OSA on CPAP    Paroxysmal atrial fibrillation (HCC)    Restrictive cardiomyopathy (HCC)    Tremor    Tubal pregnancy    Past Surgical History:  Procedure Laterality Date   BREAST EXCISIONAL BIOPSY Left    benign   CARDIAC CATHETERIZATION     CARDIOVERSION     CHOLECYSTECTOMY     ECTOPIC PREGNANCY SURGERY  1980   EYE SURGERY     left eye injections x2-was having "stroke like" flashes of light   heart biopsy  2008   [facility]   HYSTEROSCOPY  06/2003   and D&C-simple hyperplasia secondary PMP bleeding   implantable loop recorder placement  06/13/2021   Medtronic Reveal Linq model LNQ 22 (SN [id number]) implantable loop recorder implanted for syncope and AF management   ROTATOR CUFF REPAIR     bilateral    Current Outpatient Medications  Medication Sig Dispense Refill   albuterol (VENTOLIN HFA) 108 (90 Base) MCG/ACT inhaler Inhale 2 puffs into the  lungs every 6 (six) hours as needed for wheezing or shortness of breath. 18 g 12   atorvastatin (LIPITOR) 20 MG tablet TAKE 1 TABLET BY MOUTH DAILY AT 6PM 90 tablet 1   calcium-vitamin D (OSCAL WITH D) 500-200 MG-UNIT tablet Take 1 tablet by mouth.     dofetilide (TIKOSYN) 125 MCG capsule Take 3 capsules (375 mcg total) by mouth 2 (two) times daily. 540 capsule 3   ezetimibe (ZETIA) 10 MG tablet TAKE 1 TABLET EVERY DAY 90 tablet 3   gabapentin (NEURONTIN) 600 MG tablet Take 900 mg by mouth at bedtime.     Multiple Vitamins-Minerals (PRESERVISION AREDS 2 PO) Take 1 capsule by mouth 2 (two) times daily.     Polyethyl Glycol-Propyl Glycol (SYSTANE OP) Apply 1 drop to eye daily as needed (dry eyes).     propranolol  (INDERAL) 10 MG tablet Take 1 tablet (10 mg total) by mouth every 6 (six) hours as needed (for palpitations).     spironolactone (ALDACTONE) 25 MG tablet TAKE 1 TABLET EVERY DAY 90 tablet 2   XARELTO 20 MG TABS tablet TAKE 1 TABLET BY MOUTH DAILY WITH SUPPER 90 tablet 1   No current facility-administered medications for this encounter.    Allergies  Allergen Reactions   Oxycodone Hives    Hives    Topamax [Topiramate] Other (See Comments)    hallucinations    Codeine Nausea And Vomiting    VERTIGO   Penicillins Rash   Tramadol     dizziness    Social History   Socioeconomic History   Marital status: Married    Spouse name: [name]   Number of children: 0   Years of education: college   Highest education level: Not on file  Occupational History    Comment: retired  Tobacco Use   Smoking status: Never   Smokeless tobacco: Never  Vaping Use   Vaping Use: Never used  Substance and Sexual Activity   Alcohol use: Not Currently   Drug use: No   Sexual activity: Not Currently    Birth control/protection: Post-menopausal  Other Topics Concern   Not on file  Social History Narrative   Patient lives in [place] with her husband [name])  and she is retired. College education.   Caffeine- 2-3 cups tea daily .   Social Determinants of Health   Financial Resource Strain: Not on file  Food Insecurity: Not on file  Transportation Needs: Not on file  Physical Activity: Not on file  Stress: Not on file  Social Connections: Not on file  Intimate Partner Violence: Not on file    Family History  Problem Relation Age of Onset   Hypertension Father    Dementia Father    Seizures Father    Atrial fibrillation Mother    Cancer Maternal Grandmother        mouth   Cancer Maternal Uncle        unknown type   Thyroid cancer Sister    Breast cancer Sister        breast cancer   Heart disease Sister    Atrial fibrillation Sister    Dementia Sister     ROS- All systems are  reviewed and negative except as per the HPI above  Physical Exam: There were no vitals filed for this visit.  Wt Readings from Last 3 Encounters:  07/03/21 88 kg  06/13/21 91.6 kg  04/24/21 91.1 kg    Labs: Lab Results  Component Value Date  NA 141 04/17/2021   K 4.0 04/17/2021   CL 110 04/17/2021   CO2 26 04/17/2021   GLUCOSE 98 04/17/2021   BUN 12 04/17/2021   CREATININE 0.77 04/17/2021   CALCIUM 9.0 04/17/2021   MG 2.1 04/17/2021   Lab Results  Component Value Date   INR 1.8 (H) 04/17/2021   Lab Results  Component Value Date   CHOL 134 07/23/2017   HDL 40 07/23/2017   LDLCALC 63 07/23/2017   TRIG 153 (H) 07/23/2017     GEN- The patient is well appearing, alert and oriented x 3 today.   Head- normocephalic, atraumatic Eyes-  Sclera clear, conjunctiva pink Ears- hearing intact Oropharynx- clear Neck- supple, no JVP Lymph- no cervical lymphadenopathy Lungs- Clear to ausculation bilaterally, normal work of breathing Heart- Regular rate and rhythm, no murmurs, rubs or gallops, PMI not laterally displaced GI- soft, NT, ND, + BS Extremities- no clubbing, cyanosis, or edema MS- no significant deformity or atrophy Skin- no rash or lesion Psych- euthymic mood, full affect Neuro- strength and sensation are intact  EKG-Vent. rate 53 BPM PR interval 142 ms QRS duration 76 ms QT/QTcB 428/401 ms P-R-T axes 63 43 53 Sinus bradycardia Low voltage QRS Borderline ECG When compared with ECG of 17-Apr-2021 14:22, PREVIOUS ECG IS PRESENT  Epic records reviewed   Assessment and Plan:  1. Afib  Staying in SR with tikosyn, minimal afib burden  Continue  dofetilide 125 mcg , 3 tabs am and pm Bmet/mag today   2. CHA2DS2VASc score of 3 Continue xarelto 20 mg daily    3. OSA Uses cpap Per Dr. Vassie Loll  4. C/o exertional shortness of breath  CXR  in March no active CP disease  Recent echo showed normal EF Calcium score in 2020 showed 0 calcium score with normal  coronaries  Reviewing records appears to be  chronic  Discuss with Dr. Elease Hashimoto again on appointment coming up in December, move appointment up if  pt wants to discuss sooner  Evaluated by Dr. Vassie Loll in the past and could not contribute to lungs  5. Unintentional weight loss F/u with PCP, possibly decline in appetite     F/u with Dr. Elease Hashimoto in May  as scheduled  Afib  clinic in 6 months for tikosyn surveillance   Lupita Leash [facility] [address] [place], [place] [address] [phone]

## 2021-08-24 DIAGNOSIS — J984 Other disorders of lung: Secondary | ICD-10-CM | POA: Diagnosis not present

## 2021-08-24 DIAGNOSIS — I480 Paroxysmal atrial fibrillation: Secondary | ICD-10-CM | POA: Diagnosis not present

## 2021-08-24 DIAGNOSIS — D6869 Other thrombophilia: Secondary | ICD-10-CM | POA: Diagnosis not present

## 2021-08-24 DIAGNOSIS — I425 Other restrictive cardiomyopathy: Secondary | ICD-10-CM | POA: Diagnosis not present

## 2021-08-24 DIAGNOSIS — N1831 Chronic kidney disease, stage 3a: Secondary | ICD-10-CM | POA: Diagnosis not present

## 2021-08-24 DIAGNOSIS — F418 Other specified anxiety disorders: Secondary | ICD-10-CM | POA: Diagnosis not present

## 2021-08-24 DIAGNOSIS — E669 Obesity, unspecified: Secondary | ICD-10-CM | POA: Diagnosis not present

## 2021-08-24 DIAGNOSIS — E785 Hyperlipidemia, unspecified: Secondary | ICD-10-CM | POA: Diagnosis not present

## 2021-08-24 DIAGNOSIS — M7989 Other specified soft tissue disorders: Secondary | ICD-10-CM | POA: Diagnosis not present

## 2021-08-28 ENCOUNTER — Ambulatory Visit: Payer: Medicare PPO | Admitting: Diagnostic Neuroimaging

## 2021-08-28 ENCOUNTER — Encounter: Payer: Self-pay | Admitting: Diagnostic Neuroimaging

## 2021-08-28 VITALS — BP 122/68 | HR 54 | Ht 69.0 in | Wt 198.0 lb

## 2021-08-28 DIAGNOSIS — G250 Essential tremor: Secondary | ICD-10-CM

## 2021-08-28 NOTE — Progress Notes (Signed)
[facility]  PATIENT: [name] DOB: [birth year]  REFERRING CLINICIAN: Jessee Avers HISTORY FROM: patient REASON FOR VISIT: follow up   HISTORICAL  CHIEF COMPLAINT:  Chief Complaint  Patient presents with   Follow-up    RM 7 alone Pt is well, tremors has worsen since last visit. Pt called 7/5 regarding propranolol.      HISTORY OF PRESENT ILLNESS:   UPDATE (08/28/21, VRP): Since last visit, doing worse with tremor.  Symptoms are progressive. Had another syncope in March 2023. Now on propranolol as needed. More stress.  UPDATE (12/05/20, VRP): Since last visit, doing well except had syncope event in pre-op area for husband surgery, in 10/02/20. Had prodromal sweating, heart racing, energy draining sensations. Occurred while sitting down. Then passed out and was taken to ER. Left before being seen officially, after waiting 13 hours.   UPDATE (05/19/20, VRP): Since last visit, tremor continues and now patient wants to try meds. Has propranolol as needed (rarely uses currently) for atrial fibrillation. Tremor worse with fatigue.  UPDATE 07/29/16: Since last visit, doing a little better. Now working in garden and around the home, and feeling better. Walking short distances at home (5-10 minutes). Tremor stable.   PRIOR HPI (03/25/16): 71 year old female here for evaluation of shortness of breath and dyspnea on exertion. Since January 2018 patient has had intermittent problems with shortness of breath and dyspnea on exertion, and has had thorough evaluation by pulmonary and cardiology physicians, without specific cause found. She does have history of atrial fibrillation, atrial flutter, congestive heart failure, but these do not appear to be explain patient's dyspnea symptoms adequately, and therefore possibility of an occult neuromuscular disease has been raised and therefore patient presenting to me for further evaluation. Patient requested for me to see her for this  problem due to her meeting me while I was treating one of her relatives.  Patient reports a variety of constitutional symptoms include chest pain, ringing in ears spinning sensation shortness of breath snoring tremor. Patient can feel shortness of breath when she exerts herself as well as at rest. Sometimes she thinks about a problem her symptoms can get worse. She denies any muscle twitching, muscle weakness focally.   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 Multiple family members on her father's side including father, sister, nephew and others have history of tremor. Some of them have tremor in chin, head and some in the hands. Patient has been tried on primidone in the past without relief and some aggravation of symptoms.    REVIEW OF SYSTEMS: Full 14 system review of systems performed and negative with exception of: hearing loss.   ALLERGIES: Allergies  Allergen Reactions   Oxycodone Hives    Hives    Topamax [Topiramate] Other (See Comments)    hallucinations    Codeine Nausea And Vomiting    VERTIGO   Penicillins Rash   Tramadol     dizziness    HOME MEDICATIONS: Outpatient Medications Prior to Visit  Medication Sig Dispense Refill   albuterol (VENTOLIN HFA) 108 (90 Base) MCG/ACT inhaler Inhale 2 puffs into the lungs every 6 (six) hours as needed for wheezing or shortness of breath. 18 g 12   atorvastatin (LIPITOR) 20 MG tablet TAKE 1 TABLET BY MOUTH DAILY AT 6PM 90 tablet 1   calcium-vitamin D (OSCAL WITH D) 500-200 MG-UNIT tablet Take 1 tablet by mouth.     dofetilide (  TIKOSYN) 125 MCG capsule Take 3 capsules (375 mcg total) by mouth 2 (two) times daily. 540 capsule 3   ezetimibe (ZETIA) 10 MG tablet TAKE 1 TABLET EVERY DAY 90 tablet 3   gabapentin (NEURONTIN) 600 MG tablet Take 900 mg by mouth at bedtime.     Multiple Vitamins-Minerals (PRESERVISION AREDS 2 PO) Take 1 capsule by mouth 2 (two) times  daily.     Polyethyl Glycol-Propyl Glycol (SYSTANE OP) Apply 1 drop to eye daily as needed (dry eyes).     propranolol (INDERAL) 10 MG tablet Take 1 tablet (10 mg total) by mouth every 6 (six) hours as needed (for palpitations).     spironolactone (ALDACTONE) 25 MG tablet TAKE 1 TABLET EVERY DAY 90 tablet 2   XARELTO 20 MG TABS tablet TAKE 1 TABLET BY MOUTH DAILY WITH SUPPER 90 tablet 1   No facility-administered medications prior to visit.    PAST MEDICAL HISTORY: Past Medical History:  Diagnosis Date   Atrial fibrillation (HCC)    Back pain    nerve problems-had series of three injections   Bone spur    Spine   Chronic anticoagulation    Diastolic congestive heart failure (HCC)    Hearing loss    History of shingles    Hyperlipemia    Migraine headache    Obstructive sleep apnea    uses CPAP   OSA on CPAP    Paroxysmal atrial fibrillation (HCC)    Restrictive cardiomyopathy (HCC)    Tremor    Tubal pregnancy     PAST SURGICAL HISTORY: Past Surgical History:  Procedure Laterality Date   BREAST EXCISIONAL BIOPSY Left    benign   CARDIAC CATHETERIZATION     CARDIOVERSION     CHOLECYSTECTOMY     ECTOPIC PREGNANCY SURGERY  1980   EYE SURGERY     left eye injections x2-was having "stroke like" flashes of light   heart biopsy  2008   [facility]   HYSTEROSCOPY  06/2003   and D&C-simple hyperplasia secondary PMP bleeding   implantable loop recorder placement  06/13/2021   Medtronic Reveal Linq model LNQ 22 (SN [id number]) implantable loop recorder implanted for syncope and AF management   ROTATOR CUFF REPAIR     bilateral    FAMILY HISTORY: Family History  Problem Relation Age of Onset   Hypertension Father    Dementia Father    Seizures Father    Atrial fibrillation Mother    Cancer Maternal Grandmother        mouth   Cancer Maternal Uncle        unknown type   Thyroid cancer Sister    Breast cancer Sister        breast cancer   Heart disease Sister    Atrial  fibrillation Sister    Dementia Sister     SOCIAL HISTORY:  Social History   Socioeconomic History   Marital status: Married    Spouse name: [name]   Number of children: 0   Years of education: college   Highest education level: Not on file  Occupational History    Comment: retired  Tobacco Use   Smoking status: Never   Smokeless tobacco: Never  Vaping Use   Vaping Use: Never used  Substance and Sexual Activity   Alcohol use: Not Currently   Drug use: No   Sexual activity: Not Currently    Birth control/protection: Post-menopausal  Other Topics Concern   Not on file  Social History  Narrative   Patient lives in [place] with her husband [name])  and she is retired. College education.   Caffeine- 2-3 cups tea daily .   Social Determinants of Health   Financial Resource Strain: Not on file  Food Insecurity: Not on file  Transportation Needs: Not on file  Physical Activity: Not on file  Stress: Not on file  Social Connections: Not on file  Intimate Partner Violence: Not on file     PHYSICAL EXAM  GENERAL EXAM/CONSTITUTIONAL: Vitals:  Vitals:   08/28/21 1319  BP: 122/68  Pulse: (!) 54  Weight: 198 lb (89.8 kg)  Height: 5\' 9"  (1.753 m)   Wt Readings from Last 3 Encounters:  08/28/21 198 lb (89.8 kg)  08/21/21 199 lb (90.3 kg)  07/03/21 194 lb (88 kg)   Body mass index is 29.24 kg/m. No results found. Patient is in no distress; well developed, nourished and groomed; neck is supple  CARDIOVASCULAR: Examination of carotid arteries is normal; no carotid bruits Regular rate and rhythm, no murmurs Examination of peripheral vascular system by observation and palpation is normal  EYES: Ophthalmoscopic exam of optic discs and posterior segments is normal; no papilledema or hemorrhages  MUSCULOSKELETAL: Gait, strength, tone, movements noted in Neurologic exam below  NEUROLOGIC: MENTAL STATUS:      No data to display         awake, alert, oriented to  person, place and time recent and remote memory intact normal attention and concentration language fluent, comprehension intact, naming intact,  fund of knowledge appropriate  CRANIAL NERVE:  2nd - no papilledema on fundoscopic exam 2nd, 3rd, 4th, 6th - pupils equal and reactive to light, visual fields full to confrontation, extraocular muscles intact, no nystagmus 5th - facial sensation symmetric 7th - facial strength symmetric 8th - hearing intact 9th - palate elevates symmetrically, uvula midline 11th - shoulder shrug symmetric 12th - tongue protrusion midline MILD HEAD TREMOR  MOTOR:  normal bulk and tone, full strength in the BUE, BLE MODERATE POSTURAL TREMOR IN BUE NO BRADYKINESIA  SENSORY:  normal and symmetric to light touch, temperature, vibration  COORDINATION:  finger-nose-finger, fine finger movements --> ACTION TREMOR  REFLEXES:  deep tendon reflexes present and symmetric TRACE AT ANKLES  GAIT/STATION:  narrow based gait    DIAGNOSTIC DATA (LABS, IMAGING, TESTING) - I reviewed patient records, labs, notes, testing and imaging myself where available.  Lab Results  Component Value Date   WBC 7.2 04/17/2021   HGB 12.6 04/17/2021   HCT 40.0 04/17/2021   MCV 90.9 04/17/2021   PLT 209 04/17/2021      Component Value Date/Time   NA 141 08/21/2021 1338   NA 141 10/10/2020 1130   K 4.4 08/21/2021 1338   CL 107 08/21/2021 1338   CO2 29 08/21/2021 1338   GLUCOSE 100 (H) 08/21/2021 1338   BUN 14 08/21/2021 1338   BUN 16 10/10/2020 1130   CREATININE 0.90 08/21/2021 1338   CREATININE 0.95 01/01/2016 0839   CALCIUM 9.4 08/21/2021 1338   PROT 5.9 (L) 04/16/2021 1143   PROT 7.0 07/23/2017 0847   ALBUMIN 3.2 (L) 04/16/2021 1143   ALBUMIN 4.2 07/23/2017 0847   AST 21 04/16/2021 1143   ALT 11 04/16/2021 1143   ALKPHOS 91 04/16/2021 1143   BILITOT 0.5 04/16/2021 1143   BILITOT 0.4 07/23/2017 0847   GFRNONAA >60 08/21/2021 1338   GFRAA 76 12/07/2019 0839    Lab Results  Component Value Date   CHOL 134  07/23/2017   HDL 40 07/23/2017   LDLCALC 63 07/23/2017   TRIG 153 (H) 07/23/2017   CHOLHDL 3.4 07/23/2017   Lab Results  Component Value Date   HGBA1C 5.8 (H) 03/25/2016   No results found for: "VITAMINB12" Lab Results  Component Value Date   TSH 1.469 04/16/2021    03/05/16 Dg Sniff chest - Normal diaphragmatic function.  06/12/12 MRI lumbar [I reviewed images myself and agree with interpretation. -VRP]  - No distinct cause of left hip pain is identified. - Curvature convex to the right with the apex at L2-3. - Shallow central disc herniation at L3-4 that contacts the thecal sac but does not appear to cause neural compression.  Mild facet arthropathy at this level that could conceivably be symptomatic.  - L4-5:  Bulging of the disc.  Bilateral facet degeneration.  No apparent compressive stenosis.  The facet disease could possibly be symptomatic. - L5 S1:  Right foraminal to extraforaminal osteophyte and bulging disc could possibly irritate the right L5 nerve root.  Definite neural compression is not demonstrated however.  04/18/16 exercise testing - Exercise testing with gas exchange demonstrates normal functional capacity when compared to matched sedentary norms. There is no clear indication for cardiopulmonary limitation. At peak exercise and with flat O2 pulse pattern, patient is likely limited due to diastolic dysfunction as a result of her body habitus. Expect weight loss and regular exercise would greatly improve exercise intolerance.  04/23/16 MRI brain (without)  1.   The brain parenchyma appears normal for age 9.   There is an air fluid level within the left maxillary sinus consistent with acute sinusitis 3.   No acute findings.  03/25/16 LABS - CK, aldolase, AchR, TSH --> all normal  04/30/21 MRI brain 1.  No vestibular schwannoma or other etiology of sensorineural hearing loss identified.    2.  Nonspecific focus of  enhancement in the periventricular white matter adjacent to the atrium of the right lateral ventricle. This was not clearly present on remote prior imaging from 2009. This could represent a benign entity such as a vascular structure/lesion; however, other enhancing lesions such as a subacute infarct, demyelinating disease, or metastatic lesion are also possible. Further evaluation with dedicated MRI of the brain with and without contrast is recommended.   06/14/21 MRI brain Small enhancing T2/FLAIR hyperintense lesion within the superior right periatrial white matter without substantial mass effect, grossly similar to prior. No other enhancing lesions identified. Differential considerations include subacute infarct with metastatic lesion thought less likely given stability in the absence of a specific therapy. Recommend follow-up MRI with and without contrast in 2-3 months to evaluate evolution.   ASSESSMENT AND PLAN  71 y.o. year old female here with here with new onset dyspnea on exertion, shortness of breath, fatigue, since January 2018. No evidence of underlying neuromuscular disorder.  Also with history of essential tremor.  Dx: shortness of breath / fatigue (due to mild deconditioning and obesity) + ESSENTIAL TREMOR  1. Essential tremor      PLAN:  ESSENTIAL TREMOR - regarding tremor control, currently on propranolol 10mg  as needed for atrial fibrillation - consider to change gabapentin from 900mg  at bedtime to 600mg  twice a day  - may consider primidone as well - may consider deep brain stimulator in future  SYNCOPE (10/02/20, March 2023) - per cardiology and PCP  [facility] MRI BRAIN  - follow up scan (ordered by dr )  Return for pending if symptoms worsen or fail to  improve.    Suanne Marker, MD 08/28/2021, 2:06 PM Certified in Neurology, Neurophysiology and Neuroimaging  [facility] [address] [place], [place] [address] [phone]

## 2021-08-28 NOTE — Patient Instructions (Signed)
  ESSENTIAL TREMOR  - regarding tremor control, currently on propranolol 10mg  as needed for atrial fibrillation  - consider to change gabapentin from 900mg  at bedtime to 600mg  twice a day   - may consider primidone as well  - may consider deep brain stimulator in future

## 2021-08-29 DIAGNOSIS — H903 Sensorineural hearing loss, bilateral: Secondary | ICD-10-CM | POA: Diagnosis not present

## 2021-09-05 DIAGNOSIS — R9089 Other abnormal findings on diagnostic imaging of central nervous system: Secondary | ICD-10-CM | POA: Diagnosis not present

## 2021-09-07 DIAGNOSIS — H524 Presbyopia: Secondary | ICD-10-CM | POA: Diagnosis not present

## 2021-09-07 DIAGNOSIS — H2513 Age-related nuclear cataract, bilateral: Secondary | ICD-10-CM | POA: Diagnosis not present

## 2021-09-07 DIAGNOSIS — H35371 Puckering of macula, right eye: Secondary | ICD-10-CM | POA: Diagnosis not present

## 2021-09-07 DIAGNOSIS — H40023 Open angle with borderline findings, high risk, bilateral: Secondary | ICD-10-CM | POA: Diagnosis not present

## 2021-09-11 DIAGNOSIS — H25813 Combined forms of age-related cataract, bilateral: Secondary | ICD-10-CM | POA: Diagnosis not present

## 2021-09-11 DIAGNOSIS — H353131 Nonexudative age-related macular degeneration, bilateral, early dry stage: Secondary | ICD-10-CM | POA: Diagnosis not present

## 2021-09-11 DIAGNOSIS — H3122 Choroidal dystrophy (central areolar) (generalized) (peripapillary): Secondary | ICD-10-CM | POA: Diagnosis not present

## 2021-09-11 DIAGNOSIS — H35371 Puckering of macula, right eye: Secondary | ICD-10-CM | POA: Diagnosis not present

## 2021-09-11 DIAGNOSIS — H35453 Secondary pigmentary degeneration, bilateral: Secondary | ICD-10-CM | POA: Diagnosis not present

## 2021-09-11 DIAGNOSIS — H35363 Drusen (degenerative) of macula, bilateral: Secondary | ICD-10-CM | POA: Diagnosis not present

## 2021-09-17 ENCOUNTER — Ambulatory Visit: Payer: Medicare PPO | Admitting: Diagnostic Neuroimaging

## 2021-09-17 DIAGNOSIS — M461 Sacroiliitis, not elsewhere classified: Secondary | ICD-10-CM | POA: Diagnosis not present

## 2021-09-22 NOTE — Progress Notes (Signed)
Carelink Summary Report / Loop Recorder 

## 2021-09-24 LAB — CUP PACEART REMOTE DEVICE CHECK
Date Time Interrogation Session: 20230831183029
Implantable Pulse Generator Implant Date: 20230524

## 2021-09-25 ENCOUNTER — Ambulatory Visit: Payer: Medicare PPO | Admitting: Diagnostic Neuroimaging

## 2021-09-25 ENCOUNTER — Ambulatory Visit (INDEPENDENT_AMBULATORY_CARE_PROVIDER_SITE_OTHER): Payer: Medicare PPO

## 2021-09-25 DIAGNOSIS — I4819 Other persistent atrial fibrillation: Secondary | ICD-10-CM

## 2021-09-27 DIAGNOSIS — G43019 Migraine without aura, intractable, without status migrainosus: Secondary | ICD-10-CM | POA: Diagnosis not present

## 2021-10-15 NOTE — Progress Notes (Signed)
Carelink Summary Report / Loop Recorder 

## 2021-10-24 LAB — CUP PACEART REMOTE DEVICE CHECK
Date Time Interrogation Session: 20231003183044
Implantable Pulse Generator Implant Date: 20230524

## 2021-10-29 ENCOUNTER — Ambulatory Visit (INDEPENDENT_AMBULATORY_CARE_PROVIDER_SITE_OTHER): Payer: Medicare PPO

## 2021-10-29 DIAGNOSIS — R55 Syncope and collapse: Secondary | ICD-10-CM

## 2021-11-03 DIAGNOSIS — Z23 Encounter for immunization: Secondary | ICD-10-CM | POA: Diagnosis not present

## 2021-11-07 NOTE — Progress Notes (Signed)
Carelink Summary Report / Loop Recorder 

## 2021-11-12 ENCOUNTER — Ambulatory Visit: Payer: Medicare PPO | Admitting: Diagnostic Neuroimaging

## 2021-11-12 ENCOUNTER — Encounter: Payer: Self-pay | Admitting: Diagnostic Neuroimaging

## 2021-11-12 VITALS — BP 128/73 | HR 56 | Ht 69.5 in | Wt 198.5 lb

## 2021-11-12 DIAGNOSIS — G250 Essential tremor: Secondary | ICD-10-CM | POA: Diagnosis not present

## 2021-11-12 MED ORDER — GABAPENTIN 600 MG PO TABS: 600.0000 mg | ORAL_TABLET | Freq: Three times a day (TID) | ORAL | 6 refills | Status: AC

## 2021-11-12 NOTE — Progress Notes (Signed)
[facility]  PATIENT: [name] DOB: October 04, 1950  REFERRING CLINICIAN: Creola Corn, MD  HISTORY FROM: patient REASON FOR VISIT: follow up   HISTORICAL  CHIEF COMPLAINT:  Chief Complaint  Patient presents with   Follow-up    Pt states she feels like her tremors are getting worse. Room 6 with husband    HISTORY OF PRESENT ILLNESS:   UPDATE (11/12/21, VRP): Since last visit, tremor continue. Symptoms are slightly progressed. Severity is moderate. No alleviating or aggravating factors. Tolerating gabapentin 600mg  twice a day.    UPDATE (08/28/21, VRP): Since last visit, doing worse with tremor.  Symptoms are progressive. Had another syncope in March 2023. Now on propranolol as needed. More stress.  UPDATE (12/05/20, VRP): Since last visit, doing well except had syncope event in pre-op area for husband surgery, in 10/02/20. Had prodromal sweating, heart racing, energy draining sensations. Occurred while sitting down. Then passed out and was taken to ER. Left before being seen officially, after waiting 13 hours.   UPDATE (05/19/20, VRP): Since last visit, tremor continues and now patient wants to try meds. Has propranolol as needed (rarely uses currently) for atrial fibrillation. Tremor worse with fatigue.  UPDATE 07/29/16: Since last visit, doing a little better. Now working in garden and around the home, and feeling better. Walking short distances at home (5-10 minutes). Tremor stable.   PRIOR HPI (03/25/16): 71 year old female here for evaluation of shortness of breath and dyspnea on exertion. Since January 2018 patient has had intermittent problems with shortness of breath and dyspnea on exertion, and has had thorough evaluation by pulmonary and cardiology physicians, without specific cause found. She does have history of atrial fibrillation, atrial flutter, congestive heart failure, but these do not appear to be explain patient's dyspnea symptoms adequately, and  therefore possibility of an occult neuromuscular disease has been raised and therefore patient presenting to me for further evaluation. Patient requested for me to see her for this problem due to her meeting me while I was treating one of her relatives.  Patient reports a variety of constitutional symptoms include chest pain, ringing in ears spinning sensation shortness of breath snoring tremor. Patient can feel shortness of breath when she exerts herself as well as at rest. Sometimes she thinks about a problem her symptoms can get worse. She denies any muscle twitching, muscle weakness focally.   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 Multiple family members on her father's side including father, sister, nephew and others have history of tremor. Some of them have tremor in chin, head and some in the hands. Patient has been tried on primidone in the past without relief and some aggravation of symptoms.    REVIEW OF SYSTEMS: Full 14 system review of systems performed and negative with exception of: hearing loss.   ALLERGIES: Allergies  Allergen Reactions   Oxycodone Hives    Hives    Topamax [Topiramate] Other (See Comments)    hallucinations    Codeine Nausea And Vomiting    VERTIGO   Penicillins Rash   Tramadol     dizziness    HOME MEDICATIONS: Outpatient Medications Prior to Visit  Medication Sig Dispense Refill   albuterol (VENTOLIN HFA) 108 (90 Base) MCG/ACT inhaler Inhale 2 puffs into the lungs every 6 (six) hours as needed for wheezing or shortness of breath. 18 g 12   atorvastatin (LIPITOR) 20 MG tablet TAKE 1 TABLET BY MOUTH DAILY  AT 6PM 90 tablet 1   calcium-vitamin D (OSCAL WITH D) 500-200 MG-UNIT tablet Take 1 tablet by mouth. 1063mcg     dofetilide (TIKOSYN) 125 MCG capsule Take 3 capsules (375 mcg total) by mouth 2 (two) times daily. 540 capsule 3   ezetimibe (ZETIA) 10 MG tablet TAKE 1 TABLET EVERY DAY 90  tablet 3   gabapentin (NEURONTIN) 600 MG tablet Take 900 mg by mouth at bedtime.     Multiple Vitamins-Minerals (PRESERVISION AREDS 2 PO) Take 1 capsule by mouth 2 (two) times daily.     Polyethyl Glycol-Propyl Glycol (SYSTANE OP) Apply 1 drop to eye daily as needed (dry eyes).     propranolol (INDERAL) 10 MG tablet Take 1 tablet (10 mg total) by mouth every 6 (six) hours as needed (for palpitations).     spironolactone (ALDACTONE) 25 MG tablet TAKE 1 TABLET EVERY DAY 90 tablet 2   XARELTO 20 MG TABS tablet TAKE 1 TABLET BY MOUTH DAILY WITH SUPPER 90 tablet 1   No facility-administered medications prior to visit.    PAST MEDICAL HISTORY: Past Medical History:  Diagnosis Date   Atrial fibrillation (HCC)    Back pain    nerve problems-had series of three injections   Bone spur    Spine   Chronic anticoagulation    Diastolic congestive heart failure (HCC)    Hearing loss    History of shingles    Hyperlipemia    Migraine headache    Obstructive sleep apnea    uses CPAP   OSA on CPAP    Paroxysmal atrial fibrillation (HCC)    Restrictive cardiomyopathy (HCC)    Tremor    Tubal pregnancy     PAST SURGICAL HISTORY: Past Surgical History:  Procedure Laterality Date   BREAST EXCISIONAL BIOPSY Left    benign   CARDIAC CATHETERIZATION     CARDIOVERSION     CHOLECYSTECTOMY     ECTOPIC PREGNANCY SURGERY  1980   EYE SURGERY     left eye injections x2-was having "stroke like" flashes of light   heart biopsy  2008   [facility]   HYSTEROSCOPY  06/2003   and D&C-simple hyperplasia secondary PMP bleeding   implantable loop recorder placement  06/13/2021   Medtronic Reveal Linq model LNQ 22 (SN [id number]) implantable loop recorder implanted for syncope and AF management   ROTATOR CUFF REPAIR     bilateral    FAMILY HISTORY: Family History  Problem Relation Age of Onset   Hypertension Father    Dementia Father    Seizures Father    Atrial fibrillation Mother    Cancer Maternal  Grandmother        mouth   Cancer Maternal Uncle        unknown type   Thyroid cancer Sister    Breast cancer Sister        breast cancer   Heart disease Sister    Atrial fibrillation Sister    Dementia Sister     SOCIAL HISTORY:  Social History   Socioeconomic History   Marital status: Married    Spouse name: [name]   Number of children: 0   Years of education: college   Highest education level: Not on file  Occupational History    Comment: retired  Tobacco Use   Smoking status: Never   Smokeless tobacco: Never  Vaping Use   Vaping Use: Never used  Substance and Sexual Activity   Alcohol use: Not Currently   Drug  use: No   Sexual activity: Not Currently    Birth control/protection: Post-menopausal  Other Topics Concern   Not on file  Social History Narrative   Patient lives in [place] with her husband [name])  and she is retired. College education.   Caffeine- 2-3 cups tea daily .   Social Determinants of Health   Financial Resource Strain: Not on file  Food Insecurity: Not on file  Transportation Needs: Not on file  Physical Activity: Not on file  Stress: Not on file  Social Connections: Not on file  Intimate Partner Violence: Not on file     PHYSICAL EXAM  GENERAL EXAM/CONSTITUTIONAL: Vitals:  Vitals:   11/12/21 1400  BP: 128/73  Pulse: (!) 56  Weight: 198 lb 8 oz (90 kg)  Height: 5' 9.5" (1.765 m)   Wt Readings from Last 3 Encounters:  11/12/21 198 lb 8 oz (90 kg)  08/28/21 198 lb (89.8 kg)  08/21/21 199 lb (90.3 kg)   Body mass index is 28.89 kg/m. No results found. Patient is in no distress; well developed, nourished and groomed; neck is supple  CARDIOVASCULAR: Examination of carotid arteries is normal; no carotid bruits Regular rate and rhythm, no murmurs Examination of peripheral vascular system by observation and palpation is normal  EYES: Ophthalmoscopic exam of optic discs and posterior segments is normal; no papilledema or  hemorrhages  MUSCULOSKELETAL: Gait, strength, tone, movements noted in Neurologic exam below  NEUROLOGIC: MENTAL STATUS:      No data to display         awake, alert, oriented to person, place and time recent and remote memory intact normal attention and concentration language fluent, comprehension intact, naming intact,  fund of knowledge appropriate  CRANIAL NERVE:  2nd - no papilledema on fundoscopic exam 2nd, 3rd, 4th, 6th - pupils equal and reactive to light, visual fields full to confrontation, extraocular muscles intact, no nystagmus 5th - facial sensation symmetric 7th - facial strength symmetric 8th - hearing intact 9th - palate elevates symmetrically, uvula midline 11th - shoulder shrug symmetric 12th - tongue protrusion midline MILD HEAD TREMOR  MOTOR:  normal bulk and tone, full strength in the BUE, BLE MODERATE POSTURAL TREMOR IN BUE NO BRADYKINESIA  SENSORY:  normal and symmetric to light touch, temperature, vibration  COORDINATION:  finger-nose-finger, fine finger movements --> ACTION TREMOR  REFLEXES:  deep tendon reflexes present and symmetric TRACE AT ANKLES  GAIT/STATION:  narrow based gait    DIAGNOSTIC DATA (LABS, IMAGING, TESTING) - I reviewed patient records, labs, notes, testing and imaging myself where available.  Lab Results  Component Value Date   WBC 7.2 04/17/2021   HGB 12.6 04/17/2021   HCT 40.0 04/17/2021   MCV 90.9 04/17/2021   PLT 209 04/17/2021      Component Value Date/Time   NA 141 08/21/2021 1338   NA 141 10/10/2020 1130   K 4.4 08/21/2021 1338   CL 107 08/21/2021 1338   CO2 29 08/21/2021 1338   GLUCOSE 100 (H) 08/21/2021 1338   BUN 14 08/21/2021 1338   BUN 16 10/10/2020 1130   CREATININE 0.90 08/21/2021 1338   CREATININE 0.95 01/01/2016 0839   CALCIUM 9.4 08/21/2021 1338   PROT 5.9 (L) 04/16/2021 1143   PROT 7.0 07/23/2017 0847   ALBUMIN 3.2 (L) 04/16/2021 1143   ALBUMIN 4.2 07/23/2017 0847   AST 21  04/16/2021 1143   ALT 11 04/16/2021 1143   ALKPHOS 91 04/16/2021 1143   BILITOT 0.5 04/16/2021 1143  BILITOT 0.4 07/23/2017 0847   GFRNONAA >60 08/21/2021 1338   GFRAA 76 12/07/2019 0839   Lab Results  Component Value Date   CHOL 134 07/23/2017   HDL 40 07/23/2017   LDLCALC 63 07/23/2017   TRIG 153 (H) 07/23/2017   CHOLHDL 3.4 07/23/2017   Lab Results  Component Value Date   HGBA1C 5.8 (H) 03/25/2016   No results found for: "VITAMINB12" Lab Results  Component Value Date   TSH 1.469 04/16/2021    03/05/16 Dg Sniff chest - Normal diaphragmatic function.  06/12/12 MRI lumbar [I reviewed images myself and agree with interpretation. -VRP]  - No distinct cause of left hip pain is identified. - Curvature convex to the right with the apex at L2-3. - Shallow central disc herniation at L3-4 that contacts the thecal sac but does not appear to cause neural compression.  Mild facet arthropathy at this level that could conceivably be symptomatic.  - L4-5:  Bulging of the disc.  Bilateral facet degeneration.  No apparent compressive stenosis.  The facet disease could possibly be symptomatic. - L5 S1:  Right foraminal to extraforaminal osteophyte and bulging disc could possibly irritate the right L5 nerve root.  Definite neural compression is not demonstrated however.  04/18/16 exercise testing - Exercise testing with gas exchange demonstrates normal functional capacity when compared to matched sedentary norms. There is no clear indication for cardiopulmonary limitation. At peak exercise and with flat O2 pulse pattern, patient is likely limited due to diastolic dysfunction as a result of her body habitus. Expect weight loss and regular exercise would greatly improve exercise intolerance.  04/23/16 MRI brain (without)  1.   The brain parenchyma appears normal for age 87.   There is an air fluid level within the left maxillary sinus consistent with acute sinusitis 3.   No acute findings.  03/25/16  LABS - CK, aldolase, AchR, TSH --> all normal  04/30/21 MRI brain 1.  No vestibular schwannoma or other etiology of sensorineural hearing loss identified.    2.  Nonspecific focus of enhancement in the periventricular white matter adjacent to the atrium of the right lateral ventricle. This was not clearly present on remote prior imaging from 2009. This could represent a benign entity such as a vascular structure/lesion; however, other enhancing lesions such as a subacute infarct, demyelinating disease, or metastatic lesion are also possible. Further evaluation with dedicated MRI of the brain with and without contrast is recommended.   06/14/21 MRI brain Small enhancing T2/FLAIR hyperintense lesion within the superior right periatrial white matter without substantial mass effect, grossly similar to prior. No other enhancing lesions identified. Differential considerations include subacute infarct with metastatic lesion thought less likely given stability in the absence of a specific therapy. Recommend follow-up MRI with and without contrast in 2-3 months to evaluate evolution.   ASSESSMENT AND PLAN  71 y.o. year old female here with here with new onset dyspnea on exertion, shortness of breath, fatigue, since January 2018. No evidence of underlying neuromuscular disorder.  Also with history of essential tremor.  Dx: shortness of breath / fatigue (due to mild deconditioning and obesity) + ESSENTIAL TREMOR  1. Essential tremor      PLAN:  ESSENTIAL TREMOR - regarding tremor control, currently on propranolol 10mg  as needed for atrial fibrillation - increase gabapentin to 600mg  three times a day - may consider primidone as well (but interaction with xarelto) - may consider deep brain stimulator in future  SYNCOPE (10/02/20, March 2023) - per  cardiology and PCP  [facility] MRI BRAIN  - follow up scan (ordered by dr Timothy Lasso)  Meds ordered this encounter  Medications   gabapentin (NEURONTIN) 600 MG  tablet    Sig: Take 1 tablet (600 mg total) by mouth 3 (three) times daily.    Dispense:  90 tablet    Refill:  6   Return in about 1 year (around 11/13/2022).    Suanne Marker, MD 11/12/2021, 3:26 PM Certified in Neurology, Neurophysiology and Neuroimaging  [facility] [address] [place], [place] [address] [phone]

## 2021-11-12 NOTE — Patient Instructions (Signed)
ESSENTIAL TREMOR - regarding tremor control, currently on propranolol 10mg  as needed for atrial fibrillation - increase gabapentin to 600mg  three times a day - may consider primidone as well - may consider deep brain stimulator in future

## 2021-11-23 ENCOUNTER — Other Ambulatory Visit: Payer: Self-pay | Admitting: Cardiovascular Disease

## 2021-11-23 DIAGNOSIS — I4819 Other persistent atrial fibrillation: Secondary | ICD-10-CM

## 2021-11-23 NOTE — Telephone Encounter (Signed)
Prescription refill request for Xarelto received.  Indication: Afib  Last office visit: 08/21/21 ([name])  Weight: 90kg Age: 71 Scr: 0.90 (08/21/21)  CrCl: 81.65ml/min  Appropriate dose and refill sent to requested pharmacy

## 2021-12-03 ENCOUNTER — Ambulatory Visit (INDEPENDENT_AMBULATORY_CARE_PROVIDER_SITE_OTHER): Payer: Medicare PPO

## 2021-12-03 DIAGNOSIS — R55 Syncope and collapse: Secondary | ICD-10-CM

## 2021-12-03 LAB — CUP PACEART REMOTE DEVICE CHECK
Date Time Interrogation Session: 20231112230112
Implantable Pulse Generator Implant Date: 20230524

## 2021-12-19 DIAGNOSIS — M461 Sacroiliitis, not elsewhere classified: Secondary | ICD-10-CM | POA: Diagnosis not present

## 2022-01-06 NOTE — Progress Notes (Signed)
could go okay  Cardiology Office Note   Date:  01/07/2022   ID:  [name], DOB 07/20/1950, MRN [id number]  PCP:  Creola Corn, MD  Cardiologist:   Kristeen Miss, MD   Chief Complaint  Patient presents with   Atrial Fibrillation        1. Atrial fibrillation 2. Hyperlipidemia 3. Diastolic congestive heart failure  71 year old female with a history of atrial fibrillation. She's been well controlled on Tikosyn. She also has a history of hyperlipidemia and is on Lipitor. She's been on chronic Pradaxa therapy for anticoagulation.  February 20, 2012 [name]  has done well since I last saw her. 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 She has occasional palpitations but overall she's doing quite well.  She is exercising ( water aerobics) twice a week.  Feb. 24, 2015:  [name] is doing well.   No problems.  Maintaining NSR.  Sept. 16, 2015:  She was in the ER this past week.  CP, dyspnea .. CT angio was negative for PE - showed ? Of pneumonia.   Was treated wit Abx. Does well in the am's .[name] Fatigued and perhaps more short of breath in the afternoons.   April 05, 2014: [name] is a 71 y.o. female who presents for follow-up of her atrial fibrillation. She has continued to have some DOE,  Is concerned about the atorvastatin. Was seen in the lipid clinic.    Sept. 30, 2016:  Doing well.  No CP , no dyspnea. Has lost 11 lbs since last year.  Is in NSR  April 10, 2015:  [name] is doing ok from a cardiology standpoint. Has had some dental issues   Sept.  22, 2017:  [name] is seen for follow up of her atrial fib.    No  CP or dyspnea. No further episodes of atrial fib.   Dec. 11, 2017:  [name] is seen today for followup for her atrial fib  Has some DOE  Not exercising as much as she needs to  No CP   Jan. 16, 2018: [name] is seen today as a work in visit at the request of Dr. Timothy Lasso. Been having more shortness of breath and  was thought to have worsening congestive heart failure.  Has severe DOE with any exertion. We performed a cardiac cath in 2008 which revealed normal coronaries.   She went to [facility] and had a heart biopsy.   Was not told of any abnormality.  Does have dull chest pain with exertion and with sitting   She had an echocardiogram performed yesterday which revealed normal left ventricular systolic function. She does have grade 2 diastolic dysfunction. Her Lasix was increased last week.   She has not noticed any increase in urine output.  No change in her breathing .  Has noticed that she loses her breath if she does any exertion .  Cannot lay supine - has severe dyspnea.   April 02, 2016:  [name] is seen today for follow up visit .    Seen with husband , [name]  She had an episode of PAF yesterday  - lasted for several hours.   Is back in NSR currently .  Had an episode in Feb. That lasted 2-3 hours , HR of 129 ,  She was very short of breath with that.   -Sleep study on Feb. 20 shows mild OSA -PFTs 01/2016 showed ratio 72, FVC 74%, FEV1 69%, TLC 91%  and DLCO 73% suggesting moderate intraparenchymal restriction  - echo shows normal LV systolic function with grade 2 diastolic dysfunction. She has an estimated PA pressure of 35.  Has developed a tremor.   Is seeing a neurologist .   July 10, 2016:  Had a CPX in March , Normal functional capacity compared to sedentary patients.  Still has some vocal issues ( when she gets fatigued, she loses her voice) struggles to talk  Able to get out and work in the garden Using CPAP - may have helped some   Dec. 19, 2018:  [name] is seen today for follow up of her chronic diastolic CHF [name] was [facility] earlier this year.  She had a heart catheterization which revealed normal coronary arteries.  She has had a myocardial biopsy which was reportedly normal. History of paroxysmal atrial fibrillation which seems to be  well-controlled on  Tikosyn  PFTs has shown reduction of DLCO ( 73% pred.)   Is just getting over al cold  Has continued to have issues with vertigo  Not getting out to do any exercise   July 23, 2017:  Doing well,  No CP or dyspnea. Gets short of breath with the heat.  Started getting some exercise, yoga .  No cardiac exercise  Is having some joint issues,     February 02, 2018: He is seen today for follow-up visit.  She has a history of atrial fibrillation. She remains in normal sinus rhythm on Tikosyn.   She had an episode of paroxysmal atrial fibrillation this past Thursday.  The episode lasted for about 3 hours.   She took 1 of [name]'s propranolol tablets and went back into sinus rhythm fairly quickly.  Her QT intervals have remained good.  Sept. 15, 2020   [name] is seen today . caregiving for [name]'s sister.  Wt. Today is  222 ( previous wt from Jan. 2020 was 241 lbs)   Walking some , eating fewer carbs.   April 13, 2019  [name] is seen today for follow up of her atrial fib and chronic diastilic CHF Wt is 225 lbs.  ( up 3 lbs )  Has chronic diastolic CHF Had a spinal injection last week..   Still has back and leg pain with walking  Has not been able to exercise.     Nov. 16, 2021 [name] is seen today for follow up of her chronic CHF, PAF  Wt today is 225 lbs.   Had an episode of PAF several weeks ago.   Lasted 2 days HR was 130s  Made her fatigued.  Has been walking ,  Had lost some weight, now has gained it back  Her Afib started while she was walking up her driveway  Is on xarelto   August 03, 2020: [name] is seen today for follow-up of her chronic congestive heart failure, paroxysmal atrial fibrillation.  Her weight today is211 lbs.   She was seen by [name], NP for worsening shortness of breath.  She has been on Tikosyn for her atrial fibrillation. Her Lasix was increased to 80 mg a day for 3 days then she resumed 40 mg p.o. twice daily.  She was last  seen in A. fib clinic in March, 2022.  She had been maintaining sinus rhythm at that time.  Is not able to do much due to severe DOE . Loses her breath with any activity .  Does an activity , then has to sit  down Does another activity , then has to sit down  Walks the dog for a few minutes - perhaps 30 mintues.  Had CPX in 2018 ;-   Normal coronary CTA in Feb. 2020 Coronary calcium score is 0   She had a cardiopulmonary stress test in 2018.  It revealed normal functional capacity.  She also had pulmonary function test around 2018.  It revealed mildly reduced diffusion capacity  Voice is normal when she is rested,  has a very difficult time speaking when she is fatigued.  Normal TSH in June, 2022  Echo Jan. 2022:   EF 60-65%.,  Grade 2 diastolic dysfunction. Mild mitral regurgitation.  Saw [name] , NP on last June.  Trial of acid blockers to see if that would help with her hoarse voice No benefit according to [facility]  April 02, 2021 [name] is seen today for follow up of her chronic diastolic CHF Has mild MR She has a hx of episodic dyspnea and loses her voice regularly  I have encouraged her to discuss this with Dr Timothy Lasso and a neurologist  Had a sleep study.  Now has a different CPAP mask ( sees Dr. Vassie Loll )  [facility] is 201 lbs .   April 24, 2021 [name] is seen today for follow up of her chronic diastolic CHF, mild MR She was admitted to the hospital on April 16, 2021 with syncope - occurred at 10 AM .   She had developed atrial fib 2 days prior to her admission.   She had taken 2 propranolol prior to passing out  The syncope was likely orthostatic hypotension by hx . She had just been to the bathroom, stood up , took several steps  She had eaten breakfast, coffee, juice.  Takes propranolol 10 mg BID ( breakfast and dinner ) for her tremor.   Has had 35 lb weight loss She was out for 45-60 seconds Was in atrial fib.    Was scheduled for TEE /cv but she converted on her  own  She is scheduled to see [place] for consideration of implantable loop recorder   Wt is 200 lbs .  She has lost 35 lbs unintentionally . She has discussed with dr. Timothy Lasso.  Memory is not very good Having some vertigo issues,  hearing has been an issue  Has seen ENT .  Has a brain MRI in several weeks .   She is bradycardic today.  My plan is to change her propranolol to just as needed instead of 10 mg twice a day.  Her blood pressure is also on the lower end of normal and she is having orthostatic hypotension.  We will discontinue Lasix and potassium.  We will continue spironolactone.  Jan 07, 2022  [name] is seen today for follow up of her atrial , chronici diastolic CHF,  syncope   Generalized weakness and shortness of breath   Has had 2 episodes of PAF  Lasting from 1 hour to several hours  HR was not as high as previous episodes  Is associated with more dyspnea.  And more fatigue   Lipid levels from Dr. Ferd Hibbs office were reviewed   Jan. 27, 2023 Chol = 135 HDL = 54 Trigs = 61 LDL = 68     Wt Readings from Last 3 Encounters:  01/07/22 195 lb 12.8 oz (88.8 kg)  11/12/21 198 lb 8 oz (90 kg)  08/28/21 198 lb (89.8 kg)      Past Medical History:  Diagnosis Date   Atrial fibrillation (HCC)    Back pain    nerve problems-had series of three injections   Bone spur    Spine   Chronic anticoagulation    Diastolic congestive heart failure (HCC)    Hearing loss    History of shingles    Hyperlipemia    Migraine headache    Obstructive sleep apnea    uses CPAP   OSA on CPAP    Paroxysmal atrial fibrillation (HCC)    Restrictive cardiomyopathy (HCC)    Tremor    Tubal pregnancy     Past Surgical History:  Procedure Laterality Date   BREAST EXCISIONAL BIOPSY Left    benign   CARDIAC CATHETERIZATION     CARDIOVERSION     CHOLECYSTECTOMY     ECTOPIC PREGNANCY SURGERY  1980   EYE SURGERY     left eye injections x2-was having "stroke like" flashes of  light   heart biopsy  2008   [facility]   HYSTEROSCOPY  06/2003   and D&C-simple hyperplasia secondary PMP bleeding   implantable loop recorder placement  06/13/2021   Medtronic Reveal Linq model LNQ 22 (SN [id number]) implantable loop recorder implanted for syncope and AF management   ROTATOR CUFF REPAIR     bilateral     Current Outpatient Medications  Medication Sig Dispense Refill   albuterol (VENTOLIN HFA) 108 (90 Base) MCG/ACT inhaler Inhale 2 puffs into the lungs every 6 (six) hours as needed for wheezing or shortness of breath. 18 g 12   atorvastatin (LIPITOR) 20 MG tablet TAKE 1 TABLET BY MOUTH DAILY AT 6PM 90 tablet 2   calcium-vitamin D (OSCAL WITH D) 500-200 MG-UNIT tablet Take 1 tablet by mouth.     dofetilide (TIKOSYN) 125 MCG capsule Take 3 capsules (375 mcg total) by mouth 2 (two) times daily. 540 capsule 3   ezetimibe (ZETIA) 10 MG tablet TAKE 1 TABLET EVERY DAY 90 tablet 3   gabapentin (NEURONTIN) 600 MG tablet Take 1 tablet (600 mg total) by mouth 3 (three) times daily. 90 tablet 6   Multiple Vitamins-Minerals (PRESERVISION AREDS 2 PO) Take 1 capsule by mouth 2 (two) times daily.     Polyethyl Glycol-Propyl Glycol (SYSTANE OP) Apply 1 drop to eye daily as needed (dry eyes).     propranolol (INDERAL) 10 MG tablet Take 1 tablet (10 mg total) by mouth every 6 (six) hours as needed (for palpitations).     rivaroxaban (XARELTO) 20 MG TABS tablet TAKE 1 TABLET BY MOUTH DAILY WITH SUPPER 90 tablet 3   spironolactone (ALDACTONE) 25 MG tablet TAKE 1 TABLET EVERY DAY 90 tablet 2   No current facility-administered medications for this visit.    Allergies:   Oxycodone, Topamax [topiramate], Codeine, Penicillins, and Tramadol    Social History:  The patient  reports that she has never smoked. She has never used smokeless tobacco. She reports that she does not currently use alcohol. She reports that she does not use drugs.   Family History:  [name] family history  includes Atrial fibrillation in her mother and sister; Breast cancer in her sister; Cancer in her maternal grandmother and maternal uncle; Dementia in her father and sister; Heart disease in her sister; Hypertension in her father; Seizures in her father; Thyroid cancer in her sister.    ROS:   As noted in current hx. Otherwise negative     Physical Exam: Blood pressure (!) 128/58, pulse 60, height 5' 9.5" (1.765  m), weight 195 lb 12.8 oz (88.8 kg), last menstrual period 01/21/1990, SpO2 99 %.      GEN:  moderately obese female,   in no acute distress HEENT: Normal NECK: No JVD; No carotid bruits LYMPHATICS: No lymphadenopathy CARDIAC: RRR , soft systolic murmur  at LSB , does not radiate to L ax line  RESPIRATORY:  Clear to auscultation without rales, wheezing or rhonchi  ABDOMEN: Soft, non-tender, non-distended MUSCULOSKELETAL:  No edema; No deformity  SKIN: Warm and dry NEUROLOGIC:  Alert and oriented x 3    EKG: 08/21/21 :   Sinus brady at 53.    No ST or T wave changes.  Normal QTC     Recent Labs: 04/16/2021: ALT 11; TSH 1.469 04/17/2021: Hemoglobin 12.6; Platelets 209 08/21/2021: BUN 14; Creatinine, Ser 0.90; Magnesium 2.1; Potassium 4.4; Sodium 141    Lipid Panel    Component Value Date/Time   CHOL 134 07/23/2017 0847   TRIG 153 (H) 07/23/2017 0847   HDL 40 07/23/2017 0847   CHOLHDL 3.4 07/23/2017 0847   CHOLHDL 4 10/22/2013 1501   VLDL 32.6 10/22/2013 1501   LDLCALC 63 07/23/2017 0847      Wt Readings from Last 3 Encounters:  01/07/22 195 lb 12.8 oz (88.8 kg)  11/12/21 198 lb 8 oz (90 kg)  08/28/21 198 lb (89.8 kg)      Other studies Reviewed: Additional studies/ records that were reviewed today include: . Review of the above records demonstrates:    ASSESSMENT AND PLAN:  1. Atrial fibrillation -  will refer to EP for consideration of ablation . Previously saw Dr. Johney Frame.   Will have her see one of our other doctors who do Afib ablation .   OK to  refill meds     2. Syncope:       3.  Chest discomfort:  :      4. Diastolic congestive heart failure -   seems to be stable    5.  Mild TR :   stable,  has a soft syst murmur at LSB   6.  Shortness of breath:   worse when she is in atrial fib      Current medicines are reviewed at length with the patient today.  The patient does not have concerns regarding medicines.  The following changes have been made:  See above.   Labs/ tests ordered today include:   Orders Placed This Encounter  Procedures   Basic metabolic panel   Magnesium   Ambulatory referral to Cardiac Electrophysiology     Disposition:       Kristeen Miss, MD  01/07/2022 8:33 AM    [facility] [address] [place], [place], [place]  [address] Phone: [phone]; Fax: [phone]

## 2022-01-07 ENCOUNTER — Encounter: Payer: Self-pay | Admitting: Cardiovascular Disease

## 2022-01-07 ENCOUNTER — Ambulatory Visit (INDEPENDENT_AMBULATORY_CARE_PROVIDER_SITE_OTHER): Payer: Medicare PPO

## 2022-01-07 ENCOUNTER — Ambulatory Visit: Payer: Medicare PPO | Attending: Cardiovascular Disease | Admitting: Cardiovascular Disease

## 2022-01-07 VITALS — BP 128/58 | HR 60 | Ht 69.5 in | Wt 195.8 lb

## 2022-01-07 DIAGNOSIS — I4819 Other persistent atrial fibrillation: Secondary | ICD-10-CM

## 2022-01-07 DIAGNOSIS — R55 Syncope and collapse: Secondary | ICD-10-CM

## 2022-01-07 DIAGNOSIS — E785 Hyperlipidemia, unspecified: Secondary | ICD-10-CM

## 2022-01-07 LAB — BASIC METABOLIC PANEL
BUN/Creatinine Ratio: 26 (ref 12–28)
BUN: 23 mg/dL (ref 8–27)
CO2: 24 mmol/L (ref 20–29)
Calcium: 9.4 mg/dL (ref 8.7–10.3)
Chloride: 107 mmol/L — ABNORMAL HIGH (ref 96–106)
Creatinine, Ser: 0.87 mg/dL (ref 0.57–1.00)
Glucose: 99 mg/dL (ref 70–99)
Potassium: 4.1 mmol/L (ref 3.5–5.2)
Sodium: 145 mmol/L — ABNORMAL HIGH (ref 134–144)
eGFR: 71 mL/min/{1.73_m2} (ref >59)

## 2022-01-07 LAB — MAGNESIUM: Magnesium: 2 mg/dL (ref 1.6–2.3)

## 2022-01-07 NOTE — Patient Instructions (Signed)
Medication Instructions:  Your physician recommends that you continue on your current medications as directed. Please refer to the Current Medication list given to you today.  *If you need a refill on your cardiac medications before your next appointment, please call your pharmacy*   Lab Work: BMET, Mag level today If you have labs (blood work) drawn today and your tests are completely normal, you will receive your results only by: MyChart Message (if you have MyChart) OR A paper copy in the mail If you have any lab test that is abnormal or we need to change your treatment, we will call you to review the results.   Testing/Procedures: EP referral for ablation consideration   Follow-Up: At [facility], you and your health needs are our priority.  As part of our continuing mission to provide you with exceptional heart care, we have created designated Provider Care Teams.  These Care Teams include your primary Cardiologist (physician) and Advanced Practice Providers (APPs -  Physician Assistants and Nurse Practitioners) who all work together to provide you with the care you need, when you need it.  We recommend signing up for the patient portal called "MyChart".  Sign up information is provided on this After Visit Summary.  MyChart is used to connect with patients for Virtual Visits (Telemedicine).  Patients are able to view lab/test results, encounter notes, upcoming appointments, etc.  Non-urgent messages can be sent to your provider as well.   To learn more about what you can do with MyChart, go to [email or web address].    Your next appointment:   6 month(s)  The format for your next appointment:   In Person  Provider:   Kristeen Miss, MD       Important Information About Sugar

## 2022-01-07 NOTE — Progress Notes (Signed)
Carelink Summary Report / Loop Recorder 

## 2022-01-08 LAB — CUP PACEART REMOTE DEVICE CHECK
Date Time Interrogation Session: 20231217230413
Implantable Pulse Generator Implant Date: 20230524

## 2022-01-29 ENCOUNTER — Ambulatory Visit: Payer: Medicare PPO | Admitting: Diagnostic Neuroimaging

## 2022-01-31 ENCOUNTER — Ambulatory Visit: Payer: Medicare PPO | Attending: Cardiovascular Disease | Admitting: Cardiovascular Disease

## 2022-01-31 ENCOUNTER — Encounter: Payer: Self-pay | Admitting: Cardiovascular Disease

## 2022-01-31 VITALS — BP 108/68 | HR 55 | Ht 69.5 in | Wt 200.0 lb

## 2022-01-31 DIAGNOSIS — Z01812 Encounter for preprocedural laboratory examination: Secondary | ICD-10-CM | POA: Diagnosis not present

## 2022-01-31 DIAGNOSIS — G4733 Obstructive sleep apnea (adult) (pediatric): Secondary | ICD-10-CM | POA: Diagnosis not present

## 2022-01-31 DIAGNOSIS — I483 Typical atrial flutter: Secondary | ICD-10-CM

## 2022-01-31 DIAGNOSIS — I480 Paroxysmal atrial fibrillation: Secondary | ICD-10-CM

## 2022-01-31 NOTE — Progress Notes (Signed)
[name]:    Date:  01/31/2022   ID:  HAIZLEE [name], DOB 29-Jun-1950, MRN [id number]  PCP:  [name], [facility] Cardiologist:  Mertie Moores, MD Electrophysiologist:  Melida Quitter, MD     Referring MD: Acie Fredrickson [name] Cheng, MD   History of Present Illness:    [name] is a 72 y.o. female with a hx listed below, significant for paroxysmal atrial fibrillation, referred for arrhythmia management.  She has a history of atrial fibrillation managed with Tikosyn. She had breakthrough episodes of AF and flutter.  Linq interrogation today shows a 2% arrhythmia burden, the majority of which appears to be an atrial flutter.   She is very symptomatic with AF - very fatigued, short of breath.  Past Medical History:  Diagnosis Date   Atrial fibrillation (HCC)    Back pain    nerve problems-had series of three injections   Bone spur    Spine   Chronic anticoagulation    Diastolic congestive heart failure (HCC)    Hearing loss    History of shingles    Hyperlipemia    Migraine headache    Obstructive sleep apnea    uses CPAP   OSA on CPAP    Paroxysmal atrial fibrillation (HCC)    Restrictive cardiomyopathy ([place])    Tremor    Tubal pregnancy     Past Surgical History:  Procedure Laterality Date   BREAST EXCISIONAL BIOPSY Left    benign   CARDIAC CATHETERIZATION     CARDIOVERSION     CHOLECYSTECTOMY     ECTOPIC PREGNANCY SURGERY  1980   EYE SURGERY     left eye injections x2-was having "stroke like" flashes of light   heart biopsy  2008   [facility]   HYSTEROSCOPY  06/2003   and D&C-simple hyperplasia secondary PMP bleeding   implantable loop recorder placement  06/13/2021   Medtronic Reveal Linq model LNQ 22 (SN [id number]) implantable loop recorder implanted for syncope and AF management   ROTATOR CUFF REPAIR     bilateral    Current Medications: Current Meds  Medication Sig   albuterol (VENTOLIN HFA)  108 (90 Base) MCG/ACT inhaler Inhale 2 puffs into the lungs every 6 (six) hours as needed for wheezing or shortness of breath.   atorvastatin (LIPITOR) 20 MG tablet TAKE 1 TABLET BY MOUTH DAILY AT 6PM   calcium-vitamin D (OSCAL WITH D) 500-200 MG-UNIT tablet Take 1 tablet by mouth. 1063mcg   dofetilide (TIKOSYN) 125 MCG capsule Take 3 capsules (375 mcg total) by mouth 2 (two) times daily.   ezetimibe (ZETIA) 10 MG tablet TAKE 1 TABLET EVERY DAY   gabapentin (NEURONTIN) 600 MG tablet Take 1 tablet (600 mg total) by mouth 3 (three) times daily.   Multiple Vitamins-Minerals (PRESERVISION AREDS 2 PO) Take 1 capsule by mouth 2 (two) times daily.   Polyethyl Glycol-Propyl Glycol (SYSTANE OP) Apply 1 drop to eye daily as needed (dry eyes).   propranolol (INDERAL) 10 MG tablet Take 1 tablet (10 mg total) by mouth every 6 (six) hours as needed (for palpitations).   rivaroxaban (XARELTO) 20 MG TABS tablet TAKE 1 TABLET BY MOUTH DAILY WITH SUPPER   spironolactone (ALDACTONE) 25 MG tablet TAKE 1 TABLET EVERY DAY     Allergies:   Oxycodone, Topamax [topiramate], Codeine, Penicillins, and Tramadol   Social History   Socioeconomic History   Marital status: Married    Spouse name: [name]  Number of children: 0   Years of education: college   Highest education level: Not on file  Occupational History    Comment: retired  Tobacco Use   Smoking status: Never   Smokeless tobacco: Never  Vaping Use   Vaping Use: Never used  Substance and Sexual Activity   Alcohol use: Not Currently   Drug use: No   Sexual activity: Not Currently    Birth control/protection: Post-menopausal  Other Topics Concern   Not on file  Social History Narrative   Patient lives in [place] with her husband [name])  and she is retired. College education.   Caffeine- 2-3 cups tea daily .   Social Determinants of Health   Financial Resource Strain: Not on file  Food Insecurity: Not on file  Transportation Needs: Not on file   Physical Activity: Not on file  Stress: Not on file  Social Connections: Not on file     Family History: [name] family history includes Atrial fibrillation in her mother and sister; Breast cancer in her sister; Cancer in her maternal grandmother and maternal uncle; Dementia in her father and sister; Heart disease in her sister; Hypertension in her father; Seizures in her father; Thyroid cancer in her sister.  ROS:   Please see the history of present illness.    All other systems reviewed and are negative.  EKGs/Labs/Other Studies Reviewed Today:     ILR report reviewed today - 2% atrial arrhythmia burden, predominantly flutter, some AF   Recent Labs: 04/16/2021: ALT 11; TSH 1.469 04/17/2021: Hemoglobin 12.6; Platelets 209 01/07/2022: BUN 23; Creatinine, Ser 0.87; Magnesium 2.0; Potassium 4.1; Sodium 145     Physical Exam:    VS:  BP 108/68   Pulse (!) 55   Ht 5' 9.5" (1.765 m)   Wt 200 lb (90.7 kg)   LMP 01/21/1990   SpO2 98%   BMI 29.11 kg/m     Wt Readings from Last 3 Encounters:  01/31/22 200 lb (90.7 kg)  01/07/22 195 lb 12.8 oz (88.8 kg)  11/12/21 198 lb 8 oz (90 kg)     GEN:  Well nourished, well developed in no acute distress CARDIAC: RRR, no murmurs, rubs, gallops RESPIRATORY:  Normal work of breathing MUSCULOSKELETAL: no edema    ASSESSMENT & PLAN:    Atrial fibrillation and typical flutter: She is very symptomatic. We discussed options. She prefers an early invasive strategy. Secondary hypercoagulable state: CHADS2Vasc scor of 3. Continue xarelto 20mg  daily. Medtronic Linq in place:  OSA: continue CPAP        Medication Adjustments/Labs and Tests Ordered: Current medicines are reviewed at length with the patient today.  Concerns regarding medicines are outlined above.  Orders Placed This Encounter  Procedures   EKG 12-Lead   No orders of the defined types were placed in this encounter.    Signed, Melida Quitter, MD  01/31/2022  2:46 PM    [place]

## 2022-01-31 NOTE — Patient Instructions (Addendum)
Medication Instructions:  Your physician recommends that you continue on your current medications as directed. Please refer to the Current Medication list given to you today.  *If you need a refill on your cardiac medications before your next appointment, please call your pharmacy*  Lab Work: On April 23, 2022: CBC, BMET (you do not need to be fasting) You may come to the lab any time between 7:30am and 4:30pm. If you have labs (blood work) drawn today and your tests are completely normal, you will receive your results only by: [place] (if you have MyChart) OR A paper copy in the mail If you have any lab test that is abnormal or we need to change your treatment, we will call you to review the results.  Testing/Procedures: Your physician has requested that you have cardiac CT. Cardiac computed tomography (CT) is a painless test that uses an x-ray machine to take clear, detailed pictures of your heart. For further information please visit [email or web address]. Please follow instruction sheet as given.  Your physician has recommended that you have an ablation. Catheter ablation is a medical procedure used to treat some cardiac arrhythmias (irregular heartbeats). During catheter ablation, a long, thin, flexible tube is put into a blood vessel in your groin (upper thigh), or neck. This tube is called an ablation catheter. It is then guided to your heart through the blood vessel. Radio frequency waves destroy small areas of heart tissue where abnormal heartbeats may cause an arrhythmia to start.   Your ablation procedure is scheduled for Monday May 06, 2022 with Dr. Doralee Albino at 7:30am. You will arrive at [facility] (main entrance A) at 5:30am.   Follow-Up: At [facility], you and your health needs are our priority.  As part of our continuing mission to provide you with exceptional heart care, we have created designated Provider Care Teams.  These Care Teams  include your primary Cardiologist (physician) and Advanced Practice Providers (APPs -  Physician Assistants and Nurse Practitioners) who all work together to provide you with the care you need, when you need it.  Your next appointment:   4 week(s) after your ablation  Provider:   You will follow up in the [facility] located at [facility]. Your provider will be: Roderic Palau, NP or Clint R. Fenton, PA-C

## 2022-02-08 ENCOUNTER — Encounter: Payer: Self-pay | Admitting: Cardiovascular Disease

## 2022-02-08 DIAGNOSIS — G4733 Obstructive sleep apnea (adult) (pediatric): Secondary | ICD-10-CM | POA: Diagnosis not present

## 2022-02-11 ENCOUNTER — Ambulatory Visit: Payer: Medicare PPO | Attending: Cardiology

## 2022-02-11 DIAGNOSIS — I480 Paroxysmal atrial fibrillation: Secondary | ICD-10-CM | POA: Diagnosis not present

## 2022-02-11 NOTE — Progress Notes (Signed)
Carelink Summary Report / Loop Recorder

## 2022-02-12 LAB — CUP PACEART REMOTE DEVICE CHECK
Date Time Interrogation Session: 20240119230417
Implantable Pulse Generator Implant Date: 20230524

## 2022-02-26 DIAGNOSIS — H43393 Other vitreous opacities, bilateral: Secondary | ICD-10-CM | POA: Diagnosis not present

## 2022-02-26 DIAGNOSIS — H353111 Nonexudative age-related macular degeneration, right eye, early dry stage: Secondary | ICD-10-CM | POA: Diagnosis not present

## 2022-02-26 DIAGNOSIS — H25813 Combined forms of age-related cataract, bilateral: Secondary | ICD-10-CM | POA: Diagnosis not present

## 2022-02-26 DIAGNOSIS — H353122 Nonexudative age-related macular degeneration, left eye, intermediate dry stage: Secondary | ICD-10-CM | POA: Diagnosis not present

## 2022-02-26 DIAGNOSIS — H35373 Puckering of macula, bilateral: Secondary | ICD-10-CM | POA: Diagnosis not present

## 2022-02-26 DIAGNOSIS — H35453 Secondary pigmentary degeneration, bilateral: Secondary | ICD-10-CM | POA: Diagnosis not present

## 2022-02-26 DIAGNOSIS — H35363 Drusen (degenerative) of macula, bilateral: Secondary | ICD-10-CM | POA: Diagnosis not present

## 2022-02-26 DIAGNOSIS — S0512XS Contusion of eyeball and orbital tissues, left eye, sequela: Secondary | ICD-10-CM | POA: Diagnosis not present

## 2022-02-27 DIAGNOSIS — Z1212 Encounter for screening for malignant neoplasm of rectum: Secondary | ICD-10-CM | POA: Diagnosis not present

## 2022-02-27 DIAGNOSIS — E785 Hyperlipidemia, unspecified: Secondary | ICD-10-CM | POA: Diagnosis not present

## 2022-02-27 DIAGNOSIS — E538 Deficiency of other specified B group vitamins: Secondary | ICD-10-CM | POA: Diagnosis not present

## 2022-02-27 DIAGNOSIS — I480 Paroxysmal atrial fibrillation: Secondary | ICD-10-CM | POA: Diagnosis not present

## 2022-02-28 ENCOUNTER — Encounter (HOSPITAL_COMMUNITY): Payer: Self-pay | Admitting: *Deleted

## 2022-03-07 ENCOUNTER — Other Ambulatory Visit: Payer: Self-pay | Admitting: Internal Medicine

## 2022-03-07 ENCOUNTER — Telehealth: Payer: Self-pay | Admitting: Cardiovascular Disease

## 2022-03-07 DIAGNOSIS — N1831 Chronic kidney disease, stage 3a: Secondary | ICD-10-CM | POA: Diagnosis not present

## 2022-03-07 DIAGNOSIS — J984 Other disorders of lung: Secondary | ICD-10-CM | POA: Diagnosis not present

## 2022-03-07 DIAGNOSIS — I480 Paroxysmal atrial fibrillation: Secondary | ICD-10-CM | POA: Diagnosis not present

## 2022-03-07 DIAGNOSIS — Z23 Encounter for immunization: Secondary | ICD-10-CM | POA: Diagnosis not present

## 2022-03-07 DIAGNOSIS — R251 Tremor, unspecified: Secondary | ICD-10-CM | POA: Diagnosis not present

## 2022-03-07 DIAGNOSIS — Z7901 Long term (current) use of anticoagulants: Secondary | ICD-10-CM | POA: Diagnosis not present

## 2022-03-07 DIAGNOSIS — R82998 Other abnormal findings in urine: Secondary | ICD-10-CM | POA: Diagnosis not present

## 2022-03-07 DIAGNOSIS — M7989 Other specified soft tissue disorders: Secondary | ICD-10-CM

## 2022-03-07 DIAGNOSIS — I5189 Other ill-defined heart diseases: Secondary | ICD-10-CM | POA: Diagnosis not present

## 2022-03-07 DIAGNOSIS — D6869 Other thrombophilia: Secondary | ICD-10-CM | POA: Diagnosis not present

## 2022-03-07 DIAGNOSIS — Z0000 Encounter for general adult medical examination without abnormal findings: Secondary | ICD-10-CM | POA: Diagnosis not present

## 2022-03-07 NOTE — Telephone Encounter (Signed)
Returned call to patient. She states she was mistaken, her injection is scheduled for March 1st. She states she has note been instructed to hold her blood thinner (Xarelto). Informed patient this should not  interfere with her ablation procedure scheduled for 05/06/22. Advised her that she will need to be sure to not miss any doses of Xarelto 3 weeks prior to ablation procedure. Patient verbalized understanding and expressed appreciation for follow-up.

## 2022-03-07 NOTE — Telephone Encounter (Signed)
Patient is calling stating she is scheduled for a back injection the first week of April. She states her PCP recommended she call and notify Dr. Myles Gip of this due to it possibly effecting the other things setup for April by him.

## 2022-03-13 ENCOUNTER — Ambulatory Visit
Admission: RE | Admit: 2022-03-13 | Discharge: 2022-03-13 | Disposition: A | Discharge status: Home / Self Care | Payer: Medicare PPO | Source: Ambulatory Visit | Attending: Internal Medicine | Admitting: Internal Medicine

## 2022-03-13 DIAGNOSIS — E041 Nontoxic single thyroid nodule: Secondary | ICD-10-CM | POA: Diagnosis not present

## 2022-03-13 DIAGNOSIS — M7989 Other specified soft tissue disorders: Secondary | ICD-10-CM

## 2022-03-13 DIAGNOSIS — R221 Localized swelling, mass and lump, neck: Secondary | ICD-10-CM | POA: Diagnosis not present

## 2022-03-15 LAB — CUP PACEART REMOTE DEVICE CHECK
Date Time Interrogation Session: 20240221230928
Implantable Pulse Generator Implant Date: 20230524

## 2022-03-18 ENCOUNTER — Ambulatory Visit: Payer: Medicare PPO

## 2022-03-18 DIAGNOSIS — I480 Paroxysmal atrial fibrillation: Secondary | ICD-10-CM

## 2022-03-20 ENCOUNTER — Other Ambulatory Visit: Payer: Self-pay | Admitting: Internal Medicine

## 2022-03-20 DIAGNOSIS — G939 Disorder of brain, unspecified: Secondary | ICD-10-CM

## 2022-03-22 DIAGNOSIS — M461 Sacroiliitis, not elsewhere classified: Secondary | ICD-10-CM | POA: Diagnosis not present

## 2022-03-25 DIAGNOSIS — G43719 Chronic migraine without aura, intractable, without status migrainosus: Secondary | ICD-10-CM | POA: Diagnosis not present

## 2022-03-28 NOTE — Progress Notes (Signed)
Carelink Summary Report / Loop Recorder 

## 2022-04-05 ENCOUNTER — Ambulatory Visit
Admission: RE | Admit: 2022-04-05 | Discharge: 2022-04-05 | Disposition: A | Discharge status: Home / Self Care | Payer: Medicare PPO | Source: Ambulatory Visit | Attending: Internal Medicine | Admitting: Internal Medicine

## 2022-04-05 DIAGNOSIS — G939 Disorder of brain, unspecified: Secondary | ICD-10-CM

## 2022-04-05 MED ORDER — GADOPICLENOL 0.5 MMOL/ML IV SOLN
8.0000 mL | Freq: Once | INTRAVENOUS | Status: AC | PRN
  Administered 2022-04-05: 8 mL via INTRAVENOUS

## 2022-04-16 ENCOUNTER — Other Ambulatory Visit: Payer: Medicare PPO

## 2022-04-18 DIAGNOSIS — M461 Sacroiliitis, not elsewhere classified: Secondary | ICD-10-CM | POA: Diagnosis not present

## 2022-04-18 DIAGNOSIS — Z6827 Body mass index (BMI) 27.0-27.9, adult: Secondary | ICD-10-CM | POA: Diagnosis not present

## 2022-04-19 DIAGNOSIS — G4733 Obstructive sleep apnea (adult) (pediatric): Secondary | ICD-10-CM | POA: Diagnosis not present

## 2022-04-22 ENCOUNTER — Ambulatory Visit (INDEPENDENT_AMBULATORY_CARE_PROVIDER_SITE_OTHER): Payer: Medicare PPO

## 2022-04-22 DIAGNOSIS — I480 Paroxysmal atrial fibrillation: Secondary | ICD-10-CM | POA: Diagnosis not present

## 2022-04-22 LAB — CUP PACEART REMOTE DEVICE CHECK
Date Time Interrogation Session: 20240331230548
Implantable Pulse Generator Implant Date: 20230524

## 2022-04-23 ENCOUNTER — Ambulatory Visit: Payer: Medicare PPO | Attending: Cardiovascular Disease

## 2022-04-23 DIAGNOSIS — I480 Paroxysmal atrial fibrillation: Secondary | ICD-10-CM

## 2022-04-23 DIAGNOSIS — I483 Typical atrial flutter: Secondary | ICD-10-CM

## 2022-04-23 DIAGNOSIS — Z01812 Encounter for preprocedural laboratory examination: Secondary | ICD-10-CM | POA: Diagnosis not present

## 2022-04-24 LAB — CBC
Hematocrit: 40.7 % (ref 34.0–46.6)
Hemoglobin: 13 g/dL (ref 11.1–15.9)
MCH: 29.5 pg (ref 26.6–33.0)
MCHC: 31.9 g/dL (ref 31.5–35.7)
MCV: 92 fL (ref 79–97)
Platelets: 223 10*3/uL (ref 150–450)
RBC: 4.41 x10E6/uL (ref 3.77–5.28)
RDW: 12.6 % (ref 11.7–15.4)
WBC: 8.8 10*3/uL (ref 3.4–10.8)

## 2022-04-24 LAB — BASIC METABOLIC PANEL
BUN/Creatinine Ratio: 15 (ref 12–28)
BUN: 13 mg/dL (ref 8–27)
CO2: 24 mmol/L (ref 20–29)
Calcium: 9.5 mg/dL (ref 8.7–10.3)
Chloride: 105 mmol/L (ref 96–106)
Creatinine, Ser: 0.86 mg/dL (ref 0.57–1.00)
Glucose: 73 mg/dL (ref 70–99)
Potassium: 4.3 mmol/L (ref 3.5–5.2)
Sodium: 144 mmol/L (ref 134–144)
eGFR: 72 mL/min/{1.73_m2} (ref >59)

## 2022-04-26 NOTE — Progress Notes (Signed)
Carelink Summary Report / Loop Recorder 

## 2022-04-29 ENCOUNTER — Telehealth (HOSPITAL_COMMUNITY): Payer: Self-pay | Admitting: *Deleted

## 2022-04-29 NOTE — Telephone Encounter (Signed)
Attempted to call patient regarding upcoming cardiac CT appointment. °Left message on voicemail with name and callback number ° °[name] RN Navigator Cardiac Imaging °[facility] °[phone] Office °[phone] Cell ° °

## 2022-04-29 NOTE — Telephone Encounter (Signed)
Patient returning call about her upcoming cardiac imaging study; pt verbalizes understanding of appt date/time, parking situation and where to check in, pre-test NPO status, and verified current allergies; name and call back number provided for further questions should they arise  Adreyan Carbajal RN Navigator Cardiac Imaging [facility] [phone] office [phone] cell  

## 2022-04-30 ENCOUNTER — Ambulatory Visit (HOSPITAL_BASED_OUTPATIENT_CLINIC_OR_DEPARTMENT_OTHER)
Admission: RE | Admit: 2022-04-30 | Discharge: 2022-04-30 | Disposition: A | Discharge status: Home / Self Care | Payer: Medicare PPO | Source: Ambulatory Visit | Attending: Cardiovascular Disease | Admitting: Cardiovascular Disease

## 2022-04-30 DIAGNOSIS — I480 Paroxysmal atrial fibrillation: Secondary | ICD-10-CM | POA: Diagnosis not present

## 2022-04-30 DIAGNOSIS — I483 Typical atrial flutter: Secondary | ICD-10-CM | POA: Insufficient documentation

## 2022-04-30 MED ORDER — IOHEXOL 350 MG/ML SOLN
100.0000 mL | Freq: Once | INTRAVENOUS | Status: AC | PRN
  Administered 2022-04-30: 80 mL via INTRAVENOUS

## 2022-05-01 ENCOUNTER — Ambulatory Visit (HOSPITAL_BASED_OUTPATIENT_CLINIC_OR_DEPARTMENT_OTHER): Payer: Medicare PPO | Admitting: Pulmonary Disease

## 2022-05-01 ENCOUNTER — Encounter (HOSPITAL_BASED_OUTPATIENT_CLINIC_OR_DEPARTMENT_OTHER): Payer: Self-pay | Admitting: Pulmonary Disease

## 2022-05-01 VITALS — BP 122/72 | HR 64 | Temp 97.9°F | Ht 69.0 in | Wt 196.2 lb

## 2022-05-01 DIAGNOSIS — R0609 Other forms of dyspnea: Secondary | ICD-10-CM

## 2022-05-01 DIAGNOSIS — G4733 Obstructive sleep apnea (adult) (pediatric): Secondary | ICD-10-CM

## 2022-05-01 NOTE — Assessment & Plan Note (Signed)
Previous CPET has not shown cardiopulmonary limitation, attributed to deconditioning as a result of neurological and cardiac illness

## 2022-05-01 NOTE — Progress Notes (Signed)
   Subjective:    Patient ID: [name], female    DOB: 04/24/1950, 72 y.o.   MRN: [id number]  HPI  72 yo never smoker, retired Psychiatrist for FU of OSA --persistent dyspnea and Mild restriction on PFTs.   PMH -  atrial fibrillation controlled on Tikosyn. She has restrictive cardiomyopathy with negative cardiac biopsy at [facility] in the past.  Essential tremor following with Neuro (neg aldolase/ck/acetylcholine recept)   -constellation of symptoms including severe weight loss, unexplained dyspnea and weak voice with essential tremors.  Previous work-up for neuromuscular disease has been negative   Annual follow-up visit Tremors appear to be worse.  She is on high-dose gabapentin  she remains on Tikosyn and rivaroxaban ablation is planned.  Reviewed cardiology/EP visit. She is more compliant with her CPAP machine.  She is using an AirFit F30 fullface mask, husband reports a large leak Reports palpitations and shortness of breath on occasion.  She also has a loop recorder Weight is unchanged    Significant tests/ events reviewed   CPET 03/2016 no clear indication for cardiopulmonary limitation. At peak exercise and with flat O2 pulse pattern, patient is likely limited due to diastolic dysfunction as a result of her body habitus. Expect weight loss and regular exercise would greatly improve exercise intolerance.   PFT 07/2020 very mild restrictive lung disease, normal DLCO?  Extraparenchymal   PFTs 01/2016 showed ratio 72, FVC 74%, FEV1 69%, TLC 91% and DLCO 73% suggesting moderate intraparenchymal restriction sniff test >> no  diaphragmatic dysfunction     CPAP ttiration 01/2021 >> 10 cm  PSG 2008 at [facility] showed total sleep time 362 minutes and a low AHI PSG 02/2016 showed mild OSA with AHI 8/hour   Echo 01/2020 normal LVEF grade 2 diastolic dysfunction, normal RVSP Echo 01/2016 showed grade 2 diastolic dysfunction with RVSP 35   CT cors 02/2018 low risk    02/2016 sniff test normal diaphragmatic function Review of Systems neg for any significant sore throat, dysphagia, itching, sneezing, nasal congestion or excess/ purulent secretions, fever, chills, sweats, unintended wt loss, pleuritic or exertional cp, hempoptysis, orthopnea pnd or change in chronic leg swelling. Also denies presyncope, palpitations, heartburn, abdominal pain, nausea, vomiting, diarrhea or change in bowel or urinary habits, dysuria,hematuria, rash, arthralgias, visual complaints, headache, numbness weakness or ataxia.     Objective:   Physical Exam  Gen. Pleasant, obese, in no distress ENT - no lesions, no post nasal drip Neck: No JVD, no thyromegaly, no carotid bruits Lungs: no use of accessory muscles, no dullness to percussion, decreased without rales or rhonchi  Cardiovascular: Rhythm regular, heart sounds  normal, no murmurs or gallops, no peripheral edema Musculoskeletal: No deformities, no cyanosis or clubbing , no tremors       Assessment & Plan:

## 2022-05-01 NOTE — Patient Instructions (Signed)
X trial of Philips DreamWear full facemask to try and decrease leak

## 2022-05-01 NOTE — Assessment & Plan Note (Signed)
CPAP download shows residual AHI of 8/hour on auto settings 5 to 15 cm with average pressure 12 and max pressure 13 cm.  She continues to have a large leak from an AirFit F30 fullface mask.  We reviewed different types of masks and she seemed to prefer the Philips DreamWear fullface, we will provide her with a prescription We discussed close correlation between atrial fibrillation and OSA especially to increase the chances of her staying in rhythm  Weight loss encouraged, compliance with goal of at least 4-6 hrs every night is the expectation. Advised against medications with sedative side effects Cautioned against driving when sleepy - understanding that sleepiness will vary on a day to day basis

## 2022-05-03 NOTE — Pre-Procedure Instructions (Signed)
Attempted to call patient regarding procedure instructions.  Left voicemail on the following items: Arrival time 5:15 Nothing to eat or drink after midnight No meds AM of procedure Responsible person to drive you home and stay with you for 24 hrs  Have you missed any doses of anti-coagulant Xarelto- if you have missed any doses please let the office know right way.

## 2022-05-06 ENCOUNTER — Encounter (HOSPITAL_COMMUNITY)
Admission: RE | Disposition: A | Discharge status: Home / Self Care | Payer: Medicare PPO | Source: Home / Self Care | Attending: Cardiovascular Disease

## 2022-05-06 ENCOUNTER — Ambulatory Visit (HOSPITAL_COMMUNITY): Payer: Medicare PPO | Admitting: Anesthesiology

## 2022-05-06 ENCOUNTER — Ambulatory Visit (HOSPITAL_BASED_OUTPATIENT_CLINIC_OR_DEPARTMENT_OTHER): Payer: Medicare PPO | Admitting: Anesthesiology

## 2022-05-06 ENCOUNTER — Encounter (HOSPITAL_COMMUNITY): Payer: Self-pay | Admitting: Cardiovascular Disease

## 2022-05-06 ENCOUNTER — Other Ambulatory Visit: Payer: Self-pay

## 2022-05-06 ENCOUNTER — Other Ambulatory Visit (HOSPITAL_COMMUNITY): Payer: Self-pay

## 2022-05-06 ENCOUNTER — Ambulatory Visit (HOSPITAL_COMMUNITY)
Admission: RE | Admit: 2022-05-06 | Discharge: 2022-05-06 | Disposition: A | Discharge status: Home / Self Care | Payer: Medicare PPO | Attending: Cardiovascular Disease | Admitting: Cardiovascular Disease

## 2022-05-06 DIAGNOSIS — G4730 Sleep apnea, unspecified: Secondary | ICD-10-CM

## 2022-05-06 DIAGNOSIS — N289 Disorder of kidney and ureter, unspecified: Secondary | ICD-10-CM | POA: Diagnosis not present

## 2022-05-06 DIAGNOSIS — I483 Typical atrial flutter: Secondary | ICD-10-CM | POA: Diagnosis not present

## 2022-05-06 DIAGNOSIS — I4892 Unspecified atrial flutter: Secondary | ICD-10-CM | POA: Insufficient documentation

## 2022-05-06 DIAGNOSIS — I4891 Unspecified atrial fibrillation: Secondary | ICD-10-CM | POA: Diagnosis not present

## 2022-05-06 DIAGNOSIS — G4733 Obstructive sleep apnea (adult) (pediatric): Secondary | ICD-10-CM | POA: Diagnosis not present

## 2022-05-06 DIAGNOSIS — Z7901 Long term (current) use of anticoagulants: Secondary | ICD-10-CM | POA: Insufficient documentation

## 2022-05-06 DIAGNOSIS — I480 Paroxysmal atrial fibrillation: Secondary | ICD-10-CM | POA: Insufficient documentation

## 2022-05-06 DIAGNOSIS — Z8249 Family history of ischemic heart disease and other diseases of the circulatory system: Secondary | ICD-10-CM | POA: Insufficient documentation

## 2022-05-06 DIAGNOSIS — D6869 Other thrombophilia: Secondary | ICD-10-CM | POA: Insufficient documentation

## 2022-05-06 HISTORY — PX: ATRIAL FIBRILLATION ABLATION: EP1191

## 2022-05-06 HISTORY — PX: A-FLUTTER ABLATION: EP1230

## 2022-05-06 LAB — POCT ACTIVATED CLOTTING TIME: Activated Clotting Time: 385 seconds

## 2022-05-06 SURGERY — ATRIAL FIBRILLATION ABLATION
Anesthesia: General

## 2022-05-06 MED ORDER — DEXAMETHASONE SODIUM PHOSPHATE 10 MG/ML IJ SOLN
INTRAMUSCULAR | Status: DC | PRN
  Administered 2022-05-06: 10 mg via INTRAVENOUS

## 2022-05-06 MED ORDER — HEPARIN SODIUM (PORCINE) 1000 UNIT/ML IJ SOLN
INTRAMUSCULAR | Status: DC | PRN
  Administered 2022-05-06: 16000 [IU] via INTRAVENOUS

## 2022-05-06 MED ORDER — SODIUM CHLORIDE 0.9 % IV SOLN: 250.0000 mL | INTRAVENOUS | Status: DC | PRN

## 2022-05-06 MED ORDER — HEPARIN SODIUM (PORCINE) 1000 UNIT/ML IJ SOLN
INTRAMUSCULAR | Status: DC | PRN
  Administered 2022-05-06: 1000 [IU] via INTRAVENOUS

## 2022-05-06 MED ORDER — PANTOPRAZOLE SODIUM 40 MG PO TBEC
40.0000 mg | DELAYED_RELEASE_TABLET | Freq: Every day | ORAL | 0 refills | Status: DC
  Filled 2022-05-06: qty 30, 30d supply, fill #0

## 2022-05-06 MED ORDER — HEPARIN (PORCINE) IN NACL 1000-0.9 UT/500ML-% IV SOLN
INTRAVENOUS | Status: DC | PRN
  Administered 2022-05-06 (×4): 500 mL

## 2022-05-06 MED ORDER — ONDANSETRON HCL 4 MG/2ML IJ SOLN
INTRAMUSCULAR | Status: DC | PRN
  Administered 2022-05-06: 4 mg via INTRAVENOUS

## 2022-05-06 MED ORDER — PHENYLEPHRINE HCL-NACL 20-0.9 MG/250ML-% IV SOLN
INTRAVENOUS | Status: DC | PRN
  Administered 2022-05-06: 30 ug/min via INTRAVENOUS

## 2022-05-06 MED ORDER — PROPOFOL 10 MG/ML IV BOLUS
INTRAVENOUS | Status: DC | PRN
  Administered 2022-05-06: 150 mg via INTRAVENOUS

## 2022-05-06 MED ORDER — ROCURONIUM BROMIDE 10 MG/ML (PF) SYRINGE
PREFILLED_SYRINGE | INTRAVENOUS | Status: DC | PRN
  Administered 2022-05-06: 90 mg via INTRAVENOUS
  Administered 2022-05-06: 10 mg via INTRAVENOUS

## 2022-05-06 MED ORDER — PROTAMINE SULFATE 10 MG/ML IV SOLN
INTRAVENOUS | Status: DC | PRN
  Administered 2022-05-06: 50 mg via INTRAVENOUS

## 2022-05-06 MED ORDER — DOBUTAMINE INFUSION FOR EP/ECHO/NUC (1000 MCG/ML)
INTRAVENOUS | Status: AC
  Filled 2022-05-06: qty 250

## 2022-05-06 MED ORDER — PROMETHAZINE HCL 25 MG/ML IJ SOLN: 6.2500 mg | INTRAMUSCULAR | Status: DC | PRN

## 2022-05-06 MED ORDER — FENTANYL CITRATE (PF) 250 MCG/5ML IJ SOLN
INTRAMUSCULAR | Status: DC | PRN
  Administered 2022-05-06 (×2): 50 ug via INTRAVENOUS

## 2022-05-06 MED ORDER — DOBUTAMINE INFUSION FOR EP/ECHO/NUC (1000 MCG/ML)
INTRAVENOUS | Status: DC | PRN
  Administered 2022-05-06: 20 ug/kg/min via INTRAVENOUS

## 2022-05-06 MED ORDER — SUGAMMADEX SODIUM 200 MG/2ML IV SOLN
INTRAVENOUS | Status: DC | PRN
  Administered 2022-05-06: 200 mg via INTRAVENOUS

## 2022-05-06 MED ORDER — LIDOCAINE 2% (20 MG/ML) 5 ML SYRINGE
INTRAMUSCULAR | Status: DC | PRN
  Administered 2022-05-06: 60 mg via INTRAVENOUS

## 2022-05-06 MED ORDER — SODIUM CHLORIDE 0.9% FLUSH: 3.0000 mL | INTRAVENOUS | Status: DC | PRN

## 2022-05-06 MED ORDER — ACETAMINOPHEN 325 MG PO TABS: 650.0000 mg | ORAL_TABLET | ORAL | Status: DC | PRN

## 2022-05-06 MED ORDER — SODIUM CHLORIDE 0.9 % IV SOLN: INTRAVENOUS | Status: DC

## 2022-05-06 MED ORDER — HEPARIN SODIUM (PORCINE) 1000 UNIT/ML IJ SOLN
INTRAMUSCULAR | Status: AC
  Filled 2022-05-06: qty 10

## 2022-05-06 MED ORDER — ONDANSETRON HCL 4 MG/2ML IJ SOLN: 4.0000 mg | Freq: Four times a day (QID) | INTRAMUSCULAR | Status: DC | PRN

## 2022-05-06 MED ORDER — COLCHICINE 0.6 MG PO TABS
0.6000 mg | ORAL_TABLET | Freq: Two times a day (BID) | ORAL | 0 refills | Status: DC
  Filled 2022-05-06: qty 10, 5d supply, fill #0

## 2022-05-06 MED ORDER — HYDROMORPHONE HCL 1 MG/ML IJ SOLN: 0.2500 mg | INTRAMUSCULAR | Status: DC | PRN

## 2022-05-06 MED ORDER — AMISULPRIDE (ANTIEMETIC) 5 MG/2ML IV SOLN: 10.0000 mg | Freq: Once | INTRAVENOUS | Status: DC | PRN

## 2022-05-06 SURGICAL SUPPLY — 19 items
CATH 8FR REPROCESSED SOUNDSTAR (CATHETERS) ×1 IMPLANT
CATH 8FR SOUNDSTAR REPROCESSED (CATHETERS) IMPLANT
CATH ABLAT QDOT MICRO BI TC FJ (CATHETERS) IMPLANT
CATH OCTARAY 2.0 F 3-3-3-3-3 (CATHETERS) IMPLANT
CATH PIGTAIL STEERABLE D1 8.7 (WIRE) IMPLANT
CATH S-M CIRCA TEMP PROBE (CATHETERS) IMPLANT
CATH WEBSTER BI DIR CS D-F CRV (CATHETERS) IMPLANT
CLOSURE PERCLOSE PROSTYLE (VASCULAR PRODUCTS) IMPLANT
COVER SWIFTLINK CONNECTOR (BAG) ×1 IMPLANT
DEVICE CLOSURE MYNXGRIP 6/7F (Vascular Products) IMPLANT
PACK EP LATEX FREE (CUSTOM PROCEDURE TRAY) ×1
PACK EP LF (CUSTOM PROCEDURE TRAY) ×1 IMPLANT
PAD DEFIB RADIO PHYSIO CONN (PAD) ×1 IMPLANT
PATCH CARTO3 (PAD) IMPLANT
SHEATH CARTO VIZIGO MED CURVE (SHEATH) IMPLANT
SHEATH PINNACLE 8F 10CM (SHEATH) IMPLANT
SHEATH PINNACLE 9F 10CM (SHEATH) IMPLANT
SHEATH PROBE COVER 6X72 (BAG) IMPLANT
TUBING SMART ABLATE COOLFLOW (TUBING) IMPLANT

## 2022-05-06 NOTE — Transfer of Care (Signed)
Immediate Anesthesia Transfer of Care Note  Patient: [name]  Procedure(s) Performed: ATRIAL FIBRILLATION ABLATION A-FLUTTER ABLATION  Patient Location: Cath Lab  Anesthesia Type:General  Level of Consciousness: awake, alert , and oriented  Airway & Oxygen Therapy: Patient Spontanous Breathing and Patient connected to nasal cannula oxygen  Post-op Assessment: Report given to RN, Post -op Vital signs reviewed and stable, and Patient moving all extremities X 4  Post vital signs: Reviewed and stable  Last Vitals:  Vitals Value Taken Time  BP 113/59 05/06/22 1040  Temp    Pulse 72 05/06/22 1044  Resp 13 05/06/22 1044  SpO2 95 % 05/06/22 1044  Vitals shown include unvalidated device data.  Last Pain:  Vitals:   05/06/22 0601  TempSrc: Oral  PainSc:          Complications: There were no known notable events for this encounter.

## 2022-05-06 NOTE — Discharge Instructions (Signed)

## 2022-05-06 NOTE — Anesthesia Preprocedure Evaluation (Signed)
Anesthesia Evaluation    Airway Mallampati: II  TM Distance: >3 FB Neck ROM: Full    Dental no notable dental hx.    Pulmonary sleep apnea    Pulmonary exam normal breath sounds clear to auscultation       Cardiovascular Normal cardiovascular exam+ dysrhythmias Atrial Fibrillation  Rhythm:Regular Rate:Normal     Neuro/Psych  Headaches    GI/Hepatic   Endo/Other    Renal/GU Renal InsufficiencyRenal disease     Musculoskeletal   Abdominal   Peds  Hematology   Anesthesia Other Findings   Reproductive/Obstetrics                             Anesthesia Physical Anesthesia Plan  ASA: 3  Anesthesia Plan: General   Post-op Pain Management: Minimal or no pain anticipated   Induction: Intravenous  PONV Risk Score and Plan: 3 and Ondansetron, Dexamethasone, Midazolam and Treatment may vary due to age or medical condition  Airway Management Planned: Oral ETT  Additional Equipment:   Intra-op Plan:   Post-operative Plan: Extubation in OR  Informed Consent: I have reviewed the patients History and Physical, chart, labs and discussed the procedure including the risks, benefits and alternatives for the proposed anesthesia with the patient or authorized representative who has indicated his/her understanding and acceptance.     Dental advisory given  Plan Discussed with: CRNA  Anesthesia Plan Comments:        Anesthesia Quick Evaluation

## 2022-05-06 NOTE — H&P (Signed)
Electrophysiology Office Note:    Date:  05/06/2022   ID:  [name], DOB 1950/11/30, MRN [id number]  PCP:  Creola Corn, MD   [facility] Providers Cardiologist:  Kristeen Miss, MD Electrophysiologist:  Maurice Small, MD     Referring MD: No ref. provider found   History of Present Illness:    [name] is a 72 y.o. female with a hx listed below, significant for paroxysmal atrial fibrillation, referred for arrhythmia management.  She has a history of atrial fibrillation managed with Tikosyn. She had breakthrough episodes of AF and flutter.  Linq interrogation today shows a 2% arrhythmia burden, the majority of which appears to be an atrial flutter.   She is very symptomatic with AF - very fatigued, short of breath.  I reviewed the patient's CT and labs. There was no LAA thrombus. she  has not missed any doses of anticoagulation, and she took her dose last night. There have been no changes in the patient's diagnoses, medications, or condition since our recent clinic visit.   Past Medical History:  Diagnosis Date   Atrial fibrillation    Back pain    nerve problems-had series of three injections   Bone spur    Spine   Chronic anticoagulation    Diastolic congestive heart failure    Hearing loss    History of shingles    Hyperlipemia    Migraine headache    Obstructive sleep apnea    uses CPAP   OSA on CPAP    Paroxysmal atrial fibrillation    Restrictive cardiomyopathy    Tremor    Tubal pregnancy     Past Surgical History:  Procedure Laterality Date   BREAST EXCISIONAL BIOPSY Left    benign   CARDIAC CATHETERIZATION     CARDIOVERSION     CHOLECYSTECTOMY     ECTOPIC PREGNANCY SURGERY  1980   EYE SURGERY     left eye injections x2-was having "stroke like" flashes of light   heart biopsy  2008   [facility]   HYSTEROSCOPY  06/2003   and D&C-simple hyperplasia secondary PMP bleeding   implantable loop recorder placement  06/13/2021    Medtronic Reveal Linq model LNQ 22 (SN [id number]) implantable loop recorder implanted for syncope and AF management   ROTATOR CUFF REPAIR     bilateral    Current Medications: Current Meds  Medication Sig   atorvastatin (LIPITOR) 20 MG tablet TAKE 1 TABLET BY MOUTH DAILY AT 6PM (Patient taking differently: Take 20 mg by mouth daily with supper.)   Cholecalciferol (VITAMIN D-3 PO) Take 5,000 Units by mouth daily with supper.   Cyanocobalamin (VITAMIN B-12) 5000 MCG SUBL Take 5,000 mcg by mouth daily with supper.   dofetilide (TIKOSYN) 125 MCG capsule Take 3 capsules (375 mcg total) by mouth 2 (two) times daily.   ezetimibe (ZETIA) 10 MG tablet TAKE 1 TABLET EVERY DAY   gabapentin (NEURONTIN) 600 MG tablet Take 1 tablet (600 mg total) by mouth 3 (three) times daily.   Multiple Vitamins-Minerals (PRESERVISION AREDS 2 PO) Take 1 capsule by mouth 2 (two) times daily. With breakfast & with supper   NONFORMULARY OR COMPOUNDED ITEM Place 1 spray into both nostrils 2 (two) times daily as needed (migraine headaches.). Lidocaine Isotonic Nasal Spray 4% Compounded via [facility]   Polyethyl Glycol-Propyl Glycol (SYSTANE) 0.4-0.3 % SOLN Place 1 drop into both eyes in the morning.   propranolol (INDERAL) 10 MG tablet Take 1  tablet (10 mg total) by mouth every 6 (six) hours as needed (for palpitations).   rivaroxaban (XARELTO) 20 MG TABS tablet TAKE 1 TABLET BY MOUTH DAILY WITH SUPPER (Patient taking differently: Take 20 mg by mouth daily with breakfast.)   spironolactone (ALDACTONE) 25 MG tablet TAKE 1 TABLET EVERY DAY     Allergies:   Oxycodone, Topamax [topiramate], Codeine, Other, Penicillins, and Tramadol   Social History   Socioeconomic History   Marital status: Married    Spouse name: [name]   Number of children: 0   Years of education: college   Highest education level: Not on file  Occupational History    Comment: retired  Tobacco Use   Smoking status: Never   Smokeless  tobacco: Never  Vaping Use   Vaping Use: Never used  Substance and Sexual Activity   Alcohol use: Not Currently   Drug use: No   Sexual activity: Not Currently    Birth control/protection: Post-menopausal  Other Topics Concern   Not on file  Social History Narrative   Patient lives in [place] with her husband [name])  and she is retired. College education.   Caffeine- 2-3 cups tea daily .   Social Determinants of Health   Financial Resource Strain: Not on file  Food Insecurity: Not on file  Transportation Needs: Not on file  Physical Activity: Not on file  Stress: Not on file  Social Connections: Not on file     Family History: [name] family history includes Atrial fibrillation in her mother and sister; Breast cancer in her sister; Cancer in her maternal grandmother and maternal uncle; Dementia in her father and sister; Heart disease in her sister; Hypertension in her father; Seizures in her father; Thyroid cancer in her sister.  ROS:   Please see the history of present illness.    All other systems reviewed and are negative.  EKGs/Labs/Other Studies Reviewed Today:     ILR report reviewed today - 2% atrial arrhythmia burden, predominantly flutter, some AF   Recent Labs: 01/07/2022: Magnesium 2.0 04/23/2022: BUN 13; Creatinine, Ser 0.86; Hemoglobin 13.0; Platelets 223; Potassium 4.3; Sodium 144     Physical Exam:    VS:  BP 120/67   Pulse (!) 57   Temp 97.7 F (36.5 C) (Oral)   Ht 5\' 9"  (1.753 m)   Wt 87.1 kg   LMP 01/21/1990   SpO2 99%   BMI 28.35 kg/m     Wt Readings from Last 3 Encounters:  05/06/22 87.1 kg  05/01/22 89 kg  01/31/22 90.7 kg     GEN:  Well nourished, well developed in no acute distress CARDIAC: RRR, no murmurs, rubs, gallops RESPIRATORY:  Normal work of breathing MUSCULOSKELETAL: no edema    ASSESSMENT & PLAN:    Atrial fibrillation and typical flutter: She is very symptomatic. We discussed options. She prefers an early  invasive strategy. Will perform CTI line and PVI today. Secondary hypercoagulable state: CHADS2Vasc scor of 3. Continue xarelto 20mg  daily. Medtronic Linq in place:  OSA: continue CPAP     We discussed the indication, rationale, logistics, anticipated benefits, and potential risks of the ablation procedure including but not limited to -- bleed at the groin access site, chest pain, damage to nearby organs such as the diaphragm, lungs, or esophagus, need for a drainage tube, or prolonged hospitalization. I explained that the risk for stroke, heart attack, need for open chest surgery, or even death is very low but not zero. she  expressed  understanding and wishes to proceed.    Medication Adjustments/Labs and Tests Ordered: Current medicines are reviewed at length with the patient today.  Concerns regarding medicines are outlined above.  Orders Placed This Encounter  Procedures   Diet NPO time specified   Informed Consent Details: Physician/Practitioner Attestation; Transcribe to consent form and obtain patient signature   Initiate Pre-op Protocol   Void on call to EP Lab   Confirm CBC and BMP (or CMP) results within 7 days for inpatient and 30 days for outpatient:   Clip right and left femoral area PM before surgery   Clip right internal jugular area PM before surgery   Pre-admission testing diagnosis   EP STUDY   Insert peripheral IV   Meds ordered this encounter  Medications   0.9 %  sodium chloride infusion     Signed, Maurice Small, MD  05/06/2022 6:55 AM    [facility]

## 2022-05-06 NOTE — Anesthesia Postprocedure Evaluation (Signed)
Anesthesia Post Note  Patient: [name]  Procedure(s) Performed: ATRIAL FIBRILLATION ABLATION A-FLUTTER ABLATION     Patient location during evaluation: PACU Anesthesia Type: General Level of consciousness: awake and alert Pain management: pain level controlled Vital Signs Assessment: post-procedure vital signs reviewed and stable Respiratory status: spontaneous breathing, nonlabored ventilation and respiratory function stable Cardiovascular status: blood pressure returned to baseline and stable Postop Assessment: no apparent nausea or vomiting Anesthetic complications: no   There were no known notable events for this encounter.  Last Vitals:  Vitals:   05/06/22 1145 05/06/22 1200  BP: 101/63 100/61  Pulse: 60 60  Resp: 11 14  Temp:    SpO2: 95% 95%    Last Pain:  Vitals:   05/06/22 1110  TempSrc: Temporal  PainSc: 0-No pain                 Lowella Curb

## 2022-05-06 NOTE — Anesthesia Procedure Notes (Signed)
Procedure Name: Intubation Date/Time: 05/06/2022 7:47 AM  Performed by: Quentin Ore, CRNAPre-anesthesia Checklist: Patient identified, Emergency Drugs available, Suction available and Patient being monitored Patient Re-evaluated:Patient Re-evaluated prior to induction Oxygen Delivery Method: Circle system utilized Preoxygenation: Pre-oxygenation with 100% oxygen Induction Type: IV induction Ventilation: Mask ventilation without difficulty Laryngoscope Size: Mac and 3 Grade View: Grade I Tube type: Oral Tube size: 7.0 mm Number of attempts: 1 Airway Equipment and Method: Stylet Placement Confirmation: ETT inserted through vocal cords under direct vision, positive ETCO2 and breath sounds checked- equal and bilateral Secured at: 21 cm Tube secured with: Tape Dental Injury: Teeth and Oropharynx as per pre-operative assessment

## 2022-05-06 NOTE — Progress Notes (Signed)
Notified Dr Nelly Laurence that patient found a tick on her Right leg where her underwear lining is.  It was removed by her husband.  Dr Nelly Laurence said ok to proceed.

## 2022-05-06 NOTE — Progress Notes (Signed)
Patient and husband was given discharge instructions. Both verbalized understanding. 

## 2022-05-16 ENCOUNTER — Telehealth: Payer: Self-pay | Admitting: Cardiovascular Disease

## 2022-05-16 NOTE — Telephone Encounter (Signed)
Pain left shoulder blade and from shoulder all the way to her hand.  Had some bicep pain previously.  No tingling or numbness.  It hurts to raise arm.   She is short of breath and fatigued.   The shortness of breath is the same as prior to procedure and fatigue is probably worse.  She doesn't think she is in afib right now.  Faint bruises on back from pads and electrodes.  The left shoulder blade is the most painful and tender to touch.    She has nausea for past 2 days, much worse today.   She and her husband are unsure what to expect and don't want to be missing anything.  Her CT showed her in the 1st percentile for coronary calcium.    Adv we will send to Dr. Nelly Laurence and will call back with recommendations.

## 2022-05-16 NOTE — Telephone Encounter (Signed)
Patient's husband states the patient has been having pain in her left arm radiating down her arm. He says she has had it since her procedure 10 days ago. He says he is not sure if this is just muscle soreness or if she needs to be seen. He says she has also had nausea, fatigue, and difficulty sleeping. Please advise.

## 2022-05-17 ENCOUNTER — Ambulatory Visit (HOSPITAL_COMMUNITY)
Admission: RE | Admit: 2022-05-17 | Discharge: 2022-05-17 | Disposition: A | Discharge status: Home / Self Care | Payer: Medicare PPO | Source: Ambulatory Visit | Attending: Internal Medicine | Admitting: Internal Medicine

## 2022-05-17 VITALS — BP 134/80 | HR 57 | Ht 69.0 in | Wt 191.4 lb

## 2022-05-17 DIAGNOSIS — Z6828 Body mass index (BMI) 28.0-28.9, adult: Secondary | ICD-10-CM | POA: Diagnosis not present

## 2022-05-17 DIAGNOSIS — I483 Typical atrial flutter: Secondary | ICD-10-CM

## 2022-05-17 DIAGNOSIS — I4892 Unspecified atrial flutter: Secondary | ICD-10-CM | POA: Diagnosis not present

## 2022-05-17 DIAGNOSIS — G4733 Obstructive sleep apnea (adult) (pediatric): Secondary | ICD-10-CM | POA: Insufficient documentation

## 2022-05-17 DIAGNOSIS — Z7901 Long term (current) use of anticoagulants: Secondary | ICD-10-CM | POA: Insufficient documentation

## 2022-05-17 DIAGNOSIS — D6869 Other thrombophilia: Secondary | ICD-10-CM | POA: Insufficient documentation

## 2022-05-17 DIAGNOSIS — E669 Obesity, unspecified: Secondary | ICD-10-CM | POA: Diagnosis not present

## 2022-05-17 DIAGNOSIS — I4819 Other persistent atrial fibrillation: Secondary | ICD-10-CM | POA: Insufficient documentation

## 2022-05-17 NOTE — Telephone Encounter (Signed)
Spoke with the patient's husband and advised on recommendations from Dr. Nelly Laurence. We do not have any availability at our office for an appointment today. I called AFIB clinic and they are able to see the patient at 10am this morning. Patient is agreeable to plan.

## 2022-05-17 NOTE — Addendum Note (Signed)
Encounter addended by: Learta Codding, CMA on: 05/17/2022 3:59 PM  Actions taken: Order Reconciliation Section accessed

## 2022-05-17 NOTE — Progress Notes (Signed)
Primary Care Physician: Creola Corn, MD Primary Cardiologist: Dr. Elease Hashimoto Primary Electrophysiologist: Dr. Nelly Laurence Referring Physician: Dr. Alen Blew [name] is a 72 y.o. female with a history of OSA on CPAP, tremor, HLD, diastolic congestive heart failure, coronary artery calcium score 0 on 02/25/2018, migraines, and atrial fibrillation who presents for consultation in the [facility]. She is s/p Afib and atrial flutter ablation on 05/06/22 by Dr. Nelly Laurence. She is on Tikosyn 375 mcg BID and was having breakthrough episodes of Afib feeling very symptomatic. Patient is on Xarelto 20 mg daily for a CHADS2VASC score of 3.  On evaluation today, she is currently in SR. She is now s/p Afib and atrial flutter ablation on 05/06/22 by Dr. Nelly Laurence with no post procedure complications. Noted that tick found on right leg which was removed immediately before procedure.   Review of records show husband called 4/25 noting patient has pain in left arm radiating down arm, nausea, fatigue, and difficulty sleeping since procedure. Pain is left shoulder blade and from shoulder down to hand. No tingling or numbness; hurts to raise arm. Some shortness of breath and fatigue. She states hand was tingling yesterday but now resolved. Left shoulder pain and left bicep pain somewhat better than yesterday.  She is compliant with anticoagulation and has not missed any doses. She has no bleeding concerns.  Today, she denies symptoms of palpitations, chest pain, orthopnea, PND, lower extremity edema, dizziness, presyncope, syncope, daytime somnolence, bleeding, or neurologic sequela. The patient is tolerating medications without difficulties and is otherwise without complaint today.   Atrial Fibrillation Risk Factors:  she does have symptoms or diagnosis of sleep apnea. she is compliant with CPAP therapy. she does not have a history of rheumatic fever. she does not have a history of alcohol  use. The patient does not have a history of early familial atrial fibrillation or other arrhythmias.  she has a BMI of Body mass index is 28.26 kg/m.[name] Filed Weights   05/17/22 0948  Weight: 86.8 kg    Family History  Problem Relation Age of Onset   Hypertension Father    Dementia Father    Seizures Father    Atrial fibrillation Mother    Cancer Maternal Grandmother        mouth   Cancer Maternal Uncle        unknown type   Thyroid cancer Sister    Breast cancer Sister        breast cancer   Heart disease Sister    Atrial fibrillation Sister    Dementia Sister      Atrial Fibrillation Management history:  Previous antiarrhythmic drugs: Tikosyn Previous cardioversions:  Previous ablations: 05/06/22 Anticoagulation history: Xarelto 20 mg daily   Past Medical History:  Diagnosis Date   Atrial fibrillation (HCC)    Back pain    nerve problems-had series of three injections   Bone spur    Spine   Chronic anticoagulation    Diastolic congestive heart failure (HCC)    Hearing loss    History of shingles    Hyperlipemia    Migraine headache    Obstructive sleep apnea    uses CPAP   OSA on CPAP    Paroxysmal atrial fibrillation (HCC)    Restrictive cardiomyopathy (HCC)    Tremor    Tubal pregnancy    Past Surgical History:  Procedure Laterality Date   A-FLUTTER ABLATION N/A 05/06/2022   Procedure: A-FLUTTER ABLATION;  Surgeon:  Mealor, Roberts Gaudy, MD;  [facility];  Service: Cardiovascular;  Laterality: N/A;   ATRIAL FIBRILLATION ABLATION N/A 05/06/2022   Procedure: ATRIAL FIBRILLATION ABLATION;  Surgeon: Maurice Small, MD;  Location: [facility];  Service: Cardiovascular;  Laterality: N/A;   BREAST EXCISIONAL BIOPSY Left    benign   CARDIAC CATHETERIZATION     CARDIOVERSION     CHOLECYSTECTOMY     ECTOPIC PREGNANCY SURGERY  1980   EYE SURGERY     left eye injections x2-was having "stroke like" flashes of light   heart biopsy   2008   [facility]   HYSTEROSCOPY  06/2003   and D&C-simple hyperplasia secondary PMP bleeding   implantable loop recorder placement  06/13/2021   Medtronic Reveal Linq model LNQ 22 (SN [id number]) implantable loop recorder implanted for syncope and AF management   ROTATOR CUFF REPAIR     bilateral    Current Outpatient Medications  Medication Sig Dispense Refill   atorvastatin (LIPITOR) 20 MG tablet TAKE 1 TABLET BY MOUTH DAILY AT 6PM (Patient taking differently: Take 20 mg by mouth daily with supper.) 90 tablet 2   Cholecalciferol (VITAMIN D-3 PO) Take 5,000 Units by mouth daily with supper.     Cyanocobalamin (VITAMIN B-12) 5000 MCG SUBL Take 5,000 mcg by mouth daily with supper.     dofetilide (TIKOSYN) 125 MCG capsule Take 3 capsules (375 mcg total) by mouth 2 (two) times daily. 540 capsule 3   ezetimibe (ZETIA) 10 MG tablet TAKE 1 TABLET EVERY DAY 90 tablet 3   gabapentin (NEURONTIN) 600 MG tablet Take 1 tablet (600 mg total) by mouth 3 (three) times daily. 90 tablet 6   Multiple Vitamins-Minerals (PRESERVISION AREDS 2 PO) Take 1 capsule by mouth 2 (two) times daily. With breakfast & with supper     NONFORMULARY OR COMPOUNDED ITEM Place 1 spray into both nostrils 2 (two) times daily as needed (migraine headaches.). Lidocaine Isotonic Nasal Spray 4% Compounded via [facility]     pantoprazole (PROTONIX) 40 MG tablet Take 1 tablet (40 mg total) by mouth daily. 30 tablet 0   Polyethyl Glycol-Propyl Glycol (SYSTANE) 0.4-0.3 % SOLN Place 1 drop into both eyes in the morning.     propranolol (INDERAL) 10 MG tablet Take 1 tablet (10 mg total) by mouth every 6 (six) hours as needed (for palpitations).     rivaroxaban (XARELTO) 20 MG TABS tablet TAKE 1 TABLET BY MOUTH DAILY WITH SUPPER (Patient taking differently: Take 20 mg by mouth daily with breakfast.) 90 tablet 3   spironolactone (ALDACTONE) 25 MG tablet TAKE 1 TABLET EVERY DAY 90 tablet 2   No current facility-administered medications  for this encounter.    Allergies  Allergen Reactions   Oxycodone Hives    Hives    Topamax [Topiramate] Other (See Comments)    hallucinations    Codeine Nausea And Vomiting    VERTIGO   Other Other (See Comments)    Adhesive-prolonged exposure causes skin redness/irritation.   Penicillins Rash   Tramadol     dizziness    Social History   Socioeconomic History   Marital status: Married    Spouse name: [name]   Number of children: 0   Years of education: college   Highest education level: Not on file  Occupational History    Comment: retired  Tobacco Use   Smoking status: Never   Smokeless tobacco: Never  Vaping Use   Vaping Use: Never used  Substance and Sexual Activity   Alcohol use: Not Currently   Drug use: No   Sexual activity: Not Currently    Birth control/protection: Post-menopausal  Other Topics Concern   Not on file  Social History Narrative   Patient lives in [place] with her husband [name])  and she is retired. College education.   Caffeine- 2-3 cups tea daily .   Social Determinants of Health   Financial Resource Strain: Not on file  Food Insecurity: Not on file  Transportation Needs: Not on file  Physical Activity: Not on file  Stress: Not on file  Social Connections: Not on file  Intimate Partner Violence: Not on file     ROS- All systems are reviewed and negative except as per the HPI above.  Physical Exam: Vitals:   05/17/22 0948  BP: 134/80  Pulse: (!) 57  Weight: 86.8 kg  Height: 5\' 9"  (1.753 m)    GEN- The patient is a well appearing female, alert and oriented x 3 today.   Head- normocephalic, atraumatic Eyes-  Sclera clear, conjunctiva pink Ears- hearing intact Oropharynx- clear Neck- supple  Lungs- Clear to ausculation bilaterally, normal work of breathing Heart- Bradycardic regular rate and rhythm, no murmurs, rubs or gallops  GI- soft, NT, ND, + BS Extremities- no clubbing, cyanosis, or edema. Left shoulder and left  bicep tender to palpation. MS- no significant deformity or atrophy Skin- no rash or lesion Psych- euthymic mood, full affect Neuro- strength and sensation are intact. Bilateral hand squeeze equal and normal strength. Tremor noted.  Wt Readings from Last 3 Encounters:  05/17/22 86.8 kg  05/06/22 87.1 kg  05/01/22 89 kg    EKG today demonstrates  Vent. rate 57 BPM PR interval 138 ms QRS duration 74 ms QT/QTcB 392/381 ms P-R-T axes 58 63 59 Sinus bradycardia Low voltage QRS Borderline ECG When compared with ECG of 06-May-2022 10:47, PREVIOUS ECG IS PRESENT  Echo 04/17/21 demonstrated:  1. Left ventricular ejection fraction, by estimation, is 55 to 60%. The  left ventricle has normal function. Left ventricular endocardial border  not optimally defined to evaluate regional wall motion. Left ventricular  diastolic function could not be  evaluated.   2. Right ventricular systolic function is normal. The right ventricular  size is mildly enlarged. There is normal pulmonary artery systolic  pressure. The estimated right ventricular systolic pressure is 25.1 mmHg.   3. The mitral valve is grossly normal. No evidence of mitral valve  regurgitation. No evidence of mitral stenosis.   4. The aortic valve is tricuspid. Aortic valve regurgitation is not  visualized. No aortic stenosis is present.   5. The inferior vena cava is normal in size with greater than 50%  respiratory variability, suggesting right atrial pressure of 3 mmHg.  Epic records are reviewed at length today.  CHA2DS2-VASc Score = 3  [name] score is based upon: CHF History: 1 HTN History: 0 Diabetes History: 0 Stroke History: 0 Vascular Disease History: 0 Age Score: 1 Gender Score: 1       ASSESSMENT AND PLAN: Persistent Atrial Fibrillation (ICD10:  I48.0) / Atrial flutter [name] CHA2DS2-VASc score is 3, indicating a 3.2% annual risk of stroke.    She is in SR today.  S/p Afib and atrial flutter  ablation on 05/06/22.   No signs of heart failure, pericarditis or chest pain today. Left shoulder blade and arm tender to palpation.  Recommended 2-3 days of Advil and then stop advil.   2.  Secondary Hypercoagulable State (ICD10:  D68.69) The patient is at significant risk for stroke/thromboembolism based upon her CHA2DS2-VASc Score of 3.  Continue Rivaroxaban (Xarelto).  No missed doses; emphasized compliance  3. Obesity Body mass index is 28.26 kg/m. Lifestyle modification was discussed at length including regular exercise and weight reduction. Activity as tolerated.  4. Obstructive sleep apnea The importance of adequate treatment of sleep apnea was discussed today in order to improve our ability to maintain sinus rhythm long term. Compliant with CPAP.   Follow up as scheduled Afib clinic.   Lake Bells, PA-C Afib Clinic [facility] [address] [place], [place] [address] [phone] 05/17/2022 10:36 AM

## 2022-05-22 DIAGNOSIS — H25813 Combined forms of age-related cataract, bilateral: Secondary | ICD-10-CM | POA: Diagnosis not present

## 2022-05-22 DIAGNOSIS — H353111 Nonexudative age-related macular degeneration, right eye, early dry stage: Secondary | ICD-10-CM | POA: Diagnosis not present

## 2022-05-22 DIAGNOSIS — H35363 Drusen (degenerative) of macula, bilateral: Secondary | ICD-10-CM | POA: Diagnosis not present

## 2022-05-22 DIAGNOSIS — H3122 Choroidal dystrophy (central areolar) (generalized) (peripapillary): Secondary | ICD-10-CM | POA: Diagnosis not present

## 2022-05-22 DIAGNOSIS — H35453 Secondary pigmentary degeneration, bilateral: Secondary | ICD-10-CM | POA: Diagnosis not present

## 2022-05-22 DIAGNOSIS — H35373 Puckering of macula, bilateral: Secondary | ICD-10-CM | POA: Diagnosis not present

## 2022-05-22 DIAGNOSIS — H353122 Nonexudative age-related macular degeneration, left eye, intermediate dry stage: Secondary | ICD-10-CM | POA: Diagnosis not present

## 2022-05-24 ENCOUNTER — Telehealth (HOSPITAL_BASED_OUTPATIENT_CLINIC_OR_DEPARTMENT_OTHER): Payer: Self-pay | Admitting: Pulmonary Disease

## 2022-05-24 NOTE — Telephone Encounter (Signed)
Patients husband called said [facility] has not contacted him about patients CPAP. Feels the ball is being dropped and would like the order to be sent again. Stated that it has already been sent and we have not heard from [place], also offered to give him the number we have on file but said that was not necessary. Please advise.

## 2022-05-24 NOTE — Telephone Encounter (Signed)
Message sent to [facility] as a urgent message

## 2022-05-26 LAB — CUP PACEART REMOTE DEVICE CHECK
Date Time Interrogation Session: 20240503230354
Implantable Pulse Generator Implant Date: 20230524

## 2022-05-27 ENCOUNTER — Ambulatory Visit (INDEPENDENT_AMBULATORY_CARE_PROVIDER_SITE_OTHER): Payer: Medicare PPO

## 2022-05-27 DIAGNOSIS — I4819 Other persistent atrial fibrillation: Secondary | ICD-10-CM | POA: Diagnosis not present

## 2022-05-28 NOTE — Progress Notes (Signed)
Carelink Summary Report / Loop Recorder 

## 2022-05-29 NOTE — Telephone Encounter (Signed)
[name], [name]  [name], [name]; Stocks, Hume; Delia Chimes L This patient was set up with her machine on 02/08/2021

## 2022-05-29 NOTE — Telephone Encounter (Signed)
Order placed on 05/01/22 for Please provide patient with philips [employer]Wear full facemask. I have sent another message to [employer] to check on this issue

## 2022-05-31 NOTE — Telephone Encounter (Signed)
Rec'd confirmation from Terri C with [employer] that she has this order on 05/30/22

## 2022-06-03 ENCOUNTER — Encounter (HOSPITAL_BASED_OUTPATIENT_CLINIC_OR_DEPARTMENT_OTHER): Payer: Self-pay | Admitting: Obstetrics & Gynecology

## 2022-06-03 ENCOUNTER — Other Ambulatory Visit (HOSPITAL_COMMUNITY)
Admission: RE | Admit: 2022-06-03 | Discharge: 2022-06-03 | Disposition: A | Discharge status: Home / Self Care | Payer: Medicare PPO | Source: Ambulatory Visit

## 2022-06-03 ENCOUNTER — Ambulatory Visit (HOSPITAL_COMMUNITY)
Admission: RE | Admit: 2022-06-03 | Discharge: 2022-06-03 | Disposition: A | Discharge status: Home / Self Care | Payer: Medicare PPO | Source: Ambulatory Visit | Attending: Physician Assistant | Admitting: Physician Assistant

## 2022-06-03 ENCOUNTER — Ambulatory Visit (INDEPENDENT_AMBULATORY_CARE_PROVIDER_SITE_OTHER): Payer: Medicare PPO | Admitting: Obstetrics & Gynecology

## 2022-06-03 VITALS — BP 122/71 | HR 59 | Ht 68.75 in | Wt 195.0 lb

## 2022-06-03 VITALS — BP 132/80 | HR 55 | Ht 68.75 in | Wt 196.0 lb

## 2022-06-03 DIAGNOSIS — I4892 Unspecified atrial flutter: Secondary | ICD-10-CM | POA: Diagnosis not present

## 2022-06-03 DIAGNOSIS — G4733 Obstructive sleep apnea (adult) (pediatric): Secondary | ICD-10-CM | POA: Diagnosis not present

## 2022-06-03 DIAGNOSIS — Z7901 Long term (current) use of anticoagulants: Secondary | ICD-10-CM | POA: Diagnosis not present

## 2022-06-03 DIAGNOSIS — Z780 Asymptomatic menopausal state: Secondary | ICD-10-CM

## 2022-06-03 DIAGNOSIS — D6869 Other thrombophilia: Secondary | ICD-10-CM | POA: Diagnosis not present

## 2022-06-03 DIAGNOSIS — Z124 Encounter for screening for malignant neoplasm of cervix: Secondary | ICD-10-CM

## 2022-06-03 DIAGNOSIS — I110 Hypertensive heart disease with heart failure: Secondary | ICD-10-CM | POA: Insufficient documentation

## 2022-06-03 DIAGNOSIS — I4819 Other persistent atrial fibrillation: Secondary | ICD-10-CM | POA: Diagnosis not present

## 2022-06-03 DIAGNOSIS — K429 Umbilical hernia without obstruction or gangrene: Secondary | ICD-10-CM | POA: Diagnosis not present

## 2022-06-03 DIAGNOSIS — Z5181 Encounter for therapeutic drug level monitoring: Secondary | ICD-10-CM

## 2022-06-03 DIAGNOSIS — Z01419 Encounter for gynecological examination (general) (routine) without abnormal findings: Secondary | ICD-10-CM | POA: Diagnosis not present

## 2022-06-03 DIAGNOSIS — I5032 Chronic diastolic (congestive) heart failure: Secondary | ICD-10-CM | POA: Insufficient documentation

## 2022-06-03 DIAGNOSIS — Z79899 Other long term (current) drug therapy: Secondary | ICD-10-CM

## 2022-06-03 DIAGNOSIS — R87610 Atypical squamous cells of undetermined significance on cytologic smear of cervix (ASC-US): Secondary | ICD-10-CM | POA: Insufficient documentation

## 2022-06-03 LAB — MAGNESIUM: Magnesium: 2 mg/dL (ref 1.7–2.4)

## 2022-06-03 NOTE — Progress Notes (Addendum)
72 y.o. G1P0 Married White or Caucasian female here for breast and pelvic exam.  Denies vaginal bleeding.    Had cardiac ablation 05/06/2022.  On Xarelto.  Lots of bruising present.    Has had multiple tick bites this spring.    Has some dark vulvar lesions she'd like me to check today.  Vaccines reviewed.  Has updated pneumonia vaccine with prevnar 20 03/07/2022.  Has done zostavax but not shingrix.  She declines.    Patient's last menstrual period was 01/21/1990.          Sexually active: No.  The current method of family planning is post menopausal status.    Smoker:  no  Health Maintenance: Pap:  05/31/20 History of abnormal Pap:  no MMG:  07/31/21 Colonoscopy:  last cologuard 04/2020.  Dr. Timothy Lasso will order.   BMD:   does with Timothy Lasso Screening Labs: Does with Dr. Timothy Lasso and has upcoming appt in the next two months.   reports that she has never smoked. She has never used smokeless tobacco. She reports that she does not currently use alcohol. She reports that she does not use drugs.  Past Medical History:  Diagnosis Date   Atrial fibrillation (HCC)    Back pain    nerve problems-had series of three injections   Bone spur    Spine   Chronic anticoagulation    Diastolic congestive heart failure (HCC)    Hearing loss    History of shingles    Hyperlipemia    Migraine headache    Obstructive sleep apnea    uses CPAP   OSA on CPAP    Paroxysmal atrial fibrillation (HCC)    Restrictive cardiomyopathy (HCC)    Tremor    Tubal pregnancy     Past Surgical History:  Procedure Laterality Date   A-FLUTTER ABLATION N/A 05/06/2022   Procedure: A-FLUTTER ABLATION;  Surgeon: Mealor, Roberts Gaudy, MD;  [facility]: [facility];  Service: Cardiovascular;  Laterality: N/A;   ATRIAL FIBRILLATION ABLATION N/A 05/06/2022   Procedure: ATRIAL FIBRILLATION ABLATION;  Surgeon: Maurice Small, MD;  [facility]: [facility];  Service: Cardiovascular;  Laterality: N/A;   BREAST EXCISIONAL  BIOPSY Left    benign   CARDIAC CATHETERIZATION     CARDIOVERSION     CHOLECYSTECTOMY     ECTOPIC PREGNANCY SURGERY  1980   EYE SURGERY     left eye injections x2-was having "stroke like" flashes of light   heart biopsy  2008   [facility]   HYSTEROSCOPY  06/2003   and D&C-simple hyperplasia secondary PMP bleeding   implantable loop recorder placement  06/13/2021   Medtronic Reveal Linq model LNQ 22 (SN [id number]) implantable loop recorder implanted for syncope and AF management   ROTATOR CUFF REPAIR     bilateral    Current Outpatient Medications  Medication Sig Dispense Refill   atorvastatin (LIPITOR) 20 MG tablet TAKE 1 TABLET BY MOUTH DAILY AT 6PM (Patient taking differently: Take 20 mg by mouth daily with supper.) 90 tablet 2   Cholecalciferol (VITAMIN D-3 PO) Take 5,000 Units by mouth daily with supper.     Cyanocobalamin (VITAMIN B-12) 5000 MCG SUBL Take 5,000 mcg by mouth daily with supper.     dofetilide (TIKOSYN) 125 MCG capsule Take 3 capsules (375 mcg total) by mouth 2 (two) times daily. 540 capsule 3   ezetimibe (ZETIA) 10 MG tablet TAKE 1 TABLET EVERY DAY 90 tablet 3   gabapentin (NEURONTIN) 600 MG tablet  Take 1 tablet (600 mg total) by mouth 3 (three) times daily. 90 tablet 6   Multiple Vitamins-Minerals (PRESERVISION AREDS 2 PO) Take 1 capsule by mouth 2 (two) times daily. With breakfast & with supper     NONFORMULARY OR COMPOUNDED ITEM Place 1 spray into both nostrils 2 (two) times daily as needed (migraine headaches.). Lidocaine Isotonic Nasal Spray 4% Compounded via [facility]     pantoprazole (PROTONIX) 40 MG tablet Take 1 tablet (40 mg total) by mouth daily. 30 tablet 0   Polyethyl Glycol-Propyl Glycol (SYSTANE) 0.4-0.3 % SOLN Place 1 drop into both eyes in the morning.     propranolol (INDERAL) 10 MG tablet Take 1 tablet (10 mg total) by mouth every 6 (six) hours as needed (for palpitations).     rivaroxaban (XARELTO) 20 MG TABS tablet TAKE 1 TABLET BY MOUTH  DAILY WITH SUPPER (Patient taking differently: Take 20 mg by mouth daily with breakfast.) 90 tablet 3   spironolactone (ALDACTONE) 25 MG tablet TAKE 1 TABLET EVERY DAY 90 tablet 2   No current facility-administered medications for this visit.    Family History  Problem Relation Age of Onset   Hypertension Father    Dementia Father    Seizures Father    Atrial fibrillation Mother    Cancer Maternal Grandmother        mouth   Cancer Maternal Uncle        unknown type   Thyroid cancer Sister    Breast cancer Sister        breast cancer   Heart disease Sister    Atrial fibrillation Sister    Dementia Sister     ROS: Constitutional: negative Genitourinary:negative  Exam:   BP 122/71   Pulse (!) 59   Ht 5' 8.75" (1.746 m)   Wt 195 lb (88.5 kg)   LMP 01/21/1990   BMI 29.01 kg/m   Height: 5' 8.75" (174.6 cm)  General appearance: alert, cooperative and appears stated age Head: Normocephalic, without obvious abnormality, atraumatic Neck: no adenopathy, supple, symmetrical, trachea midline and thyroid normal to inspection and palpation Lungs: clear to auscultation bilaterally Breasts: normal appearance, no masses or tenderness Heart: regular rate and rhythm Abdomen: soft, non-tender; bowel sounds normal; no masses,  umbilical hernia about 6cm Extremities: extremities normal, atraumatic, no cyanosis or edema Skin: Skin color, texture, turgor normal. No rashes or lesions Lymph nodes: Cervical, supraclavicular, and axillary nodes normal. No abnormal inguinal nodes palpated Neurologic: Grossly normal   Pelvic: External genitalia:  multiple vulvar hemangiomas noted today on external labia majora              Urethra:  normal appearing urethra with no masses, tenderness or lesions              Bartholins and Skenes: normal                 Vagina: normal appearing vagina with normal color and no discharge, no lesions              Cervix: no lesions              Pap taken: Yes.    Bimanual Exam:  Uterus:  normal size, contour, position, consistency, mobility, non-tender              Adnexa: normal adnexa and no mass, fullness, tenderness               Rectovaginal: Confirms  Anus:  normal sphincter tone, no lesions  Chaperone, Raechel Ache, RN, was present for exam.  Assessment/Plan: 1. Encntr for gyn exam (general) (routine) w/o abn findings - Pap smear obtained today - Mammogram 07/2021 - Colonoscopy declined.  Cologuard 2022.  States Dr. Timothy Lasso will order this year. - Bone mineral density is done in Dr. Ferd Hibbs office - lab work done with PCP, Dr. Timothy Lasso - vaccines reviewed/updated.  Has not completed shingrix  2. Cervical cancer screening - PR OBTAINING SCREEN PAP SMEAR - Cytology - PAP( [place])  3. Persistent atrial fibrillation (HCC) - has follow up with Dr. Morrie Sheldon team today - will see Dr. Melburn Popper in June  4. Postmenopausal - on Vit D.  Dosing discussed. - BMD done with Dr. Timothy Lasso.  I did review prior notes from this year and cannot find exact date.  5. Umbilical hernia without obstruction and without gangrene - stable on exam

## 2022-06-03 NOTE — Progress Notes (Addendum)
Primary Care Physician: Creola Corn, MD Primary Cardiologist: Dr. Elease Hashimoto Primary Electrophysiologist: Dr. Nelly Laurence Referring Physician: Dr. Alen Blew [name] is a 72 y.o. female with a history of OSA on CPAP, tremor, HLD, diastolic congestive heart failure, coronary artery calcium score 0 on 02/25/2018, migraines, and atrial fibrillation who presents for consultation in the [facility]. She is s/p Afib and atrial flutter ablation on 05/06/22 by Dr. Nelly Laurence. She is on Tikosyn 375 mcg BID and was having breakthrough episodes of Afib feeling very symptomatic. Patient is on Xarelto 20 mg daily for a CHADS2VASC score of 3.  She is now s/p Afib and atrial flutter ablation on 05/06/22 by Dr. Nelly Laurence. Patient is in SR today. Her arm/shoulder discomfort post ablation has greatly improved. She denies groin pain or swallowing issues. No significant bleeding issues on anticoagulation.   Today, she denies symptoms of palpitations, chest pain, orthopnea, PND, lower extremity edema, dizziness, presyncope, syncope, daytime somnolence, bleeding, or neurologic sequela. The patient is tolerating medications without difficulties and is otherwise without complaint today.   Atrial Fibrillation Risk Factors:  she does have symptoms or diagnosis of sleep apnea. she is compliant with CPAP therapy. she does not have a history of rheumatic fever. she does not have a history of alcohol use. The patient does not have a history of early familial atrial fibrillation or other arrhythmias.  she has a BMI of Body mass index is 29.16 kg/m.[name] Filed Weights   06/03/22 1110  Weight: 88.9 kg    Family History  Problem Relation Age of Onset   Hypertension Father    Dementia Father    Seizures Father    Atrial fibrillation Mother    Cancer Maternal Grandmother        mouth   Cancer Maternal Uncle        unknown type   Thyroid cancer Sister    Breast cancer Sister        breast cancer    Heart disease Sister    Atrial fibrillation Sister    Dementia Sister      Atrial Fibrillation Management history:  Previous antiarrhythmic drugs: Tikosyn Previous cardioversions: remotely  Previous ablations: 05/06/22 Anticoagulation history: Xarelto 20 mg daily   Past Medical History:  Diagnosis Date   Atrial fibrillation (HCC)    Back pain    nerve problems-had series of three injections   Bone spur    Spine   Chronic anticoagulation    Diastolic congestive heart failure (HCC)    Hearing loss    History of shingles    Hyperlipemia    Migraine headache    Obstructive sleep apnea    uses CPAP   OSA on CPAP    Paroxysmal atrial fibrillation (HCC)    Restrictive cardiomyopathy (HCC)    Tremor    Tubal pregnancy    Past Surgical History:  Procedure Laterality Date   A-FLUTTER ABLATION N/A 05/06/2022   Procedure: A-FLUTTER ABLATION;  Surgeon: Mealor, Roberts Gaudy, MD;  Location: [facility];  Service: Cardiovascular;  Laterality: N/A;   ATRIAL FIBRILLATION ABLATION N/A 05/06/2022   Procedure: ATRIAL FIBRILLATION ABLATION;  Surgeon: Maurice Small, MD;  Location: [facility];  Service: Cardiovascular;  Laterality: N/A;   BREAST EXCISIONAL BIOPSY Left    benign   CARDIAC CATHETERIZATION     CARDIOVERSION     CHOLECYSTECTOMY     ECTOPIC PREGNANCY SURGERY  1980   EYE SURGERY  left eye injections x2-was having "stroke like" flashes of light   heart biopsy  2008   [facility]   HYSTEROSCOPY  06/2003   and D&C-simple hyperplasia secondary PMP bleeding   implantable loop recorder placement  06/13/2021   Medtronic Reveal Linq model LNQ 22 (SN [id number]) implantable loop recorder implanted for syncope and AF management   ROTATOR CUFF REPAIR     bilateral    Current Outpatient Medications  Medication Sig Dispense Refill   atorvastatin (LIPITOR) 20 MG tablet TAKE 1 TABLET BY MOUTH DAILY AT 6PM (Patient taking differently: Take 20 mg by mouth daily with  supper.) 90 tablet 2   Cholecalciferol (VITAMIN D-3 PO) Take 5,000 Units by mouth daily with supper.     Cyanocobalamin (VITAMIN B-12) 5000 MCG SUBL Take 5,000 mcg by mouth daily with supper.     dofetilide (TIKOSYN) 125 MCG capsule Take 3 capsules (375 mcg total) by mouth 2 (two) times daily. 540 capsule 3   ezetimibe (ZETIA) 10 MG tablet TAKE 1 TABLET EVERY DAY 90 tablet 3   gabapentin (NEURONTIN) 600 MG tablet Take 1 tablet (600 mg total) by mouth 3 (three) times daily. 90 tablet 6   Multiple Vitamins-Minerals (PRESERVISION AREDS 2 PO) Take 1 capsule by mouth 2 (two) times daily. With breakfast & with supper     NONFORMULARY OR COMPOUNDED ITEM Place 1 spray into both nostrils 2 (two) times daily as needed (migraine headaches.). Lidocaine Isotonic Nasal Spray 4% Compounded via [facility]     pantoprazole (PROTONIX) 40 MG tablet Take 1 tablet (40 mg total) by mouth daily. 30 tablet 0   Polyethyl Glycol-Propyl Glycol (SYSTANE) 0.4-0.3 % SOLN Place 1 drop into both eyes in the morning.     propranolol (INDERAL) 10 MG tablet Take 1 tablet (10 mg total) by mouth every 6 (six) hours as needed (for palpitations).     rivaroxaban (XARELTO) 20 MG TABS tablet TAKE 1 TABLET BY MOUTH DAILY WITH SUPPER (Patient taking differently: Take 20 mg by mouth daily with breakfast.) 90 tablet 3   spironolactone (ALDACTONE) 25 MG tablet TAKE 1 TABLET EVERY DAY 90 tablet 2   No current facility-administered medications for this encounter.    Allergies  Allergen Reactions   Oxycodone Hives    Hives    Topamax [Topiramate] Other (See Comments)    hallucinations    Codeine Nausea And Vomiting    VERTIGO   Other Other (See Comments)    Adhesive-prolonged exposure causes skin redness/irritation.   Penicillins Rash   Tramadol     dizziness    Social History   Socioeconomic History   Marital status: Married    Spouse name: [name]   Number of children: 0   Years of education: college   Highest  education level: Not on file  Occupational History    Comment: retired  Tobacco Use   Smoking status: Never   Smokeless tobacco: Never  Vaping Use   Vaping Use: Never used  Substance and Sexual Activity   Alcohol use: Not Currently   Drug use: No   Sexual activity: Not Currently    Birth control/protection: Post-menopausal  Other Topics Concern   Not on file  Social History Narrative   Patient lives in [place] with her husband [name])  and she is retired. College education.   Caffeine- 2-3 cups tea daily .   Social Determinants of Health   Financial Resource Strain: Not on file  Food Insecurity: Not on file  Transportation  Needs: Not on file  Physical Activity: Not on file  Stress: Not on file  Social Connections: Not on file  Intimate Partner Violence: Not on file     ROS- All systems are reviewed and negative except as per the HPI above.  Physical Exam: Vitals:   06/03/22 1110  BP: 132/80  Pulse: (!) 55  Weight: 88.9 kg  Height: 5' 8.75" (1.746 m)    GEN- The patient is a well appearing female, alert and oriented x 3 today.   HEENT-head normocephalic, atraumatic, sclera clear, conjunctiva pink, hearing intact, trachea midline. Lungs- Clear to ausculation bilaterally, normal work of breathing Heart- Regular rate and rhythm, no murmurs, rubs or gallops  GI- soft, NT, ND, + BS Extremities- no clubbing, cyanosis, or edema MS- no significant deformity or atrophy Skin- no rash or lesion Psych- euthymic mood, full affect Neuro- strength and sensation are intact   Wt Readings from Last 3 Encounters:  06/03/22 88.9 kg  06/03/22 88.5 kg  05/17/22 86.8 kg    EKG today demonstrates  SB Vent. rate 55 BPM PR interval 130 ms QRS duration 72 ms QT/QTcB 432/413 ms  Echo 04/17/21 demonstrated:  1. Left ventricular ejection fraction, by estimation, is 55 to 60%. The  left ventricle has normal function. Left ventricular endocardial border  not optimally defined to  evaluate regional wall motion. Left ventricular  diastolic function could not be evaluated.   2. Right ventricular systolic function is normal. The right ventricular  size is mildly enlarged. There is normal pulmonary artery systolic  pressure. The estimated right ventricular systolic pressure is 25.1 mmHg.   3. The mitral valve is grossly normal. No evidence of mitral valve  regurgitation. No evidence of mitral stenosis.   4. The aortic valve is tricuspid. Aortic valve regurgitation is not  visualized. No aortic stenosis is present.   5. The inferior vena cava is normal in size with greater than 50%  respiratory variability, suggesting right atrial pressure of 3 mmHg.  Epic records are reviewed at length today.  CHA2DS2-VASc Score = 3  [name] score is based upon: CHF History: 1 HTN History: 0 Diabetes History: 0 Stroke History: 0 Vascular Disease History: 0 Age Score: 1 Gender Score: 1       ASSESSMENT AND PLAN: 1. Persistent Atrial Fibrillation/Atrial flutter [name] CHA2DS2-VASc score is 3, indicating a 3.2% annual risk of stroke.   S/p Afib and atrial flutter ablation on 05/06/22.  Patient appears to be maintaining SR.  Continue dofetilide 375 mcg BID (125 mg x 3). QT stable.  Continue Xarelto 20 mg daily with no missed doses for 3 months post ablation.  Continue propranolol 10 mg q 6 hours for palpitations.  2. Secondary Hypercoagulable State (ICD10:  D68.69) The patient is at significant risk for stroke/thromboembolism based upon her CHA2DS2-VASc Score of 3.  Continue Rivaroxaban (Xarelto).   3. Obstructive sleep apnea Encouraged compliance with CPAP therapy.  4. Chronic diastolic CHF Fluid status appears stable.   Follow up with Dr Nelly Laurence and Dr Elease Hashimoto as scheduled.    Jorja Loa PA-C [facility] [address] [place], [place] [address] [phone] 06/03/2022 11:26 AM

## 2022-06-06 LAB — CYTOLOGY - PAP
Comment: NEGATIVE
Diagnosis: UNDETERMINED — AB
High risk HPV: NEGATIVE

## 2022-06-24 DIAGNOSIS — M461 Sacroiliitis, not elsewhere classified: Secondary | ICD-10-CM | POA: Diagnosis not present

## 2022-06-24 NOTE — Progress Notes (Signed)
Carelink Summary Report / Loop Recorder 

## 2022-07-01 ENCOUNTER — Ambulatory Visit (INDEPENDENT_AMBULATORY_CARE_PROVIDER_SITE_OTHER): Payer: Medicare PPO

## 2022-07-01 DIAGNOSIS — I4819 Other persistent atrial fibrillation: Secondary | ICD-10-CM

## 2022-07-01 LAB — CUP PACEART REMOTE DEVICE CHECK
Date Time Interrogation Session: 20240609230524
Implantable Pulse Generator Implant Date: 20230524

## 2022-07-04 ENCOUNTER — Other Ambulatory Visit: Payer: Self-pay | Admitting: Cardiovascular Disease

## 2022-07-05 ENCOUNTER — Telehealth: Payer: Self-pay | Admitting: Cardiovascular Disease

## 2022-07-05 ENCOUNTER — Encounter: Payer: Self-pay | Admitting: Cardiovascular Disease

## 2022-07-05 NOTE — Telephone Encounter (Signed)
Per Jorja Loa PA recommend continue use of prn propranolol for elevated rates. Pt is back in normal rhythm but heart rates have been slow after taking propranolol this time - HR 47-52 which she feels is making her feel poorly. Pt will try just 1/2 of propranolol should afib return and see if she will be less symptomatic when taking propranolol. Pt offered appt next week but pt states she has an appt with her general cardiologist so will follow up with him. Afib clinic if needed.

## 2022-07-05 NOTE — Telephone Encounter (Signed)
Pt seen in A-fib clinic on 06/03/22 by Fenton:  She is s/p Afib and atrial flutter ablation on 05/06/22 by Dr. Nelly Laurence. She is on Tikosyn 375 mcg BID and was having breakthrough episodes of Afib feeling very symptomatic. Patient is on Xarelto 20 mg daily for a CHADS2VASC score of 3. She is now s/p Afib and atrial flutter ablation on 05/06/22 by Dr. Nelly Laurence. Patient is in SR today. Her arm/shoulder discomfort post ablation has greatly improved.  ASSESSMENT AND PLAN: 1. Persistent Atrial Fibrillation/Atrial flutter [name] CHA2DS2-VASc score is 3, indicating a 3.2% annual risk of stroke.   S/p Afib and atrial flutter ablation on 05/06/22.  Patient appears to be maintaining SR.  Continue dofetilide 375 mcg BID (125 mg x 3). QT stable.  Continue Xarelto 20 mg daily with no missed doses for 3 months post ablation.  Continue propranolol 10 mg q 6 hours for palpitations.  Returned call to [name] who states that she awoke about 5:30am this morning and was in A-fib with rate of 102bpm. She took a propranolol 10mg  at that time. She denies symptoms of dizziness, chest pain, vision changes,  or shortness of breath, just feels weak. Says that she's not felt this way before when she's had breakthrough episodes, or when she's used propranolol. Currently her HR via apple watch is 47bpm (states baseline is mid-50's). BP at time of call 100/60. Did verify patient has eaten breakfast (egg, muffin, and OJ) and is not diabetic. Will route to device clinic to check loop recorder and to the A-fib clinic for advisement.

## 2022-07-05 NOTE — Progress Notes (Unsigned)
could go okay  Cardiology Office Note   Date:  07/09/2022   ID:  [name], DOB 22-Jan-1950, MRN [id number]  PCP:  Creola Corn, MD  Cardiologist:   Kristeen Miss, MD   Chief Complaint  Patient presents with   Atrial Fibrillation   1. Atrial fibrillation 2. Hyperlipidemia 3. Diastolic congestive heart failure  72 year old female with a history of atrial fibrillation. She's been well controlled on Tikosyn. She also has a history of hyperlipidemia and is on Lipitor. She's been on chronic Pradaxa therapy for anticoagulation.  February 20, 2012 [name]  has done well since I last saw her. 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 She has occasional palpitations but overall she's doing quite well.  She is exercising ( water aerobics) twice a week.  Feb. 24, 2015:  [name] is doing well.   No problems.  Maintaining NSR.  Sept. 16, 2015:  She was in the ER this past week.  CP, dyspnea .. CT angio was negative for PE - showed ? Of pneumonia.   Was treated wit Abx. Does well in the am's .[name] and perhaps more short of breath in the afternoons.   April 05, 2014: [name] is a 72 y.o. female who presents for follow-up of her atrial fibrillation. She has continued to have some DOE,  Is concerned about the atorvastatin. Was seen in the lipid clinic.    Sept. 30, 2016:  Doing well.  No CP , no dyspnea. Has lost 11 lbs since last year.  Is in NSR  April 10, 2015:  [name] is doing ok from a cardiology standpoint. Has had some dental issues   Sept.  22, 2017:  [name] is seen for follow up of her atrial fib.    No  CP or dyspnea. No further episodes of atrial fib.   Dec. 11, 2017:  [name] is seen today for followup for her atrial fib  Has some DOE  Not exercising as much as she needs to  No CP   Jan. 16, 2018: [name] is seen today as a work in visit at the request of Dr. Timothy Lasso. Been having more shortness of breath and was  thought to have worsening congestive heart failure.  Has severe DOE with any exertion. We performed a cardiac cath in 2008 which revealed normal coronaries.   She went to [facility] and had a heart biopsy.   Was not told of any abnormality.  Does have dull chest pain with exertion and with sitting   She had an echocardiogram performed yesterday which revealed normal left ventricular systolic function. She does have grade 2 diastolic dysfunction. Her Lasix was increased last week.   She has not noticed any increase in urine output.  No change in her breathing .  Has noticed that she loses her breath if she does any exertion .  Cannot lay supine - has severe dyspnea.   April 02, 2016:  [name] is seen today for follow up visit .    Seen with husband , [name]  She had an episode of PAF yesterday  - lasted for several hours.   Is back in NSR currently .  Had an episode in Feb. That lasted 2-3 hours , HR of 129 ,  She was very short of breath with that.   -Sleep study on Feb. 20 shows mild OSA -PFTs 01/2016 showed ratio 72, FVC 74%, FEV1 69%, TLC 91% and DLCO 73% suggesting moderate  intraparenchymal restriction  - echo shows normal LV systolic function with grade 2 diastolic dysfunction. She has an estimated PA pressure of 35.  Has developed a tremor.   Is seeing a neurologist .   July 10, 2016:  Had a CPX in March , Normal functional capacity compared to sedentary patients.  Still has some vocal issues ( when she gets fatigued, she loses her voice) struggles to talk  Able to get out and work in the garden Using CPAP - may have helped some   Dec. 19, 2018:  [name] is seen today for follow up of her chronic diastolic CHF [name] was [facility] earlier this year.  She had a heart catheterization which revealed normal coronary arteries.  She has had a myocardial biopsy which was reportedly normal. History of paroxysmal atrial fibrillation which seems to be  well-controlled on  Tikosyn  PFTs has shown reduction of DLCO ( 73% pred.)   Is just getting over al cold  Has continued to have issues with vertigo  Not getting out to do any exercise   July 23, 2017:  Doing well,  No CP or dyspnea. Gets short of breath with the heat.  Started getting some exercise, yoga .  No cardiac exercise  Is having some joint issues,     February 02, 2018: He is seen today for follow-up visit.  She has a history of atrial fibrillation. She remains in normal sinus rhythm on Tikosyn.   She had an episode of paroxysmal atrial fibrillation this past Thursday.  The episode lasted for about 3 hours.   She took 1 of [name]'s propranolol tablets and went back into sinus rhythm fairly quickly.  Her QT intervals have remained good.  Sept. 15, 2020   [name] is seen today . caregiving for [name]'s sister.  Wt. Today is  222 ( previous wt from Jan. 2020 was 241 lbs)   Walking some , eating fewer carbs.   April 13, 2019  [name] is seen today for follow up of her atrial fib and chronic diastilic CHF Wt is 225 lbs.  ( up 3 lbs )  Has chronic diastolic CHF Had a spinal injection last week..   Still has back and leg pain with walking  Has not been able to exercise.     Nov. 16, 2021 [name] is seen today for follow up of her chronic CHF, PAF  Wt today is 225 lbs.   Had an episode of PAF several weeks ago.   Lasted 2 days HR was 130s  Made her fatigued.  Has been walking ,  Had lost some weight, now has gained it back  Her Afib started while she was walking up her driveway  Is on xarelto   August 03, 2020: [name] is seen today for follow-up of her chronic congestive heart failure, paroxysmal atrial fibrillation.  Her weight today is211 lbs.   She was seen by [name], NP for worsening shortness of breath.  She has been on Tikosyn for her atrial fibrillation. Her Lasix was increased to 80 mg a day for 3 days then she resumed 40 mg p.o. twice daily.  She was last  seen in A. fib clinic in March, 2022.  She had been maintaining sinus rhythm at that time.  Is not able to do much due to severe DOE . Loses her breath with any activity .  Does an activity , then has to sit down Does another activity ,  then has to sit down  Walks the dog for a few minutes - perhaps 30 mintues.  Had CPX in 2018 ;-   Normal coronary CTA in Feb. 2020 Coronary calcium score is 0   She had a cardiopulmonary stress test in 2018.  It revealed normal functional capacity.  She also had pulmonary function test around 2018.  It revealed mildly reduced diffusion capacity  Voice is normal when she is rested,  has a very difficult time speaking when she is fatigued.  Normal TSH in June, 2022  Echo Jan. 2022:   EF 60-65%.,  Grade 2 diastolic dysfunction. Mild mitral regurgitation.  Saw [name] , NP on last June.  Trial of acid blockers to see if that would help with her hoarse voice No benefit according to [facility]  April 02, 2021 [name] is seen today for follow up of her chronic diastolic CHF Has mild MR She has a hx of episodic dyspnea and loses her voice regularly  I have encouraged her to discuss this with Dr Timothy Lasso and a neurologist  Had a sleep study.  Now has a different CPAP mask ( sees Dr. Vassie Loll )  [facility] is 201 lbs .   April 24, 2021 [name] is seen today for follow up of her chronic diastolic CHF, mild MR She was admitted to the hospital on April 16, 2021 with syncope - occurred at 10 AM .   She had developed atrial fib 2 days prior to her admission.   She had taken 2 propranolol prior to passing out  The syncope was likely orthostatic hypotension by hx . She had just been to the bathroom, stood up , took several steps  She had eaten breakfast, coffee, juice.  Takes propranolol 10 mg BID ( breakfast and dinner ) for her tremor.   Has had 35 lb weight loss She was out for 45-60 seconds Was in atrial fib.    Was scheduled for TEE /cv but she converted on her  own  She is scheduled to see [place] for consideration of implantable loop recorder   Wt is 200 lbs .  She has lost 35 lbs unintentionally . She has discussed with dr. Timothy Lasso.  Memory is not very good Having some vertigo issues,  hearing has been an issue  Has seen ENT .  Has a brain MRI in several weeks .   She is bradycardic today.  My plan is to change her propranolol to just as needed instead of 10 mg twice a day.  Her blood pressure is also on the lower end of normal and she is having orthostatic hypotension.  We will discontinue Lasix and potassium.  We will continue spironolactone.  Jan 07, 2022  [name] is seen today for follow up of her atrial , chronici diastolic CHF,  syncope   Generalized weakness and shortness of breath   Has had 2 episodes of PAF  Lasting from 1 hour to several hours  HR was not as high as previous episodes  Is associated with more dyspnea.  And more fatigue   Lipid levels from Dr. Ferd Hibbs office were reviewed   Jan. 27, 2023 Chol = 135 HDL = 54 Trigs = 61 LDL = 68   July 09, 2022 [name] is seen for follow up of her chronic diastolic CHF,  PAF  Has generalized weakness and shortness of breath  Had ablation on April 15 Has had 2 episodes of PAF  Takes propranolol PRN,   On June  14, she had an episode of PAF ,  took propranolol  HR went down to 40s for most of the day   Will interrogate her loop recorder   Her HR after conversion appeared to be 40-50. Her apple watch suggested prolonged HR in the 40s Implantable look suggest HR was higher   Increased the detection HR limit from 30 to 40 .   She has an appt to see Dr. Lia Foyer next months  Wt is 187 ( down 8 lbs since Dec. 2023 )        Wt Readings from Last 3 Encounters:  07/09/22 187 lb (84.8 kg)  06/03/22 196 lb (88.9 kg)  06/03/22 195 lb (88.5 kg)      Past Medical History:  Diagnosis Date   Atrial fibrillation (HCC)    Back pain    nerve problems-had series of three  injections   Bone spur    Spine   Chronic anticoagulation    Diastolic congestive heart failure (HCC)    Hearing loss    History of shingles    Hyperlipemia    Migraine headache    Obstructive sleep apnea    uses CPAP   OSA on CPAP    Paroxysmal atrial fibrillation (HCC)    Restrictive cardiomyopathy (HCC)    Tremor    Tubal pregnancy     Past Surgical History:  Procedure Laterality Date   A-FLUTTER ABLATION N/A 05/06/2022   Procedure: A-FLUTTER ABLATION;  Surgeon: Mealor, Roberts Gaudy, MD;  Location: [facility] INVASIVE CV LAB;  Service: Cardiovascular;  Laterality: N/A;   ATRIAL FIBRILLATION ABLATION N/A 05/06/2022   Procedure: ATRIAL FIBRILLATION ABLATION;  Surgeon: Maurice Small, MD;  Location: [facility] INVASIVE CV LAB;  Service: Cardiovascular;  Laterality: N/A;   BREAST EXCISIONAL BIOPSY Left    benign   CARDIAC CATHETERIZATION     CARDIOVERSION     CHOLECYSTECTOMY     ECTOPIC PREGNANCY SURGERY  1980   EYE SURGERY     left eye injections x2-was having "stroke like" flashes of light   heart biopsy  2008   [facility]   HYSTEROSCOPY  06/2003   and D&C-simple hyperplasia secondary PMP bleeding   implantable loop recorder placement  06/13/2021   Medtronic Reveal Linq model LNQ 22 (SN [id number]) implantable loop recorder implanted for syncope and AF management   ROTATOR CUFF REPAIR     bilateral     Current Outpatient Medications  Medication Sig Dispense Refill   atorvastatin (LIPITOR) 20 MG tablet TAKE 1 TABLET BY MOUTH DAILY AT 6PM (Patient taking differently: Take 20 mg by mouth daily with supper.) 90 tablet 2   Cholecalciferol (VITAMIN D-3 PO) Take 5,000 Units by mouth daily with supper.     Cyanocobalamin (VITAMIN B-12) 5000 MCG SUBL Take 5,000 mcg by mouth daily with supper.     dofetilide (TIKOSYN) 125 MCG capsule Take 3 capsules (375 mcg total) by mouth 2 (two) times daily. 540 capsule 3   ezetimibe (ZETIA) 10 MG tablet TAKE 1 TABLET EVERY DAY 30 tablet 0   gabapentin  (NEURONTIN) 600 MG tablet Take 1 tablet (600 mg total) by mouth 3 (three) times daily. 90 tablet 6   Multiple Vitamins-Minerals (PRESERVISION AREDS 2 PO) Take 1 capsule by mouth 2 (two) times daily. With breakfast & with supper     NONFORMULARY OR COMPOUNDED ITEM Place 1 spray into both nostrils 2 (two) times daily as needed (migraine headaches.). Lidocaine Isotonic Nasal Spray 4% Compounded via [facility]  Polyethyl Glycol-Propyl Glycol (SYSTANE) 0.4-0.3 % SOLN Place 1 drop into both eyes in the morning.     propranolol (INDERAL) 10 MG tablet Take 1 tablet (10 mg total) by mouth every 6 (six) hours as needed (for palpitations).     rivaroxaban (XARELTO) 20 MG TABS tablet TAKE 1 TABLET BY MOUTH DAILY WITH SUPPER (Patient taking differently: Take 20 mg by mouth daily with breakfast.) 90 tablet 3   spironolactone (ALDACTONE) 25 MG tablet TAKE 1 TABLET EVERY DAY 90 tablet 2   pantoprazole (PROTONIX) 40 MG tablet Take 1 tablet (40 mg total) by mouth daily. 30 tablet 0   No current facility-administered medications for this visit.    Allergies:   Oxycodone, Topamax [topiramate], Codeine, Other, Penicillins, and Tramadol    Social History:  The patient  reports that she has never smoked. She has never used smokeless tobacco. She reports that she does not currently use alcohol. She reports that she does not use drugs.   Family History:  [name] family history includes Atrial fibrillation in her mother and sister; Breast cancer in her sister; Cancer in her maternal grandmother and maternal uncle; Dementia in her father and sister; Heart disease in her sister; Hypertension in her father; Seizures in her father; Thyroid cancer in her sister.    ROS:   As noted in current hx. Otherwise negative    Physical Exam: Blood pressure 120/70, pulse (!) 56, height 5\' 9"  (1.753 m), weight 187 lb (84.8 kg), last menstrual period 01/21/1990, SpO2 98 %.       GEN:  Well nourished, well  developed in no acute distress HEENT: Normal NECK: No JVD; No carotid bruits LYMPHATICS: No lymphadenopathy CARDIAC: RRR , no murmurs, rubs, gallops RESPIRATORY:  Clear to auscultation without rales, wheezing or rhonchi  ABDOMEN: Soft, non-tender, non-distended MUSCULOSKELETAL:  No edema; No deformity  SKIN: Warm and dry NEUROLOGIC:  Alert and oriented x 3    EKG:     Recent Labs: 04/23/2022: BUN 13; Creatinine, Ser 0.86; Hemoglobin 13.0; Platelets 223; Potassium 4.3; Sodium 144 06/03/2022: Magnesium 2.0    Lipid Panel    Component Value Date/Time   CHOL 134 07/23/2017 0847   TRIG 153 (H) 07/23/2017 0847   HDL 40 07/23/2017 0847   CHOLHDL 3.4 07/23/2017 0847   CHOLHDL 4 10/22/2013 1501   VLDL 32.6 10/22/2013 1501   LDLCALC 63 07/23/2017 0847      Wt Readings from Last 3 Encounters:  07/09/22 187 lb (84.8 kg)  06/03/22 196 lb (88.9 kg)  06/03/22 195 lb (88.5 kg)      Other studies Reviewed: Additional studies/ records that were reviewed today include: . Review of the above records demonstrates:    ASSESSMENT AND PLAN:  1. Atrial fibrillation -   she had an Afib ablation in April.  Has had several episodes of PAF. The last episode was June 14 which lasted for ~ 3 hours. She took a propranolol .  Her HR fell into the 40's according to her Interrogation of her Implantable loop showed sinus rhythm 50s-60s.  She will discuss these episodes with Dr. Lia Foyer I have reassured her that these did not sound serious at this time  Suggested that she try Propranolol 5 mg ( 1/2 of her 10 mg tablet) if she continues to have prolonged fatigue after taking the propranolol     2. Syncope:       3.  Chest discomfort:  :      4. Diastolic  congestive heart failure -      5.  Mild TR :   6.  Shortness of breath:    stable      Current medicines are reviewed at length with the patient today.  The patient does not have concerns regarding medicines.  The following changes  have been made:  See above.   Labs/ tests ordered today include:   No orders of the defined types were placed in this encounter.    Disposition:       Kristeen Miss, MD  07/09/2022 10:10 AM    [facility] [address] [place], [place], [place]  [address] Phone: [phone]; Fax: [phone]

## 2022-07-05 NOTE — Telephone Encounter (Signed)
Loop report from 07/05/2022-

## 2022-07-05 NOTE — Telephone Encounter (Signed)
Patient c/o Palpitations:  High priority if patient c/o lightheadedness, shortness of breath, or chest pain  How long have you had palpitations/irregular HR/ Afib? Are you having the symptoms now?  Patient's husband states the patient woke up in afib, still feels very weak and laying down right now  Are you currently experiencing lightheadedness, SOB or CP?  No   Do you have a history of afib (atrial fibrillation) or irregular heart rhythm?  Pt has hx afib  Have you checked your BP or HR? (document readings if available):  6/14: 102, 48, 47, 47  Are you experiencing any other symptoms?  Weakness

## 2022-07-09 ENCOUNTER — Ambulatory Visit: Payer: Medicare PPO | Attending: Cardiovascular Disease | Admitting: Cardiovascular Disease

## 2022-07-09 ENCOUNTER — Encounter: Payer: Self-pay | Admitting: Cardiovascular Disease

## 2022-07-09 VITALS — BP 120/70 | HR 56 | Ht 69.0 in | Wt 187.0 lb

## 2022-07-09 DIAGNOSIS — I4819 Other persistent atrial fibrillation: Secondary | ICD-10-CM | POA: Diagnosis not present

## 2022-07-09 NOTE — Patient Instructions (Signed)
Medication Instructions:  Your physician recommends that you continue on your current medications as directed. Please refer to the Current Medication list given to you today.  *If you need a refill on your cardiac medications before your next appointment, please call your pharmacy*     Follow-Up: At [facility], you and your health needs are our priority.  As part of our continuing mission to provide you with exceptional heart care, we have created designated Provider Care Teams.  These Care Teams include your primary Cardiologist (physician) and Advanced Practice Providers (APPs -  Physician Assistants and Nurse Practitioners) who all work together to provide you with the care you need, when you need it.   Your next appointment:   1 year(s)  Provider:   Philip Nahser, MD     

## 2022-07-22 NOTE — Progress Notes (Signed)
Carelink Summary Report / Loop Recorder 

## 2022-07-23 DIAGNOSIS — N1831 Chronic kidney disease, stage 3a: Secondary | ICD-10-CM | POA: Diagnosis not present

## 2022-07-23 DIAGNOSIS — J984 Other disorders of lung: Secondary | ICD-10-CM | POA: Diagnosis not present

## 2022-07-23 DIAGNOSIS — R079 Chest pain, unspecified: Secondary | ICD-10-CM | POA: Diagnosis not present

## 2022-07-23 DIAGNOSIS — R0609 Other forms of dyspnea: Secondary | ICD-10-CM | POA: Diagnosis not present

## 2022-07-23 DIAGNOSIS — M549 Dorsalgia, unspecified: Secondary | ICD-10-CM | POA: Diagnosis not present

## 2022-07-23 DIAGNOSIS — I2721 Secondary pulmonary arterial hypertension: Secondary | ICD-10-CM | POA: Diagnosis not present

## 2022-07-23 DIAGNOSIS — I480 Paroxysmal atrial fibrillation: Secondary | ICD-10-CM | POA: Diagnosis not present

## 2022-07-23 DIAGNOSIS — D6869 Other thrombophilia: Secondary | ICD-10-CM | POA: Diagnosis not present

## 2022-07-23 DIAGNOSIS — I425 Other restrictive cardiomyopathy: Secondary | ICD-10-CM | POA: Diagnosis not present

## 2022-07-31 ENCOUNTER — Other Ambulatory Visit: Payer: Self-pay | Admitting: Cardiovascular Disease

## 2022-08-02 DIAGNOSIS — G4733 Obstructive sleep apnea (adult) (pediatric): Secondary | ICD-10-CM | POA: Diagnosis not present

## 2022-08-05 ENCOUNTER — Ambulatory Visit (INDEPENDENT_AMBULATORY_CARE_PROVIDER_SITE_OTHER): Payer: Medicare PPO

## 2022-08-05 DIAGNOSIS — I4819 Other persistent atrial fibrillation: Secondary | ICD-10-CM

## 2022-08-05 LAB — CUP PACEART REMOTE DEVICE CHECK
Date Time Interrogation Session: 20240712230412
Implantable Pulse Generator Implant Date: 20230524

## 2022-08-06 ENCOUNTER — Emergency Department (HOSPITAL_COMMUNITY)
Admission: EM | Admit: 2022-08-06 | Discharge: 2022-08-06 | Disposition: A | Discharge status: Home / Self Care | Payer: Medicare PPO | Attending: Emergency Medicine | Admitting: Emergency Medicine

## 2022-08-06 ENCOUNTER — Other Ambulatory Visit: Payer: Self-pay

## 2022-08-06 ENCOUNTER — Encounter (HOSPITAL_COMMUNITY): Payer: Self-pay

## 2022-08-06 DIAGNOSIS — Z79899 Other long term (current) drug therapy: Secondary | ICD-10-CM | POA: Insufficient documentation

## 2022-08-06 DIAGNOSIS — R112 Nausea with vomiting, unspecified: Secondary | ICD-10-CM | POA: Diagnosis present

## 2022-08-06 DIAGNOSIS — Z7901 Long term (current) use of anticoagulants: Secondary | ICD-10-CM | POA: Diagnosis not present

## 2022-08-06 DIAGNOSIS — I5030 Unspecified diastolic (congestive) heart failure: Secondary | ICD-10-CM | POA: Diagnosis not present

## 2022-08-06 DIAGNOSIS — U071 COVID-19: Secondary | ICD-10-CM | POA: Insufficient documentation

## 2022-08-06 DIAGNOSIS — I480 Paroxysmal atrial fibrillation: Secondary | ICD-10-CM | POA: Diagnosis not present

## 2022-08-06 DIAGNOSIS — R197 Diarrhea, unspecified: Secondary | ICD-10-CM | POA: Diagnosis not present

## 2022-08-06 DIAGNOSIS — R1111 Vomiting without nausea: Secondary | ICD-10-CM | POA: Diagnosis not present

## 2022-08-06 DIAGNOSIS — G43811 Other migraine, intractable, with status migrainosus: Secondary | ICD-10-CM | POA: Insufficient documentation

## 2022-08-06 DIAGNOSIS — G43909 Migraine, unspecified, not intractable, without status migrainosus: Secondary | ICD-10-CM | POA: Diagnosis not present

## 2022-08-06 DIAGNOSIS — R001 Bradycardia, unspecified: Secondary | ICD-10-CM | POA: Diagnosis not present

## 2022-08-06 LAB — CBC WITH DIFFERENTIAL/PLATELET
Abs Immature Granulocytes: 0.02 10*3/uL (ref 0.00–0.07)
Basophils Absolute: 0 10*3/uL (ref 0.0–0.1)
Basophils Relative: 0 %
Eosinophils Absolute: 0 10*3/uL (ref 0.0–0.5)
Eosinophils Relative: 0 %
HCT: 37.4 % (ref 36.0–46.0)
Hemoglobin: 12.2 g/dL (ref 12.0–15.0)
Immature Granulocytes: 0 %
Lymphocytes Relative: 15 %
Lymphs Abs: 1.4 10*3/uL (ref 0.7–4.0)
MCH: 29.6 pg (ref 26.0–34.0)
MCHC: 32.6 g/dL (ref 30.0–36.0)
MCV: 90.8 fL (ref 80.0–100.0)
Monocytes Absolute: 0.6 10*3/uL (ref 0.1–1.0)
Monocytes Relative: 6 %
Neutro Abs: 7.4 10*3/uL (ref 1.7–7.7)
Neutrophils Relative %: 79 %
Platelets: 247 10*3/uL (ref 150–400)
RBC: 4.12 MIL/uL (ref 3.87–5.11)
RDW: 13.5 % (ref 11.5–15.5)
WBC: 9.4 10*3/uL (ref 4.0–10.5)
nRBC: 0 % (ref 0.0–0.2)

## 2022-08-06 LAB — COMPREHENSIVE METABOLIC PANEL
ALT: 17 U/L (ref 0–44)
AST: 24 U/L (ref 15–41)
Albumin: 3.3 g/dL — ABNORMAL LOW (ref 3.5–5.0)
Alkaline Phosphatase: 91 U/L (ref 38–126)
Anion gap: 9 (ref 5–15)
BUN: 12 mg/dL (ref 8–23)
CO2: 24 mmol/L (ref 22–32)
Calcium: 9.2 mg/dL (ref 8.9–10.3)
Chloride: 104 mmol/L (ref 98–111)
Creatinine, Ser: 0.88 mg/dL (ref 0.44–1.00)
GFR, Estimated: >60 mL/min (ref >60)
Glucose, Bld: 100 mg/dL — ABNORMAL HIGH (ref 70–99)
Potassium: 3.6 mmol/L (ref 3.5–5.1)
Sodium: 137 mmol/L (ref 135–145)
Total Bilirubin: 0.6 mg/dL (ref 0.3–1.2)
Total Protein: 6.4 g/dL — ABNORMAL LOW (ref 6.5–8.1)

## 2022-08-06 LAB — SARS CORONAVIRUS 2 BY RT PCR: SARS Coronavirus 2 by RT PCR: POSITIVE — AB

## 2022-08-06 MED ORDER — METOCLOPRAMIDE HCL 5 MG/ML IJ SOLN
10.0000 mg | Freq: Once | INTRAMUSCULAR | Status: AC
  Administered 2022-08-06: 10 mg via INTRAVENOUS
  Filled 2022-08-06: qty 2

## 2022-08-06 MED ORDER — LACTATED RINGERS IV BOLUS
1000.0000 mL | Freq: Once | INTRAVENOUS | Status: AC
  Administered 2022-08-06: 1000 mL via INTRAVENOUS

## 2022-08-06 NOTE — Discharge Instructions (Signed)
Lab work looks normal but you did test positive for COVID today.  You may need to take Tylenol for headaches over the next few days get lots of rest to make sure you stay hydrated.  They recommend quarantining for approximately 5 days.  If you start having shortness of breath, feel like you go back into A-fib and it does not resolve or any passing out you should return to the emergency room.

## 2022-08-06 NOTE — ED Provider Notes (Signed)
[place] EMERGENCY DEPARTMENT AT [facility] Provider Note   CSN: [id number] Arrival date & time: 08/06/22  1810     History  Chief Complaint  Patient presents with   Headache    [name] is a 72 y.o. female.  Patient is a 72 year old female with a history of paroxysmal atrial fibrillation on Tikosyn and Xarelto, diastolic CHF, restrictive cardiomyopathy, migraines who is presenting today with complaint of migraine headache, nausea vomiting and some mild diarrhea.  Patient reports that she felt fine earlier today she took a nap in the afternoon and when she woke up felt like she was in atrial fibrillation.  She has had several episodes of atrial fibrillation since her ablation in April.  She has been compliant with her medications.  She reports it did not seem to go away and she took oh propranolol and she started having a migraine.  The migraine continue to worsen which was pain in the front of her head and caused her to have multiple episodes of vomiting and some diarrhea.  She reports that she thinks the vomiting put her back into a normal rhythm.  She currently is complaining of a headache but denies any visual changes, unilateral numbness weakness.  No chest pain shortness of breath or abdominal pain.  She has not recently been ill or had sick contacts.  No recent medication changes.  The history is provided by the patient, the spouse and medical records.       Home Medications Prior to Admission medications   Medication Sig Start Date End Date Taking? Authorizing Provider  atorvastatin (LIPITOR) 20 MG tablet TAKE 1 TABLET EVERY DAY AT 6PM 07/31/22   Nahser, Deloris Ping, MD  Cholecalciferol (VITAMIN D-3 PO) Take 5,000 Units by mouth daily with supper.    [provider]  Cyanocobalamin (VITAMIN B-12) 5000 MCG SUBL Take 5,000 mcg by mouth daily with supper.    [provider]  dofetilide (TIKOSYN) 125 MCG capsule Take 3 capsules (375 mcg total)  by mouth 2 (two) times daily. 04/05/21   Nahser, Deloris Ping, MD  ezetimibe (ZETIA) 10 MG tablet TAKE 1 TABLET EVERY DAY 07/31/22   Nahser, Deloris Ping, MD  gabapentin (NEURONTIN) 600 MG tablet Take 1 tablet (600 mg total) by mouth 3 (three) times daily. 11/12/21   Penumalli, Glenford Bayley, MD  Multiple Vitamins-Minerals (PRESERVISION AREDS 2 PO) Take 1 capsule by mouth 2 (two) times daily. With breakfast & with supper    [provider]  NONFORMULARY OR COMPOUNDED ITEM Place 1 spray into both nostrils 2 (two) times daily as needed (migraine headaches.). Lidocaine Isotonic Nasal Spray 4% Compounded via [facility]    [provider]  pantoprazole (PROTONIX) 40 MG tablet Take 1 tablet (40 mg total) by mouth daily. 05/06/22 06/05/22  Sheilah Pigeon, PA-C  Polyethyl Glycol-Propyl Glycol (SYSTANE) 0.4-0.3 % SOLN Place 1 drop into both eyes in the morning.    [provider]  propranolol (INDERAL) 10 MG tablet Take 1 tablet (10 mg total) by mouth every 6 (six) hours as needed (for palpitations). 04/24/21   Nahser, Deloris Ping, MD  rivaroxaban (XARELTO) 20 MG TABS tablet TAKE 1 TABLET BY MOUTH DAILY WITH SUPPER Patient taking differently: Take 20 mg by mouth daily with breakfast. 11/23/21   Nahser, Deloris Ping, MD  spironolactone (ALDACTONE) 25 MG tablet TAKE 1 TABLET EVERY DAY 07/31/22   Nahser, Deloris Ping, MD      Allergies  Oxycodone, Topamax [topiramate], Codeine, Other, Penicillins, and Tramadol    Review of Systems   Review of Systems  Physical Exam Updated Vital Signs BP 108/69 (BP Location: Right Arm)   Pulse 61   Temp 97.7 F (36.5 C) (Oral)   Resp 16   Ht 5\' 9"  (1.753 m)   Wt 82.6 kg   LMP 01/21/1990   SpO2 100%   BMI 26.88 kg/m  Physical Exam Vitals and nursing note reviewed.  Constitutional:      General: She is not in acute distress.    Appearance: Normal appearance. She is well-developed.  HENT:     Head: Normocephalic and atraumatic.     Mouth/Throat:      Mouth: Mucous membranes are moist.  Eyes:     Pupils: Pupils are equal, round, and reactive to light.  Cardiovascular:     Rate and Rhythm: Regular rhythm. Bradycardia present.     Heart sounds: Normal heart sounds. No murmur heard.    No friction rub.  Pulmonary:     Effort: Pulmonary effort is normal.     Breath sounds: Normal breath sounds. No wheezing or rales.  Abdominal:     General: Bowel sounds are normal. There is no distension.     Palpations: Abdomen is soft.     Tenderness: There is no abdominal tenderness. There is no guarding or rebound.  Musculoskeletal:        General: No tenderness. Normal range of motion.     Right lower leg: No edema.     Left lower leg: No edema.     Comments: No edema  Skin:    General: Skin is warm and dry.     Findings: No rash.  Neurological:     Mental Status: She is alert and oriented to person, place, and time.     Cranial Nerves: No cranial nerve deficit or dysarthria.     Sensory: Sensation is intact.     Motor: Tremor present. No weakness or pronator drift.     Coordination: Coordination normal. Heel to Shin Test normal.     Comments: Essential tremor in the bilateral upper extremities.  Psychiatric:        Behavior: Behavior normal.     ED Results / Procedures / Treatments   Labs (all labs ordered are listed, but only abnormal results are displayed) Labs Reviewed  SARS CORONAVIRUS 2 BY RT PCR - Abnormal; Notable for the following components:      Result Value   SARS Coronavirus 2 by RT PCR POSITIVE (*)    All other components within normal limits  COMPREHENSIVE METABOLIC PANEL - Abnormal; Notable for the following components:   Glucose, Bld 100 (*)    Total Protein 6.4 (*)    Albumin 3.3 (*)    All other components within normal limits  CBC WITH DIFFERENTIAL/PLATELET    EKG EKG Interpretation Date/Time:  Tuesday August 06 2022 19:01:40 EDT Ventricular Rate:  57 PR Interval:  146 QRS Duration:  99 QT  Interval:  440 QTC Calculation: 429 R Axis:   70  Text Interpretation: Sinus rhythm Low voltage, precordial leads No significant change since last tracing Confirmed by Gwyneth Sprout ([id number]) on 08/06/2022 7:21:13 PM  Radiology No results found.  Procedures Procedures    Medications Ordered in ED Medications  metoCLOPramide (REGLAN) injection 10 mg (10 mg Intravenous Given 08/06/22 1847)  lactated ringers bolus 1,000 mL (1,000 mLs Intravenous New Bag/Given 08/06/22 1848)    ED Course/  Medical Decision Making/ A&P                             Medical Decision Making Amount and/or Complexity of Data Reviewed Independent Historian: spouse External Data Reviewed: notes. Labs: ordered. Decision-making details documented in ED Course. ECG/medicine tests: ordered and independent interpretation performed. Decision-making details documented in ED Course.  Risk Prescription drug management.   Pt with multiple medical problems and comorbidities and presenting today with a complaint that caries a high risk for morbidity and mortality.  Here today with several complaints.  Patient did feel like she was in A-fib earlier today but now reports that she seems to be back in regular rhythm but she has had an ongoing migraine headache and several episodes of vomiting and diarrhea.  Patient is overall well-appearing with no focal neurologic deficits with lower suspicion for stroke, intracranial hemorrhage.  She denies any infectious etiology but concern for possible early developing URI or other cause.  Possibility for her typical migraine headache as well with associated symptoms.  Patient's currently on continuous cardiac monitoring and is in sinus bradycardia at this time.  I independently interpreted patient's labs and EKG. EKG was sinus bradycardia today but no acute findings.  CBC, CMP without acute findings.  COVID was positive today Patient was given a headache cocktail and will  reassess.   8:13 PM After headache cocktail patient reports that headache is still present but much better.  Feel that some of the headache and nausea and vomiting are most likely from early COVID infection.  Given patient's other medication she is not a candidate for Paxlovid.  She is otherwise well-appearing with normal vital signs.  No indication for admission or further testing at this time.  Findings were discussed with the patient and her husband.  No social determinants affecting her discharge today.  She was given return precautions.        Final Clinical Impression(s) / ED Diagnoses Final diagnoses:  COVID  Other migraine with status migrainosus, intractable    Rx / DC Orders ED Discharge Orders     None         Gwyneth Sprout, MD 08/06/22 2014

## 2022-08-06 NOTE — ED Triage Notes (Signed)
Pt here w [facility] d/t migraine that started 2 hrs ago. Hx migraine. A&O X4. VSS.

## 2022-08-09 ENCOUNTER — Ambulatory Visit: Payer: Medicare PPO | Admitting: Cardiovascular Disease

## 2022-08-15 ENCOUNTER — Other Ambulatory Visit: Payer: Self-pay | Admitting: Cardiovascular Disease

## 2022-08-16 NOTE — Progress Notes (Signed)
Carelink Summary Report / Loop Recorder 

## 2022-08-19 ENCOUNTER — Telehealth: Payer: Self-pay

## 2022-08-19 NOTE — Telephone Encounter (Signed)
Transition Care Management Unsuccessful Follow-up Telephone Call  Date of discharge and from where:  [facility] 7[facility]  Attempts:  1st Attempt  Reason for unsuccessful TCM follow-up call:  No answer/busy   [facility] Guide, [facility] [phone] [address] [place], [place], [place] [address] Phone: [phone] Email: [email or web address]

## 2022-08-19 NOTE — Telephone Encounter (Signed)
Transition Care Management Follow-up Telephone Call Date of discharge and from where: [name] How have you been since you were released from the hospital? Doing ok Any questions or concerns? No  Items Reviewed: Did the pt receive and understand the discharge instructions provided? Yes  Medications obtained and verified? Yes  Other? No  Any new allergies since your discharge? No  Dietary orders reviewed? No Do you have support at home? Yes    Follow up appointments reviewed:  PCP Hospital f/u appt confirmed? Yes  Scheduled to see  on  @ . Specialist Hospital f/u appt confirmed? No  Scheduled to see  on  @ . Are transportation arrangements needed? No  If their condition worsens, is the pt aware to call PCP or go to the Emergency Dept.? Yes Was the patient provided with contact information for the PCP's office or ED? Yes Was to pt encouraged to call back with questions or concerns? Yes

## 2022-08-30 ENCOUNTER — Encounter: Payer: Self-pay | Admitting: Cardiovascular Disease

## 2022-08-30 ENCOUNTER — Ambulatory Visit: Payer: Medicare PPO | Attending: Cardiovascular Disease | Admitting: Cardiovascular Disease

## 2022-08-30 VITALS — BP 116/72 | HR 54 | Ht 69.0 in | Wt 188.2 lb

## 2022-08-30 DIAGNOSIS — I483 Typical atrial flutter: Secondary | ICD-10-CM

## 2022-08-30 DIAGNOSIS — I4819 Other persistent atrial fibrillation: Secondary | ICD-10-CM

## 2022-08-30 NOTE — Progress Notes (Signed)
Electrophysiology Office Note:    Date:  08/30/2022   ID:  [name], DOB 12/25/1950, MRN [id number]  PCP:  Creola Corn, MD   [facility] Providers Cardiologist:  Kristeen Miss, MD Electrophysiologist:  Maurice Small, MD     Referring MD: Creola Corn, MD   History of Present Illness:    [name] is a 72 y.o. female with a hx listed below, significant for paroxysmal atrial fibrillation, referred for arrhythmia management.     She has a history of atrial fibrillation managed with Tikosyn. She had breakthrough episodes of AF and flutter.  Linq interrogation today shows a 2% arrhythmia burden, the majority of which appears to be an atrial flutter.   She is very symptomatic with AF - very fatigued, short of breath.  She underwent ablation of atrial ablation and flutter on May 06, 2022.  The procedure was somewhat difficult --the CTI line was difficult, and she required cardioversion twice during the procedure.      Since her ablation, she has had a few episodes of arrhythmia.  The most recent episode was this morning and lasted just a few minutes.     EKGs/Labs/Other Studies Reviewed Today:     ILR report reviewed today - < 0.2% AF   Recent Labs: 06/03/2022: Magnesium 2.0 08/06/2022: ALT 17; BUN 12; Creatinine, Ser 0.88; Hemoglobin 12.2; Platelets 247; Potassium 3.6; Sodium 137     Physical Exam:    VS:  BP 116/72 (BP Location: Left Arm, Patient Position: Sitting, Cuff Size: Large)   Pulse (!) 54   Ht 5\' 9"  (1.753 m)   Wt 188 lb 3.2 oz (85.4 kg)   LMP 01/21/1990   SpO2 99%   BMI 27.79 kg/m     Wt Readings from Last 3 Encounters:  08/30/22 188 lb 3.2 oz (85.4 kg)  08/06/22 182 lb (82.6 kg)  07/09/22 187 lb (84.8 kg)     GEN:  Well nourished, well developed in no acute distress CARDIAC: RRR, no murmurs, rubs, gallops RESPIRATORY:  Normal work of breathing MUSCULOSKELETAL: no edema    ASSESSMENT & PLAN:    Atrial  fibrillation and typical flutter:  She is very symptomatic.  She underwent ablation of atrial fibrillation and flutter on May 06, 2022 She has had 2 recurrences of atrial flutter after the blinding.  From her ablation We discussed management options.  I recommended repeat EP study to see if there is breakthrough from the prior ablation We will schedule atrial fibrillation ablation today. I will see her in follow-up prior to the ablation.  If she does not have any episodes between now and that time, we may consider canceling the case. Continue dofetilide  We discussed the indication, rationale, logistics, anticipated benefits, and potential risks of the ablation procedure including but not limited to -- bleed at the groin access site, chest pain, damage to nearby organs such as the diaphragm, lungs, or esophagus, need for a drainage tube, or prolonged hospitalization. I explained that the risk for stroke, heart attack, need for open chest surgery, or even death is very low but not zero. she  expressed understanding and wishes to proceed.   Secondary hypercoagulable state:  CHADS2Vasc scor of 3. Continue xarelto 20mg  daily.  [employer] Linq in place:   OSA: continue CPAP        Medication Adjustments/Labs and Tests Ordered: Current medicines are reviewed at length with the patient today.  Concerns regarding medicines are outlined above.  Orders  Placed This Encounter  Procedures   EKG 12-Lead   No orders of the defined types were placed in this encounter.    Signed, Maurice Small, MD  08/30/2022 11:18 AM    [facility]

## 2022-08-30 NOTE — Patient Instructions (Signed)
Medication Instructions:  Your physician recommends that you continue on your current medications as directed. Please refer to the Current Medication list given to you today. *If you need a refill on your cardiac medications before your next appointment, please call your pharmacy*  Testing/Procedures: Atrial Fibrillation/Flutter Ablation - we will contact you to set this up when Dr Morrie Sheldon schedule is available for next year  Your physician has recommended that you have an ablation. Catheter ablation is a medical procedure used to treat some cardiac arrhythmias (irregular heartbeats). During catheter ablation, a long, thin, flexible tube is put into a blood vessel in your groin (upper thigh), or neck. This tube is called an ablation catheter. It is then guided to your heart through the blood vessel. Radio frequency waves destroy small areas of heart tissue where abnormal heartbeats may cause an arrhythmia to start. Please see the instruction sheet given to you today.   Follow-Up: At [facility], you and your health needs are our priority.  As part of our continuing mission to provide you with exceptional heart care, we have created designated Provider Care Teams.  These Care Teams include your primary Cardiologist (physician) and Advanced Practice Providers (APPs -  Physician Assistants and Nurse Practitioners) who all work together to provide you with the care you need, when you need it.  We recommend signing up for the patient portal called "MyChart".  Sign up information is provided on this After Visit Summary.  MyChart is used to connect with patients for Virtual Visits (Telemedicine).  Patients are able to view lab/test results, encounter notes, upcoming appointments, etc.  Non-urgent messages can be sent to your provider as well.   To learn more about what you can do with MyChart, go to [email or web address].    Your next appointment:   We will contact you to set this up when  setting up ablation date  Provider:   York Pellant, MD

## 2022-09-09 ENCOUNTER — Ambulatory Visit (INDEPENDENT_AMBULATORY_CARE_PROVIDER_SITE_OTHER): Payer: Medicare PPO

## 2022-09-09 DIAGNOSIS — H524 Presbyopia: Secondary | ICD-10-CM | POA: Diagnosis not present

## 2022-09-09 DIAGNOSIS — H35371 Puckering of macula, right eye: Secondary | ICD-10-CM | POA: Diagnosis not present

## 2022-09-09 DIAGNOSIS — H52203 Unspecified astigmatism, bilateral: Secondary | ICD-10-CM | POA: Diagnosis not present

## 2022-09-09 DIAGNOSIS — H04123 Dry eye syndrome of bilateral lacrimal glands: Secondary | ICD-10-CM | POA: Diagnosis not present

## 2022-09-09 DIAGNOSIS — H43813 Vitreous degeneration, bilateral: Secondary | ICD-10-CM | POA: Diagnosis not present

## 2022-09-09 DIAGNOSIS — I4819 Other persistent atrial fibrillation: Secondary | ICD-10-CM

## 2022-09-09 DIAGNOSIS — H40023 Open angle with borderline findings, high risk, bilateral: Secondary | ICD-10-CM | POA: Diagnosis not present

## 2022-09-09 DIAGNOSIS — H353132 Nonexudative age-related macular degeneration, bilateral, intermediate dry stage: Secondary | ICD-10-CM | POA: Diagnosis not present

## 2022-09-09 DIAGNOSIS — H2513 Age-related nuclear cataract, bilateral: Secondary | ICD-10-CM | POA: Diagnosis not present

## 2022-09-09 LAB — CUP PACEART REMOTE DEVICE CHECK
Date Time Interrogation Session: 20240818230855
Implantable Pulse Generator Implant Date: 20230524

## 2022-09-19 DIAGNOSIS — C4441 Basal cell carcinoma of skin of scalp and neck: Secondary | ICD-10-CM | POA: Diagnosis not present

## 2022-09-19 NOTE — Progress Notes (Signed)
Carelink Summary Report / Loop Recorder 

## 2022-09-26 DIAGNOSIS — G43019 Migraine without aura, intractable, without status migrainosus: Secondary | ICD-10-CM | POA: Diagnosis not present

## 2022-09-26 DIAGNOSIS — G43719 Chronic migraine without aura, intractable, without status migrainosus: Secondary | ICD-10-CM | POA: Diagnosis not present

## 2022-10-14 ENCOUNTER — Ambulatory Visit (INDEPENDENT_AMBULATORY_CARE_PROVIDER_SITE_OTHER): Payer: Medicare PPO

## 2022-10-14 DIAGNOSIS — I4819 Other persistent atrial fibrillation: Secondary | ICD-10-CM

## 2022-10-14 LAB — CUP PACEART REMOTE DEVICE CHECK
Date Time Interrogation Session: 20240920230316
Implantable Pulse Generator Implant Date: 20230524

## 2022-10-22 DIAGNOSIS — M461 Sacroiliitis, not elsewhere classified: Secondary | ICD-10-CM | POA: Diagnosis not present

## 2022-10-25 NOTE — Progress Notes (Signed)
Carelink Summary Report / Loop Recorder 

## 2022-10-29 ENCOUNTER — Telehealth: Payer: Self-pay

## 2022-10-29 DIAGNOSIS — Z85828 Personal history of other malignant neoplasm of skin: Secondary | ICD-10-CM | POA: Diagnosis not present

## 2022-10-29 DIAGNOSIS — C44319 Basal cell carcinoma of skin of other parts of face: Secondary | ICD-10-CM | POA: Diagnosis not present

## 2022-10-29 DIAGNOSIS — I4819 Other persistent atrial fibrillation: Secondary | ICD-10-CM

## 2022-10-29 NOTE — Telephone Encounter (Signed)
Pt is scheduled for Afib Ablation with Dr. Nelly Laurence on 11/4 at 730.  CT is scheduled on 10/17. She will come to [facility] on 10/14 for labwork.   Instruction letters will be mailed to pt per her request.

## 2022-10-30 ENCOUNTER — Telehealth: Payer: Self-pay | Admitting: Cardiovascular Disease

## 2022-10-30 NOTE — Telephone Encounter (Signed)
Patient is calling because she has an ablation scheduled for 11/04 and would like to have it scheduled for a different week. Please advise.

## 2022-10-30 NOTE — Telephone Encounter (Signed)
Pt has been rescheduled to 12/3 at 730.

## 2022-10-30 NOTE — Telephone Encounter (Signed)
Pt has rescheduled her Afib Ablation with Dr. Nelly Laurence to 12/3 at 730.  CT is scheduled on 11/7. She will come to [facility] on 11/5 for labwork.    Instruction letters will be mailed to pt per her request

## 2022-11-04 ENCOUNTER — Other Ambulatory Visit: Payer: Medicare PPO

## 2022-11-07 ENCOUNTER — Ambulatory Visit (HOSPITAL_COMMUNITY): Payer: Medicare PPO

## 2022-11-09 ENCOUNTER — Other Ambulatory Visit: Payer: Self-pay | Admitting: Cardiovascular Disease

## 2022-11-09 DIAGNOSIS — I4819 Other persistent atrial fibrillation: Secondary | ICD-10-CM

## 2022-11-11 NOTE — Telephone Encounter (Signed)
Prescription refill request for Xarelto received.  Indication:afib Last office visit:8/24 Weight:85.4  kg Age:72 Scr:0.88 7/24 CrCl:77.91  ml/min  Prescription refilled

## 2022-11-18 ENCOUNTER — Encounter: Payer: Self-pay | Admitting: Diagnostic Neuroimaging

## 2022-11-18 ENCOUNTER — Ambulatory Visit: Payer: Medicare PPO | Admitting: Diagnostic Neuroimaging

## 2022-11-18 ENCOUNTER — Ambulatory Visit (INDEPENDENT_AMBULATORY_CARE_PROVIDER_SITE_OTHER): Payer: Medicare PPO

## 2022-11-18 VITALS — BP 123/71 | HR 69 | Ht 69.0 in | Wt 188.0 lb

## 2022-11-18 DIAGNOSIS — I4819 Other persistent atrial fibrillation: Secondary | ICD-10-CM | POA: Diagnosis not present

## 2022-11-18 DIAGNOSIS — G250 Essential tremor: Secondary | ICD-10-CM

## 2022-11-18 NOTE — Patient Instructions (Signed)
  ESSENTIAL TREMOR - regarding tremor control, currently on propranolol 10mg  as needed for atrial fibrillation - continue gabapentin to 600mg  three times a day (can be refilled by Dr. Neale Burly in future) - may consider primidone as well (but interaction with xarelto) - may consider deep brain stimulator in future  MIGRAINE WITHOUT AURA - per Dr. Neale Burly (who started gabapentin)  SYNCOPE (10/02/20, March 2023) - per cardiology and PCP

## 2022-11-18 NOTE — Progress Notes (Signed)
[facility]  PATIENT: [name] DOB: [birth year]  REFERRING CLINICIAN: Creola Corn, MD  HISTORY FROM: patient REASON FOR VISIT: follow up   HISTORICAL  CHIEF COMPLAINT:  Chief Complaint  Patient presents with   Follow-up    RM 7, here alone Pt is here for follow up on essential tremor. Pt states her tremor has increased.      HISTORY OF PRESENT ILLNESS:   UPDATE (11/18/22, VRP): Since last visit, doing well. Tremor minimally increased.   UPDATE (11/12/21, VRP): Since last visit, tremor continue. Symptoms are slightly progressed. Severity is moderate. No alleviating or aggravating factors. Tolerating gabapentin 600mg  twice a day.    UPDATE (08/28/21, VRP): Since last visit, doing worse with tremor.  Symptoms are progressive. Had another syncope in March 2023. Now on propranolol as needed. More stress.  UPDATE (12/05/20, VRP): Since last visit, doing well except had syncope event in pre-op area for husband surgery, in 10/02/20. Had prodromal sweating, heart racing, energy draining sensations. Occurred while sitting down. Then passed out and was taken to ER. Left before being seen officially, after waiting 13 hours.   UPDATE (05/19/20, VRP): Since last visit, tremor continues and now patient wants to try meds. Has propranolol as needed (rarely uses currently) for atrial fibrillation. Tremor worse with fatigue.  UPDATE 07/29/16: Since last visit, doing a little better. Now working in garden and around the home, and feeling better. Walking short distances at home (5-10 minutes). Tremor stable.   PRIOR HPI (03/25/16): 72 year old female here for evaluation of shortness of breath and dyspnea on exertion. Since January 2018 patient has had intermittent problems with shortness of breath and dyspnea on exertion, and has had thorough evaluation by pulmonary and cardiology physicians, without specific cause found. She does have history of atrial fibrillation, atrial  flutter, congestive heart failure, but these do not appear to be explain patient's dyspnea symptoms adequately, and therefore possibility of an occult neuromuscular disease has been raised and therefore patient presenting to me for further evaluation. Patient requested for me to see her for this problem due to her meeting me while I was treating one of her relatives.  Patient reports a variety of constitutional symptoms include chest pain, ringing in ears spinning sensation shortness of breath snoring tremor. Patient can feel shortness of breath when she exerts herself as well as at rest. Sometimes she thinks about a problem her symptoms can get worse. She denies any muscle twitching, muscle weakness focally.   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 Multiple family members on her father's side including father, sister, nephew and others have history of tremor. Some of them have tremor in chin, head and some in the hands. Patient has been tried on primidone in the past without relief and some aggravation of symptoms.    REVIEW OF SYSTEMS: Full 14 system review of systems performed and negative with exception of: hearing loss.   ALLERGIES: Allergies  Allergen Reactions   Oxycodone Hives    Hives    Topamax [Topiramate] Other (See Comments)    hallucinations    Codeine Nausea And Vomiting    VERTIGO   Other Other (See Comments)    Adhesive-prolonged exposure causes skin redness/irritation.   Penicillins Rash   Tramadol     dizziness    HOME MEDICATIONS: Outpatient Medications Prior to Visit  Medication Sig Dispense Refill   atorvastatin (LIPITOR) 20 MG tablet TAKE 1 TABLET  EVERY DAY AT 6PM 90 tablet 3   Cholecalciferol (VITAMIN D-3 PO) Take 5,000 Units by mouth daily with supper.     Cyanocobalamin (VITAMIN B-12) 5000 MCG SUBL Take 5,000 mcg by mouth daily with supper.     dofetilide (TIKOSYN) 125 MCG capsule TAKE 3  CAPSULES TWICE DAILY 540 capsule 3   ezetimibe (ZETIA) 10 MG tablet TAKE 1 TABLET EVERY DAY 30 tablet 11   gabapentin (NEURONTIN) 600 MG tablet Take 1 tablet (600 mg total) by mouth 3 (three) times daily. 90 tablet 6   Multiple Vitamins-Minerals (PRESERVISION AREDS 2 PO) Take 1 capsule by mouth 2 (two) times daily. With breakfast & with supper     NONFORMULARY OR COMPOUNDED ITEM Place 1 spray into both nostrils 2 (two) times daily as needed (migraine headaches.). Lidocaine Isotonic Nasal Spray 4% Compounded via [facility]     Polyethyl Glycol-Propyl Glycol (SYSTANE) 0.4-0.3 % SOLN Place 1 drop into both eyes in the morning.     propranolol (INDERAL) 10 MG tablet Take 1 tablet (10 mg total) by mouth every 6 (six) hours as needed (for palpitations).     Propylene Glycol (SYSTANE BALANCE OP) Apply to eye in the morning and at bedtime.     spironolactone (ALDACTONE) 25 MG tablet TAKE 1 TABLET EVERY DAY 90 tablet 3   XARELTO 20 MG TABS tablet TAKE 1 TABLET EVERY DAY WITH SUPPER 90 tablet 3   pantoprazole (PROTONIX) 40 MG tablet Take 1 tablet (40 mg total) by mouth daily. 30 tablet 0   No facility-administered medications prior to visit.    PAST MEDICAL HISTORY: Past Medical History:  Diagnosis Date   Atrial fibrillation (HCC)    Back pain    nerve problems-had series of three injections   Bone spur    Spine   Chronic anticoagulation    Diastolic congestive heart failure (HCC)    Hearing loss    History of shingles    Hyperlipemia    Migraine headache    Obstructive sleep apnea    uses CPAP   OSA on CPAP    Paroxysmal atrial fibrillation (HCC)    Restrictive cardiomyopathy (HCC)    Tremor    Tubal pregnancy     PAST SURGICAL HISTORY: Past Surgical History:  Procedure Laterality Date   A-FLUTTER ABLATION N/A 05/06/2022   Procedure: A-FLUTTER ABLATION;  Surgeon: Mealor, Roberts Gaudy, MD;  Location: [facility];  Service: Cardiovascular;  Laterality: N/A;   ATRIAL  FIBRILLATION ABLATION N/A 05/06/2022   Procedure: ATRIAL FIBRILLATION ABLATION;  Surgeon: Maurice Small, MD;  Location: [facility];  Service: Cardiovascular;  Laterality: N/A;   BREAST EXCISIONAL BIOPSY Left    benign   CARDIAC CATHETERIZATION     CARDIOVERSION     CHOLECYSTECTOMY     ECTOPIC PREGNANCY SURGERY  1980   EYE SURGERY     left eye injections x2-was having "stroke like" flashes of light   heart biopsy  2008   [facility]   HYSTEROSCOPY  06/2003   and D&C-simple hyperplasia secondary PMP bleeding   implantable loop recorder placement  06/13/2021   Medtronic Reveal Linq model LNQ 22 (SN [id number]) implantable loop recorder implanted for syncope and AF management   ROTATOR CUFF REPAIR     bilateral    FAMILY HISTORY: Family History  Problem Relation Age of Onset   Hypertension Father    Dementia Father    Seizures Father    Atrial fibrillation Mother  Cancer Maternal Grandmother        mouth   Cancer Maternal Uncle        unknown type   Thyroid cancer Sister    Breast cancer Sister        breast cancer   Heart disease Sister    Atrial fibrillation Sister    Dementia Sister     SOCIAL HISTORY:  Social History   Socioeconomic History   Marital status: Married    Spouse name: [name]   Number of children: 0   Years of education: college   Highest education level: Not on file  Occupational History    Comment: retired  Tobacco Use   Smoking status: Never   Smokeless tobacco: Never  Vaping Use   Vaping status: Never Used  Substance and Sexual Activity   Alcohol use: Not Currently   Drug use: No   Sexual activity: Not Currently    Birth control/protection: Post-menopausal  Other Topics Concern   Not on file  Social History Narrative   Patient lives in [place] with her husband [name])  and she is retired. College education.   Caffeine- 2-3 cups tea daily .   Social Determinants of Health   Financial Resource Strain: Not on file  Food  Insecurity: Not on file  Transportation Needs: Not on file  Physical Activity: Not on file  Stress: Not on file  Social Connections: Not on file  Intimate Partner Violence: Not on file     PHYSICAL EXAM  GENERAL EXAM/CONSTITUTIONAL: Vitals:  Vitals:   11/18/22 1601  BP: 123/71  Pulse: 69  Weight: 188 lb (85.3 kg)  Height: 5\' 9"  (1.753 m)   Wt Readings from Last 3 Encounters:  11/18/22 188 lb (85.3 kg)  08/30/22 188 lb 3.2 oz (85.4 kg)  08/06/22 182 lb (82.6 kg)   Body mass index is 27.76 kg/m. No results found. Patient is in no distress; well developed, nourished and groomed; neck is supple  CARDIOVASCULAR: Examination of carotid arteries is normal; no carotid bruits IRREGULAR RATE AND RHYTHM Examination of peripheral vascular system by observation and palpation is normal  EYES: Ophthalmoscopic exam of optic discs and posterior segments is normal; no papilledema or hemorrhages  MUSCULOSKELETAL: Gait, strength, tone, movements noted in Neurologic exam below  NEUROLOGIC: MENTAL STATUS:      No data to display         awake, alert, oriented to person, place and time recent and remote memory intact normal attention and concentration language fluent, comprehension intact, naming intact,  fund of knowledge appropriate  CRANIAL NERVE:  2nd - no papilledema on fundoscopic exam 2nd, 3rd, 4th, 6th - pupils equal and reactive to light, visual fields full to confrontation, extraocular muscles intact, no nystagmus 5th - facial sensation symmetric 7th - facial strength symmetric 8th - hearing intact 9th - palate elevates symmetrically, uvula midline 11th - shoulder shrug symmetric 12th - tongue protrusion midline MILD HEAD TREMOR  MOTOR:  normal bulk and tone, full strength in the BUE, BLE MODERATE POSTURAL TREMOR IN BUE NO BRADYKINESIA  SENSORY:  normal and symmetric to light touch, temperature, vibration  COORDINATION:  finger-nose-finger, fine finger  movements --> ACTION TREMOR  REFLEXES:  deep tendon reflexes present and symmetric TRACE AT ANKLES  GAIT/STATION:  narrow based gait    DIAGNOSTIC DATA (LABS, IMAGING, TESTING) - I reviewed patient records, labs, notes, testing and imaging myself where available.  Lab Results  Component Value Date   WBC 9.4 08/06/2022  HGB 12.2 08/06/2022   HCT 37.4 08/06/2022   MCV 90.8 08/06/2022   PLT 247 08/06/2022      Component Value Date/Time   NA 137 08/06/2022 1850   NA 144 04/23/2022 0853   K 3.6 08/06/2022 1850   CL 104 08/06/2022 1850   CO2 24 08/06/2022 1850   GLUCOSE 100 (H) 08/06/2022 1850   BUN 12 08/06/2022 1850   BUN 13 04/23/2022 0853   CREATININE 0.88 08/06/2022 1850   CREATININE 0.95 01/01/2016 0839   CALCIUM 9.2 08/06/2022 1850   PROT 6.4 (L) 08/06/2022 1850   PROT 7.0 07/23/2017 0847   ALBUMIN 3.3 (L) 08/06/2022 1850   ALBUMIN 4.2 07/23/2017 0847   AST 24 08/06/2022 1850   ALT 17 08/06/2022 1850   ALKPHOS 91 08/06/2022 1850   BILITOT 0.6 08/06/2022 1850   BILITOT 0.4 07/23/2017 0847   GFRNONAA >60 08/06/2022 1850   GFRAA 76 12/07/2019 0839   Lab Results  Component Value Date   CHOL 134 07/23/2017   HDL 40 07/23/2017   LDLCALC 63 07/23/2017   TRIG 153 (H) 07/23/2017   CHOLHDL 3.4 07/23/2017   Lab Results  Component Value Date   HGBA1C 5.8 (H) 03/25/2016   No results found for: "VITAMINB12" Lab Results  Component Value Date   TSH 1.469 04/16/2021    03/05/16 Dg Sniff chest - Normal diaphragmatic function.  06/12/12 MRI lumbar [I reviewed images myself and agree with interpretation. -VRP]  - No distinct cause of left hip pain is identified. - Curvature convex to the right with the apex at L2-3. - Shallow central disc herniation at L3-4 that contacts the thecal sac but does not appear to cause neural compression.  Mild facet arthropathy at this level that could conceivably be symptomatic.  - L4-5:  Bulging of the disc.  Bilateral facet  degeneration.  No apparent compressive stenosis.  The facet disease could possibly be symptomatic. - L5 S1:  Right foraminal to extraforaminal osteophyte and bulging disc could possibly irritate the right L5 nerve root.  Definite neural compression is not demonstrated however.  04/18/16 exercise testing - Exercise testing with gas exchange demonstrates normal functional capacity when compared to matched sedentary norms. There is no clear indication for cardiopulmonary limitation. At peak exercise and with flat O2 pulse pattern, patient is likely limited due to diastolic dysfunction as a result of her body habitus. Expect weight loss and regular exercise would greatly improve exercise intolerance.  04/23/16 MRI brain (without)  1.   The brain parenchyma appears normal for age 67.   There is an air fluid level within the left maxillary sinus consistent with acute sinusitis 3.   No acute findings.  03/25/16 LABS - CK, aldolase, AchR, TSH --> all normal  04/30/21 MRI brain 1.  No vestibular schwannoma or other etiology of sensorineural hearing loss identified.    2.  Nonspecific focus of enhancement in the periventricular white matter adjacent to the atrium of the right lateral ventricle. This was not clearly present on remote prior imaging from 2009. This could represent a benign entity such as a vascular structure/lesion; however, other enhancing lesions such as a subacute infarct, demyelinating disease, or metastatic lesion are also possible. Further evaluation with dedicated MRI of the brain with and without contrast is recommended.   06/14/21 MRI brain Small enhancing T2/FLAIR hyperintense lesion within the superior right periatrial white matter without substantial mass effect, grossly similar to prior. No other enhancing lesions identified. Differential considerations include subacute  infarct with metastatic lesion thought less likely given stability in the absence of a specific therapy. Recommend  follow-up MRI with and without contrast in 2-3 months to evaluate evolution.  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 No other abnormal contrast enhancement.   ASSESSMENT AND PLAN  72 y.o. year old female here with here with new onset dyspnea on exertion, shortness of breath, fatigue, since January 2018. No evidence of underlying neuromuscular disorder.  Also with history of essential tremor.  Dx: shortness of breath / fatigue (due to mild deconditioning and obesity) + ESSENTIAL TREMOR  1. Essential tremor       PLAN:  ESSENTIAL TREMOR - regarding tremor control, currently on propranolol 10mg  as needed for atrial fibrillation - continue gabapentin to 600mg  three times a day (can be refilled by Dr. Neale Burly in future) - may consider primidone as well (but interaction with xarelto) - may consider deep brain stimulator in future  MIGRAINE WITHOUT AURA - per Dr. Neale Burly (who started gabapentin)  SYNCOPE (10/02/20, March 2023) - per cardiology and PCP  Return for pending if symptoms worsen or fail to improve, return to PCP.    Suanne Marker, MD 11/18/2022, 4:29 PM Certified in Neurology, Neurophysiology and Neuroimaging  [facility] [address] [place], [place] [address] [phone]

## 2022-11-19 LAB — CUP PACEART REMOTE DEVICE CHECK
Date Time Interrogation Session: 20241027231302
Implantable Pulse Generator Implant Date: 20230524

## 2022-11-20 DIAGNOSIS — M47816 Spondylosis without myelopathy or radiculopathy, lumbar region: Secondary | ICD-10-CM | POA: Diagnosis not present

## 2022-11-20 DIAGNOSIS — M461 Sacroiliitis, not elsewhere classified: Secondary | ICD-10-CM | POA: Diagnosis not present

## 2022-11-26 ENCOUNTER — Other Ambulatory Visit (HOSPITAL_BASED_OUTPATIENT_CLINIC_OR_DEPARTMENT_OTHER): Payer: Self-pay | Admitting: Cardiovascular Disease

## 2022-11-26 ENCOUNTER — Ambulatory Visit: Payer: Medicare PPO | Attending: Cardiovascular Disease

## 2022-11-26 DIAGNOSIS — I4819 Other persistent atrial fibrillation: Secondary | ICD-10-CM | POA: Diagnosis not present

## 2022-11-27 DIAGNOSIS — H353132 Nonexudative age-related macular degeneration, bilateral, intermediate dry stage: Secondary | ICD-10-CM | POA: Diagnosis not present

## 2022-11-27 DIAGNOSIS — H35363 Drusen (degenerative) of macula, bilateral: Secondary | ICD-10-CM | POA: Diagnosis not present

## 2022-11-27 DIAGNOSIS — H40012 Open angle with borderline findings, low risk, left eye: Secondary | ICD-10-CM | POA: Diagnosis not present

## 2022-11-27 DIAGNOSIS — H35373 Puckering of macula, bilateral: Secondary | ICD-10-CM | POA: Diagnosis not present

## 2022-11-27 LAB — BASIC METABOLIC PANEL
BUN/Creatinine Ratio: 18 (ref 12–28)
BUN: 14 mg/dL (ref 8–27)
CO2: 29 mmol/L (ref 20–29)
Calcium: 9.7 mg/dL (ref 8.7–10.3)
Chloride: 104 mmol/L (ref 96–106)
Creatinine, Ser: 0.79 mg/dL (ref 0.57–1.00)
Glucose: 82 mg/dL (ref 70–99)
Potassium: 4.2 mmol/L (ref 3.5–5.2)
Sodium: 144 mmol/L (ref 134–144)
eGFR: 79 mL/min/{1.73_m2} (ref >59)

## 2022-11-27 LAB — CBC
Hematocrit: 41.4 % (ref 34.0–46.6)
Hemoglobin: 13 g/dL (ref 11.1–15.9)
MCH: 29.2 pg (ref 26.6–33.0)
MCHC: 31.4 g/dL — ABNORMAL LOW (ref 31.5–35.7)
MCV: 93 fL (ref 79–97)
Platelets: 245 10*3/uL (ref 150–450)
RBC: 4.45 x10E6/uL (ref 3.77–5.28)
RDW: 13.1 % (ref 11.7–15.4)
WBC: 6.7 10*3/uL (ref 3.4–10.8)

## 2022-11-28 ENCOUNTER — Ambulatory Visit (HOSPITAL_COMMUNITY)
Admission: RE | Admit: 2022-11-28 | Discharge: 2022-11-28 | Disposition: A | Discharge status: Home / Self Care | Payer: Medicare PPO | Source: Ambulatory Visit | Attending: Cardiovascular Disease | Admitting: Cardiovascular Disease

## 2022-11-28 DIAGNOSIS — I517 Cardiomegaly: Secondary | ICD-10-CM | POA: Diagnosis not present

## 2022-11-28 DIAGNOSIS — I4819 Other persistent atrial fibrillation: Secondary | ICD-10-CM | POA: Diagnosis not present

## 2022-11-28 DIAGNOSIS — I700 Atherosclerosis of aorta: Secondary | ICD-10-CM | POA: Diagnosis not present

## 2022-11-28 LAB — POCT I-STAT CREATININE: Creatinine, Ser: 0.9 mg/dL (ref 0.44–1.00)

## 2022-11-28 MED ORDER — IOHEXOL 350 MG/ML SOLN
95.0000 mL | Freq: Once | INTRAVENOUS | Status: AC | PRN
  Administered 2022-11-28: 95 mL via INTRAVENOUS

## 2022-12-04 NOTE — Progress Notes (Signed)
Carelink Summary Report / Loop Recorder 

## 2022-12-10 DIAGNOSIS — G4733 Obstructive sleep apnea (adult) (pediatric): Secondary | ICD-10-CM | POA: Diagnosis not present

## 2022-12-22 LAB — CUP PACEART REMOTE DEVICE CHECK
Date Time Interrogation Session: 20241129230539
Implantable Pulse Generator Implant Date: 20230524

## 2022-12-23 ENCOUNTER — Telehealth: Payer: Self-pay | Admitting: Cardiovascular Disease

## 2022-12-23 ENCOUNTER — Ambulatory Visit: Payer: Medicare PPO

## 2022-12-23 DIAGNOSIS — I4819 Other persistent atrial fibrillation: Secondary | ICD-10-CM | POA: Diagnosis not present

## 2022-12-23 NOTE — Pre-Procedure Instructions (Signed)
Spoke with husband regarding instructions.  Instructed on the following items: Arrival time 0515 Nothing to eat or drink after midnight No meds AM of procedure Responsible person to drive you home and stay with you for 24 hrs Wash with special soap night before and morning of procedure If on anti-coagulant drug instructions [name]- takes once a day, hasn't missed any doses.

## 2022-12-23 NOTE — Telephone Encounter (Signed)
Called pt and went over instructions. She wanted to verify what time she needed to arrive at the hospital. She had no further questions.

## 2022-12-23 NOTE — Telephone Encounter (Signed)
Pt called in asking if someone call her to go over ablation instructions for tomorrow

## 2022-12-24 ENCOUNTER — Ambulatory Visit (HOSPITAL_COMMUNITY)
Admission: RE | Admit: 2022-12-24 | Discharge: 2022-12-24 | Disposition: A | Discharge status: Home / Self Care | Payer: Medicare PPO | Attending: Cardiovascular Disease | Admitting: Cardiovascular Disease

## 2022-12-24 ENCOUNTER — Other Ambulatory Visit: Payer: Self-pay

## 2022-12-24 ENCOUNTER — Ambulatory Visit (HOSPITAL_BASED_OUTPATIENT_CLINIC_OR_DEPARTMENT_OTHER): Payer: Medicare PPO

## 2022-12-24 ENCOUNTER — Encounter (HOSPITAL_COMMUNITY): Payer: Self-pay | Admitting: Cardiovascular Disease

## 2022-12-24 ENCOUNTER — Encounter (HOSPITAL_COMMUNITY)
Admission: RE | Disposition: A | Discharge status: Home / Self Care | Payer: Self-pay | Source: Home / Self Care | Attending: Cardiovascular Disease

## 2022-12-24 ENCOUNTER — Ambulatory Visit (HOSPITAL_COMMUNITY): Payer: Medicare PPO

## 2022-12-24 DIAGNOSIS — Z79899 Other long term (current) drug therapy: Secondary | ICD-10-CM | POA: Diagnosis not present

## 2022-12-24 DIAGNOSIS — I483 Typical atrial flutter: Secondary | ICD-10-CM | POA: Diagnosis not present

## 2022-12-24 DIAGNOSIS — I509 Heart failure, unspecified: Secondary | ICD-10-CM | POA: Insufficient documentation

## 2022-12-24 DIAGNOSIS — N183 Chronic kidney disease, stage 3 unspecified: Secondary | ICD-10-CM | POA: Diagnosis not present

## 2022-12-24 DIAGNOSIS — I480 Paroxysmal atrial fibrillation: Secondary | ICD-10-CM | POA: Diagnosis not present

## 2022-12-24 DIAGNOSIS — I4891 Unspecified atrial fibrillation: Secondary | ICD-10-CM

## 2022-12-24 DIAGNOSIS — Z7901 Long term (current) use of anticoagulants: Secondary | ICD-10-CM | POA: Diagnosis not present

## 2022-12-24 DIAGNOSIS — I5030 Unspecified diastolic (congestive) heart failure: Secondary | ICD-10-CM | POA: Diagnosis not present

## 2022-12-24 DIAGNOSIS — I4819 Other persistent atrial fibrillation: Secondary | ICD-10-CM | POA: Diagnosis not present

## 2022-12-24 DIAGNOSIS — G4733 Obstructive sleep apnea (adult) (pediatric): Secondary | ICD-10-CM | POA: Insufficient documentation

## 2022-12-24 DIAGNOSIS — D6869 Other thrombophilia: Secondary | ICD-10-CM | POA: Diagnosis not present

## 2022-12-24 HISTORY — PX: ATRIAL FIBRILLATION ABLATION: EP1191

## 2022-12-24 LAB — POCT ACTIVATED CLOTTING TIME: Activated Clotting Time: 492 s

## 2022-12-24 SURGERY — ATRIAL FIBRILLATION ABLATION
Anesthesia: General

## 2022-12-24 MED ORDER — SODIUM CHLORIDE 0.9% FLUSH: 3.0000 mL | Freq: Two times a day (BID) | INTRAVENOUS | Status: DC

## 2022-12-24 MED ORDER — MIDAZOLAM HCL 2 MG/2ML IJ SOLN
INTRAMUSCULAR | Status: DC | PRN
  Administered 2022-12-24: 2 mg via INTRAVENOUS

## 2022-12-24 MED ORDER — FENTANYL CITRATE (PF) 100 MCG/2ML IJ SOLN
INTRAMUSCULAR | Status: AC
  Filled 2022-12-24: qty 2

## 2022-12-24 MED ORDER — FENTANYL CITRATE (PF) 250 MCG/5ML IJ SOLN
INTRAMUSCULAR | Status: DC | PRN
  Administered 2022-12-24: 60 ug via INTRAVENOUS

## 2022-12-24 MED ORDER — DEXAMETHASONE SODIUM PHOSPHATE 10 MG/ML IJ SOLN
INTRAMUSCULAR | Status: DC | PRN
  Administered 2022-12-24: 6 mg via INTRAVENOUS

## 2022-12-24 MED ORDER — OXYCODONE HCL 5 MG PO TABS: 5.0000 mg | ORAL_TABLET | Freq: Once | ORAL | Status: DC | PRN

## 2022-12-24 MED ORDER — DROPERIDOL 2.5 MG/ML IJ SOLN: 0.6250 mg | Freq: Once | INTRAMUSCULAR | Status: DC | PRN

## 2022-12-24 MED ORDER — SODIUM CHLORIDE 0.9 % IV SOLN: 250.0000 mL | INTRAVENOUS | Status: DC | PRN

## 2022-12-24 MED ORDER — ACETAMINOPHEN 325 MG PO TABS: 650.0000 mg | ORAL_TABLET | ORAL | Status: DC | PRN

## 2022-12-24 MED ORDER — ATROPINE SULFATE 1 MG/ML IV SOLN
INTRAVENOUS | Status: DC | PRN
  Administered 2022-12-24: 1 mg via INTRAVENOUS

## 2022-12-24 MED ORDER — ONDANSETRON HCL 4 MG/2ML IJ SOLN
INTRAMUSCULAR | Status: DC | PRN
  Administered 2022-12-24: 4 mg via INTRAVENOUS

## 2022-12-24 MED ORDER — SODIUM CHLORIDE 0.9% FLUSH: 3.0000 mL | INTRAVENOUS | Status: DC | PRN

## 2022-12-24 MED ORDER — HEPARIN (PORCINE) IN NACL 1000-0.9 UT/500ML-% IV SOLN
INTRAVENOUS | Status: DC | PRN
  Administered 2022-12-24 (×3): 500 mL

## 2022-12-24 MED ORDER — EPHEDRINE SULFATE-NACL 50-0.9 MG/10ML-% IV SOSY
PREFILLED_SYRINGE | INTRAVENOUS | Status: DC | PRN
  Administered 2022-12-24 (×3): 5 mg via INTRAVENOUS

## 2022-12-24 MED ORDER — SUGAMMADEX SODIUM 200 MG/2ML IV SOLN
INTRAVENOUS | Status: DC | PRN
  Administered 2022-12-24: 200 mg via INTRAVENOUS

## 2022-12-24 MED ORDER — ACETAMINOPHEN 160 MG/5ML PO SOLN: 325.0000 mg | ORAL | Status: DC | PRN

## 2022-12-24 MED ORDER — OXYCODONE HCL 5 MG/5ML PO SOLN
5.0000 mg | Freq: Once | ORAL | Status: DC | PRN
Start: 2022-12-24 — End: 2022-12-24

## 2022-12-24 MED ORDER — MIDAZOLAM HCL 5 MG/5ML IJ SOLN
INTRAMUSCULAR | Status: AC
  Filled 2022-12-24: qty 5

## 2022-12-24 MED ORDER — ONDANSETRON HCL 4 MG/2ML IJ SOLN: 4.0000 mg | Freq: Four times a day (QID) | INTRAMUSCULAR | Status: DC | PRN

## 2022-12-24 MED ORDER — ACETAMINOPHEN 325 MG PO TABS: 325.0000 mg | ORAL_TABLET | ORAL | Status: DC | PRN

## 2022-12-24 MED ORDER — HEPARIN SODIUM (PORCINE) 1000 UNIT/ML IJ SOLN
INTRAMUSCULAR | Status: DC | PRN
  Administered 2022-12-24: 16000 [IU] via INTRAVENOUS

## 2022-12-24 MED ORDER — SODIUM CHLORIDE 0.9 % IV SOLN: INTRAVENOUS | Status: DC

## 2022-12-24 MED ORDER — ACETAMINOPHEN 10 MG/ML IV SOLN: 1000.0000 mg | Freq: Once | INTRAVENOUS | Status: DC | PRN

## 2022-12-24 MED ORDER — ATROPINE SULFATE 1 MG/10ML IJ SOSY
PREFILLED_SYRINGE | INTRAMUSCULAR | Status: AC
Start: 2022-12-24 — End: ?
  Filled 2022-12-24: qty 10

## 2022-12-24 MED ORDER — FENTANYL CITRATE (PF) 100 MCG/2ML IJ SOLN: 25.0000 ug | INTRAMUSCULAR | Status: DC | PRN

## 2022-12-24 MED ORDER — ROCURONIUM BROMIDE 10 MG/ML (PF) SYRINGE
PREFILLED_SYRINGE | INTRAVENOUS | Status: DC | PRN
  Administered 2022-12-24: 50 mg via INTRAVENOUS

## 2022-12-24 MED ORDER — PROTAMINE SULFATE 10 MG/ML IV SOLN
INTRAVENOUS | Status: DC | PRN
Start: 2022-12-24 — End: 2022-12-24
  Administered 2022-12-24: 50 mg via INTRAVENOUS

## 2022-12-24 MED ORDER — LIDOCAINE 2% (20 MG/ML) 5 ML SYRINGE
INTRAMUSCULAR | Status: DC | PRN
  Administered 2022-12-24: 60 mg via INTRAVENOUS

## 2022-12-24 MED ORDER — PROPOFOL 10 MG/ML IV BOLUS
INTRAVENOUS | Status: DC | PRN
  Administered 2022-12-24: 130 mg via INTRAVENOUS

## 2022-12-24 SURGICAL SUPPLY — 21 items
BLANKET WARM UNDERBOD FULL ACC (MISCELLANEOUS) ×1 IMPLANT
CABLE PFA RX CATH CONN (CABLE) IMPLANT
CATH 8FR REPROCESSED SOUNDSTAR (CATHETERS) ×1 IMPLANT
CATH 8FR SOUNDSTAR REPROCESSED (CATHETERS) IMPLANT
CATH FARAWAVE ABLATION 31 (CATHETERS) IMPLANT
CATH OCTARAY 2.0 F 3-3-3-3-3 (CATHETERS) IMPLANT
CATH WEBSTER BI DIR CS D-F CRV (CATHETERS) IMPLANT
CLOSURE PERCLOSE PROSTYLE (VASCULAR PRODUCTS) IMPLANT
COVER SWIFTLINK CONNECTOR (BAG) ×1 IMPLANT
DEVICE CLOSURE MYNXGRIP 6/7F (Vascular Products) IMPLANT
DILATOR VESSEL 38 20CM 16FR (INTRODUCER) IMPLANT
GUIDEWIRE INQWIRE 1.5J.035X260 (WIRE) IMPLANT
INQWIRE 1.5J .035X260CM (WIRE) ×1 IMPLANT
PACK EP LF (CUSTOM PROCEDURE TRAY) ×1 IMPLANT
PAD DEFIB RADIO PHYSIO CONN (PAD) ×1 IMPLANT
PATCH CARTO3 (PAD) IMPLANT
SHEATH FARADRIVE STEERABLE (SHEATH) IMPLANT
SHEATH PINNACLE 8F 10CM (SHEATH) IMPLANT
SHEATH PINNACLE 9F 10CM (SHEATH) IMPLANT
SHEATH PROBE COVER 6X72 (BAG) IMPLANT
SHEATH WIRE KIT BAYLIS SL1 (KITS) IMPLANT

## 2022-12-24 NOTE — Anesthesia Procedure Notes (Signed)
Procedure Name: Intubation Date/Time: 12/24/2022 8:01 AM  Performed by: April Holding, CRNAPre-anesthesia Checklist: Patient identified, Emergency Drugs available, Suction available and Patient being monitored Patient Re-evaluated:Patient Re-evaluated prior to induction Oxygen Delivery Method: Circle System Utilized Preoxygenation: Pre-oxygenation with 100% oxygen Induction Type: IV induction Ventilation: Mask ventilation without difficulty Laryngoscope Size: Miller and 2 Grade View: Grade I Tube type: Oral Tube size: 7.0 mm Number of attempts: 1 Airway Equipment and Method: Stylet and Oral airway Placement Confirmation: ETT inserted through vocal cords under direct vision, positive ETCO2 and breath sounds checked- equal and bilateral Secured at: 22 cm Tube secured with: Tape Dental Injury: Teeth and Oropharynx as per pre-operative assessment

## 2022-12-24 NOTE — Transfer of Care (Signed)
Immediate Anesthesia Transfer of Care Note  Patient: [name]  Procedure(s) Performed: ATRIAL FIBRILLATION ABLATION  Patient Location: Cath Lab  Anesthesia Type:General  Level of Consciousness: awake, alert , and oriented  Airway & Oxygen Therapy: Patient Spontanous Breathing  Post-op Assessment: Report given to RN and Post -op Vital signs reviewed and stable  Post vital signs: Reviewed and stable  Last Vitals:  Vitals Value Taken Time  BP    Temp    Pulse 81 12/24/22 0944  Resp 17 12/24/22 0944  SpO2 97 % 12/24/22 0944  Vitals shown include unfiled device data.  Last Pain:  Vitals:   12/24/22 0622  TempSrc: Oral  PainSc: 0-No pain         Complications: No notable events documented.

## 2022-12-24 NOTE — Anesthesia Preprocedure Evaluation (Signed)
Anesthesia Evaluation  Patient identified by MRN, date of birth, ID band Patient awake    Reviewed: Allergy & Precautions, NPO status , Patient's Chart, lab work & pertinent test results  Airway Mallampati: III  TM Distance: >3 FB Neck ROM: Full    Dental  (+) Teeth Intact, Dental Advisory Given   Pulmonary sleep apnea    breath sounds clear to auscultation       Cardiovascular +CHF  + dysrhythmias Atrial Fibrillation  Rhythm:Irregular Rate:Normal     Neuro/Psych  Headaches    GI/Hepatic negative GI ROS, Neg liver ROS,,,  Endo/Other  negative endocrine ROS    Renal/GU Renal disease     Musculoskeletal   Abdominal   Peds  Hematology negative hematology ROS (+)   Anesthesia Other Findings   Reproductive/Obstetrics                             Anesthesia Physical Anesthesia Plan  ASA: 3  Anesthesia Plan: General   Post-op Pain Management: Tylenol PO (pre-op)*   Induction: Intravenous  PONV Risk Score and Plan: 4 or greater and Ondansetron, Dexamethasone and Treatment may vary due to age or medical condition  Airway Management Planned: Oral ETT  Additional Equipment: None  Intra-op Plan:   Post-operative Plan: Extubation in OR  Informed Consent: I have reviewed the patients History and Physical, chart, labs and discussed the procedure including the risks, benefits and alternatives for the proposed anesthesia with the patient or authorized representative who has indicated his/her understanding and acceptance.     Dental advisory given  Plan Discussed with: CRNA  Anesthesia Plan Comments:        Anesthesia Quick Evaluation

## 2022-12-24 NOTE — Anesthesia Postprocedure Evaluation (Signed)
Anesthesia Post Note  Patient: [name]  Procedure(s) Performed: ATRIAL FIBRILLATION ABLATION     Patient location during evaluation: PACU Anesthesia Type: General Level of consciousness: awake and alert Pain management: pain level controlled Vital Signs Assessment: post-procedure vital signs reviewed and stable Respiratory status: spontaneous breathing, nonlabored ventilation, respiratory function stable and patient connected to nasal cannula oxygen Cardiovascular status: blood pressure returned to baseline and stable Postop Assessment: no apparent nausea or vomiting Anesthetic complications: no  No notable events documented.  Last Vitals:  Vitals:   12/24/22 1235 12/24/22 1245  BP: (!) 113/59 118/69  Pulse: 62 68  Resp: 11 20  Temp:    SpO2: 97% 98%    Last Pain:  Vitals:   12/24/22 0950  TempSrc: Oral  PainSc:    Pain Goal: Patients Stated Pain Goal: 0 (12/24/22 0947)                 Shelton Silvas

## 2022-12-24 NOTE — Discharge Instructions (Signed)

## 2022-12-24 NOTE — H&P (Signed)
Electrophysiology Office Note:    Date:  12/24/2022   ID:  [name], DOB 02/24/1950, MRN [id number]  PCP:  [name] Corn, MD   [place] [name] Cardiologist:  Kristeen Miss, MD Electrophysiologist:  [name] Small, MD     Referring MD: No ref. provider found   History of Present Illness:    [name] is a 72 y.o. female with a hx listed below, significant for paroxysmal atrial fibrillation, referred for arrhythmia management.     She has a history of atrial fibrillation managed with Tikosyn. She had breakthrough episodes of AF and flutter.  Linq interrogation today shows a 2% arrhythmia burden, the majority of which appears to be an atrial flutter.   She is very symptomatic with AF - very fatigued, short of breath.  She underwent ablation of atrial ablation and flutter on May 06, 2022.  The procedure was somewhat difficult --the CTI line was difficult, and she required cardioversion twice during the procedure.      Since her ablation, she has had a few episodes of arrhythmia.  She has continued to have episodes since our last clinic visit but they have not been as bad as what she was experiencing over the summer.  I reviewed the patient's CT and labs. There was no LAA thrombus. she  has not missed any doses of anticoagulation, and she took her dose last night. There have been no changes in the patient's diagnoses, medications, or condition since our recent clinic visit.      EKGs/Labs/Other Studies Reviewed Today:     ILR report reviewed today - < 0.2% AF   Recent Labs: 06/03/2022: Magnesium 2.0 08/06/2022: ALT 17 11/26/2022: BUN 14; Hemoglobin 13.0; Platelets 245; Potassium 4.2; Sodium 144 11/28/2022: Creatinine, Ser 0.90     Physical Exam:    VS:  BP 117/76   Pulse (!) 57   Temp 98.4 F (36.9 C) (Oral)   Resp 18   Ht 5\' 9"  (1.753 m)   Wt 83.9 kg   LMP 01/21/1990   SpO2 98%   BMI 27.32 kg/m     Wt Readings from Last 3  Encounters:  12/24/22 83.9 kg  11/18/22 85.3 kg  08/30/22 85.4 kg     GEN:  Well nourished, well developed in no acute distress CARDIAC: RRR, no murmurs, rubs, gallops RESPIRATORY:  Normal work of breathing MUSCULOSKELETAL: no edema    ASSESSMENT & PLAN:    Atrial fibrillation and typical flutter:  She is very symptomatic.  She underwent ablation of atrial fibrillation and flutter on May 06, 2022 She has had 2 recurrences of atrial flutter after the blinding.  From her ablation We discussed management options.  I recommended repeat EP study to see if there is breakthrough from the prior ablation Continue dofetilide  We discussed the indication, rationale, logistics, anticipated benefits, and potential risks of the ablation procedure including but not limited to -- bleed at the groin access site, chest pain, damage to nearby organs such as the diaphragm, lungs, or esophagus, need for a drainage tube, or prolonged hospitalization. I explained that the risk for stroke, heart attack, need for open chest surgery, or even death is very low but not zero. she  expressed understanding and wishes to proceed.   Secondary hypercoagulable state:  CHADS2Vasc scor of 3. Continue xarelto 20mg  daily.  Medtronic Linq in place:   OSA: continue CPAP        Medication Adjustments/Labs and Tests Ordered: Current medicines  are reviewed at length with the patient today.  Concerns regarding medicines are outlined above.  Orders Placed This Encounter  Procedures   Informed Consent Details: Physician/Practitioner Attestation; Transcribe to consent form and obtain patient signature   Initiate Pre-op Protocol   Void on call to EP Lab   Confirm CBC and BMP (or CMP) results within 7 days for inpatient and 30 days for outpatient:   Clip right and left femoral area PM before surgery   Clip right internal jugular area PM before surgery   Pre-admission testing diagnosis   EP STUDY   Insert peripheral  IV   Meds ordered this encounter  Medications   0.9 %  sodium chloride infusion     Signed, [name] Small, MD  12/24/2022 7:19 AM    [name]

## 2022-12-26 MED FILL — Fentanyl Citrate Preservative Free (PF) Inj 100 MCG/2ML: INTRAMUSCULAR | Qty: 1.2 | Status: AC

## 2022-12-26 MED FILL — Midazolam HCl Inj 5 MG/5ML (Base Equivalent): INTRAMUSCULAR | Qty: 2 | Status: AC

## 2022-12-31 ENCOUNTER — Ambulatory Visit (HOSPITAL_COMMUNITY)
Admission: RE | Admit: 2022-12-31 | Discharge: 2022-12-31 | Disposition: A | Discharge status: Home / Self Care | Payer: Medicare PPO | Source: Ambulatory Visit | Attending: Physician Assistant | Admitting: Physician Assistant

## 2022-12-31 DIAGNOSIS — R609 Edema, unspecified: Secondary | ICD-10-CM

## 2022-12-31 DIAGNOSIS — I97638 Postprocedural hematoma of a circulatory system organ or structure following other circulatory system procedure: Secondary | ICD-10-CM | POA: Diagnosis not present

## 2022-12-31 NOTE — Progress Notes (Signed)
Patient presents for groin check post ablation. She started having swelling and tenderness at her left groin cath site day 3 post ablation. The swelling is roughly the size of a golf ball and is tender to touch today. Swelling is not pulsatile and no bruits appreciated. Patient sent for vascular ultrasound which showed no evidence of pseudoaneurysm, AVF or DVT. Small hematoma in area of concern measuring 2.1 x 2.0 x 1.5 cm. Patient sent home with extended groin precautions and to call back if symptoms worsen.   Follow up in the AF clinic on 12/31 as scheduled.

## 2023-01-06 ENCOUNTER — Telehealth: Payer: Self-pay | Admitting: Cardiovascular Disease

## 2023-01-06 NOTE — Telephone Encounter (Signed)
   Pre-operative Risk Assessment    Patient Name: [name]  DOB: [birth year] MRN: [id number]      Request for Surgical Clearance    Procedure:   teeth cleaning  Date of Surgery:  Clearance 01/06/23                                  Surgeon:  Dr Trudee Kuster Group or Practice Name:  [facility] Phone number:  [phone] Fax number:  [phone]   Type of Clearance Requested:   - Medical  - Pharmacy:  Hold        Type of Anesthesia:  None    Additional requests/questions:      SignedFilomena Jungling   01/06/2023, 9:14 AM

## 2023-01-06 NOTE — Telephone Encounter (Signed)
   Patient Name: [name]  DOB: [birth year] MRN: [id number]  Primary Cardiologist: Kristeen Miss, MD  Chart reviewed as part of pre-operative protocol coverage.   Simple dental extractions (i.e. 1-2 teeth),cleanings are considered low risk procedures per guidelines and generally do not require any specific cardiac clearance. It is also generally accepted that for simple extractions and dental cleanings, there is no need to interrupt blood thinner therapy.   SBE prophylaxis is not required for the patient from a cardiac standpoint.  I will route this recommendation to the requesting party via Epic fax function and remove from pre-op pool.  Please call with questions.  Joylene Grapes, NP 01/06/2023, 9:32 AM

## 2023-01-21 ENCOUNTER — Ambulatory Visit (HOSPITAL_COMMUNITY)
Admit: 2023-01-21 | Discharge: 2023-01-21 | Disposition: A | Discharge status: Home / Self Care | Payer: Medicare PPO | Attending: Physician Assistant | Admitting: Physician Assistant

## 2023-01-21 VITALS — BP 130/72 | HR 55 | Ht 69.0 in | Wt 189.6 lb

## 2023-01-21 DIAGNOSIS — Z5181 Encounter for therapeutic drug level monitoring: Secondary | ICD-10-CM

## 2023-01-21 DIAGNOSIS — G4733 Obstructive sleep apnea (adult) (pediatric): Secondary | ICD-10-CM | POA: Diagnosis not present

## 2023-01-21 DIAGNOSIS — E785 Hyperlipidemia, unspecified: Secondary | ICD-10-CM | POA: Insufficient documentation

## 2023-01-21 DIAGNOSIS — I5032 Chronic diastolic (congestive) heart failure: Secondary | ICD-10-CM | POA: Insufficient documentation

## 2023-01-21 DIAGNOSIS — I4819 Other persistent atrial fibrillation: Secondary | ICD-10-CM

## 2023-01-21 DIAGNOSIS — D6869 Other thrombophilia: Secondary | ICD-10-CM | POA: Diagnosis not present

## 2023-01-21 DIAGNOSIS — Z7901 Long term (current) use of anticoagulants: Secondary | ICD-10-CM | POA: Diagnosis not present

## 2023-01-21 DIAGNOSIS — I4892 Unspecified atrial flutter: Secondary | ICD-10-CM | POA: Diagnosis not present

## 2023-01-21 DIAGNOSIS — Z79899 Other long term (current) drug therapy: Secondary | ICD-10-CM

## 2023-01-21 DIAGNOSIS — R002 Palpitations: Secondary | ICD-10-CM | POA: Diagnosis not present

## 2023-01-21 LAB — MAGNESIUM: Magnesium: 2.1 mg/dL (ref 1.7–2.4)

## 2023-01-21 NOTE — Progress Notes (Signed)
 Primary Care Physician: Onita Rush, MD Primary Cardiologist: Dr. Alveta Primary Electrophysiologist: Dr. Nancey Referring Physician: Dr. Nancey Quale [name] is a 72 y.o. female with a history of OSA on CPAP, tremor, HLD, diastolic congestive heart failure, coronary artery calcium  score 0 on 02/25/2018, migraines, and atrial fibrillation who presents for consultation in the [facility]. She is s/p Afib and atrial flutter ablation on 05/06/22 by Dr. Nancey. She is on Tikosyn  375 mcg BID and was having breakthrough episodes of Afib feeling very symptomatic. Patient is on Xarelto  20 mg daily for a CHADS2VASC score of 3.  Patient is s/p Afib and atrial flutter ablation on 05/06/22 by Dr. Nancey. Unfortunately, she continued to have symptomatic episodes and underwent repeat afib ablation on 12/24/22. She presented to the clinic 12/10 with swelling and tenderness at the L groin site. Ultrasound showed a small hematoma, no pseudoaneurysm.   On follow up today, patient reports that she has done well since her last visit with no symptoms of afib. Her left groin site still has a small amount of swelling but has greatly improved.   Today, she denies symptoms of palpitations, chest pain, orthopnea, PND, lower extremity edema, dizziness, presyncope, syncope, daytime somnolence, bleeding, or neurologic sequela. The patient is tolerating medications without difficulties and is otherwise without complaint today.   Atrial Fibrillation Risk Factors:  she does have symptoms or diagnosis of sleep apnea. she is compliant with CPAP therapy. she does not have a history of rheumatic fever. she does not have a history of alcohol  use. The patient does not have a history of early familial atrial fibrillation or other arrhythmias.   Atrial Fibrillation Management history:  Previous antiarrhythmic drugs: Tikosyn  Previous cardioversions: remotely  Previous ablations: 05/06/22,  12/24/22 Anticoagulation history: Xarelto  20 mg daily   Past Medical History:  Diagnosis Date   Atrial fibrillation (HCC)    Back pain    nerve problems-had series of three injections   Bone spur    Spine   Chronic anticoagulation    Diastolic congestive heart failure (HCC)    Hearing loss    History of shingles    Hyperlipemia    Migraine headache    Obstructive sleep apnea    uses CPAP   OSA on CPAP    Paroxysmal atrial fibrillation (HCC)    Restrictive cardiomyopathy (HCC)    Tremor    Tubal pregnancy     Current Outpatient Medications  Medication Sig Dispense Refill   atorvastatin  (LIPITOR) 20 MG tablet TAKE 1 TABLET EVERY DAY AT 6PM 90 tablet 3   Cholecalciferol (VITAMIN D-3 PO) Take 5,000 Units by mouth daily with supper.     Cyanocobalamin  (VITAMIN B-12) 5000 MCG SUBL Take 5,000 mcg by mouth daily with supper.     dofetilide  (TIKOSYN ) 125 MCG capsule TAKE 3 CAPSULES TWICE DAILY 540 capsule 3   ezetimibe  (ZETIA ) 10 MG tablet TAKE 1 TABLET EVERY DAY 30 tablet 11   gabapentin  (NEURONTIN ) 600 MG tablet Take 1 tablet (600 mg total) by mouth 3 (three) times daily. 90 tablet 6   Multiple Vitamins-Minerals (PRESERVISION AREDS 2 PO) Take 1 capsule by mouth 2 (two) times daily. With breakfast & with supper     NONFORMULARY OR COMPOUNDED ITEM Place 1 spray into both nostrils 2 (two) times daily as needed (migraine headaches.). Lidocaine  Isotonic Nasal Spray 4% Compounded via [facility]     Polyethyl Glycol-Propyl Glycol (SYSTANE) 0.4-0.3 % SOLN Place 1  drop into both eyes in the morning and at bedtime.     propranolol  (INDERAL ) 10 MG tablet Take 1 tablet (10 mg total) by mouth every 6 (six) hours as needed (for palpitations). (Patient taking differently: Take 10 mg by mouth as needed (for palpitations).)     spironolactone  (ALDACTONE ) 25 MG tablet TAKE 1 TABLET EVERY DAY 90 tablet 3   XARELTO  20 MG TABS tablet TAKE 1 TABLET EVERY DAY WITH SUPPER 90 tablet 3   No current  facility-administered medications for this encounter.    ROS- All systems are reviewed and negative except as per the HPI above.  Physical Exam: Vitals:   01/21/23 1124  BP: 130/72  Pulse: (!) 55  Weight: 86 kg  Height: 5' 9 (1.753 m)    GEN: Well nourished, well developed in no acute distress NECK: No JVD; No carotid bruits CARDIAC: Regular rate and rhythm, no murmurs, rubs, gallops RESPIRATORY:  Clear to auscultation without rales, wheezing or rhonchi  ABDOMEN: Soft, non-tender, non-distended EXTREMITIES:  No edema; No deformity    Wt Readings from Last 3 Encounters:  01/21/23 86 kg  12/24/22 83.9 kg  11/18/22 85.3 kg    EKG today demonstrates  SB Vent. rate 55 BPM PR interval 142 ms QRS duration 72 ms QT/QTcB 426/407 ms   Echo 04/17/21 demonstrated:  1. Left ventricular ejection fraction, by estimation, is 55 to 60%. The  left ventricle has normal function. Left ventricular endocardial border  not optimally defined to evaluate regional wall motion. Left ventricular  diastolic function could not be evaluated.   2. Right ventricular systolic function is normal. The right ventricular  size is mildly enlarged. There is normal pulmonary artery systolic  pressure. The estimated right ventricular systolic pressure is 25.1 mmHg.   3. The mitral valve is grossly normal. No evidence of mitral valve  regurgitation. No evidence of mitral stenosis.   4. The aortic valve is tricuspid. Aortic valve regurgitation is not  visualized. No aortic stenosis is present.   5. The inferior vena cava is normal in size with greater than 50%  respiratory variability, suggesting right atrial pressure of 3 mmHg.  Epic records are reviewed at length today.  CHA2DS2-VASc Score = 3  [name] score is based upon: CHF History: 1 HTN History: 0 Diabetes History: 0 Stroke History: 0 Vascular Disease History: 0 Age Score: 1 Gender Score: 1       ASSESSMENT AND PLAN: Persistent  Atrial Fibrillation/Atrial flutter [name] CHA2DS2-VASc score is 3, indicating a 3.2% annual risk of stroke.   S/p Afib and atrial flutter ablation on 05/06/22 with repeat ablation on 12/24/22. Patient appears to be maintaining SR Continue dofetilide  375 mcg BID (125 mg x 3). QT stable Recent bmet reviewed, check magnesium today Continue Xarelto  20 mg daily with no missed doses for 3 months post ablation.  Continue propranolol  10 mg q 6 hours for palpitations.  Secondary Hypercoagulable State (ICD10:  D68.69) The patient is at significant risk for stroke/thromboembolism based upon her CHA2DS2-VASc Score of 3.  Continue Rivaroxaban  (Xarelto ).   OSA  Encouraged nightly CPAP Followed by Dr Jude  Chronic diastolic CHF Fluid status appears stable   Follow up with Dr Nancey as scheduled.    Daril Kicks PA-C Afib Clinic [facility] [address] [place], [place] [address] [phone] 01/21/2023 1:16 PM

## 2023-01-23 ENCOUNTER — Telehealth: Payer: Self-pay | Admitting: Cardiovascular Disease

## 2023-01-23 NOTE — Telephone Encounter (Signed)
  1. Has your device fired? no  2. Is you device beeping? no  3. Are you experiencing draining or swelling at device site? no  4. Are you calling to see if we received your device transmission? Patient states she got a text stating transmission wasn't received or device is having trouble communicating.   5. Have you passed out? no   Please route to Device Clinic Pool

## 2023-01-24 NOTE — Telephone Encounter (Signed)
 I spoke with the pt and her monitor is now up to date.

## 2023-01-24 NOTE — Telephone Encounter (Signed)
 LMOVM for pt to call to receive help with her Carelink app.

## 2023-01-26 LAB — CUP PACEART REMOTE DEVICE CHECK
Date Time Interrogation Session: 20250103230627
Implantable Pulse Generator Implant Date: 20230524

## 2023-01-27 ENCOUNTER — Ambulatory Visit (INDEPENDENT_AMBULATORY_CARE_PROVIDER_SITE_OTHER): Payer: Medicare PPO

## 2023-01-27 DIAGNOSIS — I4819 Other persistent atrial fibrillation: Secondary | ICD-10-CM

## 2023-01-27 DIAGNOSIS — D692 Other nonthrombocytopenic purpura: Secondary | ICD-10-CM | POA: Diagnosis not present

## 2023-01-27 DIAGNOSIS — D2372 Other benign neoplasm of skin of left lower limb, including hip: Secondary | ICD-10-CM | POA: Diagnosis not present

## 2023-01-27 DIAGNOSIS — D2362 Other benign neoplasm of skin of left upper limb, including shoulder: Secondary | ICD-10-CM | POA: Diagnosis not present

## 2023-01-27 DIAGNOSIS — Z85828 Personal history of other malignant neoplasm of skin: Secondary | ICD-10-CM | POA: Diagnosis not present

## 2023-01-27 DIAGNOSIS — D2371 Other benign neoplasm of skin of right lower limb, including hip: Secondary | ICD-10-CM | POA: Diagnosis not present

## 2023-01-27 DIAGNOSIS — D2262 Melanocytic nevi of left upper limb, including shoulder: Secondary | ICD-10-CM | POA: Diagnosis not present

## 2023-01-27 DIAGNOSIS — D2261 Melanocytic nevi of right upper limb, including shoulder: Secondary | ICD-10-CM | POA: Diagnosis not present

## 2023-01-28 DIAGNOSIS — M461 Sacroiliitis, not elsewhere classified: Secondary | ICD-10-CM | POA: Diagnosis not present

## 2023-02-26 DIAGNOSIS — H40023 Open angle with borderline findings, high risk, bilateral: Secondary | ICD-10-CM | POA: Diagnosis not present

## 2023-02-26 DIAGNOSIS — H2513 Age-related nuclear cataract, bilateral: Secondary | ICD-10-CM | POA: Diagnosis not present

## 2023-03-03 ENCOUNTER — Ambulatory Visit (INDEPENDENT_AMBULATORY_CARE_PROVIDER_SITE_OTHER): Payer: Medicare PPO

## 2023-03-03 DIAGNOSIS — E538 Deficiency of other specified B group vitamins: Secondary | ICD-10-CM | POA: Diagnosis not present

## 2023-03-03 DIAGNOSIS — I4819 Other persistent atrial fibrillation: Secondary | ICD-10-CM

## 2023-03-03 DIAGNOSIS — E785 Hyperlipidemia, unspecified: Secondary | ICD-10-CM | POA: Diagnosis not present

## 2023-03-03 LAB — CUP PACEART REMOTE DEVICE CHECK
Date Time Interrogation Session: 20250209231419
Implantable Pulse Generator Implant Date: 20230524

## 2023-03-05 NOTE — Progress Notes (Signed)
Carelink Summary Report / Loop Recorder

## 2023-03-06 DIAGNOSIS — G4733 Obstructive sleep apnea (adult) (pediatric): Secondary | ICD-10-CM | POA: Diagnosis not present

## 2023-03-10 DIAGNOSIS — D6869 Other thrombophilia: Secondary | ICD-10-CM | POA: Diagnosis not present

## 2023-03-10 DIAGNOSIS — Z0000 Encounter for general adult medical examination without abnormal findings: Secondary | ICD-10-CM | POA: Diagnosis not present

## 2023-03-10 DIAGNOSIS — G4733 Obstructive sleep apnea (adult) (pediatric): Secondary | ICD-10-CM | POA: Diagnosis not present

## 2023-03-10 DIAGNOSIS — E785 Hyperlipidemia, unspecified: Secondary | ICD-10-CM | POA: Diagnosis not present

## 2023-03-10 DIAGNOSIS — R82998 Other abnormal findings in urine: Secondary | ICD-10-CM | POA: Diagnosis not present

## 2023-03-10 DIAGNOSIS — F418 Other specified anxiety disorders: Secondary | ICD-10-CM | POA: Diagnosis not present

## 2023-03-10 DIAGNOSIS — J984 Other disorders of lung: Secondary | ICD-10-CM | POA: Diagnosis not present

## 2023-03-10 DIAGNOSIS — I480 Paroxysmal atrial fibrillation: Secondary | ICD-10-CM | POA: Diagnosis not present

## 2023-03-10 DIAGNOSIS — R251 Tremor, unspecified: Secondary | ICD-10-CM | POA: Diagnosis not present

## 2023-03-10 DIAGNOSIS — N1831 Chronic kidney disease, stage 3a: Secondary | ICD-10-CM | POA: Diagnosis not present

## 2023-03-17 DIAGNOSIS — M461 Sacroiliitis, not elsewhere classified: Secondary | ICD-10-CM | POA: Diagnosis not present

## 2023-03-17 DIAGNOSIS — Z6827 Body mass index (BMI) 27.0-27.9, adult: Secondary | ICD-10-CM | POA: Diagnosis not present

## 2023-03-17 DIAGNOSIS — M47816 Spondylosis without myelopathy or radiculopathy, lumbar region: Secondary | ICD-10-CM | POA: Diagnosis not present

## 2023-03-18 ENCOUNTER — Encounter: Payer: Self-pay | Admitting: Cardiovascular Disease

## 2023-03-24 ENCOUNTER — Ambulatory Visit: Payer: Medicare PPO | Attending: Cardiovascular Disease | Admitting: Cardiovascular Disease

## 2023-03-24 ENCOUNTER — Encounter: Payer: Self-pay | Admitting: Cardiovascular Disease

## 2023-03-24 VITALS — BP 120/84 | HR 62 | Ht 69.0 in | Wt 186.0 lb

## 2023-03-24 DIAGNOSIS — I483 Typical atrial flutter: Secondary | ICD-10-CM | POA: Diagnosis not present

## 2023-03-24 DIAGNOSIS — I4819 Other persistent atrial fibrillation: Secondary | ICD-10-CM

## 2023-03-24 NOTE — Patient Instructions (Signed)
 Medication Instructions:  STOP Tikosyn  *If you need a refill on your cardiac medications before your next appointment, please call your pharmacy*   Follow-Up: At [facility], you and your health needs are our priority.  As part of our continuing mission to provide you with exceptional heart care, we have created designated Provider Care Teams.  These Care Teams include your primary Cardiologist (physician) and Advanced Practice Providers (APPs -  Physician Assistants and Nurse Practitioners) who all work together to provide you with the care you need, when you need it.  We recommend signing up for the patient portal called "MyChart".  Sign up information is provided on this After Visit Summary.  MyChart is used to connect with patients for Virtual Visits (Telemedicine).  Patients are able to view lab/test results, encounter notes, upcoming appointments, etc.  Non-urgent messages can be sent to your provider as well.   To learn more about what you can do with MyChart, go to [email or web address].    Your next appointment:   6 month(s)  Provider:   York Pellant, MD

## 2023-03-24 NOTE — Progress Notes (Signed)
  Electrophysiology Office Note:    Date:  03/24/2023   ID:  [name], DOB Mar 04, 1950, MRN [id number]  PCP:  Creola Corn, MD   [facility] Providers Cardiologist:  Kristeen Miss, MD Electrophysiologist:  Maurice Small, MD     Referring MD: Creola Corn, MD   History of Present Illness:    [name] is a 73 y.o. female with a hx listed below, significant for paroxysmal atrial fibrillation, referred for arrhythmia management.     She has a history of atrial fibrillation managed with Tikosyn. She had breakthrough episodes of AF and flutter.  Linq interrogation today shows a 2% arrhythmia burden, the majority of which appears to be an atrial flutter.   She is very symptomatic with AF - very fatigued, short of breath.  She underwent ablation of atrial ablation and flutter on May 06, 2022.  The procedure was somewhat difficult --the CTI line was difficult, and she required cardioversion twice during the procedure.  Due to recurrence of atrial fibrillation, she underwent repeat ablation with pulsed field energy on December 24, 2022.  This showed that there was some reconnection of the right veins.  This CTI was persistently blocked from the prior ablation.      Since her repeat ablation in December, she has done very well.  Her loop recorder has not detected any A-fib events, and she has remained free of symptoms to suggest recurrence.     EKGs/Labs/Other Studies Reviewed Today:     ILR report reviewed today - < 0.2% AF   Recent Labs: 08/06/2022: ALT 17 11/26/2022: BUN 14; Hemoglobin 13.0; Platelets 245; Potassium 4.2; Sodium 144 11/28/2022: Creatinine, Ser 0.90 01/21/2023: Magnesium 2.1     Physical Exam:    VS:  BP 120/84 (BP Location: Left Arm, Patient Position: Sitting, Cuff Size: Normal)   Pulse 62   Ht 5\' 9"  (1.753 m)   Wt 186 lb (84.4 kg)   LMP 01/21/1990   SpO2 97%   BMI 27.47 kg/m     Wt Readings from Last 3 Encounters:   03/24/23 186 lb (84.4 kg)  01/21/23 189 lb 9.6 oz (86 kg)  12/24/22 185 lb (83.9 kg)     GEN:  Well nourished, well developed in no acute distress CARDIAC: RRR, no murmurs, rubs, gallops RESPIRATORY:  Normal work of breathing MUSCULOSKELETAL: no edema    ASSESSMENT & PLAN:    Atrial fibrillation and typical flutter:  She is very symptomatic.  She underwent ablation of atrial fibrillation and flutter on May 06, 2022 and repeat ablation December 2024 Since the ablation, she has not had any recurrence of atrial fibrillation Using a shared decision-making approach, we decided to discontinue dofetilide at this time.   Secondary hypercoagulable state:  CHADS2Vasc scor of 3. Continue xarelto 20mg  daily.  [employer] Linq in place:  Continue monitoring  OSA: continue CPAP        Medication Adjustments/Labs and Tests Ordered: Current medicines are reviewed at length with the patient today.  Concerns regarding medicines are outlined above.  Orders Placed This Encounter  Procedures   EKG 12-Lead   No orders of the defined types were placed in this encounter.    Signed, Maurice Small, MD  03/24/2023 2:01 PM    [facility]

## 2023-03-25 DIAGNOSIS — G43019 Migraine without aura, intractable, without status migrainosus: Secondary | ICD-10-CM | POA: Diagnosis not present

## 2023-04-07 ENCOUNTER — Ambulatory Visit: Payer: Medicare PPO

## 2023-04-07 DIAGNOSIS — I4819 Other persistent atrial fibrillation: Secondary | ICD-10-CM

## 2023-04-07 NOTE — Progress Notes (Signed)
 Carelink Summary Report / Loop Recorder

## 2023-04-14 LAB — CUP PACEART REMOTE DEVICE CHECK
Date Time Interrogation Session: 20250319095710
Implantable Pulse Generator Implant Date: 20230524

## 2023-05-01 ENCOUNTER — Ambulatory Visit (HOSPITAL_BASED_OUTPATIENT_CLINIC_OR_DEPARTMENT_OTHER): Payer: Medicare PPO | Admitting: Pulmonary Disease

## 2023-05-12 ENCOUNTER — Ambulatory Visit: Payer: Medicare PPO

## 2023-05-12 DIAGNOSIS — I4819 Other persistent atrial fibrillation: Secondary | ICD-10-CM | POA: Diagnosis not present

## 2023-05-13 DIAGNOSIS — M461 Sacroiliitis, not elsewhere classified: Secondary | ICD-10-CM | POA: Diagnosis not present

## 2023-05-13 LAB — CUP PACEART REMOTE DEVICE CHECK
Date Time Interrogation Session: 20250420231157
Implantable Pulse Generator Implant Date: 20230524

## 2023-05-16 DIAGNOSIS — Z1211 Encounter for screening for malignant neoplasm of colon: Secondary | ICD-10-CM | POA: Diagnosis not present

## 2023-05-20 LAB — COLOGUARD: COLOGUARD: NEGATIVE

## 2023-05-21 NOTE — Progress Notes (Signed)
 Carelink Summary Report / Loop Recorder

## 2023-05-21 NOTE — Addendum Note (Signed)
 Addended by: Edra Govern D on: 05/21/2023 10:52 AM   Modules accepted: Orders

## 2023-05-22 ENCOUNTER — Encounter: Payer: Self-pay | Admitting: Cardiovascular Disease

## 2023-05-26 ENCOUNTER — Encounter (HOSPITAL_BASED_OUTPATIENT_CLINIC_OR_DEPARTMENT_OTHER): Payer: Self-pay | Admitting: Pulmonary Disease

## 2023-05-26 ENCOUNTER — Ambulatory Visit (HOSPITAL_BASED_OUTPATIENT_CLINIC_OR_DEPARTMENT_OTHER): Admitting: Pulmonary Disease

## 2023-05-26 VITALS — BP 130/70 | HR 60 | Ht 69.0 in | Wt 186.0 lb

## 2023-05-26 DIAGNOSIS — I4891 Unspecified atrial fibrillation: Secondary | ICD-10-CM

## 2023-05-26 DIAGNOSIS — R251 Tremor, unspecified: Secondary | ICD-10-CM | POA: Diagnosis not present

## 2023-05-26 DIAGNOSIS — G4733 Obstructive sleep apnea (adult) (pediatric): Secondary | ICD-10-CM

## 2023-05-26 DIAGNOSIS — Z9981 Dependence on supplemental oxygen: Secondary | ICD-10-CM

## 2023-05-26 NOTE — Progress Notes (Signed)
 Subjective:    Patient ID: [name], female    DOB: 1950/02/24, 73 y.o.   MRN: [id number]  HPI  73 yo never smoker, retired Psychiatrist for FU of OSA --persistent dyspnea and Mild restriction on PFTs.   PMH -  atrial fibrillation controlled on Tikosyn . She has restrictive cardiomyopathy with negative cardiac biopsy at [facility] in the past.  Essential tremor following with Neuro (neg aldolase/ck/acetylcholine recept)   -constellation of symptoms including severe weight loss, unexplained dyspnea and weak voice with essential tremors.  Previous work-up for neuromuscular disease has been negative   Chief Complaint  Patient presents with   Follow-up    Feeling tired in the am, progressively worse over the past year.     presents with increased morning fatigue and breathing difficulties.  She experiences increased morning fatigue, described as 'breathing tiredness,' feeling more tired upon waking than at bedtime. Her breathing feels off, particularly in the mornings, with shortness of breath during talking or walking. Fatigue improves as the day progresses, allowing her to walk her dog by 9 AM, though she cannot go as far earlier in the morning.  She uses her CPAP machine regularly, with occasional interruptions due to caring for her dog during thunderstorms. Despite good compliance with the CPAP, she feels exhausted upon waking and finds it difficult to take a deep breath after removing the mask. Her CPAP settings are at a pressure of 14, with a maximum of 15, and her events are down to 2.6 per hour, indicating good control.  She has atrial fibrillation, for which she underwent a double heart ablation in April, which did not fully resolve the issue. She has been in AFib since December 3rd. She experiences some weakness in the mornings, but it is not significant.  DL review : ecxcellent VHQIONGEXB>2.8U/XLKGM, avg pr 14.4 , on auto 5-15 cm    Significant tests/  events reviewed     CPET 03/2016 no clear indication for cardiopulmonary limitation. At peak exercise and with flat O2 pulse pattern, patient is likely limited due to diastolic dysfunction as a result of her body habitus. Expect weight loss and regular exercise would greatly improve exercise intolerance.   PFT 07/2020 very mild restrictive lung disease, normal DLCO?  Extraparenchymal   PFTs 01/2016 showed ratio 72, FVC 74%, FEV1 69%, TLC 91% and DLCO 73% suggesting moderate intraparenchymal restriction sniff test >> no  diaphragmatic dysfunction     CPAP ttiration 01/2021 >> 10 cm  PSG 2008 at [facility] showed total sleep time 362 minutes and a low AHI PSG 02/2016 showed mild OSA with AHI 8/hour   Echo 01/2020 normal LVEF grade 2 diastolic dysfunction, normal RVSP Echo 01/2016 showed grade 2 diastolic dysfunction with RVSP 35   CT cors 02/2018 low risk   02/2016 sniff test normal diaphragmatic function  Review of Systems neg for any significant sore throat, dysphagia, itching, sneezing, nasal congestion or excess/ purulent secretions, fever, chills, sweats, unintended wt loss, pleuritic or exertional cp, hempoptysis, orthopnea pnd or change in chronic leg swelling. Also denies presyncope, palpitations, heartburn, abdominal pain, nausea, vomiting, diarrhea or change in bowel or urinary habits, dysuria,hematuria, rash, arthralgias, visual complaints, headache, numbness weakness or ataxia.     Objective:   Physical Exam  Gen. Pleasant, well-nourished, in no distress ENT - no thrush, no pallor/icterus,no post nasal drip Neck: No JVD, no thyromegaly, no carotid bruits Lungs: no use of accessory muscles, no dullness to percussion, clear without rales or rhonchi  Cardiovascular: Rhythm regular, heart sounds  normal, no murmurs or gallops, no peripheral edema Musculoskeletal: No deformities, no cyanosis or clubbing        Assessment & Plan:    Obstructive Sleep Apnea Obstructive sleep apnea  is well-controlled with CPAP therapy. She reports atypical morning fatigue despite consistent CPAP usage, with an average pressure of 14.4 cm H2O and events reduced to 2.6 per hour. There is a minor mask leak, but overall compliance is good. Inspire therapy is not recommended due to adequate CPAP tolerance and control. Inspire involves an implant with a wire to the tongue muscle, activated only during sleep, and could cause tongue issues. It is reserved for those intolerant to CPAP. - Change CPAP therapy with settings of 10 to 15 cm H2O. - Discuss Inspire therapy only if CPAP becomes intolerable in the future.  Atrial Fibrillation Atrial fibrillation persists despite previous double heart ablation in April. She has been in AFib since December 3rd but remains asymptomatic. She is scheduled to see a cardiologist in a few weeks.  Essential Tremor Essential tremor diagnosed by neurology. She reports no recent follow-up with neurology and no worsening of symptoms. She experiences some morning weakness but no significant neurological decline. Neurology had previously discussed invasive brain procedures, but she deferred due to concurrent health issues. - Consider follow-up with neurology for persistent fatigue if symptoms worsen or if there is a need for further management.

## 2023-05-26 NOTE — Patient Instructions (Signed)
 Change to auto CPAP 10-15 cm

## 2023-05-27 DIAGNOSIS — Z85828 Personal history of other malignant neoplasm of skin: Secondary | ICD-10-CM | POA: Diagnosis not present

## 2023-05-27 DIAGNOSIS — L309 Dermatitis, unspecified: Secondary | ICD-10-CM | POA: Diagnosis not present

## 2023-05-28 DIAGNOSIS — H353132 Nonexudative age-related macular degeneration, bilateral, intermediate dry stage: Secondary | ICD-10-CM | POA: Diagnosis not present

## 2023-05-28 DIAGNOSIS — H35373 Puckering of macula, bilateral: Secondary | ICD-10-CM | POA: Diagnosis not present

## 2023-05-28 DIAGNOSIS — H35363 Drusen (degenerative) of macula, bilateral: Secondary | ICD-10-CM | POA: Diagnosis not present

## 2023-05-28 DIAGNOSIS — H43813 Vitreous degeneration, bilateral: Secondary | ICD-10-CM | POA: Diagnosis not present

## 2023-05-28 DIAGNOSIS — H2513 Age-related nuclear cataract, bilateral: Secondary | ICD-10-CM | POA: Diagnosis not present

## 2023-05-28 DIAGNOSIS — H40012 Open angle with borderline findings, low risk, left eye: Secondary | ICD-10-CM | POA: Diagnosis not present

## 2023-06-17 ENCOUNTER — Ambulatory Visit (INDEPENDENT_AMBULATORY_CARE_PROVIDER_SITE_OTHER): Payer: Medicare PPO

## 2023-06-17 DIAGNOSIS — I4819 Other persistent atrial fibrillation: Secondary | ICD-10-CM

## 2023-06-17 LAB — CUP PACEART REMOTE DEVICE CHECK
Date Time Interrogation Session: 20250526231703
Implantable Pulse Generator Implant Date: 20230524

## 2023-06-22 ENCOUNTER — Encounter: Payer: Self-pay | Admitting: Cardiovascular Disease

## 2023-06-22 NOTE — Progress Notes (Unsigned)
 could go okay  Cardiology Office Note   Date:  06/23/2023   ID:  [name], DOB 20-Aug-1950, MRN [id number]  PCP:  Margarete Sharps, MD  Cardiologist:   Ahmad Alert, MD   Chief Complaint  Patient presents with   Atrial Fibrillation   1. Atrial fibrillation 2. Hyperlipidemia 3. Diastolic congestive heart failure  73 year old female with a history of atrial fibrillation. She's been well controlled on Tikosyn . She also has a history of hyperlipidemia and is on Lipitor. She's been on chronic Pradaxa  therapy for anticoagulation.  February 20, 2012 [name]  has done well since I last saw her. 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 She has occasional palpitations but overall she's doing quite well.  She is exercising ( water aerobics) twice a week.  Feb. 24, 2015:  [name] is doing well.   No problems.  Maintaining NSR.  Sept. 16, 2015:  She was in the ER this past week.  CP, dyspnea .. CT angio was negative for PE - showed ? Of pneumonia.   Was treated wit Abx. Does well in the am's .Aaron Aas Fatigued and perhaps more short of breath in the afternoons.   April 05, 2014: [name] is a 73 y.o. female who presents for follow-up of her atrial fibrillation. She has continued to have some DOE,  Is concerned about the atorvastatin . Was seen in the lipid clinic.    Sept. 30, 2016:  Doing well.  No CP , no dyspnea. Has lost 11 lbs since last year.  Is in NSR  April 10, 2015:  [name] is doing ok from a cardiology standpoint. Has had some dental issues   Sept.  22, 2017:  [name] is seen for follow up of her atrial fib.    No  CP or dyspnea. No further episodes of atrial fib.   Dec. 11, 2017:  [name] is seen today for followup for her atrial fib  Has some DOE  Not exercising as much as she needs to  No CP   Jan. 16, 2018: [name] is seen today as a work in visit at the request of Dr. Mamie Searles. Been having more shortness of breath and was  thought to have worsening congestive heart failure.  Has severe DOE with any exertion. We performed a cardiac cath in 2008 which revealed normal coronaries.   She went to [facility] and had a heart biopsy.   Was not told of any abnormality.  Does have dull chest pain with exertion and with sitting   She had an echocardiogram performed yesterday which revealed normal left ventricular systolic function. She does have grade 2 diastolic dysfunction. Her Lasix  was increased last week.   She has not noticed any increase in urine output.  No change in her breathing .  Has noticed that she loses her breath if she does any exertion .  Cannot lay supine - has severe dyspnea.   April 02, 2016:  [name] is seen today for follow up visit .    Seen with husband , Autry Legions .  She had an episode of PAF yesterday  - lasted for several hours.   Is back in NSR currently .  Had an episode in Feb. That lasted 2-3 hours , HR of 129 ,  She was very short of breath with that.   -Sleep study on Feb. 20 shows mild OSA -PFTs 01/2016 showed ratio 72, FVC 74%, FEV1 69%, TLC 91% and DLCO 73% suggesting moderate  intraparenchymal restriction  - echo shows normal LV systolic function with grade 2 diastolic dysfunction. She has an estimated PA pressure of 35.  Has developed a tremor.   Is seeing a neurologist .   July 10, 2016:  Had a CPX in March , Normal functional capacity compared to sedentary patients.  Still has some vocal issues ( when she gets fatigued, she loses her voice) struggles to talk  Able to get out and work in the garden Using CPAP - may have helped some   Dec. 19, 2018:  [name] is seen today for follow up of her chronic diastolic CHF [name] was [facility] earlier this year.  She had a heart catheterization which revealed normal coronary arteries.  She has had a myocardial biopsy which was reportedly normal. History of paroxysmal atrial fibrillation which seems to be  well-controlled on  Tikosyn   PFTs has shown reduction of DLCO ( 73% pred.)   Is just getting over al cold  Has continued to have issues with vertigo  Not getting out to do any exercise   July 23, 2017:  Doing well,  No CP or dyspnea. Gets short of breath with the heat.  Started getting some exercise, yoga .  No cardiac exercise  Is having some joint issues,     February 02, 2018: He is seen today for follow-up visit.  She has a history of atrial fibrillation. She remains in normal sinus rhythm on Tikosyn .   She had an episode of paroxysmal atrial fibrillation this past Thursday.  The episode lasted for about 3 hours.   She took 1 of [name]'s propranolol  tablets and went back into sinus rhythm fairly quickly.  Her QT intervals have remained good.  Sept. 15, 2020   [name] is seen today . caregiving for [name]'s sister.  Wt. Today is  222 ( previous wt from Jan. 2020 was 241 lbs)   Walking some , eating fewer carbs.   April 13, 2019  [name] is seen today for follow up of her atrial fib and chronic diastilic CHF Wt is 225 lbs.  ( up 3 lbs )  Has chronic diastolic CHF Had a spinal injection last week..   Still has back and leg pain with walking  Has not been able to exercise.     Nov. 16, 2021 [name] is seen today for follow up of her chronic CHF, PAF  Wt today is 225 lbs.   Had an episode of PAF several weeks ago.   Lasted 2 days HR was 130s  Made her fatigued.  Has been walking ,  Had lost some weight, now has gained it back  Her Afib started while she was walking up her driveway  Is on xarelto    August 03, 2020: [name] is seen today for follow-up of her chronic congestive heart failure, paroxysmal atrial fibrillation.  Her weight today is211 lbs.   She was seen by Neomi Banks, NP for worsening shortness of breath.  She has been on Tikosyn  for her atrial fibrillation. Her Lasix  was increased to 80 mg a day for 3 days then she resumed 40 mg p.o. twice daily.  She was last  seen in A. fib clinic in March, 2022.  She had been maintaining sinus rhythm at that time.  Is not able to do much due to severe DOE . Loses her breath with any activity .  Does an activity , then has to sit down Does another activity ,  then has to sit down  Walks the dog for a few minutes - perhaps 30 mintues.  Had CPX in 2018 ;-   Normal coronary CTA in Feb. 2020 Coronary calcium  score is 0   She had a cardiopulmonary stress test in 2018.  It revealed normal functional capacity.  She also had pulmonary function test around 2018.  It revealed mildly reduced diffusion capacity  Voice is normal when she is rested,  has a very difficult time speaking when she is fatigued.  Normal TSH in June, 2022  Echo Jan. 2022:   EF 60-65%.,  Grade 2 diastolic dysfunction. Mild mitral regurgitation.  Saw Neomi Banks , NP on last June.  Trial of acid blockers to see if that would help with her hoarse voice No benefit according to [facility]  April 02, 2021 [name] is seen today for follow up of her chronic diastolic CHF Has mild MR She has a hx of episodic dyspnea and loses her voice regularly  I have encouraged her to discuss this with Dr Mamie Searles and a neurologist  Had a sleep study.  Now has a different CPAP mask ( sees Dr. Villa Greaser )  [facility] is 201 lbs .   April 24, 2021 [name] is seen today for follow up of her chronic diastolic CHF, mild MR She was admitted to the hospital on April 16, 2021 with syncope - occurred at 10 AM .   She had developed atrial fib 2 days prior to her admission.   She had taken 2 propranolol  prior to passing out  The syncope was likely orthostatic hypotension by hx . She had just been to the bathroom, stood up , took several steps  She had eaten breakfast, coffee, juice.  Takes propranolol  10 mg BID ( breakfast and dinner ) for her tremor.   Has had 35 lb weight loss She was out for 45-60 seconds Was in atrial fib.    Was scheduled for TEE /cv but she converted on her  own  She is scheduled to see us  for consideration of implantable loop recorder   Wt is 200 lbs .  She has lost 35 lbs unintentionally . She has discussed with dr. Mamie Searles.  Memory is not very good Having some vertigo issues,  hearing has been an issue  Has seen ENT .  Has a brain MRI in several weeks .   She is bradycardic today.  My plan is to change her propranolol  to just as needed instead of 10 mg twice a day.  Her blood pressure is also on the lower end of normal and she is having orthostatic hypotension.  We will discontinue Lasix  and potassium.  We will continue spironolactone .  Jan 07, 2022  [name] is seen today for follow up of her atrial , chronici diastolic CHF,  syncope   Generalized weakness and shortness of breath   Has had 2 episodes of PAF  Lasting from 1 hour to several hours  HR was not as high as previous episodes  Is associated with more dyspnea.  And more fatigue   Lipid levels from Dr. Bethene Brought office were reviewed   Jan. 27, 2023 Chol = 135 HDL = 54 Trigs = 61 LDL = 68   July 09, 2022 [name] is seen for follow up of her chronic diastolic CHF,  PAF  Has generalized weakness and shortness of breath  Had ablation on April 15 Has had 2 episodes of PAF  Takes propranolol  PRN,   On June  14, she had an episode of PAF ,  took propranolol   HR went down to 40s for most of the day   Will interrogate her loop recorder   Her HR after conversion appeared to be 40-50. Her apple watch suggested prolonged HR in the 40s Implantable look suggest HR was higher   Increased the detection HR limit from 30 to 40 .   She has an appt to see Dr. Nannie Babinski next months  Wt is 187 ( down 8 lbs since Dec. 2023 )    June 23, 2023 [name] is seen for follow up of her PAF, chronic diastolic CHF,  syncope, pre-syncope   Still short of breath with talking . Able to exercise some      Wt Readings from Last 3 Encounters:  06/23/23 189 lb (85.7 kg)  05/26/23 186 lb (84.4  kg)  03/24/23 186 lb (84.4 kg)      Past Medical History:  Diagnosis Date   Atrial fibrillation (HCC)    Back pain    nerve problems-had series of three injections   Bone spur    Spine   Chronic anticoagulation    Diastolic congestive heart failure (HCC)    Hearing loss    History of shingles    Hyperlipemia    Migraine headache    Obstructive sleep apnea    uses CPAP   OSA on CPAP    Paroxysmal atrial fibrillation (HCC)    Restrictive cardiomyopathy (HCC)    Tremor    Tubal pregnancy     Past Surgical History:  Procedure Laterality Date   A-FLUTTER ABLATION N/A 05/06/2022   Procedure: A-FLUTTER ABLATION;  Surgeon: Mealor, Donnamae Gaba, MD;  Location: [facility];  Service: Cardiovascular;  Laterality: N/A;   ATRIAL FIBRILLATION ABLATION N/A 05/06/2022   Procedure: ATRIAL FIBRILLATION ABLATION;  Surgeon: Efraim Grange, MD;  Location: [facility];  Service: Cardiovascular;  Laterality: N/A;   ATRIAL FIBRILLATION ABLATION N/A 12/24/2022   Procedure: ATRIAL FIBRILLATION ABLATION;  Surgeon: Efraim Grange, MD;  Location: [facility];  Service: Cardiovascular;  Laterality: N/A;   BREAST EXCISIONAL BIOPSY Left    benign   CARDIAC CATHETERIZATION     CARDIOVERSION     CHOLECYSTECTOMY     ECTOPIC PREGNANCY SURGERY  1980   EYE SURGERY     left eye injections x2-was having "stroke like" flashes of light   heart biopsy  2008   [facility]   HYSTEROSCOPY  06/2003   and D&C-simple hyperplasia secondary PMP bleeding   implantable loop recorder placement  06/13/2021   Medtronic Reveal Linq model LNQ 22 (SN [id number]) implantable loop recorder implanted for syncope and AF management   ROTATOR CUFF REPAIR     bilateral     Current Outpatient Medications  Medication Sig Dispense Refill   atorvastatin  (LIPITOR) 20 MG tablet TAKE 1 TABLET EVERY DAY AT 6PM 90 tablet 3   Cholecalciferol (VITAMIN D-3 PO) Take 5,000 Units by mouth daily with supper.      Cyanocobalamin  (VITAMIN B-12) 5000 MCG SUBL Take 5,000 mcg by mouth daily with supper.     ezetimibe  (ZETIA ) 10 MG tablet TAKE 1 TABLET EVERY DAY 30 tablet 11   gabapentin  (NEURONTIN ) 600 MG tablet Take 1 tablet (600 mg total) by mouth 3 (three) times daily. 90 tablet 6   Multiple Vitamins-Minerals (PRESERVISION AREDS 2 PO) Take 1 capsule by mouth 2 (two) times daily. With breakfast & with supper  NONFORMULARY OR COMPOUNDED ITEM Place 1 spray into both nostrils 2 (two) times daily as needed (migraine headaches.). Lidocaine  Isotonic Nasal Spray 4% Compounded via [facility]     Polyethyl Glycol-Propyl Glycol (SYSTANE) 0.4-0.3 % SOLN Place 1 drop into both eyes in the morning and at bedtime.     propranolol  (INDERAL ) 10 MG tablet Take 1 tablet (10 mg total) by mouth every 6 (six) hours as needed (for palpitations). (Patient taking differently: Take 10 mg by mouth as needed (for palpitations).)     spironolactone  (ALDACTONE ) 25 MG tablet TAKE 1 TABLET EVERY DAY 90 tablet 3   XARELTO  20 MG TABS tablet TAKE 1 TABLET EVERY DAY WITH SUPPER 90 tablet 3   No current facility-administered medications for this visit.    Allergies:   Oxycodone , Topamax [topiramate], Codeine, Other, Penicillins, and Tramadol    Social History:  The patient  reports that she has never smoked. She has never used smokeless tobacco. She reports that she does not currently use alcohol . She reports that she does not use drugs.   Family History:  [name] family history includes Atrial fibrillation in her mother and sister; Breast cancer in her sister; Cancer in her maternal grandmother and maternal uncle; Dementia in her father and sister; Heart disease in her sister; Hypertension in her father; Seizures in her father; Thyroid  cancer in her sister.    ROS:   As noted in current hx. Otherwise negative    Physical Exam: Blood pressure 119/65, pulse (!) 55, height 5\' 9"  (1.753 m), weight 189 lb (85.7 kg), last  menstrual period 01/21/1990, SpO2 98%.       GEN:  Well nourished, well developed in no acute distress HEENT: Normal NECK: No JVD; No carotid bruits LYMPHATICS: No lymphadenopathy CARDIAC: RRR , soft systolic murmur  RESPIRATORY:  Clear to auscultation without rales, wheezing or rhonchi  ABDOMEN: Soft, non-tender, non-distended MUSCULOSKELETAL:  No edema; No deformity  SKIN: Warm and dry NEUROLOGIC:  Alert and oriented x 3    EKG:     Recent Labs: 08/06/2022: ALT 17 11/26/2022: BUN 14; Hemoglobin 13.0; Platelets 245; Potassium 4.2; Sodium 144 11/28/2022: Creatinine, Ser 0.90 01/21/2023: Magnesium 2.1    Lipid Panel    Component Value Date/Time   CHOL 134 07/23/2017 0847   TRIG 153 (H) 07/23/2017 0847   HDL 40 07/23/2017 0847   CHOLHDL 3.4 07/23/2017 0847   CHOLHDL 4 10/22/2013 1501   VLDL 32.6 10/22/2013 1501   LDLCALC 63 07/23/2017 0847      Wt Readings from Last 3 Encounters:  06/23/23 189 lb (85.7 kg)  05/26/23 186 lb (84.4 kg)  03/24/23 186 lb (84.4 kg)      Other studies Reviewed: Additional studies/ records that were reviewed today include: . Review of the above records demonstrates:    ASSESSMENT AND PLAN:  1. Atrial fibrillation -   stable ,     2. Syncope:       3.  Chest discomfort:  :      4. Diastolic congestive heart failure -   has known diastolic CHF .  Remains very short of breath   We could consider referral to the CHF team if her symptoms worsen     5.  Mild TR :  consider repeat echo in the near future , especially if her symptoms persist or worsen .          Current medicines are reviewed at length with the patient today.  The  patient does not have concerns regarding medicines.  The following changes have been made:  See above.   Labs/ tests ordered today include:   No orders of the defined types were placed in this encounter.    Disposition:       Ahmad Alert, MD  06/23/2023 8:31 AM    [facility] [address] [place], [place], [place]  [address] Phone: [phone]; Fax: [phone]

## 2023-06-23 ENCOUNTER — Ambulatory Visit: Payer: Medicare PPO | Attending: Cardiovascular Disease | Admitting: Cardiovascular Disease

## 2023-06-23 VITALS — BP 119/65 | HR 55 | Ht 69.0 in | Wt 189.0 lb

## 2023-06-23 DIAGNOSIS — I480 Paroxysmal atrial fibrillation: Secondary | ICD-10-CM | POA: Diagnosis not present

## 2023-06-23 DIAGNOSIS — I071 Rheumatic tricuspid insufficiency: Secondary | ICD-10-CM

## 2023-06-23 NOTE — Patient Instructions (Signed)
 Follow-Up: At [facility], you and your health needs are our priority.  As part of our continuing mission to provide you with exceptional heart care, our providers are all part of one team.  This team includes your primary Cardiologist (physician) and Advanced Practice Providers or APPs (Physician Assistants and Nurse Practitioners) who all work together to provide you with the care you need, when you need it.  Your next appointment:   1 year(s)  Provider:   Ahmad Alert, MD

## 2023-06-25 ENCOUNTER — Ambulatory Visit: Payer: Self-pay | Admitting: Cardiovascular Disease

## 2023-06-27 NOTE — Progress Notes (Signed)
 Carelink Summary Report / Loop Recorder

## 2023-06-27 NOTE — Addendum Note (Signed)
 Addended by: Edra Govern D on: 06/27/2023 12:00 PM   Modules accepted: Orders

## 2023-07-07 ENCOUNTER — Other Ambulatory Visit: Payer: Self-pay | Admitting: Cardiovascular Disease

## 2023-07-17 ENCOUNTER — Ambulatory Visit (INDEPENDENT_AMBULATORY_CARE_PROVIDER_SITE_OTHER)

## 2023-07-17 DIAGNOSIS — I483 Typical atrial flutter: Secondary | ICD-10-CM | POA: Diagnosis not present

## 2023-07-17 LAB — CUP PACEART REMOTE DEVICE CHECK
Date Time Interrogation Session: 20250625231537
Implantable Pulse Generator Implant Date: 20230524

## 2023-07-23 ENCOUNTER — Ambulatory Visit: Payer: Self-pay | Admitting: Cardiovascular Disease

## 2023-07-23 DIAGNOSIS — H25013 Cortical age-related cataract, bilateral: Secondary | ICD-10-CM | POA: Diagnosis not present

## 2023-07-23 DIAGNOSIS — H524 Presbyopia: Secondary | ICD-10-CM | POA: Diagnosis not present

## 2023-07-23 DIAGNOSIS — H353132 Nonexudative age-related macular degeneration, bilateral, intermediate dry stage: Secondary | ICD-10-CM | POA: Diagnosis not present

## 2023-07-23 DIAGNOSIS — H35371 Puckering of macula, right eye: Secondary | ICD-10-CM | POA: Diagnosis not present

## 2023-07-23 DIAGNOSIS — H40023 Open angle with borderline findings, high risk, bilateral: Secondary | ICD-10-CM | POA: Diagnosis not present

## 2023-07-23 DIAGNOSIS — H25042 Posterior subcapsular polar age-related cataract, left eye: Secondary | ICD-10-CM | POA: Diagnosis not present

## 2023-07-23 DIAGNOSIS — H2513 Age-related nuclear cataract, bilateral: Secondary | ICD-10-CM | POA: Diagnosis not present

## 2023-08-04 NOTE — Progress Notes (Signed)
 Carelink Summary Report / Loop Recorder

## 2023-08-13 DIAGNOSIS — M461 Sacroiliitis, not elsewhere classified: Secondary | ICD-10-CM | POA: Diagnosis not present

## 2023-08-13 DIAGNOSIS — M47816 Spondylosis without myelopathy or radiculopathy, lumbar region: Secondary | ICD-10-CM | POA: Diagnosis not present

## 2023-08-18 ENCOUNTER — Ambulatory Visit

## 2023-08-18 DIAGNOSIS — H9311 Tinnitus, right ear: Secondary | ICD-10-CM | POA: Diagnosis not present

## 2023-08-18 DIAGNOSIS — H903 Sensorineural hearing loss, bilateral: Secondary | ICD-10-CM | POA: Diagnosis not present

## 2023-08-18 DIAGNOSIS — H93A3 Pulsatile tinnitus, bilateral: Secondary | ICD-10-CM | POA: Diagnosis not present

## 2023-08-18 DIAGNOSIS — I483 Typical atrial flutter: Secondary | ICD-10-CM

## 2023-08-18 DIAGNOSIS — R42 Dizziness and giddiness: Secondary | ICD-10-CM | POA: Diagnosis not present

## 2023-08-20 DIAGNOSIS — H40023 Open angle with borderline findings, high risk, bilateral: Secondary | ICD-10-CM | POA: Diagnosis not present

## 2023-08-20 LAB — CUP PACEART REMOTE DEVICE CHECK
Date Time Interrogation Session: 20250727233758
Implantable Pulse Generator Implant Date: 20230524

## 2023-08-21 ENCOUNTER — Encounter

## 2023-09-09 DIAGNOSIS — M461 Sacroiliitis, not elsewhere classified: Secondary | ICD-10-CM | POA: Diagnosis not present

## 2023-09-15 DIAGNOSIS — I2721 Secondary pulmonary arterial hypertension: Secondary | ICD-10-CM | POA: Diagnosis not present

## 2023-09-15 DIAGNOSIS — I425 Other restrictive cardiomyopathy: Secondary | ICD-10-CM | POA: Diagnosis not present

## 2023-09-15 DIAGNOSIS — E785 Hyperlipidemia, unspecified: Secondary | ICD-10-CM | POA: Diagnosis not present

## 2023-09-15 DIAGNOSIS — I5189 Other ill-defined heart diseases: Secondary | ICD-10-CM | POA: Diagnosis not present

## 2023-09-15 DIAGNOSIS — E669 Obesity, unspecified: Secondary | ICD-10-CM | POA: Diagnosis not present

## 2023-09-15 DIAGNOSIS — E876 Hypokalemia: Secondary | ICD-10-CM | POA: Diagnosis not present

## 2023-09-15 DIAGNOSIS — G2581 Restless legs syndrome: Secondary | ICD-10-CM | POA: Diagnosis not present

## 2023-09-15 DIAGNOSIS — N1831 Chronic kidney disease, stage 3a: Secondary | ICD-10-CM | POA: Diagnosis not present

## 2023-09-15 DIAGNOSIS — R079 Chest pain, unspecified: Secondary | ICD-10-CM | POA: Diagnosis not present

## 2023-09-15 DIAGNOSIS — R0609 Other forms of dyspnea: Secondary | ICD-10-CM | POA: Diagnosis not present

## 2023-09-18 ENCOUNTER — Ambulatory Visit

## 2023-09-18 DIAGNOSIS — I4819 Other persistent atrial fibrillation: Secondary | ICD-10-CM | POA: Diagnosis not present

## 2023-09-18 LAB — CUP PACEART REMOTE DEVICE CHECK
Date Time Interrogation Session: 20250827232240
Implantable Pulse Generator Implant Date: 20230524

## 2023-09-22 ENCOUNTER — Encounter

## 2023-09-24 DIAGNOSIS — H40023 Open angle with borderline findings, high risk, bilateral: Secondary | ICD-10-CM | POA: Diagnosis not present

## 2023-09-25 ENCOUNTER — Ambulatory Visit: Payer: Self-pay | Admitting: Cardiovascular Disease

## 2023-09-25 DIAGNOSIS — G43719 Chronic migraine without aura, intractable, without status migrainosus: Secondary | ICD-10-CM | POA: Diagnosis not present

## 2023-10-01 NOTE — Progress Notes (Signed)
 Carelink Summary Report / Loop Recorder

## 2023-10-02 NOTE — Progress Notes (Signed)
 Remote Loop Recorder Transmission

## 2023-10-20 ENCOUNTER — Ambulatory Visit

## 2023-10-20 DIAGNOSIS — M79672 Pain in left foot: Secondary | ICD-10-CM | POA: Diagnosis not present

## 2023-10-20 DIAGNOSIS — I4819 Other persistent atrial fibrillation: Secondary | ICD-10-CM | POA: Diagnosis not present

## 2023-10-20 DIAGNOSIS — M25572 Pain in left ankle and joints of left foot: Secondary | ICD-10-CM | POA: Diagnosis not present

## 2023-10-20 LAB — CUP PACEART REMOTE DEVICE CHECK
Date Time Interrogation Session: 20250928232557
Implantable Pulse Generator Implant Date: 20230524

## 2023-10-22 NOTE — Progress Notes (Signed)
 Remote Loop Recorder Transmission

## 2023-10-23 ENCOUNTER — Encounter

## 2023-10-28 ENCOUNTER — Ambulatory Visit: Payer: Self-pay | Admitting: Cardiovascular Disease

## 2023-11-03 ENCOUNTER — Other Ambulatory Visit: Payer: Self-pay | Admitting: Pharmacist

## 2023-11-03 DIAGNOSIS — I4819 Other persistent atrial fibrillation: Secondary | ICD-10-CM

## 2023-11-03 MED ORDER — RIVAROXABAN 20 MG PO TABS: 20.0000 mg | ORAL_TABLET | Freq: Every day | ORAL | 1 refills | Status: AC

## 2023-11-03 NOTE — Telephone Encounter (Signed)
 Prescription refill request for Xarelto  received.  Indication: a fib Last office visit: 06/23/23 Weight: 189# Age: 73 Scr: 0.79 epic 11/26/22 CrCl: 86 ml/min

## 2023-11-12 ENCOUNTER — Other Ambulatory Visit: Payer: Self-pay | Admitting: Internal Medicine

## 2023-11-12 DIAGNOSIS — Z1231 Encounter for screening mammogram for malignant neoplasm of breast: Secondary | ICD-10-CM

## 2023-11-13 ENCOUNTER — Ambulatory Visit: Admitting: Podiatry

## 2023-11-13 ENCOUNTER — Ambulatory Visit (INDEPENDENT_AMBULATORY_CARE_PROVIDER_SITE_OTHER)

## 2023-11-13 ENCOUNTER — Encounter: Payer: Self-pay | Admitting: Podiatry

## 2023-11-13 VITALS — Ht 69.0 in | Wt 189.0 lb

## 2023-11-13 DIAGNOSIS — S92512A Displaced fracture of proximal phalanx of left lesser toe(s), initial encounter for closed fracture: Secondary | ICD-10-CM | POA: Diagnosis not present

## 2023-11-13 DIAGNOSIS — M7752 Other enthesopathy of left foot: Secondary | ICD-10-CM

## 2023-11-13 MED ORDER — TRIAMCINOLONE ACETONIDE 0.1 % EX CREA: 1.0000 | TOPICAL_CREAM | Freq: Two times a day (BID) | CUTANEOUS | 0 refills | Status: AC

## 2023-11-13 NOTE — Progress Notes (Unsigned)
 Subjective:   Patient ID: [name], female   DOB: 73 y.o.   MRN: [id number]   HPI Chief Complaint  Patient presents with   Toe Injury    Pt is here due to left toe fracture the the 5th digit states this happen 3 weeks ago while on at cruise, was seen at [facility] ortho, states still some pain to the foot but not as bad as before.   73 year old female presents the office today with concerns of a toe fracture of the left foot which occurred 3 weeks ago while on a cruise.  Patient was seen by [facility] and was placed into a surgical shoe.  She is hoping to get back into a shoe but she still gets some swelling to the toe as well as discomfort.  She does describe some nerve type discomfort as well to the toes.  Even prior to the surgery she was having some pain along the metatarsal heads.   Review of Systems  All other systems reviewed and are negative.   Past Medical History:  Diagnosis Date   Atrial fibrillation (HCC)    Back pain    nerve problems-had series of three injections   Bone spur    Spine   Chronic anticoagulation    Diastolic congestive heart failure (HCC)    Hearing loss    History of shingles    Hyperlipemia    Migraine headache    Obstructive sleep apnea    uses CPAP   OSA on CPAP    Paroxysmal atrial fibrillation (HCC)    Restrictive cardiomyopathy (HCC)    Tremor    Tubal pregnancy     Past Surgical History:  Procedure Laterality Date   A-FLUTTER ABLATION N/A 05/06/2022   Procedure: A-FLUTTER ABLATION;  Surgeon: Mealor, Eulas BRAVO, MD;  [facility];  Service: Cardiovascular;  Laterality: N/A;   ATRIAL FIBRILLATION ABLATION N/A 05/06/2022   Procedure: ATRIAL FIBRILLATION ABLATION;  Surgeon: Nancey Eulas BRAVO, MD;  [facility]: [facility];  Service: Cardiovascular;  Laterality: N/A;   ATRIAL FIBRILLATION ABLATION N/A 12/24/2022   Procedure: ATRIAL FIBRILLATION ABLATION;  Surgeon: Nancey Eulas BRAVO, MD;  Location: [facility]  INVASIVE CV LAB;  Service: Cardiovascular;  Laterality: N/A;   BREAST EXCISIONAL BIOPSY Left    benign   CARDIAC CATHETERIZATION     CARDIOVERSION     CHOLECYSTECTOMY     ECTOPIC PREGNANCY SURGERY  1980   EYE SURGERY     left eye injections x2-was having stroke like flashes of light   heart biopsy  2008   [facility]   HYSTEROSCOPY  06/2003   and D&C-simple hyperplasia secondary PMP bleeding   implantable loop recorder placement  06/13/2021   Medtronic Reveal Linq model LNQ 22 (SN [id number]) implantable loop recorder implanted for syncope and AF management   ROTATOR CUFF REPAIR     bilateral     Current Outpatient Medications:    atorvastatin  (LIPITOR) 20 MG tablet, TAKE 1 TABLET EVERY DAY AT 6PM, Disp: 90 tablet, Rfl: 3   Cholecalciferol (VITAMIN D-3 PO), Take 5,000 Units by mouth daily with supper., Disp: , Rfl:    Cyanocobalamin  (VITAMIN B-12) 5000 MCG SUBL, Take 5,000 mcg by mouth daily with supper., Disp: , Rfl:    ezetimibe  (ZETIA ) 10 MG tablet, TAKE 1 TABLET EVERY DAY, Disp: 90 tablet, Rfl: 3   gabapentin  (NEURONTIN ) 600 MG tablet, Take 1 tablet (600 mg total) by mouth 3 (three) times daily., Disp: 90  tablet, Rfl: 6   Multiple Vitamins-Minerals (PRESERVISION AREDS 2 PO), Take 1 capsule by mouth 2 (two) times daily. With breakfast & with supper, Disp: , Rfl:    NONFORMULARY OR COMPOUNDED ITEM, Place 1 spray into both nostrils 2 (two) times daily as needed (migraine headaches.). Lidocaine  Isotonic Nasal Spray 4% Compounded via [facility], Disp: , Rfl:    Polyethyl Glycol-Propyl Glycol (SYSTANE) 0.4-0.3 % SOLN, Place 1 drop into both eyes in the morning and at bedtime., Disp: , Rfl:    propranolol  (INDERAL ) 10 MG tablet, Take 1 tablet (10 mg total) by mouth every 6 (six) hours as needed (for palpitations). (Patient taking differently: Take 10 mg by mouth as needed (for palpitations).), Disp: , Rfl:    rivaroxaban  (XARELTO ) 20 MG TABS tablet, Take 1 tablet (20 mg total) by  mouth daily with supper., Disp: 90 tablet, Rfl: 1   spironolactone  (ALDACTONE ) 25 MG tablet, TAKE 1 TABLET EVERY DAY, Disp: 90 tablet, Rfl: 3   triamcinolone  cream (KENALOG) 0.1 %, Apply 1 Application topically 2 (two) times daily., Disp: 30 g, Rfl: 0  Allergies  Allergen Reactions   Oxycodone  Hives    Hives    Topamax [Topiramate] Other (See Comments)    hallucinations    Codeine Nausea And Vomiting    VERTIGO   Other Other (See Comments)    Adhesive-prolonged exposure causes skin redness/irritation.   Penicillins Rash   Tramadol     dizziness         Objective:  Physical Exam  General: AAO x3, NAD  Dermatological: Skin is warm, dry and supple bilateral. There are no open sores, no preulcerative lesions, no rash or signs of infection present.  Vascular: Dorsalis Pedis artery and Posterior Tibial artery pedal pulses are 2/4 bilateral with immedate capillary fill time. There is no pain with calf compression, swelling, warmth, erythema.   Neruologic: Grossly intact via light touch bilateral.   Musculoskeletal: Edema noted to the lesser toes most notably the 4th and 5th toes.  No erythema or warmth.  She does get tenderness about associated toes as well.  She has some pain on metatarsals.  Not able to palpate a neuroma which they were asking about today.       Assessment:   73 year old female toe fracture     Plan:  -Treatment options discussed including all alternatives, risks, and complications -Etiology of symptoms were discussed - X-rays were obtained and reviewed.  Multiple views obtained.  Healing fracture noted on the fifth proximal phalanx.  I will have any old x-rays to compare but does appear to be some consolidation noted. -Given x-ray findings that she still having some discomfort would recommend remain in the surgical shoe for now. Discussed that as she starts to improve and not having any pain then she can gradually transition to a shoe with buddy splinting.   She says buddy splint is uncomfortable but I showed her how to tape the fourth toe to help decrease the swelling. -Prior to the injury she was having submetatarsal pain.  Difficult to evaluate today given the recent injury.  Once the toe fracture is resolved and she not have any pain from that we can further evaluate for neuromas or other issues that she was having prior to the injury.  Return in about 4 weeks (around 12/11/2023) for toe fracture, x-ray.  Donnice JONELLE Fees DPM

## 2023-11-18 ENCOUNTER — Encounter: Payer: Self-pay | Admitting: *Deleted

## 2023-11-19 ENCOUNTER — Ambulatory Visit: Attending: Cardiology | Admitting: Internal Medicine

## 2023-11-19 VITALS — BP 118/64 | HR 51 | Ht 69.0 in | Wt 191.6 lb

## 2023-11-19 DIAGNOSIS — Z9889 Other specified postprocedural states: Secondary | ICD-10-CM

## 2023-11-19 DIAGNOSIS — R0609 Other forms of dyspnea: Secondary | ICD-10-CM

## 2023-11-19 DIAGNOSIS — G250 Essential tremor: Secondary | ICD-10-CM | POA: Diagnosis not present

## 2023-11-19 DIAGNOSIS — I4819 Other persistent atrial fibrillation: Secondary | ICD-10-CM

## 2023-11-19 DIAGNOSIS — Z8679 Personal history of other diseases of the circulatory system: Secondary | ICD-10-CM | POA: Diagnosis not present

## 2023-11-19 DIAGNOSIS — D6869 Other thrombophilia: Secondary | ICD-10-CM | POA: Diagnosis not present

## 2023-11-19 DIAGNOSIS — R5383 Other fatigue: Secondary | ICD-10-CM

## 2023-11-19 DIAGNOSIS — G4733 Obstructive sleep apnea (adult) (pediatric): Secondary | ICD-10-CM

## 2023-11-19 DIAGNOSIS — E785 Hyperlipidemia, unspecified: Secondary | ICD-10-CM | POA: Diagnosis not present

## 2023-11-19 NOTE — Progress Notes (Signed)
 Cardiology Office Note:  .   Date:  11/19/2023  ID:  [name], DOB [birth year], MRN [id number] PCP: Onita Rush, MD  [facility] Providers Cardiologist:  Soyla DELENA Merck, MD Electrophysiologist:  Eulas FORBES Furbish, MD    History of Present Illness: .   [name] is a 73 y.o. female.  Discussed the use of AI scribe software for clinical note transcription with the patient, who gave verbal consent to proceed.  History of Present Illness [name] is a 73 year old female with atrial fibrillation and diastolic heart failure who presents with shortness of breath and fatigue.  She underwent an ablation procedure on December 24, 2022, for atrial fibrillation. Since then, she has not had significant recurrences, with only mild episodes in January 2025. She discontinued Tikosyn  in March 2025 in shared decision making with Dr. Furbish and is currently on Xarelto  20 mg daily.  Shortness of breath began after the ablation and has worsened over the past year, now occurring with minimal activity. She describes difficulty catching her breath and experiences associated fatigue. She uses a CPAP machine for sleep apnea but feels exhausted upon waking and struggles to take a deep breath after removing the mask. Despite good compliance, she perceives no improvement. She denies any smoking history.  She manages hyperlipidemia with Lipitor 20 mg daily and Zetia  10 mg daily. Spironolactone  25 mg daily effectively controls her blood pressure. She occasionally experiences dizziness when standing up quickly but denies major issues.    ROS: negative except per HPI above.  Studies Reviewed: SABRA   EKG Interpretation Date/Time:  Wednesday November 19 2023 08:28:06 EDT Ventricular Rate:  52 PR Interval:  144 QRS Duration:  74 QT Interval:  404 QTC Calculation: 375 R Axis:   64  Text Interpretation: Sinus bradycardia When compared with ECG of 24-Mar-2023 13:50, No  significant change was found Confirmed by Merck Soyla ([id number]) on 11/19/2023 8:41:06 AM    Results LABS Thyroid  function: Normal (02/2023)  RADIOLOGY Coronary CT: Calcium  score of 0 (12/2022) Risk Assessment/Calculations:      Physical Exam:   VS:  BP 118/64 (BP Location: Left Arm, Patient Position: Sitting, Cuff Size: Normal)   Pulse (!) 51   Ht 5' 9 (1.753 m)   Wt 191 lb 9.6 oz (86.9 kg)   LMP 01/21/1990   SpO2 99%   BMI 28.29 kg/m    Wt Readings from Last 3 Encounters:  11/19/23 191 lb 9.6 oz (86.9 kg)  11/13/23 189 lb (85.7 kg)  06/23/23 189 lb (85.7 kg)     Physical Exam GENERAL: Alert, cooperative, well developed, no acute distress. HEENT: Normocephalic, normal oropharynx, moist mucous membranes. CHEST: Clear to auscultation bilaterally, no wheezes, rhonchi, or crackles. CARDIOVASCULAR: Normal heart rate and rhythm, S1 and S2 normal with a slight systolic early peaking murmur, 1/6. ABDOMEN: Soft, non-tender, non-distended, without organomegaly, normal bowel sounds. EXTREMITIES: No cyanosis or edema. NEUROLOGICAL: Cranial nerves grossly intact, moves all extremities without gross motor or sensory deficit.   ASSESSMENT AND PLAN: .    Assessment and Plan Assessment & Plan Fatigue and exertional dyspnea Fatigue and dyspnea post-ablation with shortness of breath on exertion. Differential includes cardiac causes. Low risk for significant coronary artery disease based on previous CT calcium  score 0. - Order echocardiogram to assess heart function and pulmonary pressures. - Order coronary CT angiogram to evaluate for coronary artery disease. - Check thyroid  function with TSH. - Schedule blood work to assess kidney  function before CT scan.  Diastolic heart failure Symptoms may be related to diastolic heart failure. - Order echocardiogram to assess diastolic function.  Atrial fibrillation, status post ablation No recurrence of atrial fibrillation post-ablation.  Currently in normal sinus rhythm. - Continue Xarelto  20 mg daily for anticoagulation.  Obstructive sleep apnea Managed with CPAP. Reports exhaustion despite compliance. CPAP settings recently adjusted. - Continue CPAP therapy with current settings.  Hyperlipidemia Well-controlled with current medication regimen. Cholesterol levels within target range. - Continue atorvastatin  20 mg daily. - Continue Zetia  10 mg daily.  Essential tremor Reports tremor, possibly related to essential tremor. - Consider propranolol  for tremor management if symptoms become bothersome.   Recording duration: 27 minutes  I spent 40 minutes in the care of TYRONDA VIZCARRONDO today including reviewing labs (TSH, lytes), reviewing studies (CT pulm vein 11/2022, echo, loop recorder interrogations), face to face time discussing treatment options (27 min), and documenting in the encounter.      Soyla Merck, MD, FACC

## 2023-11-19 NOTE — Patient Instructions (Addendum)
 Medication Instructions:  No Changes No pre meds needed for Coronary CT    Lab Work: Today: Non fasting BMET (checks electrolytes and kidney function) and TSH (Thyroid  Stimulating Hormone)  If you have labs (blood work) drawn today and your tests are completely normal, you will receive your results only by: MyChart Message (if you have MyChart) OR A paper copy in the mail If you have any lab test that is abnormal or we need to change your treatment, we will call you to review the results.  Testing/Procedures:  Your physician has requested that you have an echocardiogram. Echocardiography is a painless test that uses sound waves to create images of your heart. It provides your doctor with information about the size and shape of your heart and how well your heart's chambers and valves are working. This procedure takes approximately one hour. There are no restrictions for this procedure. Please do NOT wear cologne, perfume, aftershave, or lotions (deodorant is allowed). Please arrive 15 minutes prior to your appointment time.  Please note: We ask at that you not bring children with you during ultrasound (echo/ vascular) testing. Due to room size and safety concerns, children are not allowed in the ultrasound rooms during exams. Our front office staff cannot provide observation of children in our lobby area while testing is being conducted. An adult accompanying a patient to their appointment will only be allowed in the ultrasound room at the discretion of the ultrasound technician under special circumstances. We apologize for any inconvenience.   -*-*-*-*-*-*-*-*-*-*-*-*-*-*-*-*-*-*-*-*-*-*-*-*-*-*-*-* Your cardiac CT will be scheduled at one of the below locations:   [facility] [address] [place], [place] [address] [phone] (Severe contrast allergies only)  OR   [facility] [address]  [place], [place] [address]  If scheduled at  [facility], please arrive at the [facility] and Children's Entrance (Entrance C2) of [facility] 30 minutes prior to test start time. You can use the FREE valet parking offered at entrance C (encouraged to control the heart rate for the test)  Proceed to the [facility] Radiology Department (first floor) to check-in and test prep.  All radiology patients and guests should use entrance C2 at [facility], accessed from [facility], even though the hospital's physical address listed is [address].  If scheduled at the Heart and Vascular Tower at [address], please enter the parking lot using the [address] entrance and use the FREE valet service at the patient drop-off area. Enter the building and check-in with registration on the main floor.   Please follow these instructions carefully (unless otherwise directed):  An IV will be required for this test and Nitroglycerin  will be given.  Hold all erectile dysfunction medications at least 3 days (72 hrs) prior to test. (Ie viagra, cialis, sildenafil, tadalafil, etc)   On the Night Before the Test: Be sure to Drink plenty of water. Do not consume any caffeinated/decaffeinated beverages or chocolate 12 hours prior to your test. Do not take any antihistamines 12 hours prior to your test.   On the Day of the Test: Drink plenty of water until 1 hour prior to the test. Do not eat any food 1 hour prior to test. You may take your regular medications prior to the test.  If you take Furosemide /Hydrochlorothiazide/Spironolactone /Chlorthalidone, please HOLD on the morning of the test. Patients who wear a continuous glucose monitor MUST remove the device prior to scanning.  FEMALES- please wear underwire-free bra if available, avoid dresses & tight clothing       After the Test: Drink plenty of water. After receiving IV contrast, you may experience a mild flushed feeling. This is normal. On occasion,  you may experience a mild rash up to 24 hours after the test. This is not dangerous. If this occurs, you can take Benadryl 25 mg, Zyrtec, Claritin, or Allegra and increase your fluid intake. (Patients taking Tikosyn  should avoid Benadryl, and may take Zyrtec, Claritin, or Allegra) If you experience trouble breathing, this can be serious. If it is severe call 911 IMMEDIATELY. If it is mild, please call our office.  We will call to schedule your test 2-4 weeks out understanding that some insurance companies will need an authorization prior to the service being performed.   For more information and frequently asked questions, please visit our website : [email or web address]  For non-scheduling related questions, please contact the cardiac imaging nurse navigator should you have any questions/concerns: Cardiac Imaging Nurse Navigators Direct Office Dial: [phone]   For scheduling needs, including cancellations and rescheduling, please call Brittany, [phone].   Follow-Up: At [facility], you and your health needs are our priority.  As part of our continuing mission to provide you with exceptional heart care, our providers are all part of one team.  This team includes your primary Cardiologist (physician) and Advanced Practice Providers or APPs (Physician Assistants and Nurse Practitioners) who all work together to provide you with the care you need, when you need it.  Your next appointment:   4-6 week(s)  Provider:   Soyla DELENA Merck, MD or Hao Meng, PA or Callie Goodrich, [place]     Other Instructions Please call us  or send a MyChart message with any Cardiology related questions/concerns.  [phone].  Thank you!

## 2023-11-20 ENCOUNTER — Ambulatory Visit

## 2023-11-20 ENCOUNTER — Ambulatory Visit: Payer: Self-pay | Admitting: Internal Medicine

## 2023-11-20 DIAGNOSIS — I4819 Other persistent atrial fibrillation: Secondary | ICD-10-CM

## 2023-11-20 LAB — CUP PACEART REMOTE DEVICE CHECK
Date Time Interrogation Session: 20251029231840
Implantable Pulse Generator Implant Date: 20230524

## 2023-11-20 LAB — TSH: TSH: 2.02 u[IU]/mL (ref 0.450–4.500)

## 2023-11-20 LAB — BASIC METABOLIC PANEL WITH GFR
BUN/Creatinine Ratio: 20 (ref 12–28)
BUN: 16 mg/dL (ref 8–27)
CO2: 21 mmol/L (ref 20–29)
Calcium: 9.9 mg/dL (ref 8.7–10.3)
Chloride: 104 mmol/L (ref 96–106)
Creatinine, Ser: 0.79 mg/dL (ref 0.57–1.00)
Glucose: 90 mg/dL (ref 70–99)
Potassium: 4.5 mmol/L (ref 3.5–5.2)
Sodium: 145 mmol/L — ABNORMAL HIGH (ref 134–144)
eGFR: 79 mL/min/1.73 (ref >59)

## 2023-11-24 ENCOUNTER — Encounter

## 2023-11-24 ENCOUNTER — Ambulatory Visit (HOSPITAL_COMMUNITY)
Admission: RE | Admit: 2023-11-24 | Discharge: 2023-11-24 | Disposition: A | Discharge status: Home / Self Care | Source: Ambulatory Visit | Attending: Cardiovascular Disease | Admitting: Cardiovascular Disease

## 2023-11-24 DIAGNOSIS — I4819 Other persistent atrial fibrillation: Secondary | ICD-10-CM | POA: Insufficient documentation

## 2023-11-24 DIAGNOSIS — R0609 Other forms of dyspnea: Secondary | ICD-10-CM | POA: Diagnosis not present

## 2023-11-24 LAB — ECHOCARDIOGRAM COMPLETE
Area-P 1/2: 3.03 cm2
S' Lateral: 2.9 cm

## 2023-11-26 NOTE — Progress Notes (Signed)
 Remote Loop Recorder Transmission

## 2023-11-27 ENCOUNTER — Ambulatory Visit: Payer: Self-pay | Admitting: Cardiovascular Disease

## 2023-11-28 ENCOUNTER — Encounter (HOSPITAL_COMMUNITY): Payer: Self-pay

## 2023-11-28 DIAGNOSIS — H43813 Vitreous degeneration, bilateral: Secondary | ICD-10-CM | POA: Diagnosis not present

## 2023-11-28 DIAGNOSIS — H35373 Puckering of macula, bilateral: Secondary | ICD-10-CM | POA: Diagnosis not present

## 2023-11-28 DIAGNOSIS — H2513 Age-related nuclear cataract, bilateral: Secondary | ICD-10-CM | POA: Diagnosis not present

## 2023-11-28 DIAGNOSIS — H401122 Primary open-angle glaucoma, left eye, moderate stage: Secondary | ICD-10-CM | POA: Diagnosis not present

## 2023-11-28 DIAGNOSIS — H59813 Chorioretinal scars after surgery for detachment, bilateral: Secondary | ICD-10-CM | POA: Diagnosis not present

## 2023-11-28 DIAGNOSIS — H35723 Serous detachment of retinal pigment epithelium, bilateral: Secondary | ICD-10-CM | POA: Diagnosis not present

## 2023-11-28 DIAGNOSIS — H401111 Primary open-angle glaucoma, right eye, mild stage: Secondary | ICD-10-CM | POA: Diagnosis not present

## 2023-11-28 DIAGNOSIS — H35363 Drusen (degenerative) of macula, bilateral: Secondary | ICD-10-CM | POA: Diagnosis not present

## 2023-11-28 DIAGNOSIS — H353132 Nonexudative age-related macular degeneration, bilateral, intermediate dry stage: Secondary | ICD-10-CM | POA: Diagnosis not present

## 2023-12-02 ENCOUNTER — Ambulatory Visit (HOSPITAL_COMMUNITY)
Admission: RE | Admit: 2023-12-02 | Discharge: 2023-12-02 | Disposition: A | Discharge status: Home / Self Care | Source: Ambulatory Visit | Attending: Cardiovascular Disease | Admitting: Cardiovascular Disease

## 2023-12-02 DIAGNOSIS — R079 Chest pain, unspecified: Secondary | ICD-10-CM | POA: Insufficient documentation

## 2023-12-02 DIAGNOSIS — R0609 Other forms of dyspnea: Secondary | ICD-10-CM | POA: Insufficient documentation

## 2023-12-02 DIAGNOSIS — R9430 Abnormal result of cardiovascular function study, unspecified: Secondary | ICD-10-CM

## 2023-12-02 MED ORDER — NITROGLYCERIN 0.4 MG SL SUBL
0.8000 mg | SUBLINGUAL_TABLET | Freq: Once | SUBLINGUAL | Status: AC
  Administered 2023-12-02: 0.8 mg via SUBLINGUAL

## 2023-12-02 MED ORDER — IOHEXOL 350 MG/ML SOLN
100.0000 mL | Freq: Once | INTRAVENOUS | Status: AC | PRN
  Administered 2023-12-02: 100 mL via INTRAVENOUS

## 2023-12-08 DIAGNOSIS — Z780 Asymptomatic menopausal state: Secondary | ICD-10-CM | POA: Diagnosis not present

## 2023-12-09 ENCOUNTER — Ambulatory Visit
Admission: RE | Admit: 2023-12-09 | Discharge: 2023-12-09 | Disposition: A | Discharge status: Home / Self Care | Source: Ambulatory Visit | Attending: Internal Medicine | Admitting: Internal Medicine

## 2023-12-09 DIAGNOSIS — Z1231 Encounter for screening mammogram for malignant neoplasm of breast: Secondary | ICD-10-CM

## 2023-12-11 ENCOUNTER — Ambulatory Visit: Admitting: Podiatry

## 2023-12-11 ENCOUNTER — Other Ambulatory Visit: Payer: Self-pay | Admitting: Internal Medicine

## 2023-12-11 ENCOUNTER — Ambulatory Visit

## 2023-12-11 ENCOUNTER — Encounter: Payer: Self-pay | Admitting: Podiatry

## 2023-12-11 VITALS — Ht 69.0 in | Wt 191.6 lb

## 2023-12-11 DIAGNOSIS — S92512A Displaced fracture of proximal phalanx of left lesser toe(s), initial encounter for closed fracture: Secondary | ICD-10-CM | POA: Diagnosis not present

## 2023-12-11 DIAGNOSIS — S92512D Displaced fracture of proximal phalanx of left lesser toe(s), subsequent encounter for fracture with routine healing: Secondary | ICD-10-CM

## 2023-12-11 DIAGNOSIS — S90122A Contusion of left lesser toe(s) without damage to nail, initial encounter: Secondary | ICD-10-CM | POA: Diagnosis not present

## 2023-12-11 DIAGNOSIS — M461 Sacroiliitis, not elsewhere classified: Secondary | ICD-10-CM | POA: Diagnosis not present

## 2023-12-11 DIAGNOSIS — R928 Other abnormal and inconclusive findings on diagnostic imaging of breast: Secondary | ICD-10-CM

## 2023-12-11 NOTE — Progress Notes (Signed)
 Subjective:   Patient ID: [name], female   DOB: 73 y.o.   MRN: [id number]   HPI Chief Complaint  Patient presents with   Toe Injury    Pt is here to f/u on left foot due to 5th toe fracture, states yesterday she hit the 2nd toe on a door yesterday and the toe is bruised and swollen and hurts.    72 year old female presents today for evaluation of left fifth toe fracture.  Some and some soreness that she thinks is coming from where the boot is rubbing inside of her foot.  She has a new injury that occurred yesterday when she had her second toe incident swollen and bruised.  No open lesions.  She is remain in the surgical shoe.  Review of Systems  All other systems reviewed and are negative.      Objective:  Physical Exam  General: AAO x3, NAD  Dermatological: Skin is warm, dry and supple bilateral. There are no open sores, no preulcerative lesions, no rash or signs of infection present.  Vascular: Dorsalis Pedis artery and Posterior Tibial artery pedal pulses are 2/4 bilateral with immedate capillary fill time. There is no pain with calf compression, swelling, warmth, erythema.   Neruologic: Grossly intact via light touch bilateral.   Musculoskeletal: There is also mild edema present to the left fifth toe but there is no edema and bruising present to the second toe.  Mild tenderness palpation on the fifth MPJ but tenderness of the second toe today.  There is no other areas of pinpoint tenderness identified at this time.       Assessment:   73 year old female toe fracture; contusion     Plan:  -Treatment options discussed including all alternatives, risks, and complications -Etiology of symptoms were discussed - X-rays were obtained and reviewed.  Multiple views obtained.  Healing fracture noted on the fifth proximal phalanx.  Compared to prior x-rays no significant changes to the second digit. -Discussed placement to the second toe.  Treatment surgical shoe for  now but discussed that she feels more comfortable she can gradually start to transition back to regular shoe as tolerated.  Continue ice, elevate. -Symptoms of not improved in the next couple weeks let me know or send there is any worsening or changes.  If there is any worsening we will repeat x-rays, or if no improvement next couple weeks.  No follow-ups on file.  Donnice JONELLE Fees DPM

## 2023-12-15 ENCOUNTER — Ambulatory Visit: Attending: Physician Assistant | Admitting: Physician Assistant

## 2023-12-15 VITALS — BP 118/76 | HR 56 | Ht 69.0 in | Wt 194.2 lb

## 2023-12-15 DIAGNOSIS — Z9889 Other specified postprocedural states: Secondary | ICD-10-CM | POA: Diagnosis not present

## 2023-12-15 DIAGNOSIS — Z8679 Personal history of other diseases of the circulatory system: Secondary | ICD-10-CM | POA: Diagnosis not present

## 2023-12-15 DIAGNOSIS — E785 Hyperlipidemia, unspecified: Secondary | ICD-10-CM

## 2023-12-15 DIAGNOSIS — R0609 Other forms of dyspnea: Secondary | ICD-10-CM | POA: Diagnosis not present

## 2023-12-15 DIAGNOSIS — G4733 Obstructive sleep apnea (adult) (pediatric): Secondary | ICD-10-CM

## 2023-12-15 NOTE — Progress Notes (Unsigned)
 Cardiology Office Note   Date:  12/16/2023  ID:  LA SHEHAN, DOB 1951-01-18, MRN [id number] PCP: Onita Rush, MD  [facility] Providers Cardiologist:  Soyla DELENA Merck, MD Electrophysiologist:  Eulas FORBES Furbish, MD     History of Present Illness DAYLEE DELAHOZ is a 73 y.o. female with PMH of atrial fibrillation s/p ablation 12/24/2022, diastolic heart failure, OSA on CPAP, essential tremor and hyperlipidemia.  Since her ablation procedure, she has not had any significant recurrence of A-fib.  She discontinued Tikosyn  in March 2025 after discussing with Dr. Cleotilde.  She remains on Xarelto  20 mg daily.  Patient developed her shortness of breath after the ablation, symptom worsened over the past year and then now occurs with minimal activity.  She feels exhausted despite using CPAP machine for sleep apnea and struggles to take deep breath after removing the mask.  Patient was recently seen by Dr. Merck who recommended a coronary CTA and echocardiogram.  Blood work including TSH and a basic metabolic panel were normal.  Coronary CTA obtained on 12/02/2023 showed a coronary calcium  score of 0 which placed the patient at 0 percentile for age and sex matched control, normal coronary arteries, no significant extracardiac abnormality.  Echocardiogram obtained on 11/24/2023 showed EF 60 to 65%, no regional wall motion abnormality, RVSP 37.8 mmHg, mild dilatation of the ascending aorta measurement of 38 mm.  Recent loop recorder interrogation showed no significant recurrent A-fib.  Patient presents today for follow-up.  She continues to complain of exhaustion waking up in the morning and also shortness of breath after walking a certain distance.  We ambulated in the hallway for 3 laps, O2 saturation remained at 98%, heart rate did go up from the 50s up to 90 bpm.  She has been compliant with CPAP therapy.  We reviewed the recent echocardiogram and coronary CTA.  Although there was no CBC  in our system, according to her husband, she did have a CBC at her PCPs office which was normal.  At this time, we do not recommend any further workup.  She has a follow-up with Dr. Merck in 6 months.    ROS:   Patient complains of dyspnea on exertion but no chest pain.  She has no lower extremity edema orthopnea or PND.  Studies Reviewed      Cardiac Studies & Procedures   ______________________________________________________________________________________________   STRESS TESTS  MYOCARDIAL PERFUSION IMAGING 02/15/2016  Interpretation Summary  Nuclear stress EF: 75%.  The left ventricular ejection fraction is hyperdynamic (>65%).  There was no ST segment deviation noted during stress.  Defect 1: There is a small defect present in the apical anterior location.  This is a low risk study.  Normal pharmacologic nuclear stress test. No ischemia. There is a small fixed defect in the apical wall most probably sec to shifting breast artifact.   ECHOCARDIOGRAM  ECHOCARDIOGRAM COMPLETE 11/24/2023  Narrative ECHOCARDIOGRAM REPORT    Patient Name:   Smt G Zelman Date of Exam: 11/24/2023 Medical Rec #:  [id number]           Height:       69.0 in Accession #:    [id number]          Weight:       191.6 lb Date of Birth:  Jul 29, 1950           BSA:          2.029 m Patient Age:    26 years  BP:           118/64 mmHg Patient Gender: F                   HR:           60 bpm. Exam Location:  [address]  Procedure: 2D Echo, Cardiac Doppler and Color Doppler (Both Spectral and Color Flow Doppler were utilized during procedure).  Indications:    148.19 Atrial fibrillation, R06.09 DOE  History:        Patient has prior history of Echocardiogram examinations, most recent 04/17/2021. Risk Factors:Sleep Apnea and Dyslipidemia.  Sonographer:    Powell Saras Referring Phys: [id number] SOYLA A ACHARYA  IMPRESSIONS   1. Left ventricular ejection fraction, by  estimation, is 60 to 65%. The left ventricle has normal function. The left ventricle has no regional wall motion abnormalities. Left ventricular diastolic parameters were normal. 2. Right ventricular systolic function is normal. The right ventricular size is mildly enlarged. There is mildly elevated pulmonary artery systolic pressure. The estimated right ventricular systolic pressure is 37.8 mmHg. 3. Left atrial size was mildly dilated. 4. Right atrial size was mildly dilated. 5. The mitral valve is degenerative. Trivial mitral valve regurgitation. No evidence of mitral stenosis. 6. The aortic valve is tricuspid. Aortic valve regurgitation is not visualized. Aortic valve sclerosis is present, with no evidence of aortic valve stenosis. 7. Aortic dilatation noted. There is mild dilatation of the ascending aorta, measuring 38 mm. 8. The inferior vena cava is normal in size with greater than 50% respiratory variability, suggesting right atrial pressure of 3 mmHg.  FINDINGS Left Ventricle: Left ventricular ejection fraction, by estimation, is 60 to 65%. The left ventricle has normal function. The left ventricle has no regional wall motion abnormalities. The left ventricular internal cavity size was normal in size. There is no left ventricular hypertrophy. Left ventricular diastolic parameters were normal. Normal left ventricular filling pressure.  Right Ventricle: The right ventricular size is mildly enlarged. No increase in right ventricular wall thickness. Right ventricular systolic function is normal. There is mildly elevated pulmonary artery systolic pressure. The tricuspid regurgitant velocity is 2.73 m/s, and with an assumed right atrial pressure of 8 mmHg, the estimated right ventricular systolic pressure is 37.8 mmHg.  Left Atrium: Left atrial size was mildly dilated.  Right Atrium: Right atrial size was mildly dilated.  Pericardium: There is no evidence of pericardial effusion.  Mitral  Valve: The mitral valve is degenerative in appearance. There is mild calcification of the mitral valve leaflet(s). Mild mitral annular calcification. Trivial mitral valve regurgitation. No evidence of mitral valve stenosis.  Tricuspid Valve: The tricuspid valve is normal in structure. Tricuspid valve regurgitation is mild . No evidence of tricuspid stenosis.  Aortic Valve: The aortic valve is tricuspid. Aortic valve regurgitation is not visualized. Aortic valve sclerosis is present, with no evidence of aortic valve stenosis.  Pulmonic Valve: The pulmonic valve was normal in structure. Pulmonic valve regurgitation is trivial. No evidence of pulmonic stenosis.  Aorta: Aortic dilatation noted. There is mild dilatation of the ascending aorta, measuring 38 mm.  Venous: The inferior vena cava is normal in size with greater than 50% respiratory variability, suggesting right atrial pressure of 3 mmHg.  IAS/Shunts: No atrial level shunt detected by color flow Doppler.   LEFT VENTRICLE PLAX 2D LVIDd:         4.50 cm   Diastology LVIDs:         2.90 cm  LV e' medial:    9.75 cm/s LV PW:         0.80 cm   LV E/e' medial:  9.7 LV IVS:        0.80 cm   LV e' lateral:   10.10 cm/s LVOT diam:     2.00 cm   LV E/e' lateral: 9.4 LV SV:         88 LV SV Index:   44 LVOT Area:     3.14 cm   RIGHT VENTRICLE             IVC RV Basal diam:  4.00 cm     IVC diam: 2.10 cm RV S prime:     14.90 cm/s TAPSE (M-mode): 3.1 cm  LEFT ATRIUM             Index        RIGHT ATRIUM           Index LA diam:        4.50 cm 2.22 cm/m   RA Area:     20.30 cm LA Vol (A2C):   71.9 ml 35.44 ml/m  RA Volume:   57.10 ml  28.15 ml/m LA Vol (A4C):   68.8 ml 33.91 ml/m LA Biplane Vol: 70.6 ml 34.80 ml/m AORTIC VALVE LVOT Vmax:   136.00 cm/s LVOT Vmean:  84.400 cm/s LVOT VTI:    0.281 m  AORTA Ao Root diam: 3.10 cm Ao Asc diam:  3.80 cm  MITRAL VALVE               TRICUSPID VALVE MV Area (PHT): 3.03 cm    TR  Peak grad:   29.8 mmHg MV Decel Time: 250 msec    TR Vmax:        273.00 cm/s MV E velocity: 94.60 cm/s MV A velocity: 28.90 cm/s  SHUNTS MV E/A ratio:  3.27        Systemic VTI:  0.28 m Systemic Diam: 2.00 cm  Wilbert Bihari MD Electronically signed by Wilbert Bihari MD Signature Date/Time: 11/24/2023/11:19:47 AM    Final    MONITORS  LONG TERM MONITOR (3-14 DAYS) 11/01/2020  Narrative  Sinus rhythm  Rare episodes of atrial tachycardia  No atrial fib observed.   Patch Wear Time:  14 days and 0 hours (2022-09-22T16:45:34-0400 to 2022-10-06T16:45:38-0400)  Patient had a min HR of 39 bpm, max HR of 193 bpm, and avg HR of 56 bpm. Predominant underlying rhythm was Sinus Rhythm. 79 Supraventricular Tachycardia runs occurred, the run with the fastest interval lasting 6 beats with a max rate of 193 bpm, the longest lasting 13.9 secs with an avg rate of 146 bpm. Isolated SVEs were rare (<1.0%), SVE Couplets were rare (<1.0%), and SVE Triplets were rare (<1.0%). Isolated VEs were rare (<1.0%), VE Couplets were rare (<1.0%), and no VE Triplets were present.   CT SCANS  CT CORONARY MORPH W/CTA COR W/SCORE 12/02/2023  Addendum 12/07/2023  9:19 PM ADDENDUM REPORT: 12/07/2023 21:16  EXAM: OVER-READ INTERPRETATION  CT CHEST  The following report is an over-read performed by radiologist Dr. Kate Freiberg [facility] on 12/07/2023. This over-read does not include interpretation of cardiac or coronary anatomy or pathology. The CT heart morphology and CTA calcium  score interpretation by the cardiologist is attached.  COMPARISON:  CT heart 11/28/2022.  FINDINGS: Visualized portions of the lungs are clear.  No lymphadenopathy.  No acute upper abdominal abnormality.  No acute osseous abnormality.  No acute  soft tissue abnormality.  IMPRESSION: No acute abnormality.   Electronically Signed By: Morgane  Naveau M.D. On: 12/07/2023 21:16  Narrative CLINICAL  DATA:  Chest pain  EXAM: Cardiac/Coronary CTA  TECHNIQUE: A non-contrast, gated CT scan was obtained with axial slices of 3 mm through the heart for calcium  scoring. Calcium  scoring was performed using the Agatston method. A 120 kV prospective, gated, contrast cardiac scan was obtained. Gantry rotation speed was 250 msecs and collimation was 0.6 mm. Two sublingual nitroglycerin  tablets (0.8 mg) were given. The 3D data set was reconstructed in 5% intervals of the 35-75% of the R-R cycle. Diastolic phases were analyzed on a dedicated workstation using MPR, MIP, and VRT modes. The patient received 95 cc of contrast.  FINDINGS: Image quality: Excellent.  Noise artifact is: Limited.  Coronary Arteries:  Normal coronary origin.  Right dominance.  Left main: The left main is a large caliber vessel with a normal take off from the left coronary cusp that trifurcates into a LAD, LCX, and ramus intermedius. There is no plaque or stenosis.  Left anterior descending artery: The LAD is patent without evidence of plaque or stenosis. The LAD gives off 2 patent diagonal branches.  Ramus intermedius: Patent with no evidence of plaque or stenosis.  Left circumflex artery: The LCX is non-dominant and patent with no evidence of plaque or stenosis. The LCX gives off 2 patent obtuse marginal branches.  Right coronary artery: The RCA is dominant with normal take off from the right coronary cusp. There is no evidence of plaque or stenosis. The RCA terminates as a PDA and right posterolateral branch without evidence of plaque or stenosis.  Right Atrium: Right atrial size is within normal limits.  Right Ventricle: The right ventricular cavity is within normal limits.  Left Atrium: Left atrial size is normal in size with no left atrial appendage filling defect.  Left Ventricle: The ventricular cavity size is within normal limits.  Pulmonary arteries: Normal in size.  Pulmonary veins: Normal  pulmonary venous drainage.  Pericardium: Normal thickness without significant effusion or calcium  present.  Cardiac valves: The aortic valve is trileaflet without significant calcification. The mitral valve is normal without significant calcification.  Aorta: Normal caliber without significant disease.  Extra-cardiac findings: See attached radiology report for non-cardiac structures.  IMPRESSION: 1. Coronary calcium  score of 0. This was 0 percentile for age-, sex, and race-matched controls.  2. Normal coronary origin with right dominance.  3. Normal coronary arteries.  4. Consider non atherosclerotic causes of chest pain.  RECOMMENDATIONS: 1. CAD-RADS 0: No evidence of CAD (0%). Consider non-atherosclerotic causes of chest pain.  2. CAD-RADS 1: Minimal non-obstructive CAD (0-24%). Consider non-atherosclerotic causes of chest pain. Consider preventive therapy and risk factor modification.  3. CAD-RADS 2: Mild non-obstructive CAD (25-49%). Consider non-atherosclerotic causes of chest pain. Consider preventive therapy and risk factor modification.  4. CAD-RADS 3: Moderate stenosis. Consider symptom-guided anti-ischemic pharmacotherapy as well as risk factor modification per guideline directed care. Additional analysis with CT FFR will be submitted.  5. CAD-RADS 4: Severe stenosis. (70-99% or > 50% left main). Cardiac catheterization or CT FFR is recommended. Consider symptom-guided anti-ischemic pharmacotherapy as well as risk factor modification per guideline directed care. Invasive coronary angiography recommended with revascularization per published guideline statements.  6. CAD-RADS 5: Total coronary occlusion (100%). Consider cardiac catheterization or viability assessment. Consider symptom-guided anti-ischemic pharmacotherapy as well as risk factor modification per guideline directed care.  7. CAD-RADS N: Non-diagnostic study. Obstructive CAD can't  be excluded. Alternative evaluation is recommended.  Wilbert Bihari, MD  Electronically Signed: By: Wilbert Bihari M.D. On: 12/02/2023 20:59   CT SCANS  CT CORONARY MORPH W/CTA COR W/SCORE 02/25/2018  Addendum 02/25/2018  1:32 PM ADDENDUM REPORT: 02/25/2018 13:29  CLINICAL DATA:  Chest pain  EXAM: Cardiac CTA  MEDICATIONS: Sub lingual nitro. 4mg  x 2  TECHNIQUE: The patient was scanned on a Siemens 192 slice scanner. Gantry rotation speed was 250 msecs. Collimation was 0.6 mm. A 100 kV prospective scan was triggered in the ascending thoracic aorta at 35-75% of the R-R interval. Average HR during the scan was 60 bpm. The 3D data set was interpreted on a dedicated work station using MPR, MIP and VRT modes. A total of 80cc of contrast was used.  FINDINGS: Non-cardiac: See separate report from [facility].  Pulmonary veins drain normally to the left atrium.  Calcium  Score: 0 Agatston units.  Coronary Arteries: Right dominant with no anomalies  LM: No plaque or stenosis.  LAD system:  No plaque or stenosis.  Circumflex system: Moderate ramus. No plaque or stenosis in the LCx system.  RCA system: No plaque or stenosis.  IMPRESSION: 1. Coronary artery calcium  score 0 Agatston units, suggesting low risk for future cardiac events.  2.  No significant coronary disease noted.  Dalton Mclean   Electronically Signed By: Ezra Shuck M.D. On: 02/25/2018 13:29  Narrative EXAM: OVER-READ INTERPRETATION  CT CHEST  The following report is an over-read performed by radiologist Dr. Toribio Aye of [facility] on 02/25/2018. This over-read does not include interpretation of cardiac or coronary anatomy or pathology. The coronary calcium  score/coronary CTA interpretation by the cardiologist is attached.  COMPARISON:  Chest CT 09/30/2013.  FINDINGS: Within the visualized portions of the thorax there are no suspicious appearing pulmonary nodules  or masses, there is no acute consolidative airspace disease, no pleural effusions, no pneumothorax and no lymphadenopathy. Visualized portions of the upper abdomen are unremarkable. There are no aggressive appearing lytic or blastic lesions noted in the visualized portions of the skeleton.  IMPRESSION: No significant incidental noncardiac findings are noted.  Electronically Signed: By: Toribio Aye M.D. On: 02/25/2018 10:16     ______________________________________________________________________________________________      Risk Assessment/Calculations           Physical Exam VS:  BP 118/76 (BP Location: Left Arm, Patient Position: Sitting, Cuff Size: Normal)   Pulse (!) 56   Ht 5' 9 (1.753 m)   Wt 194 lb 3.2 oz (88.1 kg)   LMP 01/21/1990   SpO2 99%   BMI 28.68 kg/m        Wt Readings from Last 3 Encounters:  12/15/23 194 lb 3.2 oz (88.1 kg)  12/11/23 191 lb 9.6 oz (86.9 kg)  11/19/23 191 lb 9.6 oz (86.9 kg)    GEN: Well nourished, well developed in no acute distress NECK: No JVD; No carotid bruits CARDIAC: RRR, no murmurs, rubs, gallops RESPIRATORY:  Clear to auscultation without rales, wheezing or rhonchi  ABDOMEN: Soft, non-tender, non-distended EXTREMITIES:  No edema; No deformity   ASSESSMENT AND PLAN  Dyspnea on exertion: Symptom has been going on since ablation.  Recent coronary CTA showed 0 calcium  and a normal coronary arteries.  Echocardiogram was reassuring.  RVSP was mildly elevated, however this was reviewed by Dr. Loni who felt it could be falsely elevated.  We ambulated the patient in the hallway 3 laps, O2 saturation was 98%, heart rate response was normal  History of atrial fibrillation ablation: Recent loop recorder shows no significant recurrent A-fib.  On Xarelto   Hyperlipidemia: On Zetia .  OSA: Patient has been compliant with CPAP therapy.       Dispo: Follow-up in 75-month  Signed, Freddi Schrager, [place]

## 2023-12-15 NOTE — Patient Instructions (Signed)
 Medication Instructions:   Your physician recommends that you continue on your current medications as directed. Please refer to the Current Medication list given to you today.  *If you need a refill on your cardiac medications before your next appointment, please call your pharmacy*    Follow-Up: At [facility], you and your health needs are our priority.  As part of our continuing mission to provide you with exceptional heart care, our providers are all part of one team.  This team includes your primary Cardiologist (physician) and Advanced Practice Providers or APPs (Physician Assistants and Nurse Practitioners) who all work together to provide you with the care you need, when you need it.  Your next appointment:   6 month(s)  Provider:   Gayatri A Acharya, MD

## 2023-12-21 ENCOUNTER — Ambulatory Visit: Attending: Cardiovascular Disease

## 2023-12-21 DIAGNOSIS — I4819 Other persistent atrial fibrillation: Secondary | ICD-10-CM

## 2023-12-22 ENCOUNTER — Encounter

## 2023-12-22 LAB — CUP PACEART REMOTE DEVICE CHECK
Date Time Interrogation Session: 20251129232223
Implantable Pulse Generator Implant Date: 20230524

## 2023-12-25 ENCOUNTER — Encounter

## 2023-12-25 ENCOUNTER — Ambulatory Visit: Payer: Self-pay | Admitting: Cardiovascular Disease

## 2023-12-25 DIAGNOSIS — M461 Sacroiliitis, not elsewhere classified: Secondary | ICD-10-CM | POA: Diagnosis not present

## 2023-12-25 NOTE — Progress Notes (Signed)
 Remote Loop Recorder Transmission

## 2023-12-26 ENCOUNTER — Ambulatory Visit
Admission: RE | Admit: 2023-12-26 | Discharge: 2023-12-26 | Disposition: A | Discharge status: Home / Self Care | Source: Ambulatory Visit | Attending: Internal Medicine | Admitting: Internal Medicine

## 2023-12-26 ENCOUNTER — Ambulatory Visit
Admission: RE | Admit: 2023-12-26 | Discharge: 2023-12-26 | Disposition: A | Source: Ambulatory Visit | Attending: Internal Medicine | Admitting: Internal Medicine

## 2023-12-26 DIAGNOSIS — R928 Other abnormal and inconclusive findings on diagnostic imaging of breast: Secondary | ICD-10-CM

## 2023-12-26 DIAGNOSIS — N6489 Other specified disorders of breast: Secondary | ICD-10-CM | POA: Diagnosis not present

## 2024-01-16 ENCOUNTER — Telehealth: Payer: Self-pay | Admitting: *Deleted

## 2024-01-21 ENCOUNTER — Ambulatory Visit

## 2024-01-21 DIAGNOSIS — I4819 Other persistent atrial fibrillation: Secondary | ICD-10-CM | POA: Diagnosis not present

## 2024-01-26 LAB — CUP PACEART REMOTE DEVICE CHECK
Date Time Interrogation Session: 20260102120303
Implantable Pulse Generator Implant Date: 20230524

## 2024-01-27 NOTE — Progress Notes (Signed)
 Remote Loop Recorder Transmission

## 2024-01-31 ENCOUNTER — Ambulatory Visit: Payer: Self-pay | Admitting: Cardiovascular Disease

## 2024-02-21 ENCOUNTER — Ambulatory Visit: Attending: Cardiovascular Disease

## 2024-02-21 DIAGNOSIS — I4819 Other persistent atrial fibrillation: Secondary | ICD-10-CM

## 2024-02-23 LAB — CUP PACEART REMOTE DEVICE CHECK
Date Time Interrogation Session: 20260130231111
Implantable Pulse Generator Implant Date: 20230524

## 2024-02-27 NOTE — Progress Notes (Signed)
 Remote Loop Recorder Transmission

## 2024-03-23 ENCOUNTER — Ambulatory Visit

## 2024-04-23 ENCOUNTER — Ambulatory Visit

## 2024-05-24 ENCOUNTER — Ambulatory Visit

## 2024-06-24 ENCOUNTER — Ambulatory Visit

## 2024-07-25 ENCOUNTER — Ambulatory Visit

## 2024-08-25 ENCOUNTER — Ambulatory Visit

## 2024-09-25 ENCOUNTER — Ambulatory Visit

## 2024-10-26 ENCOUNTER — Ambulatory Visit

## 2024-11-26 ENCOUNTER — Ambulatory Visit

## 2024-12-27 ENCOUNTER — Ambulatory Visit

## 2025-01-27 ENCOUNTER — Ambulatory Visit
